# Patient Record
Sex: Male | Born: 1940
Health system: Southern US, Community
[De-identification: ages and names within clinical notes are randomized; demographics above are authoritative.]

## PROBLEM LIST (undated history)

## (undated) ENCOUNTER — Emergency Department (HOSPITAL_COMMUNITY): Disposition: A | Discharge status: Home / Self Care | Payer: Medicare HMO

## (undated) DIAGNOSIS — N323 Diverticulum of bladder: Secondary | ICD-10-CM

## (undated) DIAGNOSIS — R945 Abnormal results of liver function studies: Secondary | ICD-10-CM

## (undated) DIAGNOSIS — R7989 Other specified abnormal findings of blood chemistry: Secondary | ICD-10-CM

## (undated) DIAGNOSIS — N400 Enlarged prostate without lower urinary tract symptoms: Secondary | ICD-10-CM

## (undated) DIAGNOSIS — K760 Fatty (change of) liver, not elsewhere classified: Secondary | ICD-10-CM

## (undated) DIAGNOSIS — I10 Essential (primary) hypertension: Secondary | ICD-10-CM

## (undated) DIAGNOSIS — N529 Male erectile dysfunction, unspecified: Secondary | ICD-10-CM

## (undated) DIAGNOSIS — C439 Malignant melanoma of skin, unspecified: Secondary | ICD-10-CM

## (undated) DIAGNOSIS — R319 Hematuria, unspecified: Secondary | ICD-10-CM

## (undated) DIAGNOSIS — F1020 Alcohol dependence, uncomplicated: Secondary | ICD-10-CM

## (undated) DIAGNOSIS — L719 Rosacea, unspecified: Secondary | ICD-10-CM

## (undated) DIAGNOSIS — Z86010 Personal history of colonic polyps: Secondary | ICD-10-CM

## (undated) DIAGNOSIS — I4821 Permanent atrial fibrillation: Secondary | ICD-10-CM

## (undated) DIAGNOSIS — Z8719 Personal history of other diseases of the digestive system: Secondary | ICD-10-CM

## (undated) DIAGNOSIS — Z8673 Personal history of transient ischemic attack (TIA), and cerebral infarction without residual deficits: Secondary | ICD-10-CM

## (undated) DIAGNOSIS — I499 Cardiac arrhythmia, unspecified: Secondary | ICD-10-CM

## (undated) DIAGNOSIS — K7030 Alcoholic cirrhosis of liver without ascites: Secondary | ICD-10-CM

## (undated) DIAGNOSIS — K8020 Calculus of gallbladder without cholecystitis without obstruction: Secondary | ICD-10-CM

## (undated) DIAGNOSIS — E785 Hyperlipidemia, unspecified: Secondary | ICD-10-CM

## (undated) DIAGNOSIS — D649 Anemia, unspecified: Secondary | ICD-10-CM

## (undated) DIAGNOSIS — K769 Liver disease, unspecified: Secondary | ICD-10-CM

## (undated) DIAGNOSIS — Z7901 Long term (current) use of anticoagulants: Secondary | ICD-10-CM

## (undated) DIAGNOSIS — M1990 Unspecified osteoarthritis, unspecified site: Secondary | ICD-10-CM

## (undated) HISTORY — DX: Essential (primary) hypertension: I10

## (undated) HISTORY — DX: Calculus of gallbladder without cholecystitis without obstruction: K80.20

## (undated) HISTORY — DX: Liver disease, unspecified: K76.9

## (undated) HISTORY — DX: Alcoholic cirrhosis of liver without ascites: K70.30

## (undated) HISTORY — DX: Malignant melanoma of skin, unspecified: C43.9

## (undated) HISTORY — DX: Personal history of colonic polyps: Z86.010

## (undated) HISTORY — DX: Alcohol dependence, uncomplicated: F10.20

## (undated) HISTORY — DX: Hyperlipidemia, unspecified: E78.5

---

## 1998-09-19 ENCOUNTER — Ambulatory Visit (HOSPITAL_COMMUNITY): Admission: RE | Admit: 1998-09-19 | Discharge: 1998-09-19 | Payer: Self-pay | Admitting: Cardiology

## 1998-09-19 ENCOUNTER — Encounter: Payer: Self-pay | Admitting: Cardiology

## 1998-09-19 HISTORY — PX: CARDIAC CATHETERIZATION: SHX172

## 2001-01-09 ENCOUNTER — Encounter: Payer: Self-pay | Admitting: Gastroenterology

## 2001-01-13 ENCOUNTER — Encounter: Payer: Self-pay | Admitting: *Deleted

## 2001-01-13 ENCOUNTER — Encounter: Payer: Self-pay | Admitting: Gastroenterology

## 2001-01-13 ENCOUNTER — Encounter: Admission: RE | Admit: 2001-01-13 | Discharge: 2001-01-13 | Payer: Self-pay | Admitting: *Deleted

## 2001-01-14 ENCOUNTER — Ambulatory Visit (HOSPITAL_BASED_OUTPATIENT_CLINIC_OR_DEPARTMENT_OTHER): Admission: RE | Admit: 2001-01-14 | Discharge: 2001-01-14 | Payer: Self-pay | Admitting: *Deleted

## 2001-01-14 HISTORY — PX: INGUINAL HERNIA REPAIR: SHX194

## 2001-01-21 ENCOUNTER — Encounter: Payer: Self-pay | Admitting: Gastroenterology

## 2004-08-24 ENCOUNTER — Ambulatory Visit: Payer: Self-pay | Admitting: Internal Medicine

## 2004-10-02 ENCOUNTER — Ambulatory Visit: Payer: Self-pay | Admitting: Endocrinology

## 2004-11-01 ENCOUNTER — Ambulatory Visit: Payer: Self-pay | Admitting: Endocrinology

## 2004-11-23 ENCOUNTER — Ambulatory Visit: Payer: Self-pay | Admitting: Endocrinology

## 2005-01-22 ENCOUNTER — Ambulatory Visit: Payer: Self-pay | Admitting: Endocrinology

## 2006-02-21 ENCOUNTER — Ambulatory Visit: Payer: Self-pay | Admitting: Endocrinology

## 2006-02-27 ENCOUNTER — Ambulatory Visit: Payer: Self-pay | Admitting: Endocrinology

## 2006-03-21 ENCOUNTER — Ambulatory Visit: Payer: Self-pay | Admitting: Internal Medicine

## 2006-09-03 HISTORY — PX: CATARACT EXTRACTION W/ INTRAOCULAR LENS  IMPLANT, BILATERAL: SHX1307

## 2006-11-06 ENCOUNTER — Ambulatory Visit: Payer: Self-pay | Admitting: Gastroenterology

## 2006-11-06 LAB — CONVERTED CEMR LAB
AFP-Tumor Marker: 3.9 ng/mL (ref 0.0–8.0)
Anti Nuclear Antibody(ANA): NEGATIVE
Hepatitis B Surface Ag: NEGATIVE

## 2006-11-12 ENCOUNTER — Encounter (INDEPENDENT_AMBULATORY_CARE_PROVIDER_SITE_OTHER): Payer: Self-pay | Admitting: Gastroenterology

## 2006-11-12 ENCOUNTER — Ambulatory Visit (HOSPITAL_COMMUNITY): Admission: RE | Admit: 2006-11-12 | Discharge: 2006-11-12 | Payer: Self-pay | Admitting: Gastroenterology

## 2006-11-13 ENCOUNTER — Ambulatory Visit: Payer: Self-pay | Admitting: Gastroenterology

## 2006-11-13 LAB — CONVERTED CEMR LAB
AST: 132 units/L — ABNORMAL HIGH (ref 0–37)
Albumin: 4.1 g/dL (ref 3.5–5.2)
Prothrombin Time: 12.4 s (ref 10.0–14.0)

## 2006-11-20 ENCOUNTER — Encounter (INDEPENDENT_AMBULATORY_CARE_PROVIDER_SITE_OTHER): Payer: Self-pay | Admitting: Specialist

## 2006-11-20 ENCOUNTER — Ambulatory Visit (HOSPITAL_COMMUNITY): Admission: RE | Admit: 2006-11-20 | Discharge: 2006-11-20 | Payer: Self-pay | Admitting: Gastroenterology

## 2006-11-21 ENCOUNTER — Encounter (INDEPENDENT_AMBULATORY_CARE_PROVIDER_SITE_OTHER): Payer: Self-pay | Admitting: Gastroenterology

## 2006-12-06 ENCOUNTER — Ambulatory Visit: Payer: Self-pay | Admitting: Gastroenterology

## 2006-12-27 ENCOUNTER — Ambulatory Visit: Payer: Self-pay | Admitting: Gastroenterology

## 2007-05-14 ENCOUNTER — Encounter: Payer: Self-pay | Admitting: Endocrinology

## 2007-05-14 DIAGNOSIS — M109 Gout, unspecified: Secondary | ICD-10-CM | POA: Insufficient documentation

## 2007-05-14 DIAGNOSIS — I4891 Unspecified atrial fibrillation: Secondary | ICD-10-CM | POA: Insufficient documentation

## 2007-05-15 ENCOUNTER — Ambulatory Visit: Payer: Self-pay | Admitting: Endocrinology

## 2007-05-15 LAB — CONVERTED CEMR LAB
Albumin: 4.4 g/dL (ref 3.5–5.2)
Alkaline Phosphatase: 49 units/L (ref 39–117)
BUN: 11 mg/dL (ref 6–23)
Bacteria, UA: NEGATIVE
Basophils Relative: 0.9 % (ref 0.0–1.0)
Cholesterol: 226 mg/dL (ref 0–200)
Creatinine, Ser: 0.8 mg/dL (ref 0.4–1.5)
Crystals: NEGATIVE
Direct LDL: 166.4 mg/dL
GFR calc non Af Amer: 103 mL/min
HDL: 58 mg/dL (ref >39.0)
Monocytes Relative: 10.9 % (ref 3.0–11.0)
PSA: 1.35 ng/mL (ref 0.10–4.00)
Platelets: 213 10*3/uL (ref 150–400)
Potassium: 4.1 meq/L (ref 3.5–5.1)
RBC / HPF: NONE SEEN
RDW: 13.2 % (ref 11.5–14.6)
Specific Gravity, Urine: 1.015 (ref 1.000–1.03)
TSH: 1.78 microintl units/mL (ref 0.35–5.50)
Total Bilirubin: 1.3 mg/dL — ABNORMAL HIGH (ref 0.3–1.2)
Total CHOL/HDL Ratio: 3.9
Urine Glucose: NEGATIVE mg/dL
Urobilinogen, UA: 4 — AB (ref 0.0–1.0)
VLDL: 14 mg/dL (ref 0–40)
pH: 7.5 (ref 5.0–8.0)

## 2007-05-27 ENCOUNTER — Encounter: Payer: Self-pay | Admitting: Endocrinology

## 2007-05-27 LAB — CONVERTED CEMR LAB
Catecholamines Tot(E+NE) 24 Hr U: 0.043 mg/24hr
Epinephrine 24 Hr Urine: 3 mcg/24hr (ref <20)
Metanephrines, Ur: 87 (ref 26–230)

## 2007-07-10 ENCOUNTER — Ambulatory Visit: Payer: Self-pay | Admitting: Internal Medicine

## 2007-08-06 ENCOUNTER — Ambulatory Visit: Payer: Self-pay | Admitting: Internal Medicine

## 2007-08-06 DIAGNOSIS — F1020 Alcohol dependence, uncomplicated: Secondary | ICD-10-CM | POA: Insufficient documentation

## 2007-08-06 DIAGNOSIS — F1010 Alcohol abuse, uncomplicated: Secondary | ICD-10-CM

## 2007-08-06 DIAGNOSIS — E785 Hyperlipidemia, unspecified: Secondary | ICD-10-CM | POA: Insufficient documentation

## 2007-08-06 DIAGNOSIS — R1013 Epigastric pain: Secondary | ICD-10-CM | POA: Insufficient documentation

## 2007-08-06 DIAGNOSIS — I10 Essential (primary) hypertension: Secondary | ICD-10-CM | POA: Insufficient documentation

## 2007-08-08 ENCOUNTER — Ambulatory Visit: Payer: Self-pay | Admitting: Internal Medicine

## 2007-08-09 ENCOUNTER — Ambulatory Visit: Payer: Self-pay | Admitting: Family Medicine

## 2007-08-09 DIAGNOSIS — J189 Pneumonia, unspecified organism: Secondary | ICD-10-CM | POA: Insufficient documentation

## 2007-08-13 ENCOUNTER — Ambulatory Visit: Payer: Self-pay | Admitting: Internal Medicine

## 2008-01-05 ENCOUNTER — Encounter: Payer: Self-pay | Admitting: Endocrinology

## 2008-06-03 ENCOUNTER — Ambulatory Visit: Payer: Self-pay | Admitting: Endocrinology

## 2008-06-03 DIAGNOSIS — L259 Unspecified contact dermatitis, unspecified cause: Secondary | ICD-10-CM | POA: Insufficient documentation

## 2008-06-04 LAB — CONVERTED CEMR LAB
Alkaline Phosphatase: 53 units/L (ref 39–117)
Bilirubin, Direct: 0.3 mg/dL (ref 0.0–0.3)
Calcium: 9.6 mg/dL (ref 8.4–10.5)
Cholesterol: 260 mg/dL (ref 0–200)
Crystals: NEGATIVE
Eosinophils Relative: 2.1 % (ref 0.0–5.0)
GFR calc Af Amer: 109 mL/min
Glucose, Bld: 107 mg/dL — ABNORMAL HIGH (ref 70–99)
HDL: 59.7 mg/dL (ref >39.0)
Hemoglobin, Urine: NEGATIVE
Monocytes Relative: 8.9 % (ref 3.0–12.0)
Neutrophils Relative %: 69.2 % (ref 43.0–77.0)
Nitrite: NEGATIVE
PSA: 1.45 ng/mL (ref 0.10–4.00)
Platelets: 220 10*3/uL (ref 150–400)
Potassium: 4.2 meq/L (ref 3.5–5.1)
RBC / HPF: NONE SEEN
RDW: 13.1 % (ref 11.5–14.6)
Sodium: 141 meq/L (ref 135–145)
Specific Gravity, Urine: 1.015 (ref 1.000–1.03)
TSH: 1.7 microintl units/mL (ref 0.35–5.50)
Total Bilirubin: 1.7 mg/dL — ABNORMAL HIGH (ref 0.3–1.2)
Total CHOL/HDL Ratio: 4.4
Total Protein: 7.7 g/dL (ref 6.0–8.3)
Triglycerides: 112 mg/dL (ref 0–149)
Uric Acid, Serum: 3.7 mg/dL — ABNORMAL LOW (ref 4.0–7.8)
Urine Glucose: NEGATIVE mg/dL
Urobilinogen, UA: 1 (ref 0.0–1.0)
VLDL: 22 mg/dL (ref 0–40)
WBC, UA: NONE SEEN cells/hpf
WBC: 6.5 10*3/uL (ref 4.5–10.5)

## 2008-06-07 ENCOUNTER — Encounter: Payer: Self-pay | Admitting: Endocrinology

## 2008-06-08 ENCOUNTER — Encounter: Payer: Self-pay | Admitting: Endocrinology

## 2008-07-01 ENCOUNTER — Ambulatory Visit: Payer: Self-pay | Admitting: Gastroenterology

## 2008-07-12 ENCOUNTER — Encounter: Payer: Self-pay | Admitting: Gastroenterology

## 2008-07-12 ENCOUNTER — Ambulatory Visit: Payer: Self-pay | Admitting: Gastroenterology

## 2008-07-12 DIAGNOSIS — Z860100 Personal history of colon polyps, unspecified: Secondary | ICD-10-CM

## 2008-07-12 HISTORY — DX: Personal history of colonic polyps: Z86.010

## 2008-07-12 HISTORY — DX: Personal history of colon polyps, unspecified: Z86.0100

## 2008-07-13 ENCOUNTER — Encounter: Payer: Self-pay | Admitting: Gastroenterology

## 2008-08-23 ENCOUNTER — Encounter: Payer: Self-pay | Admitting: Gastroenterology

## 2008-09-13 ENCOUNTER — Telehealth: Payer: Self-pay | Admitting: Gastroenterology

## 2008-09-16 ENCOUNTER — Ambulatory Visit: Payer: Self-pay | Admitting: Internal Medicine

## 2008-09-21 ENCOUNTER — Ambulatory Visit (HOSPITAL_COMMUNITY): Admission: RE | Admit: 2008-09-21 | Discharge: 2008-09-21 | Payer: Self-pay | Admitting: Gastroenterology

## 2008-09-24 ENCOUNTER — Encounter: Payer: Self-pay | Admitting: Gastroenterology

## 2008-10-06 ENCOUNTER — Telehealth: Payer: Self-pay | Admitting: Gastroenterology

## 2008-10-06 ENCOUNTER — Telehealth (INDEPENDENT_AMBULATORY_CARE_PROVIDER_SITE_OTHER): Payer: Self-pay | Admitting: *Deleted

## 2008-10-06 DIAGNOSIS — Z860100 Personal history of colon polyps, unspecified: Secondary | ICD-10-CM | POA: Insufficient documentation

## 2008-10-06 DIAGNOSIS — K649 Unspecified hemorrhoids: Secondary | ICD-10-CM | POA: Insufficient documentation

## 2008-10-07 ENCOUNTER — Encounter: Payer: Self-pay | Admitting: Endocrinology

## 2008-10-07 ENCOUNTER — Ambulatory Visit: Payer: Self-pay | Admitting: Gastroenterology

## 2008-10-07 DIAGNOSIS — R1032 Left lower quadrant pain: Secondary | ICD-10-CM | POA: Insufficient documentation

## 2008-10-07 DIAGNOSIS — K4190 Unilateral femoral hernia, without obstruction or gangrene, not specified as recurrent: Secondary | ICD-10-CM | POA: Insufficient documentation

## 2008-10-07 LAB — CONVERTED CEMR LAB
ALT: 275 units/L — ABNORMAL HIGH (ref 0–53)
AST: 296 units/L — ABNORMAL HIGH (ref 0–37)
Albumin: 3.9 g/dL (ref 3.5–5.2)
Alkaline Phosphatase: 88 units/L (ref 39–117)
Ammonia: 46 umol/L — ABNORMAL HIGH (ref 11–35)
BUN: 17 mg/dL (ref 6–23)
Chloride: 93 meq/L — ABNORMAL LOW (ref 96–112)
Creatinine, Ser: 1.2 mg/dL (ref 0.4–1.5)
Eosinophils Relative: 0 % (ref 0.0–5.0)
Ferritin: >1500 ng/mL — ABNORMAL HIGH (ref 22.0–322.0)
Folate: 9.2 ng/mL
GFR calc non Af Amer: 64 mL/min
Glucose, Bld: 144 mg/dL — ABNORMAL HIGH (ref 70–99)
INR: 1.4 — ABNORMAL HIGH (ref 0.8–1.0)
Lymphocytes Relative: 2.4 % — ABNORMAL LOW (ref 12.0–46.0)
Monocytes Absolute: 0.4 10*3/uL (ref 0.1–1.0)
Monocytes Relative: 3.7 % (ref 3.0–12.0)
Neutrophils Relative %: 93.9 % — ABNORMAL HIGH (ref 43.0–77.0)
Platelets: 134 10*3/uL — ABNORMAL LOW (ref 150–400)
Potassium: 3.6 meq/L (ref 3.5–5.1)
Prothrombin Time: 14.6 s — ABNORMAL HIGH (ref 10.9–13.3)
RDW: 13.3 % (ref 11.5–14.6)
Sed Rate: 20 mm/hr — ABNORMAL HIGH (ref 0–16)
Sodium: 134 meq/L — ABNORMAL LOW (ref 135–145)
TSH: 1.13 microintl units/mL (ref 0.35–5.50)
WBC: 11.5 10*3/uL — ABNORMAL HIGH (ref 4.5–10.5)

## 2008-10-08 ENCOUNTER — Ambulatory Visit: Payer: Self-pay | Admitting: Gastroenterology

## 2008-10-08 ENCOUNTER — Inpatient Hospital Stay (HOSPITAL_COMMUNITY): Admission: AD | Admit: 2008-10-08 | Discharge: 2008-10-13 | Payer: Self-pay | Admitting: Gastroenterology

## 2008-10-12 ENCOUNTER — Encounter: Payer: Self-pay | Admitting: Gastroenterology

## 2008-10-14 ENCOUNTER — Encounter: Payer: Self-pay | Admitting: Endocrinology

## 2008-10-18 ENCOUNTER — Telehealth: Payer: Self-pay | Admitting: Gastroenterology

## 2008-10-20 ENCOUNTER — Ambulatory Visit: Payer: Self-pay | Admitting: Gastroenterology

## 2008-10-20 LAB — CONVERTED CEMR LAB
AST: 60 units/L — ABNORMAL HIGH (ref 0–37)
Alkaline Phosphatase: 73 units/L (ref 39–117)
Basophils Absolute: 0 10*3/uL (ref 0.0–0.1)
Lymphocytes Relative: 16.8 % (ref 12.0–46.0)
MCHC: 34.2 g/dL (ref 30.0–36.0)
Monocytes Relative: 8.4 % (ref 3.0–12.0)
Neutrophils Relative %: 73.4 % (ref 43.0–77.0)
RBC: 3.09 M/uL — ABNORMAL LOW (ref 4.22–5.81)
RDW: 12.9 % (ref 11.5–14.6)
Total Bilirubin: 1.6 mg/dL — ABNORMAL HIGH (ref 0.3–1.2)

## 2008-10-21 ENCOUNTER — Ambulatory Visit: Payer: Self-pay | Admitting: Gastroenterology

## 2008-10-21 ENCOUNTER — Encounter (INDEPENDENT_AMBULATORY_CARE_PROVIDER_SITE_OTHER): Payer: Self-pay | Admitting: Surgery

## 2008-10-21 ENCOUNTER — Ambulatory Visit (HOSPITAL_COMMUNITY): Admission: RE | Admit: 2008-10-21 | Discharge: 2008-10-22 | Payer: Self-pay | Admitting: Surgery

## 2008-10-21 DIAGNOSIS — K7030 Alcoholic cirrhosis of liver without ascites: Secondary | ICD-10-CM | POA: Insufficient documentation

## 2008-10-21 DIAGNOSIS — K8050 Calculus of bile duct without cholangitis or cholecystitis without obstruction: Secondary | ICD-10-CM | POA: Insufficient documentation

## 2008-10-21 HISTORY — PX: LAPAROSCOPIC CHOLECYSTECTOMY: SUR755

## 2008-11-02 LAB — CONVERTED CEMR LAB
BUN: 11 mg/dL (ref 6–23)
Creatinine, Ser: 0.8 mg/dL (ref 0.4–1.5)
Iron: 140 ug/dL (ref 42–165)

## 2008-11-03 ENCOUNTER — Ambulatory Visit: Payer: Self-pay | Admitting: Gastroenterology

## 2008-11-03 ENCOUNTER — Encounter: Payer: Self-pay | Admitting: Endocrinology

## 2008-11-03 LAB — CONVERTED CEMR LAB
ALT: 26 units/L (ref 0–53)
AST: 31 units/L (ref 0–37)
Alkaline Phosphatase: 80 units/L (ref 39–117)
Bilirubin, Direct: 0.4 mg/dL — ABNORMAL HIGH (ref 0.0–0.3)
Total Protein: 7.2 g/dL (ref 6.0–8.3)

## 2008-11-04 ENCOUNTER — Ambulatory Visit: Payer: Self-pay | Admitting: Gastroenterology

## 2008-11-04 DIAGNOSIS — Z87898 Personal history of other specified conditions: Secondary | ICD-10-CM | POA: Insufficient documentation

## 2008-11-04 DIAGNOSIS — N400 Enlarged prostate without lower urinary tract symptoms: Secondary | ICD-10-CM | POA: Insufficient documentation

## 2008-11-08 ENCOUNTER — Telehealth: Payer: Self-pay | Admitting: Gastroenterology

## 2008-11-18 ENCOUNTER — Encounter: Payer: Self-pay | Admitting: Endocrinology

## 2008-11-19 ENCOUNTER — Ambulatory Visit (HOSPITAL_COMMUNITY): Admission: RE | Admit: 2008-11-19 | Discharge: 2008-11-20 | Payer: Self-pay | Admitting: Surgery

## 2008-12-02 ENCOUNTER — Ambulatory Visit: Payer: Self-pay | Admitting: Gastroenterology

## 2008-12-02 ENCOUNTER — Telehealth: Payer: Self-pay | Admitting: Gastroenterology

## 2008-12-02 ENCOUNTER — Ambulatory Visit (HOSPITAL_COMMUNITY): Admission: RE | Admit: 2008-12-02 | Discharge: 2008-12-02 | Payer: Self-pay | Admitting: Gastroenterology

## 2008-12-02 LAB — CONVERTED CEMR LAB
Albumin: 4.2 g/dL (ref 3.5–5.2)
Alkaline Phosphatase: 60 units/L (ref 39–117)
Basophils Relative: 1.1 % (ref 0.0–3.0)
CO2: 29 meq/L (ref 19–32)
Chloride: 103 meq/L (ref 96–112)
Eosinophils Absolute: 0.2 10*3/uL (ref 0.0–0.7)
Ferritin: 114.6 ng/mL (ref 22.0–322.0)
MCHC: 33.2 g/dL (ref 30.0–36.0)
MCV: 102.6 fL — ABNORMAL HIGH (ref 78.0–100.0)
Monocytes Absolute: 0.6 10*3/uL (ref 0.1–1.0)
Neutrophils Relative %: 69.8 % (ref 43.0–77.0)
Platelets: 300 10*3/uL (ref 150.0–400.0)
RDW: 13.9 % (ref 11.5–14.6)
Saturation Ratios: 9.6 % — ABNORMAL LOW (ref 20.0–50.0)
Vitamin B-12: 344 pg/mL (ref 211–911)

## 2008-12-07 ENCOUNTER — Ambulatory Visit: Payer: Self-pay | Admitting: Gastroenterology

## 2008-12-07 DIAGNOSIS — K219 Gastro-esophageal reflux disease without esophagitis: Secondary | ICD-10-CM | POA: Insufficient documentation

## 2008-12-10 ENCOUNTER — Encounter: Payer: Self-pay | Admitting: Endocrinology

## 2008-12-30 ENCOUNTER — Ambulatory Visit: Payer: Self-pay | Admitting: Gastroenterology

## 2008-12-31 LAB — CONVERTED CEMR LAB
ALT: 15 units/L (ref 0–53)
AST: 25 units/L (ref 0–37)
Albumin: 4.1 g/dL (ref 3.5–5.2)
Alkaline Phosphatase: 52 units/L (ref 39–117)
Ferritin: 99.5 ng/mL (ref 22.0–322.0)
Total Protein: 7.3 g/dL (ref 6.0–8.3)
Transferrin: 240.1 mg/dL (ref 212.0–360.0)
Vitamin B-12: 433 pg/mL (ref 211–911)

## 2009-03-15 ENCOUNTER — Ambulatory Visit: Payer: Self-pay | Admitting: Gastroenterology

## 2009-04-15 ENCOUNTER — Ambulatory Visit: Payer: Self-pay | Admitting: Cardiovascular Disease

## 2009-04-15 ENCOUNTER — Encounter (INDEPENDENT_AMBULATORY_CARE_PROVIDER_SITE_OTHER): Payer: Self-pay | Admitting: Neurology

## 2009-04-15 ENCOUNTER — Emergency Department (HOSPITAL_COMMUNITY): Admission: EM | Admit: 2009-04-15 | Discharge: 2009-04-15 | Payer: Self-pay | Admitting: Emergency Medicine

## 2009-04-18 ENCOUNTER — Telehealth: Payer: Self-pay | Admitting: Cardiovascular Disease

## 2009-04-20 IMAGING — US US ABDOMEN LIMITED
1 series · 14 of 18 positions shown · non-contrast
Comparison: None

CLINICAL DATA: History given of ascites and right lower quadrant
fullness in the area of previous surgery.

LIMITED ABDOMINAL ULTRASOUND - RIGHT LOWER QUADRANT

[Series 1: us abdomen limited · 0.28mm/px · 14 of 18 slices shown]
[im 1/18]
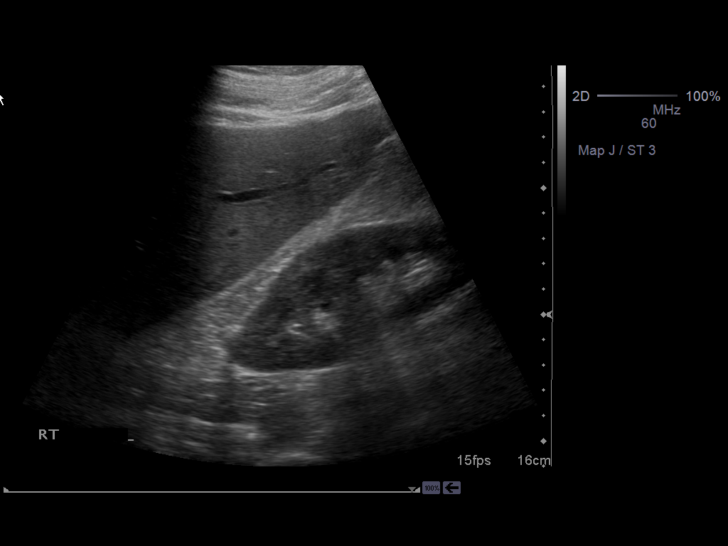
[im 2/18]
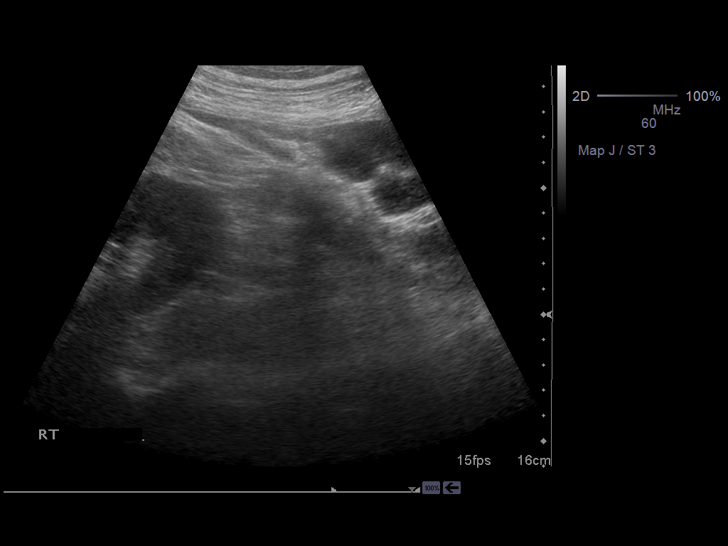
[im 4/18]
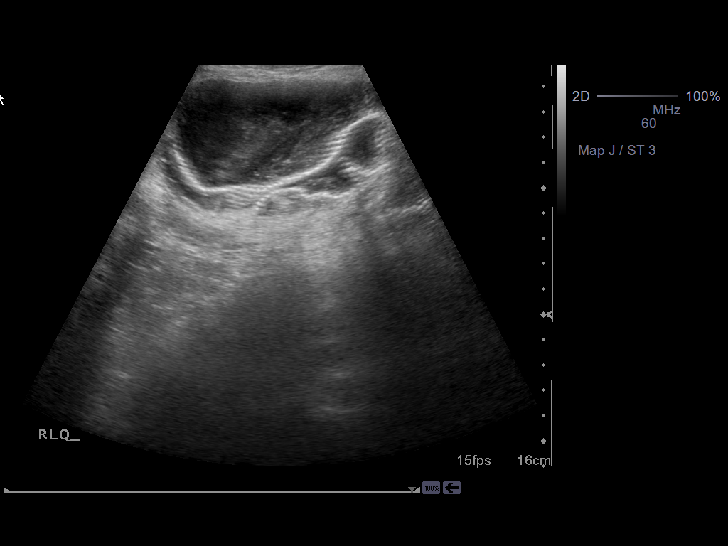
[im 5/18]
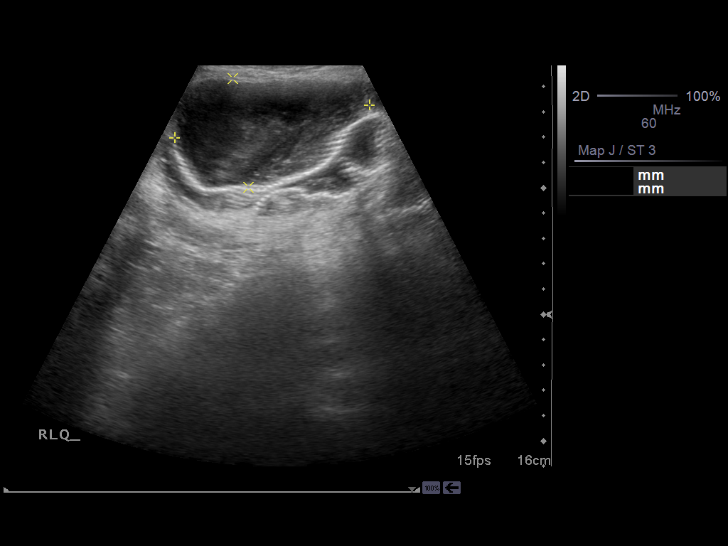
[im 6/18]
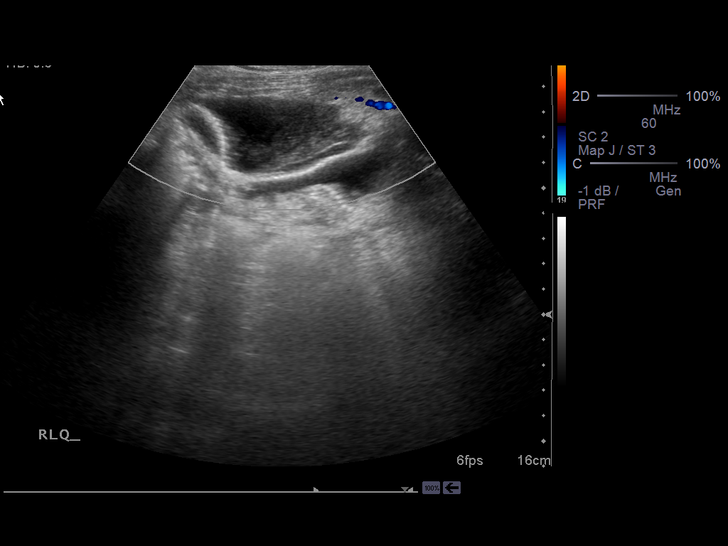
[im 8/18]
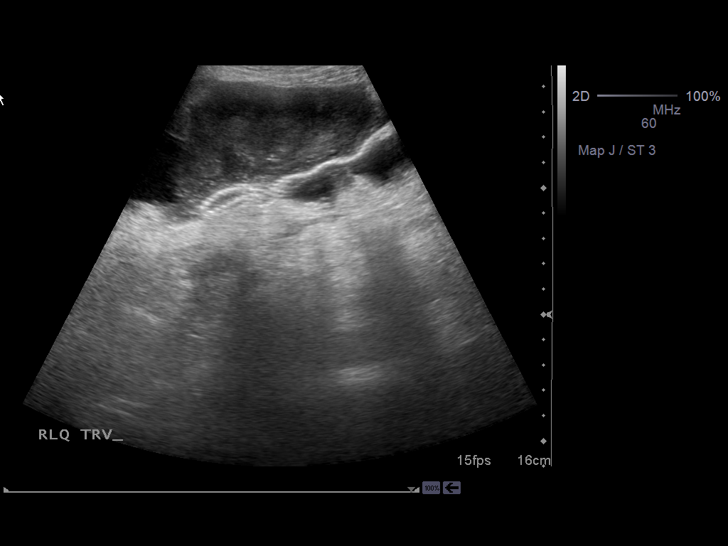
[im 9/18]
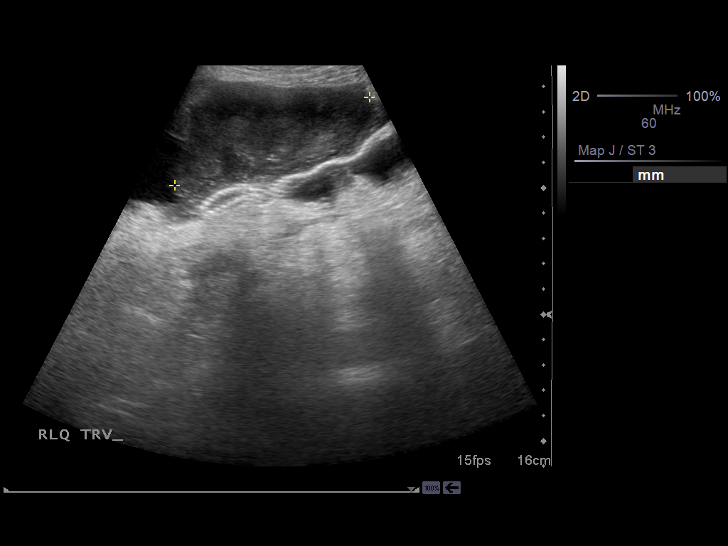
[im 10/18]
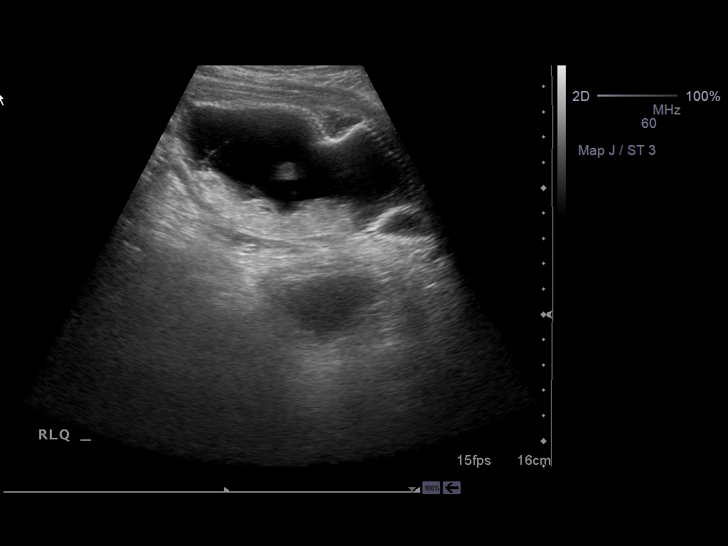
[im 11/18]
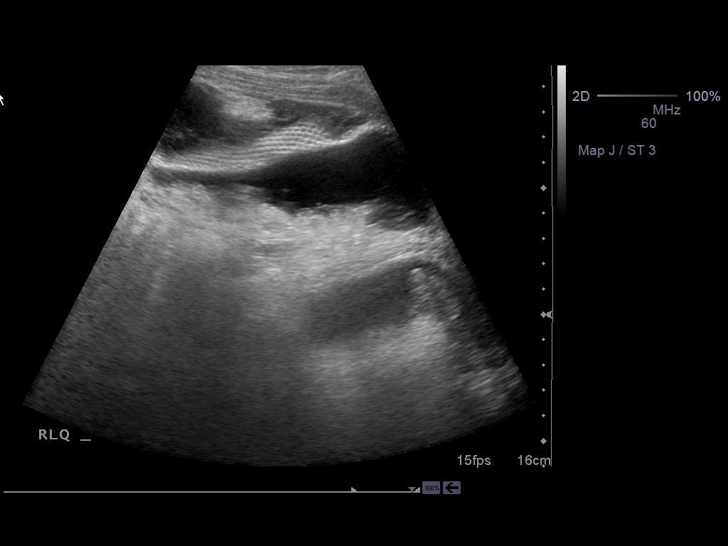
[im 13/18]
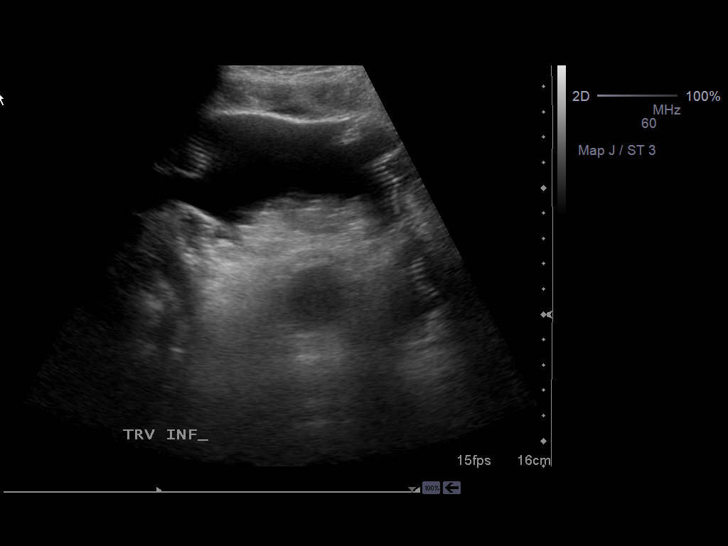
[im 14/18]
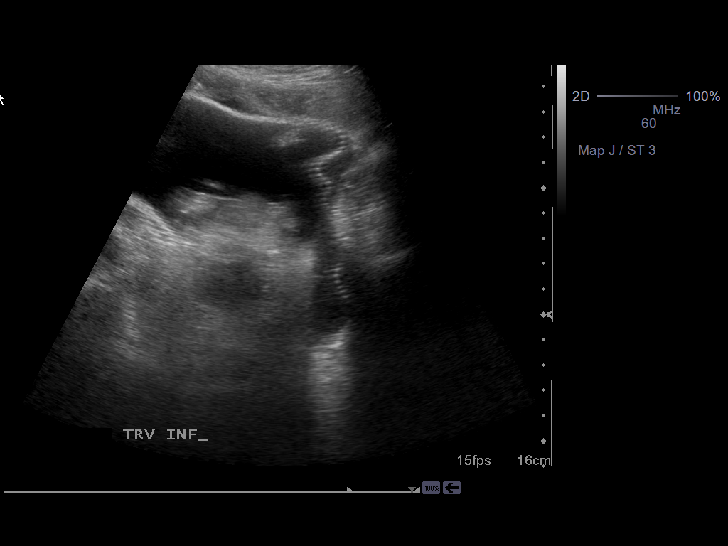
[im 15/18]
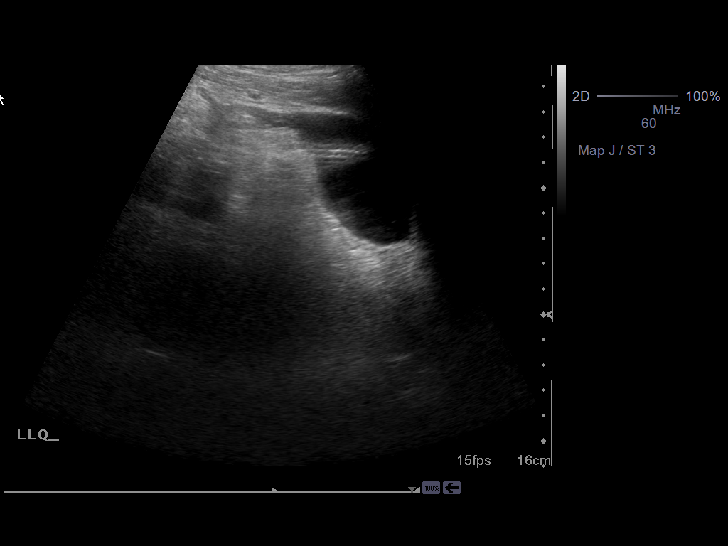
[im 17/18]
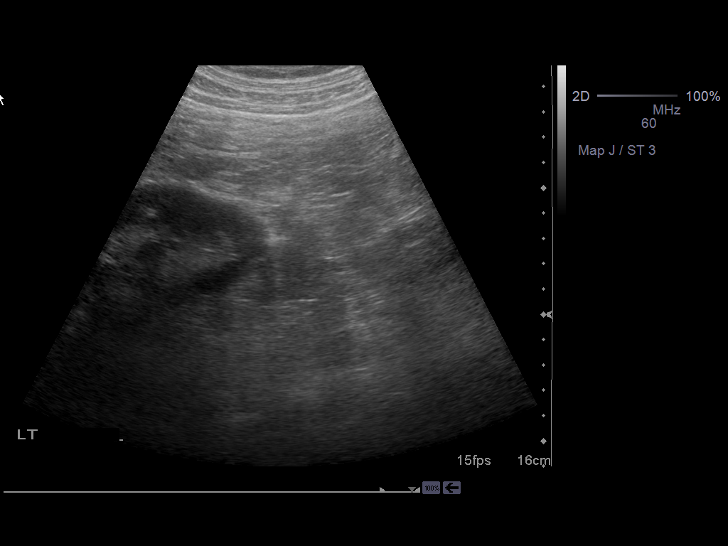
[im 18/18]
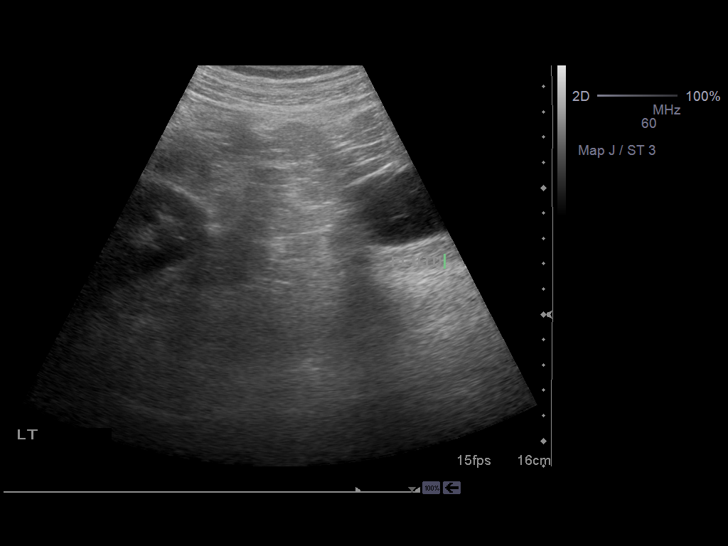

[14 of 18 positions shown; findings below may reference images not displayed]

FINDINGS: There is a small amount of ascites.  In the right lower
quadrant the mesh from the previous surgery can be identified.
Superficial to the mesh is an oval structure.  The structure
measures 8.4 by 7.8 x 4.3 cm.  It has some internal echoes but it
is hypoechoic.  With color Doppler examination no internal flow
could be identified.
IMPRESSION: There is a small amount of ascites.

In the right lower quadrant the mesh from the previous surgery can
be identified.  Superficial to this is an oval hypoechoic structure
with some internal echoes but no internal color Doppler flow.  Most
likely etiology of this structure with the postoperative history in
this area is hematoma or seroma.  No hyperemia was identified with
color Doppler examination which would argue against abscess or
inflammatory mass.

## 2009-04-21 ENCOUNTER — Ambulatory Visit: Payer: Self-pay | Admitting: Cardiology

## 2009-04-21 ENCOUNTER — Encounter (INDEPENDENT_AMBULATORY_CARE_PROVIDER_SITE_OTHER): Payer: Self-pay | Admitting: Cardiology

## 2009-04-21 LAB — CONVERTED CEMR LAB: POC INR: 1.1

## 2009-04-25 ENCOUNTER — Ambulatory Visit: Payer: Self-pay | Admitting: Cardiovascular Disease

## 2009-04-25 ENCOUNTER — Encounter (INDEPENDENT_AMBULATORY_CARE_PROVIDER_SITE_OTHER): Payer: Self-pay | Admitting: Cardiology

## 2009-04-28 ENCOUNTER — Ambulatory Visit: Payer: Self-pay | Admitting: Cardiovascular Disease

## 2009-05-04 ENCOUNTER — Ambulatory Visit: Payer: Self-pay | Admitting: Internal Medicine

## 2009-05-11 ENCOUNTER — Ambulatory Visit: Payer: Self-pay | Admitting: Cardiology

## 2009-05-17 ENCOUNTER — Ambulatory Visit: Payer: Self-pay | Admitting: Cardiology

## 2009-05-17 ENCOUNTER — Ambulatory Visit: Payer: Self-pay | Admitting: Internal Medicine

## 2009-05-17 LAB — CONVERTED CEMR LAB: POC INR: 2.5

## 2009-05-22 HISTORY — PX: LAPAROSCOPIC INGUINAL HERNIA REPAIR: SUR788

## 2009-05-31 ENCOUNTER — Ambulatory Visit: Payer: Self-pay | Admitting: Cardiology

## 2009-05-31 LAB — CONVERTED CEMR LAB: POC INR: 2.4

## 2009-06-16 ENCOUNTER — Ambulatory Visit: Payer: Self-pay | Admitting: Cardiology

## 2009-06-16 LAB — CONVERTED CEMR LAB: POC INR: 2.1

## 2009-07-15 ENCOUNTER — Ambulatory Visit: Payer: Self-pay | Admitting: Cardiology

## 2009-07-15 LAB — CONVERTED CEMR LAB: POC INR: 2

## 2009-07-18 ENCOUNTER — Telehealth: Payer: Self-pay | Admitting: Endocrinology

## 2009-07-29 ENCOUNTER — Telehealth (INDEPENDENT_AMBULATORY_CARE_PROVIDER_SITE_OTHER): Payer: Self-pay | Admitting: *Deleted

## 2009-08-08 ENCOUNTER — Ambulatory Visit: Payer: Self-pay | Admitting: Gastroenterology

## 2009-08-08 LAB — CONVERTED CEMR LAB
AST: 30 units/L (ref 0–37)
Albumin: 4.4 g/dL (ref 3.5–5.2)
Alkaline Phosphatase: 53 units/L (ref 39–117)
Basophils Absolute: 0 10*3/uL (ref 0.0–0.1)
Bilirubin, Direct: 0.3 mg/dL (ref 0.0–0.3)
CO2: 31 meq/L (ref 19–32)
Eosinophils Relative: 3.6 % (ref 0.0–5.0)
GFR calc non Af Amer: 89.25 mL/min (ref >60)
Glucose, Bld: 108 mg/dL — ABNORMAL HIGH (ref 70–99)
Monocytes Relative: 11.2 % (ref 3.0–12.0)
Neutrophils Relative %: 60.4 % (ref 43.0–77.0)
Platelets: 176 10*3/uL (ref 150.0–400.0)
Potassium: 4.4 meq/L (ref 3.5–5.1)
Sodium: 136 meq/L (ref 135–145)
Total Protein: 7 g/dL (ref 6.0–8.3)
WBC: 5.1 10*3/uL (ref 4.5–10.5)

## 2009-08-09 ENCOUNTER — Ambulatory Visit: Payer: Self-pay | Admitting: Endocrinology

## 2009-08-09 DIAGNOSIS — G459 Transient cerebral ischemic attack, unspecified: Secondary | ICD-10-CM | POA: Insufficient documentation

## 2009-08-12 ENCOUNTER — Ambulatory Visit: Payer: Self-pay | Admitting: Cardiology

## 2009-08-15 ENCOUNTER — Telehealth: Payer: Self-pay | Admitting: Endocrinology

## 2009-08-16 ENCOUNTER — Ambulatory Visit: Payer: Self-pay | Admitting: Gastroenterology

## 2009-09-06 ENCOUNTER — Ambulatory Visit: Payer: Self-pay | Admitting: Cardiology

## 2009-10-05 ENCOUNTER — Ambulatory Visit: Payer: Self-pay | Admitting: Internal Medicine

## 2009-10-05 LAB — CONVERTED CEMR LAB: POC INR: 2.3

## 2009-11-02 ENCOUNTER — Ambulatory Visit: Payer: Self-pay | Admitting: Cardiology

## 2009-11-25 ENCOUNTER — Encounter: Payer: Self-pay | Admitting: Internal Medicine

## 2009-11-25 ENCOUNTER — Ambulatory Visit: Payer: Self-pay | Admitting: Internal Medicine

## 2009-12-02 ENCOUNTER — Telehealth (INDEPENDENT_AMBULATORY_CARE_PROVIDER_SITE_OTHER): Payer: Self-pay | Admitting: *Deleted

## 2009-12-02 ENCOUNTER — Encounter (INDEPENDENT_AMBULATORY_CARE_PROVIDER_SITE_OTHER): Payer: Self-pay | Admitting: *Deleted

## 2009-12-02 ENCOUNTER — Ambulatory Visit: Payer: Self-pay | Admitting: Gastroenterology

## 2009-12-12 ENCOUNTER — Ambulatory Visit: Payer: Self-pay | Admitting: Gastroenterology

## 2010-08-16 ENCOUNTER — Telehealth: Payer: Self-pay | Admitting: Internal Medicine

## 2010-09-07 ENCOUNTER — Encounter: Payer: Self-pay | Admitting: Internal Medicine

## 2010-09-07 ENCOUNTER — Ambulatory Visit
Admission: RE | Admit: 2010-09-07 | Discharge: 2010-09-07 | Payer: Self-pay | Source: Home / Self Care | Attending: Internal Medicine | Admitting: Internal Medicine

## 2010-09-07 DIAGNOSIS — R1084 Generalized abdominal pain: Secondary | ICD-10-CM | POA: Insufficient documentation

## 2010-09-18 ENCOUNTER — Telehealth: Payer: Self-pay | Admitting: Internal Medicine

## 2010-09-24 ENCOUNTER — Encounter: Payer: Self-pay | Admitting: Gastroenterology

## 2010-09-26 ENCOUNTER — Telehealth (INDEPENDENT_AMBULATORY_CARE_PROVIDER_SITE_OTHER): Payer: Self-pay | Admitting: *Deleted

## 2010-09-27 ENCOUNTER — Ambulatory Visit
Admission: RE | Admit: 2010-09-27 | Discharge: 2010-09-27 | Payer: Self-pay | Source: Home / Self Care | Attending: Endocrinology | Admitting: Endocrinology

## 2010-09-27 ENCOUNTER — Other Ambulatory Visit: Payer: Self-pay | Admitting: Endocrinology

## 2010-09-27 LAB — HEPATIC FUNCTION PANEL
ALT: 56 U/L — ABNORMAL HIGH (ref 0–53)
AST: 51 U/L — ABNORMAL HIGH (ref 0–37)
Total Bilirubin: 1.4 mg/dL — ABNORMAL HIGH (ref 0.3–1.2)
Total Protein: 7.3 g/dL (ref 6.0–8.3)

## 2010-09-27 LAB — CBC WITH DIFFERENTIAL/PLATELET
Basophils Relative: 0.2 % (ref 0.0–3.0)
Eosinophils Relative: 2.7 % (ref 0.0–5.0)
HCT: 43.2 % (ref 39.0–52.0)
Hemoglobin: 14.8 g/dL (ref 13.0–17.0)
Lymphs Abs: 1.3 10*3/uL (ref 0.7–4.0)
Monocytes Relative: 10.9 % (ref 3.0–12.0)
Neutro Abs: 3.7 10*3/uL (ref 1.4–7.7)
Platelets: 200 10*3/uL (ref 150.0–400.0)
RBC: 4 Mil/uL — ABNORMAL LOW (ref 4.22–5.81)
WBC: 5.8 10*3/uL (ref 4.5–10.5)

## 2010-09-27 LAB — TSH: TSH: 1.08 u[IU]/mL (ref 0.35–5.50)

## 2010-09-27 LAB — LIPID PANEL
HDL: 51.8 mg/dL (ref >39.00)
Total CHOL/HDL Ratio: 4
VLDL: 21 mg/dL (ref 0.0–40.0)

## 2010-09-27 LAB — BASIC METABOLIC PANEL
GFR: 92.49 mL/min (ref >60.00)
Potassium: 4.6 mEq/L (ref 3.5–5.1)
Sodium: 140 mEq/L (ref 135–145)

## 2010-09-28 ENCOUNTER — Telehealth: Payer: Self-pay | Admitting: Internal Medicine

## 2010-10-03 NOTE — Medication Information (Signed)
Summary: rov/tm  Anticoagulant Therapy  Managed by: Bethena Midget, RN, BSN Referring MD: Calton Dach, MD PCP: Romero Belling, MD Supervising MD: Gala Romney MD, Reuel Boom Indication 1: Atrial Fibrillation Indication 2: Transcient Ischemic Attack Lab Used: [facility] Site: [address] INR POC 2.3 INR RANGE 2.0-3.0  Dietary changes: no    Health status changes: no    Bleeding/hemorrhagic complications: no    Recent/future hospitalizations: no    Any changes in medication regimen? no    Recent/future dental: no  Any missed doses?: no       Is patient compliant with meds? yes       Allergies: No Known Drug Allergies  Anticoagulation Management History:      The patient is taking warfarin and comes in today for a routine follow up visit.  Positive risk factors for bleeding include an age of 69 years or older and history of CVA/TIA.  The bleeding index is 'intermediate risk'.  Positive CHADS2 values include History of HTN and Prior Stroke/CVA/TIA.  Negative CHADS2 values include Age > 54 years old.  His last INR was 1.1 ratio.  Anticoagulation responsible provider: Milt Coye MD, Reuel Boom.  INR POC: 2.3.  Cuvette Lot#: [id number].  Exp: 12/2010.    Anticoagulation Management Assessment/Plan:      The patient's current anticoagulation dose is Coumadin 5 mg tabs: Take as directed by coumadin clinic..  The target INR is 2.0-3.0.  The next INR is due 11/02/2009.  Anticoagulation instructions were given to patient.  Results were reviewed/authorized by Bethena Midget, RN, BSN.  He was notified by Bethena Midget, RN, BSN.         Prior Anticoagulation Instructions: INR 2.2 Continue 1 tablet daily except 1.5 tablets on Tuesdays and Fridays. Recheck in 4 weeks.   Current Anticoagulation Instructions: INR 2.3 Continue 5mg s daily except 7.5mg s on Tuesdays and Fridays. Recheck in 4 weeks.

## 2010-10-03 NOTE — Progress Notes (Signed)
Summary: pt doesnt have any pills left after today/samples if not called    Phone Note Call from Patient Call back at Home Phone [phone]   Caller: pt Reason for Call: Refill Medication Summary of Call: [facility] [address] says they have been trying to reach [place] to get pradaxa filled. Pt calling to see if we can call them and take care of it. Initial call taken by: Faythe Ghee,  December 02, 2009 11:31 AM  Follow-up for Phone Call        Samples have been sent to front desk.  Pt notified.  Will work on  Authorization and get back to pt when available.  AT, CMA   Follow-up by: Judithe Modest CMA,  December 02, 2009 2:27 PM    Prescriptions: PRADAXA 150 MG CAPS (DABIGATRAN ETEXILATE MESYLATE) Take 1 capsule by mouth two times a day  #60 x 3   Entered by:   Judithe Modest CMA   Authorized by:   Nathen May, MD, [facility]   Signed by:   Judithe Modest CMA on 12/02/2009   Method used:   Electronically to        [facility]. [phone]* (retail)       [address]       [place], [place]  [address]       Ph: [phone]       Fax: [phone]   RxID:   [id number]   Appended Document: pt doesnt have any pills left after today/samples if not called  Called for prior authorization form , should receive it today by fax (pradaxa) .Marland Kitchen... Burnett Kanaris   Clinical Lists Changes

## 2010-10-03 NOTE — Medication Information (Signed)
Summary: Coumadin Clinic  Anticoagulant Therapy  Managed by: Inactive Referring MD: Calton Dach, MD PCP: Romero Belling, MD Supervising MD: Tenny Craw MD, Gunnar Fusi Indication 1: Atrial Fibrillation Indication 2: Transcient Ischemic Attack Lab Used: [facility] [place] Site: [address] INR RANGE 2.0-3.0          Comments: Per Dr.Klein's office from 11/25/09 pt to stop coumadin for 2 days then start Pradaxa.  Allergies: No Known Drug Allergies  Anticoagulation Management History:      Positive risk factors for bleeding include an age of 37 years or older and history of CVA/TIA.  The bleeding index is 'intermediate risk'.  Positive CHADS2 values include History of HTN and Prior Stroke/CVA/TIA.  Negative CHADS2 values include Age > 22 years old.  His last INR was 1.1 ratio.  Anticoagulation responsible provider: Tenny Craw MD, Gunnar Fusi.  Exp: 01/2011.    Anticoagulation Management Assessment/Plan:      The target INR is 2.0-3.0.  The next INR is due 11/30/2009.  Anticoagulation instructions were given to patient.  Results were reviewed/authorized by Inactive.         Prior Anticoagulation Instructions: INR 2.4  Continue on same dosage 1 tablet daily except 1.5 tablets on Tuesdays and Fridays.  Recheck in 4 weeks.

## 2010-10-03 NOTE — Letter (Signed)
Summary: [facility]: Immunoassay Fecal Occult Blood (iFOB) Order [facility]  [address] [place], [place] [address]   Phone: [phone]  Fax: [phone]      [facility]: Immunoassay Fecal Occult Blood (iFOB) Order Form   December 02, 2009 MRN: [id number]   Nicholas Bird 1941/05/06   Physicican Name: Jarold Motto   Diagnosis Code: V12.72      Ashok Cordia RN

## 2010-10-03 NOTE — Medication Information (Signed)
Summary: rov/tm  Anticoagulant Therapy  Managed by: Cloyde Reams, RN, BSN Referring MD: Calton Dach, MD PCP: Romero Belling, MD Supervising MD: Riley Kill MD, Maisie Fus Indication 1: Atrial Fibrillation Indication 2: Transcient Ischemic Attack Lab Used: [facility] Site: [address] INR POC 2.4 INR RANGE 2.0-3.0  Dietary changes: no    Health status changes: no    Bleeding/hemorrhagic complications: no    Recent/future hospitalizations: no    Any changes in medication regimen? no    Recent/future dental: no  Any missed doses?: no       Is patient compliant with meds? yes       Allergies (verified): No Known Drug Allergies  Anticoagulation Management History:      The patient is taking warfarin and comes in today for a routine follow up visit.  Positive risk factors for bleeding include an age of 70 years or older and history of CVA/TIA.  The bleeding index is 'intermediate risk'.  Positive CHADS2 values include History of HTN and Prior Stroke/CVA/TIA.  Negative CHADS2 values include Age > 8 years old.  His last INR was 1.1 ratio.  Anticoagulation responsible provider: Riley Kill MD, Maisie Fus.  INR POC: 2.4.  Cuvette Lot#: [id number].  Exp: 01/2011.    Anticoagulation Management Assessment/Plan:      The patient's current anticoagulation dose is Coumadin 5 mg tabs: Take as directed by coumadin clinic..  The target INR is 2.0-3.0.  The next INR is due 11/30/2009.  Anticoagulation instructions were given to patient.  Results were reviewed/authorized by Cloyde Reams, RN, BSN.  He was notified by Cloyde Reams RN.         Prior Anticoagulation Instructions: INR 2.3 Continue 5mg s daily except 7.5mg s on Tuesdays and Fridays. Recheck in 4 weeks.   Current Anticoagulation Instructions: INR 2.4  Continue on same dosage 1 tablet daily except 1.5 tablets on Tuesdays and Fridays.  Recheck in 4 weeks.

## 2010-10-03 NOTE — Assessment & Plan Note (Signed)
Summary: Follow up/dfs    History of Present Illness Visit Type: Follow-up Visit Primary GI MD: Sheryn Bison MD FACP FAGA Primary Provider: Romero Belling, MD Requesting Provider: n/a Chief Complaint: Intermittant dull pain under left rib cage.  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 He denies concurrent hepatobiliary complaints but has occasional left upper quadrant discomfort more of a musculoskeletal pain without any real precipitating or alleviating elements. He has regular daily bowel movements and denies melena or hematochezia. He denies anorexia, weight loss, nausea vomiting, fever, chills, reflux symptoms, or dysphasia. He is status post cholecystectomy and endoscopic sphincterotomy for retained common bile duct stones. He also has had repair of a femoral hernia and has a history of previous partial colonic obstruction related to this hernia with surgery in March of 2010. Before his surgery colonoscopy could not be completed because of the anatomical variations but he did have a colon polyp removed which was an adenoma. Is not at completed colonoscopy since that time. Attempts at barium enema also were unsuccessful. He is followed closely by Dr. Graciela Husbands in cardiology and has recently been switched from Coumadin to Pradaxa 150 mg a day. He has coronary artery disease and recurrent atrial fibrillation, hyperlipidemia, hypertension, and recently had a transient ischemic episode.   GI Review of Systems    Reports abdominal pain.     Location of  Abdominal pain: upper abdomen.    Denies acid reflux, belching, bloating, chest pain, dysphagia with liquids, dysphagia with solids, heartburn, loss of appetite, nausea, vomiting, vomiting blood, weight loss, and  weight gain.        Denies anal fissure, black tarry stools, change in bowel habit, constipation, diarrhea,  diverticulosis, fecal incontinence, heme positive stool, hemorrhoids, irritable bowel syndrome, jaundice, light color stool, liver problems, rectal bleeding, and  rectal pain.    Current Medications (verified): 1)  Metoprolol Succinate 50 Mg  Tb24 (Metoprolol Succinate) .... Take 1 By Mouth Two Times A Day Qd 2)  Allopurinol 300 Mg  Tabs (Allopurinol) .... Take 1 By Mouth Qd 3)  Zetia 10 Mg  Tabs (Ezetimibe) .... Take 1 By Mouth Qd 4)  Align   Caps (Misc Intestinal Flora Regulat) .... Take One Capsule By Mouth Daily 5)  Multivitamins  Tabs (Multiple Vitamin) .... Take 1 Tablet By Mouth Once A Day 6)  Calcium 500 Mg Tabs (Calcium Carbonate) .... Take 1 Tablet By Mouth Once A Day 7)  Pradaxa 150 Mg Caps (Dabigatran Etexilate Mesylate) .... Take 1 Capsule By Mouth Two Times A Day  Allergies (verified): No Known Drug Allergies  Past History:  Past medical, surgical, family and social histories (including risk factors) reviewed for relevance to current acute and chronic problems.  Past Medical History: Reviewed history from 08/16/2009 and no changes required. Coronary artery disease Gout Atrial fibrillation - permanent LE Varicosities Hyperlipidemia Myocardial infarction, hx of Hypertension alcohol dependence chronic elev LFT's TIA  Past Surgical History: Reviewed history from 12/07/2008 and no changes required. Inguinal herniorrhaphy with Dr. Ronnette Juniper in May of 2002. Cardiac Catherization (09/19/1998) Echocardiogram (11/13/1996) Benign tumor removal wrist over 20 years ago Cataract removed 09-19-2006 Cholecystectomy ERCP and common duct stone removal January 2010 Hernia Surgery 11/19/08  Family History: Reviewed history from 10/07/2008 and no changes required. mother with lung cancer No FH of Colon Cancer:  Social History: Reviewed history from 11/04/2008 and no changes required.  Never Smoked Alcohol use-yes occ..heavy daily use and alcoholism retired married Illicit  Drug Use - no Daily Caffeine Use 1 cup coffee in the AM  Review of Systems  The patient denies allergy/sinus, anemia, anxiety-new, arthritis/joint pain, back pain, blood in urine, breast changes/lumps, change in vision, confusion, cough, coughing up blood, depression-new, fainting, fatigue, fever, headaches-new, hearing problems, heart murmur, heart rhythm changes, itching, menstrual pain, muscle pains/cramps, night sweats, nosebleeds, pregnancy symptoms, shortness of breath, skin rash, sleeping problems, sore throat, swelling of feet/legs, swollen lymph glands, thirst - excessive , urination - excessive , urination changes/pain, urine leakage, vision changes, and voice change.    Vital Signs:  Patient profile:   70 year old male Height:      71 inches Weight:      199.38 pounds BMI:     27.91 Pulse rate:   60 / minute Pulse rhythm:   regular BP sitting:   148 / 78  (left arm) Cuff size:   regular  Vitals Entered By: Christie Nottingham CMA Duncan Dull) (December 02, 2009 10:24 AM)  Physical Exam  General:  Well developed, well nourished, no acute distress.healthy appearing.   Head:  Normocephalic and atraumatic. Eyes:  PERRLA, no icterus.exam deferred to patient's ophthalmologist.   Lungs:  Clear throughout to auscultation. Heart:  Regular rate and rhythm; no murmurs, rubs,  or bruits.irregular rhythm:.   Abdomen:  Mild hepatomegaly in the right upper quadrant with some nodularity but no tenderness. I cannot appreciate splenomegaly, other abdominal masses, tenderness, or ascites. Bowel sounds are normal. Rectal exam is deferred. Msk:  Symmetrical with no gross deformities. Normal posture. Extremities:  No clubbing, cyanosis, edema or deformities noted. Neurologic:  Alert and  oriented x4;  grossly normal neurologically. Inguinal Nodes:  No significant inguinal adenopathy. Psych:  Alert and cooperative. Normal mood and affect.   Impression & Recommendations:  Problem # 1:  ABDOMINAL PAIN,  LEFT LOWER QUADRANT (ICD-789.04) Assessment Unchanged He now relates intermittent left upper quadrant pain without other GI symptomatology. There is no reason to suspect chronic pancreatitis, recurrent biliary problems, splenic infarction, ischemic bowel, et Karie Soda. He does need to complete his colonoscopy per his previous abnormal colonoscopy with colonic polyposis. He would like to postpone this to the fall of this year. Therefore, I will have him do immunologic stool cards for occult blood. He does take daily probiotics which we will continue. She also uses PPI therapy on a p.r.n. basis for mild postprandial GERD.The patient is to call if he has worsening abdominal pain, melena, hematochezia, or other symptomatology. He does keep a close contact with his physicians and Dr. Everardo All is his  primary care physician.  Problem # 2:  TIA (ICD-435.9) Assessment: Improved Continued anticoagulation prevention therapy as per Dr. Graciela Husbands.  Problem # 3:  ALCOHOLIC CIRRHOSIS OF LIVER (ICD-571.2) Assessment: Improved Again, abstinence from alcohol has been addressed and advised. Review of recent lab shows normal liver function test and elevated serum albumin of 4.4 g percent which is a very positive indicator. Probably at the time of his colonoscopy I will repeat his endoscopy to screen for esophageal varices. I see no need for repeat blood work at this time.  Problem # 4:  BENIGN PROSTATIC HYPERTROPHY, HX OF (ICD-V13.8) Assessment: Comment Only  Patient Instructions: 1)  Please go to the basement to get instructions re stool collection. 2)  The medication list was reviewed and reconciled.  All changed / newly prescribed medications were explained.  A complete medication list  was provided to the patient / caregiver. 3)  We will plan to schedule him for colonoscopy and endoscopy in the fall of 2011. We will last Dr. Graciela Husbands to advise as per holding his anticoagulation medications before this procedure. 4)  Copy  sent to Dr.Shaun Everardo All and Dr. Hurman Horn:  5)  Please continue current medications.

## 2010-10-03 NOTE — Medication Information (Signed)
Summary: rov/ewj  Anticoagulant Therapy  Managed by: Bethena Midget, RN, BSN Referring MD: Calton Dach, MD PCP: Romero Belling, MD Supervising MD: Juanda Chance MD, Ivorie Uplinger Indication 1: Atrial Fibrillation Indication 2: Transcient Ischemic Attack Lab Used: [facility] Site: [address] INR POC 2.2 INR RANGE 2.0-3.0  Dietary changes: no    Health status changes: no    Bleeding/hemorrhagic complications: yes       Details: Subconjuntival hemmorrage into Rt. eye, denies visual changes.   Recent/future hospitalizations: no    Any changes in medication regimen? no    Recent/future dental: no  Any missed doses?: no       Is patient compliant with meds? yes       Allergies: No Known Drug Allergies  Anticoagulation Management History:      The patient is taking warfarin and comes in today for a routine follow up visit.  Positive risk factors for bleeding include an age of 69 years or older and history of CVA/TIA.  The bleeding index is 'intermediate risk'.  Positive CHADS2 values include History of HTN and Prior Stroke/CVA/TIA.  Negative CHADS2 values include Age > 56 years old.  His last INR was 1.1 ratio.  Anticoagulation responsible provider: Juanda Chance MD, Smitty Cords.  INR POC: 2.2.  Cuvette Lot#: [id number].  Exp: 10/2010.    Anticoagulation Management Assessment/Plan:      The patient's current anticoagulation dose is Coumadin 5 mg tabs: Take as directed by coumadin clinic..  The target INR is 2.0-3.0.  The next INR is due 10/05/2009.  Anticoagulation instructions were given to patient.  Results were reviewed/authorized by Bethena Midget, RN, BSN.  He was notified by Bethena Midget, RN, BSN.         Prior Anticoagulation Instructions: INR 1.7  Start taking 1 tablet daily except 1.5 tablets on Tuesdays and Fridays.   Recheck in 3 weeks.    Current Anticoagulation Instructions: INR 2.2 Continue 1 tablet daily except 1.5 tablets on Tuesdays and Fridays. Recheck in 4 weeks.

## 2010-10-03 NOTE — Assessment & Plan Note (Signed)
Summary: APPT TIME @ 9:30/PER COUMADIN/SAF  Medications Added PRADAXA 150 MG CAPS (DABIGATRAN ETEXILATE MESYLATE) Take 1 capsule by mouth two times a day      Allergies Added: NKDA  Primary Provider:  Romero Belling, MD   History of Present Illness: Nicholas Bird is seen in followup for atrial fibrillation which is permanent. He had been doing very well until August 13 at which time he developed transient ectasia. It was felt that this was a TIA. There is apparently some lack of consensus as to whether this was a thromboembolic episode or not.  We ultimately decided to put him on oral anticoagulation therapy. The Coumadin is quite a nuisance. It is associated with a fair amount of superficial bleeding. there have been no recurrent events.  He also describes a left subcostal discomfort which has been about 3 weeks.It has been at least temporally assoicated with unusual bowel movement  Current Medications (verified): 1)  Metoprolol Succinate 50 Mg  Tb24 (Metoprolol Succinate) .... Take 1 By Mouth Two Times A Day Qd 2)  Allopurinol 300 Mg  Tabs (Allopurinol) .... Take 1 By Mouth Qd 3)  Zetia 10 Mg  Tabs (Ezetimibe) .... Take 1 By Mouth Qd 4)  Align   Caps (Misc Intestinal Flora Regulat) .... Take One Capsule By Mouth Daily 5)  Multivitamins  Tabs (Multiple Vitamin) .... Take 1 Tablet By Mouth Once A Day 6)  Calcium 500 Mg Tabs (Calcium Carbonate) .... Take 1 Tablet By Mouth Once A Day 7)  Coumadin 5 Mg Tabs (Warfarin Sodium) .... Take As Directed By Coumadin Clinic.  Allergies (verified): No Known Drug Allergies  Vital Signs:  Patient profile:   70 year old male Height:      71 inches Weight:      199 pounds BMI:     27.86 Pulse rate:   62 / minute BP sitting:   133 / 80  (left arm) Cuff size:   large  Vitals Entered By: Oswald Hillock (November 25, 2009 9:37 AM)  Physical Exam  General:  The patient was alert and oriented in no acute distress.Neck veins were flat, carotids were  brisk. Lungs were clear. Heart sounds were irregular without murmurs or gallops. Abdomen was soft with active bowel sounds.no tenderness is noted along the left costal margin or in the left upper quadrant There is no clubbing cyanosis or edema.    Impression & Recommendations:  Problem # 1:  TIA (ICD-435.9) Wwill plan to change his Coumadin to Pradaxa.  We had a lengthy discussion regarding the relative merits of Coumadin versus Pradaxa. These included a relative benefits as well as risks. The patient would like to begin on Pradaxa. This discussion took greater than 10 minutes  Problem # 2:  ATRIAL FIBRILLATION (ICD-427.31) He remained well rate controlled. As above we will change his oral anticoagulation The following medications were removed from the medication list:    Coumadin 5 Mg Tabs (Warfarin sodium) .Marland Kitchen... Take as directed by coumadin clinic. His updated medication list for this problem includes:    Metoprolol Succinate 50 Mg Tb24 (Metoprolol succinate) .Marland Kitchen... Take 1 by mouth two times a day qd  Problem # 3:  ABDOMINAL PAIN, LEFT LOWER QUADRANT (ICD-789.04) The patient had episode of left upper quadrant discomfort. Associated with unusual BMs. Will have him followup with Dr. Jarold Motto  Patient Instructions: 1)  Your physician has recommended you make the following change in your medication: Stop Coumadin, after 2 days, start Pradaxa 150mg  1 capsule  twice daily.  2)  Your physician wants you to follow-up in: 6 months with Dr Graciela Husbands.   You will receive a reminder letter in the mail two months in advance. If you don't receive a letter, please call our office to schedule the follow-up appointment. Prescriptions: PRADAXA 150 MG CAPS (DABIGATRAN ETEXILATE MESYLATE) Take 1 capsule by mouth two times a day  #60 x 11   Entered by:   Optometrist BSN   Authorized by:   Nathen May, MD, [facility]   Signed by:   Gypsy Balsam RN BSN on 11/25/2009   Method used:   Tax adviser to         [facility]. [phone]* (retail)       [address]       [place], [place]  [address]       Ph: [phone]       Fax: [phone]   RxID:   [id number]   Appended Document: APPT TIME @ 9:30/PER COUMADIN/SAF pt called.Marland Kitchenasked to call if problems.[name]

## 2010-10-05 NOTE — Progress Notes (Signed)
Summary: pt calling re pre auth   Phone Note Call from Patient   Caller: Patient [phone] Reason for Call: Talk to Nurse Summary of Call: pt calling  re xarelto needs pre auth [phone] Initial call taken by: Glynda Jaeger,  September 26, 2010 4:34 PM  Follow-up for Phone Call        tried calling 800 number provided and is a non-working [employer] number per recording. Claris Gladden, RN, BSN 09/27/10 [phone] pt said that the 800 number was provided by [facility] on [address] and he is down to 2 pills. adv will call drug store and get resolved. Claris Gladden, RN, BSN 09/27/10 0845  Pt calling back with the right number Judie Grieve  September 27, 2010 9:03 AM     Appended Document: pt calling re pre Berkley Harvey [facility] and he adv try [phone] for pre auth. Claris Gladden, RN, BSN   1/25/2 1233  Appended Document: pt calling re pre auth spoke w/[employer] at [phone] and they will determine if pre auth approved with in next 72 hours. adv Mrs. Chamberlin of info and that 5 days of samples are at front desk. Claris Gladden, RN, BSN

## 2010-10-05 NOTE — Progress Notes (Addendum)
Summary: question about medication  Medications Added XARELTO 10 MG TABS (RIVAROXABAN) once daily       Phone Note Call from Patient Call back at Home Phone [phone]   Caller: Patient Summary of Call: Pt have question about medication Xarelto 10mg  Initial call taken by: Judie Grieve,  September 28, 2010 10:07 AM  Follow-up for Phone Call        adv Nicholas Bird that medication is approved. approval number is [id number] and is good 09/28/10 to 09/03/11.  Follow-up by: Claris Gladden RN,  September 28, 2010 3:13 PM    New/Updated Medications: XARELTO 10 MG TABS (RIVAROXABAN) once daily Prescriptions: XARELTO 10 MG TABS (RIVAROXABAN) once daily  #30 x 11   Entered by:   Claris Gladden RN   Authorized by:   Nathen May, MD, [facility]   Signed by:   Claris Gladden RN on 09/28/2010   Method used:   Electronically to        [facility]. [phone]* (retail)       [address]       [place], [place]  [address]       Ph: [phone]       Fax: [phone]   RxID:   [id number]   Appended Document: question about medication Nicholas Bird he needs to be on Xeralto 20 not 10 mg thnaks Nicholas Bird  Appended Document: question about medication pt aware will take 2 tabs until runs out will call new script in  for 20 mg of xarelto./cy

## 2010-10-05 NOTE — Progress Notes (Addendum)
Summary: calling about B/P and pt needs blood thinner  Medications Added XARELTO 10 MG TABS (RIVAROXABAN) take 2 tablets daily       Phone Note Call from Patient Call back at Home Phone [phone]   Caller: Patient Summary of Call: Pt B/P running 150/85 and pt needs blood thinner Initial call taken by: Judie Grieve,  September 18, 2010 9:09 AM  Follow-up for Phone Call        pt states that bp runs mostly 150's, some 160's/85-90. Since stopping Pradaxa the gas and bloating has reduced but stomach still tender. Will call in Xarelto and place trial card at front desk for pt. Pt will discuss BP with his MD but wanted Dr. Graciela Husbands aware in the event he wanted to recommend a BP med.  Follow-up by: Claris Gladden RN,  September 18, 2010 10:21 AM    New/Updated Medications: XARELTO 10 MG TABS (RIVAROXABAN) take 2 tablets daily Prescriptions: XARELTO 10 MG TABS (RIVAROXABAN) take 2 tablets daily  #60 x 11   Entered by:   Claris Gladden RN   Authorized by:   Nathen May, MD, [facility]   Signed by:   Claris Gladden RN on 09/18/2010   Method used:   Electronically to        [facility]. [phone]* (retail)       [address]       [place], [place]  [address]       Ph: [phone]       Fax: [phone]   RxID:   [id number]   Appended Document: calling about B/P and pt needs blood thinner begin him on lisinoprtil 10 mg daily and bmet in two weeks  remind of risks of renal issuew, cough and angiodedema  Appended Document: calling about B/P and pt needs blood thinner adv pt of new med to take. pt expressed understanding.  He will see Dr. Everardo All next week and will request a BMET. Claris Gladden, RN, BSN    Clinical Lists Changes  Medications: Added new medication of LISINOPRIL 10 MG TABS (LISINOPRIL) once daily - Signed Rx of LISINOPRIL 10 MG TABS (LISINOPRIL) once daily;  #30 x 11;  Signed;  Entered by: Claris Gladden RN;   Authorized by: Nathen May, MD, [facility];  Method used: Electronically to [facility]. #[id number]*, [address] [place], [place], [place]  [address], Ph: [phone], Fax: [phone]    Prescriptions: LISINOPRIL 10 MG TABS (LISINOPRIL) once daily  #30 x 11   Entered by:   Claris Gladden RN   Authorized by:   Nathen May, MD, [facility]   Signed by:   Claris Gladden RN on 09/19/2010   Method used:   Electronically to        [facility]. [phone]* (retail)       [address]       [place], [place]  [address]       Ph: [phone]       Fax: [phone]   RxID:   [id number]

## 2010-10-05 NOTE — Progress Notes (Signed)
Summary: question on meds  Medications Added METOPROLOL TARTRATE 50 MG TABS (METOPROLOL TARTRATE) two times a day       Phone Note Call from Patient Call back at Home Phone [phone]   Caller: Patient Reason for Call: Talk to Nurse Summary of Call: pt has question on meds. re generic meds Initial call taken by: Roe Coombs,  August 16, 2010 10:45 AM  Follow-up for Phone Call        lmfcb. Claris Gladden, RN, BSN 11/14 1054 spoke w/pt and adv we had his written letter and will send generic med order today. pt expressed understanding.  Follow-up by: Claris Gladden RN,  August 16, 2010 2:27 PM    New/Updated Medications: METOPROLOL TARTRATE 50 MG TABS (METOPROLOL TARTRATE) two times a day Prescriptions: METOPROLOL TARTRATE 50 MG TABS (METOPROLOL TARTRATE) two times a day  #60 x 11   Entered by:   Claris Gladden RN   Authorized by:   Nathen May, MD, [facility]   Signed by:   Claris Gladden RN on 08/16/2010   Method used:   Electronically to        [facility]. [phone]* (retail)       [address]       [place], [place]  [address]       Ph: [phone]       Fax: [phone]   RxID:   [id number]

## 2010-10-05 NOTE — Assessment & Plan Note (Signed)
Summary: rov      Allergies Added: NKDA  Visit Type:  Follow-up Primary Provider:  Romero Belling, MD  CC:  bloated in stomach area.  History of Present Illness: Nicholas Bird is seen in followup for atrial fibrillation which is permanent. He had been doing very well until April 15 2009 at which time he developed transient ectasia. It was felt that this was a TIA. There is apparently some lack of consensus as to whether this was a thromboembolic episode or not.  We ultimately decided to put him on oral anticoagulation therapy. The Coumadin is quite a nuisance.He thus ended up on Pradaxa.  Over the last month he has had symptoms of abdominal discomfort and bloating. He stopped his intestinal flora repletion program with some improvement but not resliution   Current Medications (verified): 1)  Metoprolol Tartrate 50 Mg Tabs (Metoprolol Tartrate) .... Two Times A Day 2)  Allopurinol 300 Mg  Tabs (Allopurinol) .... Take 1 By Mouth Qd 3)  Zetia 10 Mg  Tabs (Ezetimibe) .... Take 1 By Mouth Qd 4)  Align   Caps (Misc Intestinal Flora Regulat) .... Take One Capsule By Mouth Daily 5)  Multivitamins  Tabs (Multiple Vitamin) .... Take 1 Tablet By Mouth Once A Day 6)  Calcium 500 Mg Tabs (Calcium Carbonate) .... Take 1 Tablet By Mouth Once A Day 7)  Pradaxa 150 Mg Caps (Dabigatran Etexilate Mesylate) .... Take 1 Capsule By Mouth Two Times A Day  Allergies (verified): No Known Drug Allergies  Past History:  Past Medical History: Last updated: 08/16/2009 Coronary artery disease Gout Atrial fibrillation - permanent LE Varicosities Hyperlipidemia Myocardial infarction, hx of Hypertension alcohol dependence chronic elev LFT's TIA  Vital Signs:  Patient profile:   70 year old male Height:      71 inches Weight:      199 pounds BMI:     27.86 Pulse rate:   75 / minute BP sitting:   157 / 93  (left arm) Cuff size:   regular  Vitals Entered By: Burnett Kanaris, CNA (September 07, 2010 12:44  PM)  Physical Exam  General:  The patient was alert and oriented in no acute distress.Neck veins were flat, carotids were brisk. Lungs were clear. Heart sounds were irregular without murmurs or gallops. Abdomen was soft with active bowel sounds. There is no clubbing cyanosis or edema.    EKG  Procedure date:  09/07/2010  Findings:      atrial fibrillation at 66 Intervals-/0.09/0.43 Otherwise normal  Impression & Recommendations:  Problem # 1:  ABDOMINAL PAIN, GENERALIZED (ICD-789.07) The patient has had abdominal discomfort in the setting of Pradaxa therapy. He is seen Dr. Jarold Motto for similar symptoms. These notes were reviewed. We will plan to undertake exclusion trial for his Pradaxa to see if he is implicated as the cause. We'll stop it for 2 weeks.  Problem # 2:  ATRIAL FIBRILLATION (ICD-427.31) The patient has permanent atrial fibrillation. He has suffered an intercurrent TIA and hence is on oral anticoagulation as noted above. Because of the exclusion trial, we will plan to replace his Pradaxa with xiralto 2 we will hold the Pradaxa for 4 doses and begin the [place] thereafter. We will do this for 2 weeks. His updated medication list for this problem includes:    Metoprolol Tartrate 50 Mg Tabs (Metoprolol tartrate) .Marland Kitchen..Marland Kitchen Two times a day  Orders: EKG w/ Interpretation (93000)  Problem # 3:  HYPERTENSION (ICD-401.9) the patient has significant hypertension today was  repeated at 160 on a couple of occasions by me. This is quite anomalous for the patient. Hence we will have him get an outpatient measurements over the next week or so and when he calls [place] with his Pradaxa exclusion car result will report his blood pressure. He is to see Dr. Everardo All later this month and we would plan to a initiate antihypertensive therapy effect which could then be reviewed by Dr. Everardo All His updated medication list for this problem includes:    Metoprolol Tartrate 50 Mg Tabs (Metoprolol tartrate)  .Marland Kitchen..Marland Kitchen Two times a day  Patient Instructions: 1)  Your physician has recommended you make the following change in your medication: start Xarelto. Call in 2 weeks to let us know how your blood pressure is and how your stomach is feeling now that you are off Pradaxa.  2)  Your physician wants you to follow-up in:6 months   You will receive a reminder letter in the mail two months in advance. If you don't receive a letter, please call our office to schedule the follow-up appointment.

## 2010-10-11 NOTE — Assessment & Plan Note (Signed)
Summary: yearly medicare physical-lb   Vital Signs:  Patient profile:   70 year old male Height:      71 inches (180.34 cm) Weight:      200.13 pounds (90.97 kg) BMI:     28.01 O2 Sat:      97 % on Room air Temp:     98.9 degrees F (37.17 degrees C) oral Pulse rate:   67 / minute BP sitting:   122 / 72  (left arm) Cuff size:   regular  Vitals Entered By: Brenton Grills CMA Duncan Dull) (September 27, 2010 9:52 AM)  O2 Flow:  Room air CC: Yearly Medicare Wellness/aj Is Patient Diabetic? No Comments pt declined flu shot   Primary Provider:  Romero Belling, MD  CC:  Yearly Medicare Wellness/aj.  History of Present Illness: here for regular wellness examination.  He's feeling pretty well in general, and does not drink or smoke.   Current Medications (verified): 1)  Metoprolol Tartrate 50 Mg Tabs (Metoprolol Tartrate) .... Two Times A Day 2)  Allopurinol 300 Mg  Tabs (Allopurinol) .... Take 1 By Mouth Qd 3)  Zetia 10 Mg  Tabs (Ezetimibe) .... Take 1 By Mouth Qd 4)  Align   Caps (Misc Intestinal Flora Regulat) .... Take One Capsule By Mouth Daily 5)  Multivitamins  Tabs (Multiple Vitamin) .... Take 1 Tablet By Mouth Once A Day 6)  Calcium 500 Mg Tabs (Calcium Carbonate) .... Take 1 Tablet By Mouth Once A Day 7)  Xarelto 10 Mg Tabs (Rivaroxaban) .... Take 2 Tablets Daily 8)  Lisinopril 10 Mg Tabs (Lisinopril) .... Once Daily  Allergies (verified): No Known Drug Allergies  Past History:  Past Medical History: Coronary artery disease Gout Atrial fibrillation - permanent LE Varicosities Hyperlipidemia Myocardial infarction, hx of Hypertension alcohol dependence chronic elev LFT's TIA  cardiol: [facility] gi: [place]  Family History: Reviewed history from 10/07/2008 and no changes required. mother with lung cancer No FH of Colon Cancer:  Social History: Reviewed history from 11/04/2008 and no changes required. Never Smoked Alcohol use-yes occ..heavy daily use and  alcoholism retired married Illicit Drug Use - no Daily Caffeine Use 1 cup coffee in the AM  Review of Systems       The patient complains of weight loss and weight gain.  The patient denies fever, vision loss, decreased hearing, chest pain, syncope, dyspnea on exertion, prolonged cough, headaches, abdominal pain, melena, hematochezia, severe indigestion/heartburn, hematuria, suspicious skin lesions, and depression.    Physical Exam  General:  normal appearance.   Head:  head: no deformity eyes: no periorbital swelling, no proptosis external nose and ears are normal mouth: no lesion seen Neck:  Supple without thyroid enlargement or tenderness.  Lungs:  Clear to auscultation bilaterally. Normal respiratory effort.  Heart:  Regular rate and rhythm without murmurs or gallops noted. Normal S1,S2.   Abdomen:  abdomen is soft, nontender.  there is a ? of liver edge palpable. not distended.  no hernia  Rectal:  normal external and internal exam.  heme neg  Prostate:  Normal size prostate without masses or tenderness.  Msk:  muscle bulk and strength are grossly normal.  no obvious joint swelling.  gait is normal and steady  Pulses:  dorsalis pedis intact bilat.  no carotid bruit  Extremities:  no deformity.  no ulcer on the feet.  feet are of normal color.  there are bilateral varicosities. feet are slightly cool to the touch trace right pedal edema and trace left  pedal edema.   Neurologic:  cn 2-12 grossly intact.   readily moves all 4's.   sensation is intact to touch on the feet  Skin:  normal texture and temp.  no rash.  not diaphoretic  Cervical Nodes:  No significant adenopathy.  Psych:  Alert and cooperative; normal mood and affect; normal attention span and concentration.     Impression & Recommendations:  Problem # 1:  ROUTINE GENERAL MEDICAL EXAM@HEALTH  CARE FACL (ICD-V70.0)  Other Orders: TLB-Lipid Panel (80061-LIPID) TLB-BMP (Basic Metabolic Panel-BMET)  (80048-METABOL) TLB-CBC Platelet - w/Differential (85025-CBCD) TLB-Hepatic/Liver Function Pnl (80076-HEPATIC) TLB-TSH (Thyroid Stimulating Hormone) (84443-TSH) TLB-Uric Acid, Blood (84550-URIC) TLB-PSA (Prostate Specific Antigen) (84153-PSA) Est. Patient 65& > (16109)  Patient Instructions: 1)  please consider these measures for your health:  minimize alcohol (this is especially important in those with atrial fibrillation).  do not use tobacco products.  have a colonoscopy at least every 10 years from age 85.  keep firearms safely stored.  always use seat belts.  have working smoke alarms in your home.  see an eye doctor and dentist regularly.  never drive under the influence of alcohol or drugs (including prescription drugs).  those with fair skin should take precautions against the sun. 2)  please let me know what your wishes would be, if artificial life support measures should become necessary.  it is critically important to prevent falling down (keep floor areas well-lit, dry, and free of loose objects). 3)  Please schedule a follow-up appointment in 1 year. 4)  blood tests are being ordered for you today.  please call [phone] to hear your test results. 5)  pending the test results, please continue the same medications for now. 6)  (update: we discussed code status.  pt requests full code, but would not want to be started or maintained on artificial life-support measures if there was not a reasonable chance of recovery).   Orders Added: 1)  TLB-Lipid Panel [80061-LIPID] 2)  TLB-BMP (Basic Metabolic Panel-BMET) [80048-METABOL] 3)  TLB-CBC Platelet - w/Differential [85025-CBCD] 4)  TLB-Hepatic/Liver Function Pnl [80076-HEPATIC] 5)  TLB-TSH (Thyroid Stimulating Hormone) [84443-TSH] 6)  TLB-Uric Acid, Blood [84550-URIC] 7)  TLB-PSA (Prostate Specific Antigen) [84153-PSA] 8)  Est. Patient 65& > [81191]   Immunization History:  Tetanus/Td Immunization History:    Tetanus/Td:  historical  (09/03/2004)   Immunization History:  Tetanus/Td Immunization History:    Tetanus/Td:  Historical (09/03/2004)

## 2010-10-23 ENCOUNTER — Telehealth: Payer: Self-pay | Admitting: Internal Medicine

## 2010-10-31 NOTE — Progress Notes (Signed)
Summary: pt calling re change in dosage due to nose bleeds   Phone Note Call from Patient   Caller: Patient [phone] Reason for Call: Talk to Nurse Summary of Call: pt on blood thinner, xralto two times a day, had nose bleeds, looked online it stated to take it one a day, wants to confirm this with [place] before changing the dosage Initial call taken by: Glynda Jaeger,  October 23, 2010 10:05 AM  Follow-up for Phone Call        Phone Call Completed PT AWARE SHOULD BE ON XRALTO 20 MG 1 once daily  MAY HAVE TAKEN 2 20 MG FOR A COUPLE OF DAYS HAS SINCE CORRECTED  DOSAGE. Follow-up by: Scherrie Bateman, LPN,  October 23, 2010 1:14 PM

## 2010-11-07 ENCOUNTER — Telehealth: Payer: Self-pay | Admitting: Gastroenterology

## 2010-11-09 ENCOUNTER — Encounter: Payer: Self-pay | Admitting: Gastroenterology

## 2010-11-09 ENCOUNTER — Other Ambulatory Visit: Payer: Medicare Other

## 2010-11-09 ENCOUNTER — Ambulatory Visit (INDEPENDENT_AMBULATORY_CARE_PROVIDER_SITE_OTHER): Payer: Medicare Other | Admitting: Gastroenterology

## 2010-11-09 ENCOUNTER — Other Ambulatory Visit: Payer: Self-pay | Admitting: Gastroenterology

## 2010-11-09 DIAGNOSIS — R7989 Other specified abnormal findings of blood chemistry: Secondary | ICD-10-CM

## 2010-11-09 DIAGNOSIS — R1084 Generalized abdominal pain: Secondary | ICD-10-CM

## 2010-11-09 DIAGNOSIS — K7010 Alcoholic hepatitis without ascites: Secondary | ICD-10-CM

## 2010-11-09 LAB — HEPATIC FUNCTION PANEL
ALT: 40 U/L (ref 0–53)
AST: 39 U/L — ABNORMAL HIGH (ref 0–37)
Alkaline Phosphatase: 53 U/L (ref 39–117)
Bilirubin, Direct: 0.3 mg/dL (ref 0.0–0.3)
Total Bilirubin: 1.4 mg/dL — ABNORMAL HIGH (ref 0.3–1.2)
Total Protein: 7.7 g/dL (ref 6.0–8.3)

## 2010-11-13 ENCOUNTER — Encounter: Payer: Self-pay | Admitting: Gastroenterology

## 2010-11-14 ENCOUNTER — Encounter: Payer: Self-pay | Admitting: Gastroenterology

## 2010-11-14 NOTE — Assessment & Plan Note (Addendum)
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 Apparently had severe gastrointestinal reaction to Pradaxa, and this was discontinued by Dr. Graciela Husbands. However, he continued to gas, bloating, explosive diarrhea, abdominal swelling and discomfort, but no specific hepatobiliary symptomatology, indigestion, heartburn, melena or hematochezia. He is a heavy user of alcohol and has biopsy-proven early cirrhosis. He also has a long history of recurrent colon polyps and is due for followup colonoscopy. He denies any specific hepatobiliary complaints, nausea vomiting, anorexia, weight loss, with frequent antibiotic use. He was on probiotic therapy which he discontinued.  He continues use ethanol daily despite medical advice. Over the last several years he had repair of a rather prominent left anal hernia and laparoscopic cholecystectomy and ERCP with common bile duct stone extraction in March of 20 TM. He is followed closely by cardiology. He also is followed by Dr. Jill Side in primary care. Review of labs from this year showed normal CBC and metabolic profile except for mildly elevated liver function test consistent with alcoholic hepatitis. The patient denies any mental status changes, swelling of his legs or abdomen.   GI Review of Systems    Reports abdominal pain and  bloating.     Location of  Abdominal pain: epigastric area.    Denies acid reflux, belching, chest pain, dysphagia with liquids, dysphagia with solids, heartburn, loss of appetite, nausea, vomiting, vomiting blood, weight loss, and  weight gain.       Reports diarrhea.     Denies anal fissure, black tarry stools, change in bowel habit, constipation, diverticulosis, fecal incontinence, heme positive stool, hemorrhoids, irritable bowel syndrome, jaundice, light color stool, liver problems, rectal bleeding, and  rectal pain.    Current Medications (verified): 1)  Metoprolol Tartrate 50 Mg Tabs (Metoprolol Tartrate) .... Two Times A Day 2)  Allopurinol 300 Mg  Tabs (Allopurinol) .... Take 1 By Mouth Qd 3)  Zetia 10 Mg  Tabs (Ezetimibe) .... Take 1 By Mouth Qd 4)  Multivitamins  Tabs (Multiple Vitamin) .... Take 1 Tablet By Mouth Once A Day 5)  Calcium 500 Mg Tabs (Calcium Carbonate) .... Take 1 Tablet By Mouth Once A Day 6)  Xarelto 20 Mg Tabs (Rivaroxaban) .Marland Kitchen.. 1 Once Daily 7)  Lisinopril 10 Mg Tabs (Lisinopril) .... Once Daily  Allergies (verified): No Known Drug Allergies  Past History:  Past medical, surgical, family and social histories (including risk factors) reviewed for relevance to current acute and chronic problems.  Past Medical History: Reviewed history from 09/27/2010 and no changes required. Coronary artery disease Gout Atrial fibrillation - permanent LE Varicosities Hyperlipidemia Myocardial infarction, hx of Hypertension alcohol dependence chronic elev LFT's TIA  cardiol: [facility] gi: [facility]  Past Surgical History: Reviewed history from 12/07/2008 and no changes required. Inguinal herniorrhaphy with Dr. Ronnette Juniper in May of 2002. Cardiac Catherization (09/19/1998) Echocardiogram (11/13/1996) Benign tumor removal wrist over 20 years ago Cataract removed 09-19-2006 Cholecystectomy ERCP and common duct stone removal January 2010 Hernia Surgery 11/19/08  Family History: Reviewed history from 10/07/2008 and no changes required. mother with lung cancer No FH of Colon Cancer:  Social History: Reviewed history from 11/04/2008 and no changes required. Never Smoked  Alcohol use-yes occ..heavy daily use and  alcoholism retired married Illicit Drug Use - no Daily Caffeine Use 1 cup coffee in the AM  Review of Systems  The patient denies allergy/sinus, anemia, anxiety-new, arthritis/joint pain, back pain, blood in urine, breast changes/lumps, change in vision, confusion, cough, coughing up blood, depression-new, fainting, fatigue, fever, headaches-new, hearing problems, heart murmur, heart rhythm changes, itching, menstrual pain, muscle pains/cramps, night sweats, nosebleeds, pregnancy symptoms, shortness of breath, skin rash, sleeping problems, sore throat, swelling of feet/legs, swollen lymph glands, thirst - excessive , urination - excessive , urination changes/pain, urine leakage, vision changes, and voice change.         complaints of marked decrease in his sex drive over the last 6 months.  Vital Signs:  Patient profile:   71 year old male Height:      71 inches Weight:      197.13 pounds BMI:     27.59 Pulse rate:   60 / minute Pulse rhythm:   regular BP sitting:   110 / 80  (left arm) Cuff size:   regular  Vitals Entered By: June McMurray CMA Duncan Dull) (November 09, 2010 12:03 PM)  Physical Exam  General:  Well developed, well nourished, no acute distress.Fascial spider telangiectasias noted but no evidence of icterus. Head:  Normocephalic and atraumatic. Eyes:  PERRLA, no icterus.exam deferred to patient's ophthalmologist.   Lungs:  Clear throughout to auscultation. Heart:  Regular rate and rhythm; no murmurs, rubs,  or bruits. Abdomen:  Soft, nontender and nondistended. No masses, hepatosplenomegaly or hernias noted. Normal bowel sounds.Enlarged Liver with firm somewhat nodular  edge in  the right upper quadrant noted. I cannot appreciate splenomegaly, other abdominal masses, tenderness, or ascites. Msk:  Symmetrical with no gross deformities. Normal posture. Extremities:  No clubbing, cyanosis, edema or deformities noted. Neurologic:  Alert and  oriented x4;  grossly normal  neurologically. Psych:  Alert and cooperative. Normal mood and affect.no That asterixis or mental status changes noted.   Impression & Recommendations:  Problem # 1:  ABDOMINAL PAIN, GENERALIZED (ICD-789.07) Assessment Unchanged Rule Out worsening liver disease with ascites, retained common bile duct stone, C. difficile infection, chronic malabsorption from occult chronic pancreatitis, versus alcohol effects on his GI motility. He is status post cholecystectomy and may benefit from Colestid administration. Actually, I decided to start him on Colestid 1 g in mid a.m. He is due for colonoscopy exam and we will check with Dr. Graciela Husbands in cardiology as per status of his anticoagulants before this procedure. He also will need followup endoscopy to screen for esophageal varices. Before these have been scheduled I will see him back in 2 weeks' time. Stool for C. difficile and routine culture and sensitivity ordered as well as upper abdominal ultrasound. We will start Colestid and also daily Florstar. Basic screening hepatic function test also reordered. This will include CDT level to assess his current alcohol intake which I suspect is rather high. There is no evidence on exam of hepatic decompensation. Also there is nothing in his exam or history to suggest spontaneous bacterial peritonitis. Orders: T-C diff by PCR (16109) T-Culture, Stool ([id number]) TLB-Ammonia (82140-AMON) TLB-PT (Protime) (85610-PTP) TLB-Hepatic/Liver Function Pnl (80076-HEPATIC) T-CDT (carbohydrate deficient transferrin) (60454-09811) Ultrasound Abdomen (UAS)  Problem # 2:  PERSONAL HX COLONIC POLYPS (ICD-V12.72) Assessment: Unchanged followup colonoscopy will be needed.  Problem # 3:  GERD (ICD-530.81) Assessment: Improved none problem at this time and he is not on PPI therapy.  Problem # 4:  ALCOHOLIC  CIRRHOSIS OF LIVER (ICD-571.2) Assessment: Unchanged per above  Patient Instructions: 1)  Copy sent to : Romero Belling, MD 2)  Please go to the basement today for your labs.  3)  Your scheduled for an abdominal ultrasound on 11/15/2010, please follow the seperate instruction.  4)  Your prescription(s) have been sent to you pharmacy.  5)  Please schedule a follow-up appointment in 2 weeks.  6)  The medication list was reviewed and reconciled.  All changed / newly prescribed medications were explained.  A complete medication list was provided to the patient / caregiver. Prescriptions: FLORASTOR 250 MG CAPS (SACCHAROMYCES BOULARDII) take one by mouth once daily  #30 x 3   Entered by:   Harlow Mares CMA (AAMA)   Authorized by:   Mardella Layman MD [facility]   Signed by:   Harlow Mares CMA (AAMA) on 11/09/2010   Method used:   Electronically to        [facility]. [phone]* (retail)       [address]       [place], [place]  [address]       Ph: [phone]       Fax: [phone]   RxID:   [id number]   Appended Document: Abdominal pain please call him and start Colestid 1 g q.a.m. 2 hours apart from other meds.  Appended Document: Abdominal pain pt aware   Clinical Lists Changes  Medications: Added new medication of COLESTID 1 GM TABS (COLESTIPOL HCL) take one by mouth every mornign 2 hours away from all other medications - Signed Rx of COLESTID 1 GM TABS (COLESTIPOL HCL) take one by mouth every mornign 2 hours away from all other medications;  #30 x 6;  Signed;  Entered by: Harlow Mares CMA (AAMA);  Authorized by: Mardella Layman MD [facility];  Method used: Electronically to [facility]. #[id number]*, [address] [place], [place], [place]  [address], Ph: [phone], Fax: [phone]    Prescriptions: COLESTID 1 GM TABS (COLESTIPOL HCL) take one by mouth every mornign 2 hours away from all other medications  #30 x 6   Entered by:   Harlow Mares CMA (AAMA)   Authorized by:   Mardella Layman MD [facility]   Signed by:   Harlow Mares  CMA (AAMA) on 11/09/2010   Method used:   Electronically to        [facility]. [phone]* (retail)       [address]       [place], [place]  [address]       Ph: [phone]       Fax: [phone]   RxID:   [id number]

## 2010-11-14 NOTE — Progress Notes (Signed)
Summary: Triage   Phone Note Call from Patient   Caller: Patient Call For: Dr. Jarold Motto Reason for Call: Talk to Nurse Summary of Call: Having abd pain and requesting sooner appt. than next avail. He is on a BT and concerned he may have abd bleeding Initial call taken by: Karna Christmas,  November 07, 2010 1:44 PM  Follow-up for Phone Call        Patient reports increasing abdominal pain since he changed blood thinners in January 2012. He reports a TIA in August 2010 and was on Coumadin until Sept. 2011, and then  Pradaxa. The Pradaxa caused abdominal pain so in January, 2012 he was placed on Xarel. Patient stated Dr Graciela Husbands suggested Dr Jarold Motto see him about the abdominal pain. Patient given an appointment on 11/09/10.  Follow-up by: Graciella Freer RN,  November 07, 2010 3:23 PM

## 2010-11-15 ENCOUNTER — Ambulatory Visit (HOSPITAL_COMMUNITY)
Admission: RE | Admit: 2010-11-15 | Discharge: 2010-11-15 | Disposition: A | Discharge status: Home / Self Care | Payer: Medicare Other | Source: Ambulatory Visit | Attending: Gastroenterology | Admitting: Gastroenterology

## 2010-11-15 DIAGNOSIS — Z9089 Acquired absence of other organs: Secondary | ICD-10-CM | POA: Insufficient documentation

## 2010-11-15 DIAGNOSIS — R1084 Generalized abdominal pain: Secondary | ICD-10-CM | POA: Insufficient documentation

## 2010-11-15 DIAGNOSIS — R7402 Elevation of levels of lactic acid dehydrogenase (LDH): Secondary | ICD-10-CM | POA: Insufficient documentation

## 2010-11-15 DIAGNOSIS — R7989 Other specified abnormal findings of blood chemistry: Secondary | ICD-10-CM

## 2010-11-15 DIAGNOSIS — R7401 Elevation of levels of liver transaminase levels: Secondary | ICD-10-CM | POA: Insufficient documentation

## 2010-11-23 LAB — CARBOHYDRATE DEFICIENT TRANSFERRIN: CDT: 2.8 % — ABNORMAL HIGH (ref <=1.6)

## 2010-12-09 LAB — DIFFERENTIAL
Basophils Absolute: 0 10*3/uL (ref 0.0–0.1)
Lymphocytes Relative: 27 % (ref 12–46)
Monocytes Absolute: 1 10*3/uL (ref 0.1–1.0)
Neutro Abs: 4.5 10*3/uL (ref 1.7–7.7)
Neutrophils Relative %: 58 % (ref 43–77)

## 2010-12-09 LAB — COMPREHENSIVE METABOLIC PANEL
Albumin: 4.2 g/dL (ref 3.5–5.2)
BUN: 13 mg/dL (ref 6–23)
Chloride: 101 mEq/L (ref 96–112)
Creatinine, Ser: 0.84 mg/dL (ref 0.4–1.5)
Total Bilirubin: 1.8 mg/dL — ABNORMAL HIGH (ref 0.3–1.2)
Total Protein: 7.2 g/dL (ref 6.0–8.3)

## 2010-12-09 LAB — CBC
MCHC: 32.8 g/dL (ref 30.0–36.0)
MCV: 101 fL — ABNORMAL HIGH (ref 78.0–100.0)
RDW: 17.9 % — ABNORMAL HIGH (ref 11.5–15.5)

## 2010-12-09 LAB — CK TOTAL AND CKMB (NOT AT ARMC)
CK, MB: 3.6 ng/mL (ref 0.3–4.0)
Relative Index: 3.3 — ABNORMAL HIGH (ref 0.0–2.5)

## 2010-12-09 LAB — LIPID PANEL
LDL Cholesterol: 101 mg/dL — ABNORMAL HIGH (ref 0–99)
VLDL: 24 mg/dL (ref 0–40)

## 2010-12-09 LAB — PROTIME-INR: INR: 1 (ref 0.00–1.49)

## 2010-12-09 LAB — TROPONIN I: Troponin I: 0.01 ng/mL (ref 0.00–0.06)

## 2010-12-09 LAB — APTT: aPTT: 27 seconds (ref 24–37)

## 2010-12-14 LAB — COMPREHENSIVE METABOLIC PANEL
ALT: 22 U/L (ref 0–53)
Alkaline Phosphatase: 52 U/L (ref 39–117)
BUN: 10 mg/dL (ref 6–23)
CO2: 28 mEq/L (ref 19–32)
Chloride: 102 mEq/L (ref 96–112)
Glucose, Bld: 110 mg/dL — ABNORMAL HIGH (ref 70–99)
Potassium: 4.5 mEq/L (ref 3.5–5.1)
Sodium: 136 mEq/L (ref 135–145)
Total Bilirubin: 1.4 mg/dL — ABNORMAL HIGH (ref 0.3–1.2)

## 2010-12-14 LAB — CBC
HCT: 37.2 % — ABNORMAL LOW (ref 39.0–52.0)
Hemoglobin: 12.2 g/dL — ABNORMAL LOW (ref 13.0–17.0)
RBC: 3.59 MIL/uL — ABNORMAL LOW (ref 4.22–5.81)
RDW: 15.6 % — ABNORMAL HIGH (ref 11.5–15.5)
WBC: 6.1 10*3/uL (ref 4.0–10.5)

## 2010-12-19 ENCOUNTER — Ambulatory Visit (INDEPENDENT_AMBULATORY_CARE_PROVIDER_SITE_OTHER): Payer: Medicare Other | Admitting: Gastroenterology

## 2010-12-19 ENCOUNTER — Encounter: Payer: Self-pay | Admitting: Gastroenterology

## 2010-12-19 VITALS — BP 114/60 | HR 72 | Ht 72.0 in | Wt 198.8 lb

## 2010-12-19 DIAGNOSIS — R197 Diarrhea, unspecified: Secondary | ICD-10-CM

## 2010-12-19 DIAGNOSIS — K709 Alcoholic liver disease, unspecified: Secondary | ICD-10-CM

## 2010-12-19 DIAGNOSIS — Z9049 Acquired absence of other specified parts of digestive tract: Secondary | ICD-10-CM

## 2010-12-19 DIAGNOSIS — T8189XA Other complications of procedures, not elsewhere classified, initial encounter: Secondary | ICD-10-CM

## 2010-12-19 DIAGNOSIS — Z860100 Personal history of colon polyps, unspecified: Secondary | ICD-10-CM

## 2010-12-19 LAB — DIFFERENTIAL
Basophils Relative: 1 % (ref 0–1)
Lymphs Abs: 0.9 10*3/uL (ref 0.7–4.0)
Monocytes Relative: 14 % — ABNORMAL HIGH (ref 3–12)
Neutro Abs: 6.8 10*3/uL (ref 1.7–7.7)
Neutrophils Relative %: 74 % (ref 43–77)

## 2010-12-19 LAB — OVA AND PARASITE EXAMINATION

## 2010-12-19 LAB — HEPATIC FUNCTION PANEL
ALT: 75 U/L — ABNORMAL HIGH (ref 0–53)
ALT: 58 U/L — ABNORMAL HIGH (ref 0–53)
AST: 52 U/L — ABNORMAL HIGH (ref 0–37)
Albumin: 3.2 g/dL — ABNORMAL LOW (ref 3.5–5.2)
Albumin: 3.7 g/dL (ref 3.5–5.2)
Alkaline Phosphatase: 116 U/L (ref 39–117)
Alkaline Phosphatase: 75 U/L (ref 39–117)
Alkaline Phosphatase: 89 U/L (ref 39–117)
Alkaline Phosphatase: 89 U/L (ref 39–117)
Alkaline Phosphatase: 98 U/L (ref 39–117)
Bilirubin, Direct: 1.3 mg/dL — ABNORMAL HIGH (ref 0.0–0.3)
Bilirubin, Direct: 1.6 mg/dL — ABNORMAL HIGH (ref 0.0–0.3)
Bilirubin, Direct: 1.9 mg/dL — ABNORMAL HIGH (ref 0.0–0.3)
Bilirubin, Direct: 0.6 mg/dL — ABNORMAL HIGH (ref 0.0–0.3)
Indirect Bilirubin: 1.5 mg/dL — ABNORMAL HIGH (ref 0.3–0.9)
Indirect Bilirubin: 1.8 mg/dL — ABNORMAL HIGH (ref 0.3–0.9)
Indirect Bilirubin: 1.8 mg/dL — ABNORMAL HIGH (ref 0.3–0.9)
Indirect Bilirubin: 1.2 mg/dL — ABNORMAL HIGH (ref 0.3–0.9)
Total Bilirubin: 3.1 mg/dL — ABNORMAL HIGH (ref 0.3–1.2)
Total Bilirubin: 3.3 mg/dL — ABNORMAL HIGH (ref 0.3–1.2)
Total Bilirubin: 3.4 mg/dL — ABNORMAL HIGH (ref 0.3–1.2)
Total Bilirubin: 3.4 mg/dL — ABNORMAL HIGH (ref 0.3–1.2)
Total Bilirubin: 3.9 mg/dL — ABNORMAL HIGH (ref 0.3–1.2)
Total Protein: 6.3 g/dL (ref 6.0–8.3)
Total Protein: 6.6 g/dL (ref 6.0–8.3)
Total Protein: 6.8 g/dL (ref 6.0–8.3)
Total Protein: 6 g/dL (ref 6.0–8.3)

## 2010-12-19 LAB — CLOSTRIDIUM DIFFICILE EIA

## 2010-12-19 LAB — BASIC METABOLIC PANEL
BUN: 11 mg/dL (ref 6–23)
CO2: 27 mEq/L (ref 19–32)
Chloride: 98 mEq/L (ref 96–112)
Creatinine, Ser: 0.83 mg/dL (ref 0.4–1.5)
Glucose, Bld: 133 mg/dL — ABNORMAL HIGH (ref 70–99)

## 2010-12-19 LAB — URINE CULTURE: Special Requests: NEGATIVE

## 2010-12-19 LAB — STOOL CULTURE

## 2010-12-19 LAB — CULTURE, BLOOD (ROUTINE X 2)

## 2010-12-19 LAB — URINALYSIS, ROUTINE W REFLEX MICROSCOPIC
Glucose, UA: NEGATIVE mg/dL
Hgb urine dipstick: NEGATIVE
Nitrite: POSITIVE — AB

## 2010-12-19 LAB — FECAL LACTOFERRIN, QUANT

## 2010-12-19 LAB — PROTIME-INR
Prothrombin Time: 13.7 seconds (ref 11.6–15.2)
Prothrombin Time: 13.8 seconds (ref 11.6–15.2)
Prothrombin Time: 14.1 seconds (ref 11.6–15.2)

## 2010-12-19 LAB — CBC
HCT: 38.3 % — ABNORMAL LOW (ref 39.0–52.0)
MCHC: 34.2 g/dL (ref 30.0–36.0)
MCHC: 34.6 g/dL (ref 30.0–36.0)
MCV: 106.7 fL — ABNORMAL HIGH (ref 78.0–100.0)
Platelets: 233 10*3/uL (ref 150–400)
RBC: 3.57 MIL/uL — ABNORMAL LOW (ref 4.22–5.81)
RBC: 3.59 MIL/uL — ABNORMAL LOW (ref 4.22–5.81)
WBC: 8.8 10*3/uL (ref 4.0–10.5)
WBC: 9.2 10*3/uL (ref 4.0–10.5)

## 2010-12-19 LAB — ETHANOL: Alcohol, Ethyl (B): <5 mg/dL (ref 0–10)

## 2010-12-19 LAB — LIPASE, BLOOD: Lipase: 28 U/L (ref 11–59)

## 2010-12-19 LAB — URINE MICROSCOPIC-ADD ON

## 2010-12-19 NOTE — Patient Instructions (Signed)
Call back in one month to set up your endo/colon with [facility].

## 2010-12-19 NOTE — Progress Notes (Signed)
History of Present Illness: This is a 70 year old Caucasian male with chronic alcoholic liver disease who is stable in terms of deterioration in his liver function test were slightly increased transaminases slightly elevated prothrombin time. Does have post cholecystectomy diarrhea managed on daily Colestid 1 g and daily probiotics. He was on Pradaxa is possibly caused him GI distress and this has been discontinued and he currently is on Rivaroxaban were stable atrial fibrillation. He did use alcohol daily but denies problems with memory recall, nausea vomiting, or systemic complaints. He is due for endoscopy and colonoscopy but his travel are broad and would like to do these labs in June. He does have a history of colon polyps with previous incomplete colonoscopy because of a large ventral hernia. He will need propofol sedation.   Current Medications, Allergies, Past Medical History, Past Surgical History, Family History and Social History were reviewed in [employer] record.   Assessment and plan: Continue all current medications as listed and reviewed. Moderation of alcohol intake again advise with discontinuation if possible. He is to call for scheduling of his endoscopy and colon endoscopy with propofol sedation. No diagnosis found.  Please copy her primary care physician, referring physician, and pertinent subspecialists.

## 2011-01-16 NOTE — Consult Note (Signed)
NAMEDOUGLASS, DUNSHEE NO.:  [id number]   MEDICAL RECORD NO.:  [id number]          PATIENT TYPE:  EMS   LOCATION:  ED                           FACILITY:  [facility]   PHYSICIAN:  Veverly Fells. Excell Seltzer, MD  DATE OF BIRTH:  12/16/1940   DATE OF CONSULTATION:  DATE OF DISCHARGE:  04/15/2009                                 CONSULTATION   REASON FOR CONSULTATION:  Transient ischemic attack in the setting of  permanent atrial fibrillation.   HISTORY OF PRESENT ILLNESS:  Mr. Chui is a 70 year old gentleman with an  approximate 20-year history of atrial fibrillation.  He presented today  [place]  His symptoms  had an abrupt onset.  They were not associated with any focal weakness.  A code stroke was called and the patient was evaluated by Dr. Vickey Huger  with Neurology.  At that point in time, his symptoms were improving.  He  currently denies any symptoms.  His speech and word-finding have  completely normalized.  He denies any focal weakness, numbness, or  tingling.  He has no visual changes.   The patient has been on long-term anticoagulation with Coumadin over a 3-  year period in the past.  However, he has been off Coumadin now for  several years.  His CHADS score has been 0 prior to today.  He has  undergone cardiac catheterization in the 1990s and tells me that he had  normal coronary arteries and normal heart function at that time.  I  cannot find records of an echocardiogram in the past.  The patient also  has not been taking aspirin regularly, but it is part of his medication  list.  He denies any recent chest pain, shortness of breath,  palpitations, or other complaints.   The patient has a history of heavy alcohol use.  He has had alcoholic  liver disease.  He currently has cut back on alcohol consumption and is  drinking 2-3 glasses of wine daily.   CURRENT MEDICATIONS:  1. Allopurinol 300 mg daily.  2. Darvocet-N  100 as needed.  3. Metoprolol 50 mg daily.  4. Zetia 10 mg daily.  5. Aspirin, taken sporadically.   ALLERGIES:  The patient is STATIN intolerant secondary to elevated LFTs.   PAST MEDICAL HISTORY:  1. Permanent atrial fibrillation.  2. Hyperlipidemia.  3. Alcohol abuse.  4. Cholecystectomy this spring   SOCIAL HISTORY:  The patient has worked in the [employer].  He is  retired.  He coughs regularly.  He is an active gentleman.  He drinks 2-  3 glasses of wine per day and has a history of heavy alcohol use.  He  smokes cigars on occasion.   REVIEW OF SYSTEMS:  Complete review of systems was performed and is  negative except as per HPI.   FAMILY HISTORY:  Negative for stroke.   PHYSICAL EXAMINATION:  GENERAL:  The patient is alert and oriented.  He  is pleasant gentleman in no acute distress.  VITAL SIGNS:  Heart rate 67, respiratory rate  16, blood pressure 146/79.  He is afebrile with temperature 97.9.  HEENT:  Normal.  NECK:  Normal carotid upstrokes.  No bruits.  JVP normal.  No  thyromegaly or thyroid nodules.  LUNGS:  Clear to auscultation bilaterally.  HEART:  The apex is discreet and nondisplaced.  The heart is irregularly  irregular without murmurs or gallops.  There is no right ventricular  heave or lift.  ABDOMEN:  Soft, nontender, no organomegaly, no abdominal bruits.  EXTREMITIES:  No clubbing, cyanosis or edema.  Peripheral pulses are  intact and equal.  SKIN:  Warm and dry without rash.  LYMPHATICS:  No adenopathy.  NEUROLOGIC:  Cranial nerves II-XII are intact.  Strength intact and  equal bilaterally.   EKG shows atrial fibrillation and is otherwise within normal limits.   Echocardiogram results were called to me by Dr. Shirlee Latch.  The patient has  normal left ventricular systolic function with severe biatrial  enlargement and no significant valvular disease.  He has aortic  sclerosis.   LABORATORY FINDINGS:  Essentially unremarkable with negative  cardiac  biomarkers, creatinine 0.8, potassium 4.1, hemoglobin 15.1, platelet  count 207,000.   ASSESSMENT:  This is a 70 year old gentleman with permanent atrial  fibrillation, who presented today with a TIA.  His 2-D echo showed  normal LV function, but very large atria.  Clearly, the patient should  be on aspirin 325 mg daily at a minimum.  I would favor Coumadin in this  gentleman with a TIA and permanent atrial fib.  Regardless of his CHADS  score, which is now 2, I think his long-term stroke risk is high with  this recent event.  The benefit of long-term Coumadin has to be weighed  in the setting of his alcohol use.  I will recommend that he decrease or  abstain from alcohol altogether.  I discussed his case with Dr. Vickey Huger  in detail.  Of note, his neurologic imaging, which included a CT of the  brain and MRI of the brain, demonstrated no evidence of acute  infarction.  He did have incidentally noted vertebrobasilar stenosis.   The patient will follow up with Dr. Graciela Husbands as an outpatient.      Veverly Fells. Excell Seltzer, MD  Electronically Signed     MDC/MEDQ  D:  04/15/2009  T:  04/16/2009  Job:  [id number]   cc:   Duke Salvia, MD, [facility]  Sean A. Everardo All, MD  Vania Rea. Jarold Motto, MD, [facility], FAGA

## 2011-01-16 NOTE — Discharge Summary (Signed)
Nicholas Bird, Bird NO.:  [id number]   MEDICAL RECORD NO.:  [id number]          PATIENT TYPE:  INP   LOCATION:  1423                         FACILITY:  [facility]   PHYSICIAN:  Nicholas T. Russella Dar, MD, FACGDATE OF BIRTH:  [birth year]   DATE OF ADMISSION:  10/08/2008  DATE OF DISCHARGE:  10/13/2008                               DISCHARGE SUMMARY   ADMITTING DIAGNOSES:  35. A 70 year old male with chronic ETOH use now with 3-day history of      fever, diarrhea, nausea, and abnormal liver function studies with      jaundice, rule out gastroenteritis, rule out possible C. diff      colitis, rule out alcohol-induced hepatitis, rule out gallbladder      disease.  2. Alcoholic liver disease but no previously documented cirrhosis.  3. Left femoral hernia, recurrent, symptomatic but not incarcerated.  4. Chronic atrial fibrillation.  5. Hypertension  6. Hyperlipidemia.  7. History of gout.   DISCHARGE DIAGNOSES:  69. A 70 year old white male presenting with fever, gastrointestinal      upset, diarrhea, and nausea with elevated LFTs and jaundice, found      to have choledocholithiasis, now status post endoscopic retrograde      cholangiopancreatography and stone extraction, October 12, 2008,      x4.  2. Fever, resolved, rule out secondary to gastroenteritis versus      component of cholangitis, cultures negative.  3. 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  4. ETOH withdrawal with no evidence for delirium tremens.  5. Chronic atrial fibrillation.  6. Hypertension  7. Hyperlipidemia.  8. History of gout.   CONSULTATIONS:  None.   PROCEDURES:  1. CT scan of the abdomen and pelvis.  2. ERCP with stone extraction.   BRIEF HISTORY:  Nicholas Bird is a 70 year old white male known to Dr.  Jarold Bird, also Nicholas Bird with history as described above.  He had  undergone a liver biopsy in 2008 which showed evidence of steatosis but  no significant inflammation or  evidence of cirrhosis.  He continues at  this time with daily alcohol use though had been advised against this.  He is currently drinking 5 to 6 alcoholic beverages per day.  He was  seen in the office on October 07, 2008, by Nicholas Bird, had had onset  of general malaise, etc., October 03, 2008, and then on October 06, 2008, developed a fever to 103 at home associated with chills, abdominal  grumbling, cramping, and diarrhea.  He has had nausea but had not been  having any vomiting until the evening prior to admission.  He denies any  abdominal pain per se, has had more in the way of cramping and  grumbling.  He does have a known left femoral hernia which is  symptomatic, had been seen at the surgery office on October 07, 2008.  Hernia was not felt to be incarcerated and he is waiting for an  appointment with Nicholas Bird to discuss laparoscopic hernia repair.  Labs  done in our office on October 07, 2008,  showed a bilirubin of 6.5,  direct bili 4.2, alk phos 88, SGOT of 296, SGPT of 275, INR was 1.4,  venous ammonia slightly elevated at 46.  Today, he was stating that he  feels a little bit better.  His fever had been up to 101.5 at home and  he continues to have diarrhea which he says is liquid, small volume with  12 to 15 bowel movements per day.  His appetite has been decreased and  he had 1 episode of vomiting the night prior to admission.  His last  drink was October 05, 2008.  He has not had any recent antibiotic use.  No new medications are known.  No infectious exposures.  He was admitted  at this time for supportive medical management and further diagnostic  evaluation.   LABORATORY STUDIES:  On October 08, 2008, sed rate of 22, total  bilirubin 4.1, direct bili 2.5, indirect bili 1.6, alk phos is 89, SGOT  149, SGPT of 196.  Amylase and lipase were normal.  ETOH level less than  5.  UA, 3 to 6 WBCs, 0 to 2 RBCs.  Blood cultures were negative.  Urine  culture negative.  Stool  for C. diff negative.  Stool culture negative.  Stool for lactoferrin positive.  Stool for O and P negative.  Serial  labs were done on October 10, 2008.  Pro time was 14.1, INR of 1.1.  On  October 12, 2008, WBC 9.2, hemoglobin 13.1, hematocrit of 37.9, MCV of  106, platelet count 233, total bilirubin 3.4, direct bilirubin 1.6,  indirect 1.8, alk phos 105, SGOT 52, SGPT 77, albumin of 3.2.  Followup  on October 13, 2008, total bilirubin of 3.1, direct bili 1.3, alk phos  116, SGOT 64, SGPT 75, albumin 3.1.   HOSPITAL COURSE:  Patient was admitted to the hospital on October 08, 2008.  Started on IV fluids, had baseline labs obtained, was started on  IV Cipro 400 q.12.  Stool cultures were obtained and he was scheduled  for a CT scan of the abdomen and pelvis.  CT scan showed some  atherosclerotic calcification of the abdominal aorta and common iliac  arteries.  There were numerous tiny calcified gallstones in the  gallbladder.  There was some high attenuation material noted in the  distal common bile duct within the pancreatic head.  Tiny calcification  also noted in the head of the pancreas.  Numerous areas of low  attenuation in the liver consistent with fatty infiltration.  Liver size  upper limits of normal and irregular.  Portal vein widely patent.  No  evidence of varices.  Spleen size normal.  Pancreas unremarkable.  Scattered mildly enlarged lymph nodes in the hepatoduodenal ligament and  porta hepatis.  No ascites.  CT findings suggestive of early hepatic  cirrhosis, marked prostate gland enlargement, left inguinal hernia  containing a portion of the proximal sigmoid colon.  No bowel wall  thickening or evidence of obstruction.  Patient had been placed on an  Ativan withdrawal protocol.  He tolerated this well and did not have any  evidence of overt withdrawal symptoms while he was hospitalized.  He  completed the taper.  His fever resolved with IV antibiotics.  Cultures  were  Bird negative including stool cultures.  LFTs remained elevated and  with the finding of probable common bile duct stones on CT scan plans  were made for ERCP.  This was performed per Dr. Russella Bird on October 12, 2008.  He was found to have 4 small CBD stones in the 3- to 5-mm size  range.  These were removed after sphincterotomy.  The patient tolerated  the procedure well, had no discomfort postprocedure.  Diet has been  advanced and he has now been afebrile over the past 72 hours or so.  On  October 13, 2008, he is felt stable for discharge to home.  He seems to  be committed to stopping his alcohol use and at this point is giving  consideration to an inpatient rehab center in [place].  For the time  being, he will be discharged to home.  He has an appointment to see Dr.  Michaell Bird in his office on October 13, 2008.  We have recommended a  laparoscopic cholecystectomy. Timing of hernia repair and  cholecystectomy to be determined per Nicholas Bird. He will be discharged on  a low-fat diet.  He has followup with Nicholas Bird on October 21, 2008, at 10 a.m.   NEW MEDICATION:  Trazodone 50 mg q.h.s.   He will continue:  1. Align 1 p.o. daily.  2. Allopurinol 300 daily.  3. Metoprolol 50 b.i.d.  4. Zetia 10 mg daily.  5. Darvocet-N 100 as needed.  6. Aspirin 81 mg daily.      Amy Esterwood, PA-C      Nicholas T. Russella Dar, MD, [facility]  Electronically Signed    AE/MEDQ  D:  10/13/2008  T:  10/13/2008  Job:  [id number]   cc:   Ardeth Sportsman, MD  [address] [place] [address]  [place]

## 2011-01-16 NOTE — Consult Note (Signed)
NAME:  Nicholas Bird, Nicholas Bird NO.:  [id number]   MEDICAL RECORD NO.:  [id number]          PATIENT TYPE:  EMS   LOCATION:  ED                           FACILITY:  [facility]   PHYSICIAN:  Melvyn Novas, M.D.  DATE OF BIRTH:  1941-08-13   DATE OF CONSULTATION:  04/15/2009  DATE OF DISCHARGE:                                 CONSULTATION   CODE STROKE:  This gentleman a 70 year old Caucasian, right-handed male  who at about a quarter of one today, had lunch with friends that had  previously been playing golf with him.  He is a healthy-appearing,  relatively slender male, well-groomed, who presents after a sudden spell  of aphasia.  Apparently he could not complete his sentences, finally  became almost mute.  The patient denies having had trouble understanding  or comprehending, but he could not get the words out, he states.  This  recovered within 30-40 minutes after onset and he has now been able to  speak much more fluent, although he is still looking for certain words  and names, but he follows commands and can speak in full sentences and  not just answer closed questions.  The patient states that he has no  significant past medical history but it was clear from the EKG rhythm  strips that he was in atrial fibrillation.  When asked about this, he  states that he had atrial fibrillation for 10 or 12 years now, is  followed by a [place] cardiologist, whom he cannot name.  He never had a  TIA or stroke-like symptoms before, he states.  He is on __________  Toprol, supposedly on aspirin, but he confesses that he does not take it  regularly.  Allopurinol for gout.  Align to reduce his hyperlipidemia  and Darvocet p.r.n.   PAST MEDICAL HISTORY:  Positive for cholecystectomy after a gallbladder  or gallstone infection, gout, hyperlipidemia, atrial fibrillation and he  has, according to his medical record, panic attacks, but denies having  any claustrophobia or anxiety now.  His  blood pressure is 146/79,  temperature is 97.9 degrees, Fahrenheit; heart rate irregular, between  81 and 69 beats per minute.  The patient's deficits by now have resolved  to a level where he reaches two points on the NIH stroke scale, one for  level of consciousness because he is somewhat drowsy, and one for the  aphasia symptoms.  He has no focal sensory or motor deficits.   SOCIAL HISTORY:  The patient denies being a smoker, but has been  drinking alcohol fairly regularly.  As to his family situation, he has  not given any details to his marital status and I was not able to obtain  a family history before he had to go for the MRI scan.   PHYSICAL EXAM:  Shows a gentleman with clear lungs, no cardiac murmur,  no carotid bruits.  Normal temporal arteries, not indurated.  Peripheral  pulses are palpable but irregular.  The patient has no edema, clubbing  or cyanosis.  No bruising.  No rash.  No nonhealing wounds.  ABDOMEN:  Soft, nontender.  He has recovered from his gallbladder surgery.  The  patient to denies having had any pacemaker or metal parts planted.  He  also is not a stent patient, he states.  As to his mental status, he is  able to state that he is in the hospital, but he named [place]  instead of [employer].  He is also able to state his birthday and he  knows his address.  The patient is oriented to the date and time and  person.   Still nonfluent, his aphasia appears to be much less than when Dr.  Earlyne Iba evaluated him first, since the patient could not answer  his questions at all at the time.  The recovery or resolution of his  symptoms seemed to have been going on over the last 30 minutes.  The  patient has normal motor function, extends all extremities against  gravity.  No drift.  No pronator drift.  Finger-nose-finger test is  clear.  Full extraocular movements.  Normal papular __________ .  Tongue  and uvula midline.  Facial symmetry is preserved.   Facial sensory is  preserved.  Peripheral sensory is preserved to  primary and secondary  stimuli.  The patient also has no problem sitting up straight in bed.  He denies any pain, discomfort, fever, cramping, chills, urinary  problems, productive cough or any discharges.  He confessed that he was  not taking his aspirin regularly.  He has been on Coumadin once, he  states, and after two years had not wanted to continue with that  medication, but does not state a specific reason.  His last  hospitalization was the cholecystectomy named above.   The patient is not a TPA candidate because of his fairly rapid response  and his residual NIH stroke scale is only 2 points.  I think he will  probably clear completely today.  Because of a possible low density area  in the central pons, seen on CT scan, an MRI/MRA has been added.  I have  asked the [facility]iology colleagues to see the patient for a  possible 2-D echo.  The atrial fibrillation is the most likely cause for  this patient's TIA and it should be a left cortical branch that was  affected, something that has not been visible on the CAT scan.   LABS:  He had an INR of 1.0 and MCV is elevated at 101, which can be  related to excessive alcohol intake or vitamin B and iron deficiency.  He has normal hemoglobin/hematocrit values.  His chem7 has not returned  yet.  An ammonia was within normal limits.   PLAN:  To admit the patient for a TIA workup, possible heparin until  Coumadin reaches a therapeutic INR should be considered but will be  discussed with the cardiologist on call.  I would like to have this  patient seen at the [facility] since the stroke service is  available there.      Melvyn Novas, M.D.  Electronically Signed     CD/MEDQ  D:  04/15/2009  T:  04/15/2009  Job:  [id number]

## 2011-01-16 NOTE — H&P (Signed)
NAMEGRAYLING, SCHRANZ NO.:  [id number]   MEDICAL RECORD NO.:  [id number]          PATIENT TYPE:  INP   LOCATION:  1423                         FACILITY:  [facility]   PHYSICIAN:  Rachael Fee, MD   DATE OF BIRTH:  1940-11-12   DATE OF ADMISSION:  10/08/2008  DATE OF DISCHARGE:                              HISTORY & PHYSICAL   REASON FOR ADMISSION:  Fevers to 103, abnormal LFTs, diarrhea and  abdominal pain.   HISTORY:  Nicholas Bird is a 70 year old white male known to Dr.  Jarold Motto, also to Dr. Everardo All, who has a history of EtOH-induced  chronic liver disease.  He did undergo a liver biopsy in 2008 which  showed steatosis but no significant inflammation or evidence of  cirrhosis.  The patient does continue to use alcohol daily, though he  has been advised against this.  He is currently consuming 5-6 alcoholic  beverages per day.  He was seen by Dr. Jarold Motto in the office on  October 07, 2008, with onset of generally feeling poorly on January  31st, and then on February 3rd he developed fever to 103 at home  associated with chills, abdominal grumbling, cramping and diarrhea.  He  has had vague associated nausea, had not vomited until last evening.  He  at this point says he is not really having abdominal pain, per se, but  more abdominal cramping.  He also has a known large left femoral hernia  which is symptomatic.  He was seen by surgery yesterday as well, not  felt to have an incarcerated hernia, and is hoping for a laparoscopic  hernia repair in the near future.  Labs were done yesterday which show a  bilirubin of 6.5, direct bilirubin 4.2, alkaline phosphatase 88, SGOT of  296 and an SGPT of 275.  White count 11.5, hemoglobin 14.8, platelets  low at 134, INR is 1.4, albumin 3.9, sedimentation rate 20, his MCV is  106, venous ammonia is elevated at 46.  He was placed empirically on  initially Cipro and Flagyl but did not take the Flagyl more than 1 or 2  dosages and has been taking the Cipro.  Today, he says he feels a little  bit better than yesterday, his fever has been up as high as 101.5 in the  past 24 hours.  He continues to have diarrhea, which he says is small  volume, 12-15 times yesterday, nonbloody liquid stool.  His appetite has  been decreased, he vomited once last night.  He denies any recent  antibiotic use, he has not been on any new medications, has not had any  infectious exposures.  His last drink was October 05, 2008.  At this  time he is admitted for further diagnostic evaluation and medical  management.   PAST HISTORY:  1. Pertinent for a left inguinal hernia repair in 2002 which has      recurred.  2. Chronic atrial fibrillation.  3. History of coronary artery disease.  4. Hyperlipidemia.  5. Hypertension.  6. Cataracts.  7. Gout.   CURRENT MEDICATIONS:  1. Metoprolol 50 b.i.d.  2. Allopurinol 300 daily.  3. Zetia 10 daily.  4. Welchol p.r.n., says he is not using currently.  5. Ecotrin 81 mg daily.  6. Cipro 500 b.i.d. which was started on February 3rd I believe.  7. Darvocet N-100 as needed.   ALLERGIES:  No known drug allergies.   FAMILY HISTORY:  Mother with lung cancer.  No GI disease in the family  that he is aware of.   SOCIAL HISTORY:  The patient is married, he is retired, he has a  nonsmoker, does drink alcohol daily 5 per day generally.   REVIEW OF SYSTEMS:  GENERAL:  He says that he has been weak over the  past few days.  He has been noted to be jaundiced.  His urine has been a  bit dark.  GI: As outlined above.  RESPIRATORY:  Denies any cough,  shortness of breath or sputum production.  CARDIOVASCULAR:  History of  chronic atrial fibrillation, no chest pain or anginal symptoms.  MUSCULOSKELETAL:  Denies.  NEURO-PSYCH:  Negative.  He does admit to  some increased anxiety over the past 2 days off EtOH.  All other review  of systems negative.   Labs done February 4th as outlined above.   TSH was 1.13, B12 598, folate  9.2, iron 23, TIBC of 181, creatinine is 1.2, potassium 3.6, sodium 134.   X-RAY STUDIES:  Are pending.   PHYSICAL EXAM:  A well-developed white male in no acute distress.  He is  alert and oriented x3.  Temperature is 98.1, pulse in the 70s, blood  pressure 145/91, O2 sat is 100 on room air.  HEENT:  Nontraumatic, normocephalic.  EOMI, PERRLA.  Sclerae are  icteric.  NECK:  Supple and without nodes.  There is no JVD.  CARDIOVASCULAR:  Irregular rate and rhythm with S1-S2.  No murmur, rub  or gallop.  LUNGS:  Clear to A and P.  ABDOMEN:  Soft.  There is no focal tenderness.  Bowel sounds are active.  Liver is enlarged with right lobe palpable 3-4 fingerbreadths below the  right costal margin.  There is no fluid wave.  RECTAL:  Not done today.  He was Hemoccult negative in the office  yesterday.  NEURO:  Alert and oriented x3, grossly nonfocal.  There is no asterixis  and no tremor.   IMPRESSION:  73. A 70 year old white male with chronic alcohol use now with 3-day      history of fevers, GI upset diarrhea, nausea and abnormal liver      function studies with jaundice; rule out gastroenteritis, rule out      Clostridium difficile, rule out alcoholic hepatitis, rule out      gallbladder disease.  2. Alcoholic liver disease but no previously documented cirrhosis.  3. Left femoral hernia, recurrent, symptomatic but not incarcerated.  4. Chronic atrial fibrillation.  5. Hypertension.  6. Hyperlipidemia.  7. History of gout.   PLAN:  The patient is admitted to the hospital for IV fluid hydration,  baseline labs.  We will check serial LFTs and INR, schedule for a CT  scan of the abdomen and pelvis today.  He will also obtain stool  cultures and blood cultures.  Continue him on IV Cipro in the short-  term.  He will be placed on an Ativan withdrawal protocol and for  further details please see the orders.      Amy Esterwood, PA-C      Daniel P.  Christella Hartigan, MD  Electronically Signed    AE/MEDQ  D:  10/08/2008  T:  10/08/2008  Job:  [id number]   cc:   Rachael Fee, MD  [address]  [place], [place] [address]

## 2011-01-16 NOTE — Assessment & Plan Note (Signed)
[facility]                         ELECTROPHYSIOLOGY OFFICE NOTE   NAME:[name], [name]                        MRN:          [id number]  DATE:07/10/2007                            DOB:          [birth year]    Nicholas Bird comes in for his atrial fibrillation.  He is doing well, he has  had no intercurrent symptoms.  He has had no problem with chest pain or  shortness of breath.   He remains quite dizzy.   MEDICATIONS:  Metoprolol 50 b.i.d., Allopurinol and Welchol.   PHYSICAL EXAMINATION:  His blood pressure is 129/82, his pulse is 87.  His lungs are clear.  Heart sounds are irregular.  The extremities are  without edema.   IMPRESSION:  Atrial fibrillation.   Nicholas Bird is stable. We will plan to see him again in 1 year's time.   I should note that we discussed catheter ablation and I think in the  absence of mortality benefit and the absence of symptoms that there is  no indication for doing anything differently.     Duke Salvia, MD, [facility]  Electronically Signed    SCK/MedQ  DD: 07/10/2007  DT: 07/10/2007  Job #: [id number]

## 2011-01-16 NOTE — Assessment & Plan Note (Signed)
[facility]                         ELECTROPHYSIOLOGY OFFICE NOTE   NAME:[name], Nicholas Bird                        MRN:          [id number]  DATE:09/16/2008                            DOB:          [birth year]    Nicholas Bird is seen in followup for atrial fibrillation.  He remains  asymptomatic.  I am surprised to find not on aspirin.  This may not be  unreasonable in the setting of an age of 10 and the CHADS score of zero.  His CHADS best score would be only 1, which would be associated with his  risk of 0.6%.   He also has a dyslipidemia with an LDL of 165.  He has been on Zetia  without getting significant benefit.   LFT abnormalities precluding the use of statin.   On examination, his blood pressure 126/78, pulse of 74 and irregular.  Lungs were clear.  Heart sounds were irregular without murmurs or  gallops.  Extremities were without edema.   Electrocardiogram dated today demonstrated sinus rhythm at 74 with  intervals - 0.09/0.42.  The axis was 75 degrees.   IMPRESSION:  1. Atrial fibrillation - permanent.  2. CHADS best score of 1 with a thromboembolic risk estimated to be      less than 1%, not on anticoagulation.  3. Dyslipidemia followed by Dr. Everardo All.   [name] is stable.  I will see him again in a year's time.     Duke Salvia, MD, [facility]  Electronically Signed    SCK/MedQ  DD: 09/16/2008  DT: 09/17/2008  Job #: [id number]

## 2011-01-16 NOTE — Op Note (Signed)
NAME:  Nicholas Bird, Nicholas Bird NO.:  [id number]   MEDICAL RECORD NO.:  [id number]          PATIENT TYPE:  AMB   LOCATION:  DAY                          FACILITY:  [facility]   PHYSICIAN:  Ardeth Sportsman, MD     DATE OF BIRTH:  10-20-1940   DATE OF PROCEDURE:  11/19/2008  DATE OF DISCHARGE:                               OPERATIVE REPORT   PRIMARY CARE PHYSICIAN:  Sean A. Everardo All, MD   GASTROENTEROLOGIST:  Dr. Sheryn Bison at [facility].   SURGEON:  Ardeth Sportsman, MD.   ASSISTANT:  None.   PREOPERATIVE DIAGNOSIS:  Left groin hernia femoral versus inguinal  recurrent, incarcerated with sigmoid colon.   OTHER DIAGNOSIS:  Possible right inguinal hernia.   POSTOPERATIVE DIAGNOSIS:  1. Left indirect inguinal incarcerated recurrent hernia (contained      sigmoid colon).  2. Right indirect inguinal hernia.   PROCEDURE PERFORMED:  Laparoscopic bilateral inguinal hernia repair with  mesh.   ANESTHESIA:  1. General anesthesia.  2. Local anesthetic with field block around port sites.   SPECIMENS:  None.   DRAINS:  None.   ESTIMATED BLOOD LOSS:  50 mL.   COMPLICATIONS:  None.   INDICATIONS FOR PROCEDURE:  Mr. Pardee is a 70 year old male with health  issues including hemosiderosis with some cirrhosis who was found to have  a recurrent left groin hernia based on a barium enema.  Request was made  for evaluation and we saw him that day in urgent clinic.  He was not  strangulated at the time.  Recommendation was made for laparoscopic  hernia repair.   Prior to that repair being done the patient had choledocholithiasis  requiring an ERCP and a cholecystectomy.  He had liver biopsies that  showed evidence of some cirrhosis and hemosiderosis followed by Dr.  Jarold Motto.  He recovered from that.   With him recovering from his biliary issues and hepatic issues stable  recommendation was made for reconsideration of laparoscopic inguinal  hernia repair.   Because of it was a recurrent hernia on the left side  and also there was probability of right inguinal hernia on examination  and a bilateral hernia recommendation was made for a laparoscopic  approach.  Risks, benefits, alternatives were discussed.  Questions  answered and he and his wife agreed to the procedure.   OPERATIVE FINDINGS:  He had recurrent left indirect inguinal hernia.  It  had sigmoid colon incarcerated within the sac itself.  There was no  evidence of any strangulation.  There was no evidence of any femoral or  direct or obturator defects.  On the right he had an indirect inguinal  hernia defect as well but no evidence of any direct femoral or obturator  defects.   DESCRIPTION OF PROCEDURE:  Informed consent was affirmed.  The patient  received IV cefazolin just prior to surgery.  He had sequential  compression devices active during the entire case.  He underwent general  anesthesia without any difficulty.  He had bilateral  ilioinguinal/genitofemoral/spermatic cord nerve blocks placed.   Entry was gained into  the abdominal wall through an infraumbilical  curvilinear incision.  A 12 mL Hasson port was placed in the retrorectal  plane without any difficulty.  Camera dissection was used to help free  the bilateral lower quadrants off the peritoneum off bilateral lower  quadrants.  Capnopreperitoneum 15 mmHg provided good abdominal  insufflation with enough working space such that 5 mm ports could be  placed in the right and left side.   Attention was turned towards the left side.  The peritoneum was freed  off the left flank.  The inferior epigastric vessels were actually  adherent to the peritoneum and some of it fell down but with sharp  dissection was able to get it re-elevated back up.  Hemostasis was good.  A Foley catheter had been sterilely placed preoperatively and his  bladder was nicely decompressed.  The anterolateral wall of the bladder  was freed off the  anteromedial pelvic wall.  A large swath of peritoneum  could be seen crawling up to the left inguinal region.  Careful blunt  and sharp dissection was used to help sweep the hernia sac off its  attachments to the anterior wall and the spermatic structures.  In doing  this the planes were difficult to see so I ended up opening the sac  anteriorly to get a better view.  Sigmoid colon was obviously up into  the inguinal region.  With some careful sharp dissection and freeing I  opened up the anterior wall of the sac and came around and carefully  bluntly freed off the hernia sac off the spermatic cord structures.  With some gentle pressure over time I was able to reduce the sigmoid  colon out of the inguinal canal and reduce it which made dissection much  easier.  Careful sharp and focused blunt dissection was used to free the  rest of the hernia sac off the cord structures.  The hernia sac and this  peritoneal hernia sac was peeled off and the cord structures more  proximally off the retroperitoneum.  The resulting defect was closed  using a 3-0 Vicryl intracorporeal suturing to good result.  Sigmoid  colon was viable and no evidence of any strangulation.   Attention was turned toward the right side.  Dissection was carried out  in a mirror image fashion.  There was a much smaller hernia sac involved  and maybe a small cord lipoma on the right side as well that was able to  be peeled back.  There was a perforator branch off the right inferior  epigastric vessels that had some oozing that was controlled with focused  cautery.  This was the source of most of the oozing and bleeding.  Copious irrigation was done over a liter and hemostasis was excellent.  A small tear had been made on the hernia sac at the base on the right  and that was closed using laparoscopic suture laparoscopic 3-0 Vicryl  stitch using intracorporeal suture as well to good result.   A 15 x 15 cm ultra lightweight  polypropylene (UltraPro) mesh was used  for each side.  Each piece of mesh was cut to a half skull shape such  that it was a 3 x 3 cm flap of mesh that rested down on the true pelvis  between the anteromedial lateral wall and the anterolateral bladder.  Mesh laid well posteriorly laterally superiorly medially such that there  was 3 inch of circumferential coverage around both internal rings.  Any  potential direct femoral obturator defects were covered up as well.  The  hernia sacs were grasped and cephalad as capnopreperitoneum was  evacuated.  Ports were removed.  The infraumbilical fascial defect was  closed using zero Vicryl stitch.  Skin was closed with 4-0  Monocryl stitch.  Sterile dressings applied.  The patient was extubated  and sent to the recovery room in stable condition.  I discussed postop  care with the patient in detail just prior to surgery as I had done  before twice in clinic.  I discussed followup with his wife as well.  Instruction sheets are written.      Ardeth Sportsman, MD  Electronically Signed     SCG/MEDQ  D:  11/19/2008  T:  11/19/2008  Job:  [id number]   cc:   Gregary Signs A. Everardo All, MD  [address]  [place]  [place] [address]   Vania Rea. Jarold Motto, MD, FACG, FACP, FAGA  [address]  [place]  [place] [address]   Gabrielle Dare. Janee Morn, M.D.  [facility]  [address] [place], [place] [address]

## 2011-01-16 NOTE — Op Note (Signed)
NAME:  Nicholas Bird, Nicholas Bird NO.:  [id number]   MEDICAL RECORD NO.:  [id number]          PATIENT TYPE:  OIB   LOCATION:  1531                         FACILITY:  [facility]   PHYSICIAN:  Ardeth Sportsman, MD     DATE OF BIRTH:  1941-08-16   DATE OF PROCEDURE:  10/21/2008  DATE OF DISCHARGE:                               OPERATIVE REPORT   PRIMARY CARE PHYSICIAN:  Dr. Romero Belling.   GASTROENTEROLOGIST:  Vania Rea. Jarold Motto, MD, Clementeen Graham, FACP, FAGA.   SURGEON:  Ardeth Sportsman, MD   ASSISTANT:  Ovidio Kin, M.D.   PREOPERATIVE DIAGNOSES:  1. Choledocholithiasis with jaundice status post endoscopic retrograde      cholangiopancreatography on October 12, 2008.  2. Chronic liver disease with prior biopsy of fatty steatohepatosis.  3. Recurrent left inguinal hernia, reducible.   POSTOPERATIVE DIAGNOSES:  1. Choledocholithiasis with jaundice status post endoscopic retrograde      cholangiopancreatography on October 12, 2008.  2. Chronic liver disease with prior biopsy of fatty steatohepatosis,      probable left greater than right hepatic cirrhosis.  3. Recurrent left inguinal hernia, reducible.   PROCEDURE PERFORMED:  1. Laparoscopic cholecystectomy with intraoperative cholangiogram.  2. Core liver biopsies x4 (left core x3, right x1).   ANESTHESIA:  1. General anesthesia.  2. Local anesthetic in a field block around port sites.   SPECIMENS:  1. Gallbladder.  2. Left liver lobe core biopsies x3.  3. Right liver lobe core biopsy x1.   DRAINS:  None.   ESTIMATED BLOOD LOSS:  Less than 30 mL.   COMPLICATIONS:  None apparent.   INDICATIONS:  Mr. Moffatt is a 70 year old gentleman with some known  chronic liver disease and moderate alcohol use with biopsy last year  consistent with fatty steatohepatosis.  He was seen by me initially for  a recurrent left inguinal hernia and I was setting him up for repair  when he developed jaundice.  He was admitted earlier this month  by  [facility] and underwent an ERCP after CT scans were  concerning for stones.  They evacuated some stones out of his common  bile duct.  Cholangiogram at that time looked underwhelming for any  intrahepatic or other issues.  He was discharged postprocedure day 1 and  followed up with me on postprocedure day 2.   The anatomy and physiology of hepatobiliary and pancreatic function was  discussed.  Pathophysiology of choledocholithiasis, status post ERCP  with chronic liver disease and other risks were discussed in detail.  Technique of laparoscopic cholecystectomy with intraoperative  cholangiogram was discussed.  Risks, benefits, alternatives discussed.  Questions answered and he agreed to proceed.   OPERATIVE FINDINGS:  His gallbladder had had some mild gallbladder wall  thickening but no major adhesions.  He did have stones in the neck of  his gallbladder.  His cholangiogram was mostly normal although I could  not get good pressure to fill up the left side of his hepatic system  just on his recent ERCP.  Prior ERCP cholangiogram had been normal.   His  liver had steatohepatosis and concerning for macronodular cirrhosis.  It seemed to be much more marked on the left side of the liver than the  right hepatic lobe; hence, the reason for core biopsies on both sides.   DESCRIPTION OF PROCEDURE:  Informed consent was confirmed.  The patient  voided just prior to going to the operating room.  He had sequential  compression devices active during the entire case.  He underwent general  anesthesia without any difficulty.  He was positioned supine with both  arms tucked.  His abdomen clipped, prepped and draped in sterile  fashion.  Surgical time out confirmed our plan.   A 5.0 port was placed the right upper quadrant with the patient in steep  reverse Trendelenburg and right-side up.  Camera inspection revealed no  intra-abdominal injury.  Under direct visualization, 5.0 ports  were  placed in right flank and through the umbilicus.  A 10 mm port was  tunneled through the falciform ligament of subxiphoid region.   Camera inspection revealed the findings noted above.  The gallbladder  fundus was grasped and elevated cephalad.  The peritoneal coverings  between the gallbladder and the liver were freed off on the anteromedial  and posterolateral walls of the gallbladder.  During that, there was a  small breach in the gallbladder with spill of clear bile.  This was  aspirated and controlled.  Dissection was done around the gallbladder to  free the proximal 90% of the gallbladder off the liver bed to get an  excellent critical view.  Dissecting there I did see a small branch  going to the posterior and then the mid part of posterior wall of the  gallbladder consistent with a posterior branch of the cystic artery.  One clip on gallbladder side, two slightly proximal were placed and this  was transected.  Another branch going onto the more proximal  anteromedial wall of the gallbladder was noted consistent with the  anterior of branch cystic artery.  This was isolated, skeletonized and  one clip on gallbladder side, two clips slightly proximal were made.  The lymphatics were seen, isolated and controlled as well and  skeletonizing the remaining structure going from the infundibulum down  to the porta hepatis to the cystic duct was able to clear about 3 cm  very easily.   Two clips were placed in the gallbladder infundibulum and a partial  cystic ductotomy was performed.  A 5-French cholangiocatheter was placed  through a right subcostal stab incision and into the cystic duct.   A cholangiogram was run using diluted radiopaque contrast and continuous  fluoroscopy.  Contrast rapidly filled the cystic duct into a mildly  dilated common hepatic and down the common bile duct of the duodenum  very rapidly consistent with a prior sphincterotomy.  There was no  filling  defects or other abnormalities.  It was hard to get enough  pressure to get contrast to reflux well up into the liver but I felt  pretty confident that I had the right major intrahepatic radicals filled  up well.  The left did not fill as well.  I did a second run and got a  little bit of better filling but it was hard to get a good pressure.  He  had had a normal intrahepatic cholangiogram on his ERCP, so I did not be  more aggressive.   Cholangiocatheter was removed.  Four clips were placed on the cystic  duct just proximal to the  cystic ductotomy and cystic transection was  completed.  The gallbladder was freed from the attachments on liver bed  and placed in an EndoCatch bag and removed out subxiphoid port without  any difficulty and no major dilation.   Copious irrigation was done and hemostasis was ensured.  I used over 4  liters irrigation.  I used a 14 gauge Tru-Cut core liver biopsy and did  four passes on the left hepatic lobe in segment four.  One of them was  poor but I got three decent cores on the left.  I went ahead and did a  core on the right, since there was some obvious differences between both  lobes and the right side looked much more normal.  I got a good core on  the right side, did not do any more cores.  Hemostasis was excellent on  those spots, controlled cautery.  Near the dome of the right liver.  He  did have some prior scarring consistent with his prior liver biopsy.  There was no major abnormality there.   Capnoperitoneum was evacuated.  Ports removed.  The subxiphoid fascial  defects closed using 0 Vicryl stitch.  Skin was closed 4-0 Monocryl  stitch.  Sterile dressings applied.  The patient was extubated and sent  to recovery room in stable condition.   I am about to explain the postoperative findings to the patient's wife  and care.  We will probably observe him at least overnight thank you.      Ardeth Sportsman, MD  Electronically Signed      SCG/MEDQ  D:  10/21/2008  T:  10/22/2008  Job:  [id number]   cc:   Vania Rea. Jarold Motto, MD, FACG, FACP, FAGA  [address]  [place]  [place] [address]   Gregary Signs A. Everardo All, MD  [address]  [place]  [place] [address]

## 2011-01-19 NOTE — Assessment & Plan Note (Signed)
[facility]                         GASTROENTEROLOGY OFFICE NOTE   NAME:Nicholas Bird                    MRN:          [id number]  DATE:11/06/2006                            DOB:          11/22/1940    Nicholas Bird comes in feeling fairly well to discuss his increased LFTs and  a  colonoscopy examination.  Patient has been doing fine.  He has been  under the care of Dr. Everardo All and Dr. Berton Mount for atrial  fibrillation.   MEDICATIONS:  Include metoprolol, allopurinol, baby aspirin, Zetia, and  WelChol.   His last visit with Dr. Graciela Husbands was July 2007.  At that time he had atrial  fibrillation with dyslipidemia that has been under the care of Dr.  Everardo All.  He switched to Lopressor as opposed to Toprol because there  were some issues of fatigue.  I last saw him in January 2005.  I last  saw him in January 2005, and we discussed his abnormal liver function  studies indicating that he did have a heterozygote for hemochromatosis,  which would only minimal implicate hemachromatosis.  His iron studies  have basically been within relatively reason of normal levels, although  maybe at the upper limits of normal.  He has continued to drink rum on a  daily basis over the years, that he admits to.  We talked about cutting  back on this for a time as well.  I talked to him about his having LFTs  continuing 2 to 3 times the normal level, which was worsening over this  length of time, and I that I thought that we should probably consider  doing more definitive studies to try to define the exact etiology of  these elevated transaminases.  I suggested an MR, repeat multiple liver  function studies again, and possibly a liver biopsy.  He was in  agreement with this if necessary.   PHYSICAL EXAMINATION:  He did have somewhat of a ruddy complexion,  possibly from alcohol.  His oropharynx was negative.  NECK:  Was negative.  He weighed 195.  Blood pressure 130/72.   Pulse 80 and regular.  CHEST:  Clear.  ABDOMEN:  Soft.  No masses or organomegaly.  EXTREMITIES:  Unremarkable.  RECTAL:  Deferred.   IMPRESSION:  1. Increased liver enzymes of questionable etiology.  Rule out      steatohepatitis or nonalcoholic steatohepatitis syndrome.      Hemochromatosis, although doubtful versus alcohol hepatitis.  2. History of atrial fibrillation.  3. Dyslipidemia.   RECOMMENDATIONS:  Get multiple liver function studies to see if there is  any esoteric etiology for these elevated enzymes, an MR in the near  future, and probably a liver biopsy.  I  have asked to cut back or cut out alcoholic beverages for the next 3  weeks, and repeat these liver function studies as well in approximately  3 weeks.     Ulyess Mort, MD  Electronically Signed    SML/MedQ  DD: 11/07/2006  DT: 11/07/2006  Job #: [id number]

## 2011-01-19 NOTE — Op Note (Signed)
[place]  Patient:    Nicholas Bird, Nicholas Bird                        MRN: [id number] Proc. Date: 01/14/01 Adm. Date:  [id number] Attending:  Kandis Mannan                           Operative Report  CCS NUMBER: [id number]  PREOPERATIVE DIAGNOSIS:  Left inguinal hernia.  POSTOPERATIVE DIAGNOSIS:  Left inguinal hernia.  OPERATION:  Left inguinal herniorrhaphy with mesh.  SURGEON:  Maisie Fus B. Samuella Cota, M.D.  ANESTHESIA:  Local (1% Xylocaine without epinephrine, 0.25% without epinephrine, and sodium bicarbonate) with anesthesia monitoring.  ANESTHESIOLOGIST:  NCRNA.  DESCRIPTION OF PROCEDURE:  The patient was taken to the operating room and placed on the table in supine position.  The left lower quadrant of the abdomen was prepped and draped in sterile field.  An oblique incision was outlined with the skin marker.  The local anesthetic was then used to block the ilioinguinal nerve above the incision and underlying tissue.  Local was then used throughout as necessary.  The oblique incision was made skin and subcutaneous tissue with subcutaneous bleeders being clamped and ligated with 3-0 Vicryl or cauterized with Bovie.  The external oblique aponeurosis was divided in line with its fibers to and through the external ring.  The ilioinguinal nerve was coming through the external oblique aponeurosis, and it was protected laterally.  Cord structure were isolated with Penrose drain. Dissection was revealed the recurrent epigastric vessels, and there was no evidence of a direct hernia.  The patient had a large amount of fatty tissue coming through the internal ring.  With further dissection, this was a lipoma. The peritoneum was identified at the internal ring and axially opened.  There was no indirect hernia sac.  This peritoneal defect was closed with a running suture of 3-0 Vicryl.  The fatty mass was dissected back to the internal ring, and the mass was  amputated with the base being ligated with suture ligatures of 3-0 Vicryl.  A 3 x 6 inch piece of a trimmed mesh was fashioned to cover the entire inguinal floor and extend around the cord structures superiorly and laterally.  The mesh was anchored inferiorly with a 2-0 Novofil with the first suture being placed in the pubic tubercle, the other sutures in the shelving edge of Pouparts ligament.  The mesh was anchored superiorly with interrupted sutures of 0 Novofil.  A slit was made into the mesh to accommodate the cord structure and internal ring.  The mesh was then sutured to itself with a 3-0 Vicryl suture.  There was sufficient room for the cord structure at the internal ring.  The external oblique aponeurosis was then reapproximated using interrupted sutures of 3-0 Vicryl with the ilioinguinal nerve coming through the external oblique aponeurosis.  This was then injected with local anesthetic.  Subcutaneous tissue irrigated.  Scarpas fascia closed with 3-0 Vicryl, and the skin was closed with a running subcuticular suture of 4-0 Vicryl.  Benzoin and 1/2 inch Steri-Strips were used to reinforce the skin closure.  Dry sterile dressings were applied.  The patient seemed to tolerate the procedure well and was taken to the PACU in satisfactory condition. DD:  01/14/01 TD:  01/14/01 Job: [id number]

## 2011-01-19 NOTE — Assessment & Plan Note (Signed)
[facility]                         GASTROENTEROLOGY OFFICE NOTE   NAME:Nicholas Bird, Nicholas Bird                        MRN:          [id number]  DATE:12/06/2006                            DOB:          [birth year]    Mr. Coller comes today for evaluation of 2 problems.   Problem number one:  He has had 1-1/2 weeks of a dull ache and  discomfort in his right upper quadrant area after undergoing  percutaneous liver biopsy on November 20, 2006.  He describes a dull pain  which is intermittent, is not associated with meals or bowel movements.  He has had no nausea, vomiting, fever, or chills.  He relates at the  time of his liver biopsy he did have some bleeding which required  Gelfoam administration which is confirmed by looking at his liver biopsy  report.  There was no postoperative imaging, evidence of bleeding, or  hematoma however.  The patient is scheduled for a colonoscopy with Dr.  Corinda Gubler the next few weeks.   He weighs 193 pounds and blood pressure is 134/80, pulse was 76 and  regular.  I cannot appreciate stigmata of chronic liver disease.  His chest is entirely clear to percussion and auscultation.  He is in a  normal rhythm without murmurs, gallops, or rubs.  I can not appreciate hepatosplenomegaly, abdominal masses, or  significant tenderness.  Bowel sounds were normal.  Peripheral  extremities were unremarkable.   ASSESSMENT:  I think Mr. Rockett most likely has a small capsulary hematoma  related to his lever biopsy.  He relates that he is starting to get  better at this time and I doubt we need further imaging or therapy at  this point.   RECOMMENDATIONS:  I have asked him to continue to avoid aspirin and  salicylates with p.r.n. Tylenol for analgesic.  If his problems worsen  we will proceed with imaging studies of the liver.   His second problem concerns his chronic liver function test  abnormalities with recent liver biopsy confirming moderate  steatosis but  no real fibrosis.  The patient readily admits that he is a heavy user of  alcohol and his liver function tests are mildly elevated with a total  bilirubin of 1.5 with an SGOT of 132, an SGPT of 123.  He has had an  otherwise negative hepatic workup by Dr. Corinda Gubler including hepatitis  serologies and usual metabolic causes of liver disease.  There has been  no evidence of hepatoma on CT scans of his liver.  Review of his liver  biopsy does show moderate increase in stainable iron.  Serum ferritin  level and iron studies however were not in the hemachromatosis range.   MEDICATIONS:  His only medications at this time are:  1. Metoprolol 50 mg twice a day.  2. Allopurinol 300 mg daily.  3. Zetia 10 mg daily.  4. He was on Welchol, which has been discontinued.   Watts and I had a long talk today about alcohol drinking problem and I  think he definitely has multiple evidence points confirming alcoholism  and  alcohol dependency.  He drinks excessively, has had early morning  drinking, and has had guilt about drinking but has had no real social  consequences or rock bottom experiences.  However, since his  retirement, he has had increase in alcohol dependency and abuse and  would like to really stop drinking at this time, especially in view of  his hepatic problems.  He says approximately 10 years ago he did quit  drinking for about 6 months, but this was difficult.  He had never gone  through DTs or had any mental status changes and he denies recent  blackouts.  His appetite is good and his weight is stable.  He denies  use of other nonprescription or prescription drugs.   FAMILY HISTORY:  Noncontributory.   He does have atrial fibrillation, is under the care of Dr. Graciela Husbands.  He  sees Dr. Everardo All for primary care.   ASSESSMENT:  Mr. Michael has alcohol dependency and probably associated  alcoholic liver disease and I have strongly advised him to undergo a 30-  day inpatient  alcohol rehab program at [facility] in [place], [place].  I  really do not think he would do well in an outpatient clinic, nor do I  think he would do well with a partial abstinence program.   RECOMMENDATIONS:  1. Mr. Hickle is to research [facility] further to let me know next      week and I will arrange for his admission.  2. Check CDT level to give [place] some concrete idea of the extent of his      alcohol use.  3. Continue monitorization of his liver function test and liver      imaging studies.  4. Dr. Corinda Gubler will need to make a decision about his coming up      colonoscopy before his retirement or to have this done with me at      some future time.     Vania Rea. Jarold Motto, MD, Caleen Essex, FAGA  Electronically Signed    DRP/MedQ  DD: 12/06/2006  DT: 12/06/2006  Job #: [id number]   cc:   Ulyess Mort, MD

## 2011-01-19 NOTE — Assessment & Plan Note (Signed)
[facility]                         GASTROENTEROLOGY OFFICE NOTE   NAME:Sylvan, DUSTYN DANSEREAU                        MRN:          [id number]  DATE:12/27/2006                            DOB:          [birth year]    Grace Isaac returns today for followup of his office visit when he saw me on  December 06, 2006.  His abdominal pain per his liver biopsy has pretty much  abated.  He continues to use alcohol but has been able to decrease his  dose considerably.  His CDT level was 43.2 which is markedly elevated  over normal at 2.6.  This is the highest level I have ever actually seen  with this exam which is indicative of chronic alcohol consumption.   Mr. Sanderson has contacted [facility] and [facility] and is really  not interested at this time in undergoing a 30 day program of  psychological evaluation.   ASSESSMENT:  Mr. Rothermel has chronic alcoholic liver disease and is a heavy  abuser of alcohol and has alcohol dependence.  He readily admits this  problem but does not feel that he is ready for inpatient therapy.   RECOMMENDATIONS:  1. Tapering doses of Valium in a program of 5 mg x5 daily, x4 daily,      x3 daily, etc., to try to get him off of alcohol so that he does      not have withdrawal difficulties.  2. Advise megavitamins with thiamine replacement.  3. After, hopefully, he can decrease his alcohol intake, we will be      able to place him on Campral 330 mg 2 tabs t.i.d.  I have given him      patient information concerning Campral and its anti-craving      properties and hopefully success in decreasing his alcohol intake.  4. We will give him counseling as to AA attendance when he returns to      clinic in 1 month's time for followup.  5. Continue other medications as per Dr. Everardo All.     Vania Rea. Jarold Motto, MD, Caleen Essex, FAGA  Electronically Signed    DRP/MedQ  DD: 12/27/2006  DT: 12/27/2006  Job #: [id number]   cc:   Gregary Signs A. Everardo All, MD  Ulyess Mort, MD

## 2011-01-19 NOTE — Assessment & Plan Note (Signed)
[facility]                           ELECTROPHYSIOLOGY OFFICE NOTE   NAME:Bird, Nicholas ROSENBERRY                        MRN:          [id number]  DATE:03/21/2006                            DOB:          [birth year]    [name] is seen.  He has permanent atrial fibrillation that is alone.  He  continues to do well.  He has no complaints of chest pain or shortness of  breath or any other exertional symptoms.   MEDICATIONS:  His medications include:  1.  Metoprolol 50 mg b.i.d.  2.  Allopurinol.  3.  Welchol added to Zetia for his hypercholesterolemia, which is primarily      a LDL defect recorded most recently at 161.   PHYSICAL EXAMINATION:  VITAL SIGNS:  On examination today, his blood  pressure was well-controlled at 124/80, pulse of 75 and irregular.  LUNGS:  Clear.  HEART:  Heart sounds were regular.  EXTREMITIES:  Without edema.   ACCESSORY CLINICAL DATA:  Electrocardiogram demonstrated atrial  fibrillation.   IMPRESSION:  1.  Atrial fibrillation.  2.  Dyslipidemia, under the care of Dr. Everardo All.   PLAN:  [name] is doing quite well and we will plan to see him again in 12  months' time and we will continue him on his current medications.   I have advised him to stay on his Lopressor as opposed to Toprol, as I think  it is much less likely to be associated with fatigue.                                   Duke Salvia, MD, [facility]   SCK/MedQ  DD:  03/21/2006  DT:  03/22/2006  Job #:  [id number]

## 2011-02-25 ENCOUNTER — Other Ambulatory Visit: Payer: Self-pay | Admitting: Endocrinology

## 2011-03-26 ENCOUNTER — Other Ambulatory Visit: Payer: Self-pay | Admitting: Dermatology

## 2011-05-04 ENCOUNTER — Telehealth: Payer: Self-pay | Admitting: *Deleted

## 2011-05-04 NOTE — Telephone Encounter (Signed)
Message copied by Leonette Monarch on Fri May 04, 2011 10:13 AM ------      Message from: Jarold Motto, DAVID R      Created: Fri May 04, 2011  9:17 AM       Please contact this patient. He needs an endoscopy and colonoscopy with propofol sedation. This was to be done in June of this year.

## 2011-05-04 NOTE — Telephone Encounter (Signed)
Pt states that he just had come cancers cut out of his face and needs sometime to recover and asked if I could call back in 2 weeks, I advised him i would.

## 2011-05-18 ENCOUNTER — Telehealth: Payer: Self-pay | Admitting: *Deleted

## 2011-05-18 NOTE — Telephone Encounter (Signed)
Spoke with wife she states that pt knows he needs procedure but it has taken longer than they thought to recover from his last surgery and he will call back to schedule.

## 2011-05-18 NOTE — Telephone Encounter (Signed)
Message copied by Leonette Monarch on Fri May 18, 2011  2:20 PM ------      Message from: Harlow Mares D      Created: Fri May 04, 2011 10:22 AM       Call pt about scheduling his ECL with [facility]

## 2011-07-29 ENCOUNTER — Other Ambulatory Visit: Payer: Self-pay | Admitting: Endocrinology

## 2011-08-17 ENCOUNTER — Other Ambulatory Visit: Payer: Self-pay | Admitting: Internal Medicine

## 2011-08-25 ENCOUNTER — Other Ambulatory Visit: Payer: Self-pay | Admitting: Endocrinology

## 2011-09-14 ENCOUNTER — Other Ambulatory Visit: Payer: Self-pay | Admitting: Internal Medicine

## 2011-09-20 ENCOUNTER — Telehealth: Payer: Self-pay | Admitting: *Deleted

## 2011-09-20 NOTE — Telephone Encounter (Signed)
I left a message with the patient's wife that I contacted his insurance company [facility] prior authorization - [phone]) and spoke with them about his Xarelto. Per Daisy with [facility], it is too soon to refill his medication now, but he does have PA through the end of the year.

## 2011-10-07 ENCOUNTER — Other Ambulatory Visit: Payer: Self-pay | Admitting: Internal Medicine

## 2011-10-10 ENCOUNTER — Other Ambulatory Visit: Payer: Self-pay | Admitting: *Deleted

## 2011-10-12 ENCOUNTER — Other Ambulatory Visit: Payer: Self-pay | Admitting: *Deleted

## 2011-10-12 MED ORDER — RIVAROXABAN 20 MG PO TABS: 20.0000 mg | ORAL_TABLET | Freq: Every day | ORAL | Status: DC

## 2011-10-12 NOTE — Telephone Encounter (Signed)
Patient requested again refill on Xarelto, done

## 2011-10-19 ENCOUNTER — Encounter: Payer: Self-pay | Admitting: Internal Medicine

## 2011-11-08 ENCOUNTER — Other Ambulatory Visit: Payer: Self-pay | Admitting: Internal Medicine

## 2011-12-11 ENCOUNTER — Other Ambulatory Visit: Payer: Self-pay | Admitting: Dermatology

## 2011-12-14 ENCOUNTER — Encounter: Payer: Self-pay | Admitting: Internal Medicine

## 2011-12-14 ENCOUNTER — Ambulatory Visit (INDEPENDENT_AMBULATORY_CARE_PROVIDER_SITE_OTHER): Payer: Medicare Other | Admitting: Internal Medicine

## 2011-12-14 VITALS — BP 135/78 | HR 63 | Ht 71.5 in | Wt 201.1 lb

## 2011-12-14 DIAGNOSIS — I4891 Unspecified atrial fibrillation: Secondary | ICD-10-CM

## 2011-12-14 DIAGNOSIS — I10 Essential (primary) hypertension: Secondary | ICD-10-CM

## 2011-12-14 NOTE — Patient Instructions (Signed)
Your physician wants you to follow-up in: 1 year with Dr. Klein. You will receive a reminder letter in the mail two months in advance. If you don't receive a letter, please call our office to schedule the follow-up appointment.  Your physician recommends that you continue on your current medications as directed. Please refer to the Current Medication list given to you today.  

## 2011-12-14 NOTE — Assessment & Plan Note (Signed)
stable °

## 2011-12-14 NOTE — Progress Notes (Signed)
  HPI  Nicholas Bird is a 71 y.o. male seen in followup for atrial fibrillation which is permanent. He had been doing very well until April 15 2009 at which time he developed transient aphasia . It was felt that this was a TIA. There is apparently some lack of consensus as to whether this was a thromboembolic episode or not.  We ultimately decided to put him on oral anticoagulation therapy. The Coumadin is quite a nuisance.He thus ended up on Pradaxa. GI symptoms however intervened and he was changed to  Rivaroxaban  He has had hematuria which turned out to be nothing     Past Medical History  Diagnosis Date  . Alcoholic cirrhosis of liver   . Personal history of colonic polyps 07/12/2008    TUBULAR ADENOMA  . CAD (coronary artery disease)   . Gout   . Atrial fibrillation   . Hyperlipemia   . Myocardial infarction   . Hypertension   . Alcohol dependence   . Nonspecific elevation of levels of transaminase or lactic acid dehydrogenase (LDH)   . TIA (transient ischemic attack)     Past Surgical History  Procedure Date  . Inguinal hernia repair 2002  . Cardiac catheterization 09/1998  . Tumor removal   . Cataract extraction 2008  . Cholecystectomy   . Hernia repair 2010    Current Outpatient Prescriptions  Medication Sig Dispense Refill  . allopurinol (ZYLOPRIM) 300 MG tablet take 1 tablet by mouth once daily  30 tablet  5  . calcium gluconate 500 MG tablet Take 500 mg by mouth daily.        . colestipol (COLESTID) 1 G tablet Take 1 g by mouth daily after breakfast. Take 2 hours away from all other medications.       Marland Kitchen lisinopril (PRINIVIL,ZESTRIL) 10 MG tablet take 1 tablet by mouth once daily  30 tablet  11  . metoprolol (LOPRESSOR) 50 MG tablet take 1 tablet by mouth twice a day  60 tablet  2  . Multiple Vitamin (MULTIVITAMIN) capsule Take 1 capsule by mouth daily.        . Rivaroxaban (XARELTO) 20 MG TABS Take 20 mg by mouth daily.  30 tablet  11  . saccharomyces boulardii  (FLORASTOR) 250 MG capsule Take 250 mg by mouth daily.        Marland Kitchen ZETIA 10 MG tablet take 1 tablet by mouth once daily - NO ADDITIONAL REFILLS UNTIL SEEN BY DR  30 tablet  0    No Known Allergies  Review of Systems negative except from HPI and PMH  Physical Exam BP 135/78  Pulse 63  Ht 5' 11.5" (1.816 m)  Wt 201 lb 1.9 oz (91.227 kg)  BMI 27.66 kg/m2 Well developed and well nourished in no acute distress HENT normal E scleral and icterus clear Neck Supple JVP flat; carotids brisk and full Clear to ausculation irregularly irregular   no murmurs gallops or rub Soft with active bowel sounds No clubbing cyanosis none Edema Alert and oriented, grossly normal motor and sensory function Skin Warm and Dry   [id number] -/09/[birth year]  Assessment and  Plan

## 2011-12-14 NOTE — Assessment & Plan Note (Signed)
Stable without sx  On  Rivaroxaban

## 2012-02-01 ENCOUNTER — Other Ambulatory Visit: Payer: Self-pay | Admitting: Internal Medicine

## 2012-02-28 ENCOUNTER — Other Ambulatory Visit: Payer: Self-pay | Admitting: Endocrinology

## 2012-03-17 ENCOUNTER — Other Ambulatory Visit: Payer: Self-pay | Admitting: Dermatology

## 2012-04-23 ENCOUNTER — Other Ambulatory Visit: Payer: Self-pay | Admitting: Internal Medicine

## 2012-06-07 ENCOUNTER — Other Ambulatory Visit: Payer: Self-pay | Admitting: Endocrinology

## 2012-06-09 NOTE — Telephone Encounter (Signed)
Pt needs CPE before refill. Called and LMOVM.

## 2012-07-09 ENCOUNTER — Encounter: Payer: Self-pay | Admitting: Endocrinology

## 2012-07-09 ENCOUNTER — Ambulatory Visit (INDEPENDENT_AMBULATORY_CARE_PROVIDER_SITE_OTHER): Payer: Medicare Other | Admitting: Endocrinology

## 2012-07-09 VITALS — BP 136/80 | HR 67 | Temp 97.8°F | Wt 202.0 lb

## 2012-07-09 DIAGNOSIS — K7030 Alcoholic cirrhosis of liver without ascites: Secondary | ICD-10-CM

## 2012-07-09 DIAGNOSIS — Z79899 Other long term (current) drug therapy: Secondary | ICD-10-CM

## 2012-07-09 DIAGNOSIS — E785 Hyperlipidemia, unspecified: Secondary | ICD-10-CM

## 2012-07-09 DIAGNOSIS — M109 Gout, unspecified: Secondary | ICD-10-CM

## 2012-07-09 DIAGNOSIS — I10 Essential (primary) hypertension: Secondary | ICD-10-CM

## 2012-07-09 DIAGNOSIS — I4891 Unspecified atrial fibrillation: Secondary | ICD-10-CM

## 2012-07-09 DIAGNOSIS — Z87898 Personal history of other specified conditions: Secondary | ICD-10-CM

## 2012-07-09 DIAGNOSIS — Z23 Encounter for immunization: Secondary | ICD-10-CM

## 2012-07-09 MED ORDER — PNEUMOCOCCAL VAC POLYVALENT 25 MCG/0.5ML IJ INJ: 0.5000 mL | INJECTION | INTRAMUSCULAR | Status: AC

## 2012-07-09 NOTE — Patient Instructions (Addendum)
please consider these measures for your health:  minimize alcohol.  do not use tobacco products.  have a colonoscopy at least every 10 years from age 71.  keep firearms safely stored.  always use seat belts.  have working smoke alarms in your home.  see an eye doctor and dentist regularly.  never drive under the influence of alcohol or drugs (including prescription drugs).  those with fair skin should take precautions against the sun.   please let me know what your wishes would be, if artificial life support measures should become necessary.  it is critically important to prevent falling down (keep floor areas well-lit, dry, and free of loose objects.  If you have a cane, walker, or wheelchair, you should use it, even for short trips around the house.  Also, try not to rush).   Please return in 1 year.   blood tests are being requested for you today.  We'll contact you with results.   (update: we discussed code status.  pt requests full code, but would not want to be started or maintained on artificial life-support measures if there was not a reasonable chance of recovery)

## 2012-07-09 NOTE — Progress Notes (Signed)
Subjective:    Patient ID: Nicholas Bird, male    DOB: 1941/08/31, 71 y.o.   MRN: [id number]  HPI here for regular wellness examination.  He's feeling pretty well in general, and says chronic med probs are stable, except as noted below.   Past Medical History  Diagnosis Date  . Alcoholic cirrhosis of liver   . Personal history of colonic polyps 07/12/2008    TUBULAR ADENOMA  . Gout   . Atrial fibrillation   . Hyperlipemia   . Hypertension   . Alcohol dependence   . Nonspecific elevation of levels of transaminase or lactic acid dehydrogenase (LDH)   . TIA (transient ischemic attack)     Past Surgical History  Procedure Date  . Inguinal hernia repair 2002  . Cardiac catheterization 09/1998  . Tumor removal   . Cataract extraction 2008  . Cholecystectomy   . Hernia repair 2010    History   Social History  . Marital Status: Married    Spouse Name: N/A    Number of Children: 2  . Years of Education: N/A   Occupational History  . retired   .     Social History Main Topics  . Smoking status: Never Smoker   . Smokeless tobacco: Never Used  . Alcohol Use: Yes     Comment: heavy daily use and alcoholism  . Drug Use: No  . Sexually Active: Not on file   Other Topics Concern  . Not on file   Social History Narrative  . No narrative on file    Current Outpatient Prescriptions on File Prior to Visit  Medication Sig Dispense Refill  . allopurinol (ZYLOPRIM) 300 MG tablet take 1 tablet by mouth once daily  30 tablet  0  . lisinopril (PRINIVIL,ZESTRIL) 10 MG tablet take 1 tablet by mouth once daily  30 tablet  11  . metoprolol (LOPRESSOR) 50 MG tablet take 1 tablet by mouth twice a day  60 tablet  9  . MINOCYCLINE HCL PO Take by mouth 2 (two) times daily.      . Multiple Vitamin (MULTIVITAMIN) capsule Take 1 capsule by mouth daily.        . Rivaroxaban (XARELTO) 20 MG TABS Take 20 mg by mouth daily.  30 tablet  11    No Known Allergies  Family History  Problem  Relation Age of Onset  . Lung cancer Mother   . Skin cancer Sister     BP 136/80  Pulse 67  Temp 97.8 F (36.6 C) (Oral)  Wt 202 lb (91.627 kg)  SpO2 97%   Review of Systems  Constitutional: Negative for fever and unexpected weight change.  HENT: Negative for ear discharge.   Eyes: Negative for visual disturbance.  Respiratory: Negative for shortness of breath.   Cardiovascular: Negative for chest pain.  Gastrointestinal: Negative for anal bleeding.  Genitourinary: Negative for hematuria.  Musculoskeletal: Negative for back pain.  Skin: Negative for rash.  Neurological: Negative for syncope, numbness and headaches.  Hematological: Bruises/bleeds easily.  Psychiatric/Behavioral: Negative for dysphoric mood.      Objective:   Physical Exam VS: see vs page GEN: no distress HEAD: head: no deformity eyes: no periorbital swelling, no proptosis external nose and ears are normal mouth: no lesion seen NECK: supple, thyroid is not enlarged CHEST WALL: no deformity LUNGS: clear to auscultation BREASTS:  No gynecomastia CV: reg rate and rhythm, no murmur ABD: abdomen is soft, nontender.  no hepatosplenomegaly.  not distended.  no hernia GENITALIA:  Normal male.   RECTAL/PROSTATE:  sees urology MUSCULOSKELETAL: muscle bulk and strength are grossly normal.  no obvious joint swelling.  gait is normal and steady EXTEMITIES: no deformity.  no ulcer on the feet.  feet are of normal color and temp.  no edema.  bilat varicosities.  There is bilateral onychomycosis. PULSES: dorsalis pedis intact bilat.  no carotid bruit NEURO:  cn 2-12 grossly intact.   readily moves all 4's.  sensation is intact to touch on the feet SKIN:  Normal texture and temperature.  No rash or suspicious lesion is visible.  Ecchymoses on forearms NODES:  None palpable at the neck PSYCH: alert, oriented x3.  Does not appear anxious nor depressed.    Assessment & Plan:  Wellness visit today, with problems stable,  except as noted.

## 2012-07-14 MED ORDER — PNEUMOCOCCAL VAC POLYVALENT 25 MCG/0.5ML IJ INJ: 0.5000 mL | INJECTION | INTRAMUSCULAR | Status: AC

## 2012-07-15 ENCOUNTER — Other Ambulatory Visit: Payer: Self-pay | Admitting: Dermatology

## 2012-07-28 ENCOUNTER — Other Ambulatory Visit: Payer: Self-pay | Admitting: *Deleted

## 2012-07-28 MED ORDER — ALLOPURINOL 300 MG PO TABS: 300.0000 mg | ORAL_TABLET | Freq: Every day | ORAL | Status: DC

## 2012-07-28 NOTE — Telephone Encounter (Signed)
Refill on allopurinol sent to [facility]

## 2012-09-08 ENCOUNTER — Other Ambulatory Visit: Payer: Self-pay | Admitting: Emergency Medicine

## 2012-09-08 MED ORDER — LISINOPRIL 10 MG PO TABS: 10.0000 mg | ORAL_TABLET | Freq: Every day | ORAL | Status: DC

## 2012-10-03 ENCOUNTER — Other Ambulatory Visit: Payer: Self-pay | Admitting: *Deleted

## 2012-10-03 MED ORDER — RIVAROXABAN 20 MG PO TABS: 20.0000 mg | ORAL_TABLET | Freq: Every day | ORAL | Status: DC

## 2012-10-19 ENCOUNTER — Telehealth: Payer: Self-pay | Admitting: Gastroenterology

## 2012-10-19 NOTE — Telephone Encounter (Signed)
I spoke with this patient.  He was recently admitted to [facility] with acute pancreatitis.  He apparently was hospitalized for 5 days and was discharged home.  He has had constipation and some shortness of breath since his discharge.  He denies nausea vomiting, fever, chills, or other systemic complaints.  Review of his record shows a long history of chronic ethanol abuse.  Previous CT scans of shown pancreatic atrophy.  He is however status post cholecystectomy and also endoscopic sphincterotomy several years ago for retained common bile duct stones.  The patient apparently has quit drinking since his hospitalization.  I've advised MiraLax 8 ounces every 4 hours as tolerated with office visit early next week, and of course complete absence from alcohol.  He does have some narcotics for pain  Use for his to avoid if at all possible because of his associated constipation.

## 2012-10-20 ENCOUNTER — Other Ambulatory Visit (INDEPENDENT_AMBULATORY_CARE_PROVIDER_SITE_OTHER): Payer: Medicare Other

## 2012-10-20 ENCOUNTER — Telehealth: Payer: Self-pay | Admitting: *Deleted

## 2012-10-20 ENCOUNTER — Telehealth: Payer: Self-pay | Admitting: Nurse Practitioner

## 2012-10-20 DIAGNOSIS — K7030 Alcoholic cirrhosis of liver without ascites: Secondary | ICD-10-CM

## 2012-10-20 LAB — CBC WITH DIFFERENTIAL/PLATELET
Basophils Relative: 0.4 % (ref 0.0–3.0)
Eosinophils Absolute: 0 10*3/uL (ref 0.0–0.7)
Eosinophils Relative: 0.1 % (ref 0.0–5.0)
Lymphocytes Relative: 7.4 % — ABNORMAL LOW (ref 12.0–46.0)
Neutrophils Relative %: 80.9 % — ABNORMAL HIGH (ref 43.0–77.0)
RBC: 3.56 Mil/uL — ABNORMAL LOW (ref 4.22–5.81)
WBC: 10 10*3/uL (ref 4.5–10.5)

## 2012-10-20 LAB — COMPREHENSIVE METABOLIC PANEL
Albumin: 3.9 g/dL (ref 3.5–5.2)
BUN: 15 mg/dL (ref 6–23)
Calcium: 9.2 mg/dL (ref 8.4–10.5)
Chloride: 97 mEq/L (ref 96–112)
Glucose, Bld: 144 mg/dL — ABNORMAL HIGH (ref 70–99)
Potassium: 3.8 mEq/L (ref 3.5–5.1)

## 2012-10-20 LAB — AMYLASE: Amylase: 84 U/L (ref 27–131)

## 2012-10-20 NOTE — Telephone Encounter (Signed)
Patient will be here tomorrow at 315 and here today for labs

## 2012-10-20 NOTE — Telephone Encounter (Signed)
Patient's wife Nicholas Bird called answering service over the weekend. She wanted to inform Dr. Jarold Motto that husband had been admitted to hospital in [facility] for pancreatitis. She wanted Dr. Jarold Motto to be aware of husband's hospitalization. Advised wife to make appointment with Dr. Jarold Motto after patient released from hospital. Recommended she obtain hospital records and bring with them to appointment.   Addendum:  I see that patient has already spoken to Dr. Jarold Motto this am.   Nicholas Cluster, NP-C

## 2012-10-21 ENCOUNTER — Ambulatory Visit (INDEPENDENT_AMBULATORY_CARE_PROVIDER_SITE_OTHER): Payer: Medicare Other | Admitting: Gastroenterology

## 2012-10-21 ENCOUNTER — Other Ambulatory Visit: Payer: Self-pay | Admitting: Gastroenterology

## 2012-10-21 ENCOUNTER — Encounter: Payer: Self-pay | Admitting: Gastroenterology

## 2012-10-21 VITALS — BP 134/76 | HR 64 | Ht 71.5 in | Wt 209.2 lb

## 2012-10-21 DIAGNOSIS — R17 Unspecified jaundice: Secondary | ICD-10-CM

## 2012-10-21 DIAGNOSIS — F1021 Alcohol dependence, in remission: Secondary | ICD-10-CM

## 2012-10-21 DIAGNOSIS — Z860100 Personal history of colon polyps, unspecified: Secondary | ICD-10-CM

## 2012-10-21 DIAGNOSIS — R141 Gas pain: Secondary | ICD-10-CM

## 2012-10-21 DIAGNOSIS — K9186 Retained cholelithiasis following cholecystectomy: Secondary | ICD-10-CM

## 2012-10-21 DIAGNOSIS — R140 Abdominal distension (gaseous): Secondary | ICD-10-CM

## 2012-10-21 DIAGNOSIS — K859 Acute pancreatitis without necrosis or infection, unspecified: Secondary | ICD-10-CM

## 2012-10-21 DIAGNOSIS — K7030 Alcoholic cirrhosis of liver without ascites: Secondary | ICD-10-CM

## 2012-10-21 MED ORDER — ZOLPIDEM TARTRATE 10 MG PO TABS: 10.0000 mg | ORAL_TABLET | Freq: Every evening | ORAL | Status: DC | PRN

## 2012-10-21 NOTE — Progress Notes (Signed)
This is a 72 year old Caucasian male with a long history of alcoholic liver disease which sbeen stable for some time.  He recently was on vacation and developed acute pancreatitis with abdominal pain and distention which required admission in [place].  He has associated nausea but has had no emesis, hematemesis, and is tolerating liquids and small amount of foods without difficulty. He was resuscitated with intravenous fluids, but did not have any evidence of hemorrhagic pancreatitis or multisystem failure..  He been off of alcohol now for one week's time, and his is only complaint is abdominal distention and constipation.  He's had no fever, chills, nausea vomiting, icterus, back pain, or cardiovascular issues except for some shortness of breath  at nighttime.  Treatment with MiraLax has allowed a small amount of bowel movements.  His had no melena, hematochezia, does have a history of recurrent colon polyps,is due for colonoscopy followup, and had a common bile duct stone in February of 2010 which required endoscopic sphincterotomy by Dr. Russella Dar.. we are trying to retrieve his most recent CT scan from [facility] emergency room last week.  CT scan at [facility] one year ago showed atrophy of the left lobe of the liver, pneumobilia, and diffuse pancreatic atrophy.  No evidence of a pancreatic pseudocyst.  Review of his labs shows mildly abnormal liver function test with a bilirubin of 2.6 mg percent and a mild chronic macrocytic anemia with a hemoglobin of 12.  Lipase had decreased from over 3099.  Has also had surgical repair of rather large left inguinal hernia several years ago.  Patient has a history of hypertension, and calcified coronary arteries, and continues to use ethanol heavily despite previous medical advice  He also has refused alcohol long-term rehabilitation services.  Current Medications, Allergies, Past Medical History, Past Surgical History, Family History and Social History were  reviewed in [employer] record.  ROS: All systems were reviewed and are negative unless otherwise stated in the HPI.... no chest pain, urinary, or systemic symptomatology.  He also denies abuse of NSAIDs or cigarettes.        No Known Allergies Outpatient Prescriptions Prior to Visit  Medication Sig Dispense Refill  . allopurinol (ZYLOPRIM) 300 MG tablet Take 1 tablet (300 mg total) by mouth daily.  30 tablet  6  . lisinopril (PRINIVIL,ZESTRIL) 10 MG tablet Take 1 tablet (10 mg total) by mouth daily.  30 tablet  5  . metoprolol (LOPRESSOR) 50 MG tablet take 1 tablet by mouth twice a day  60 tablet  9  . Multiple Vitamin (MULTIVITAMIN) capsule Take 1 capsule by mouth daily.        . Rivaroxaban (XARELTO) 20 MG TABS Take 1 tablet (20 mg total) by mouth daily.  30 tablet  6  . MINOCYCLINE HCL PO Take by mouth 2 (two) times daily.       No facility-administered medications prior to visit.   Past Medical History  Diagnosis Date  . Alcoholic cirrhosis of liver   . Personal history of colonic polyps 07/12/2008    TUBULAR ADENOMA  . Gout   . Atrial fibrillation   . Hyperlipemia   . Hypertension   . Alcohol dependence   . Nonspecific elevation of levels of transaminase or lactic acid dehydrogenase (LDH)   . TIA (transient ischemic attack)   . Pancreatitis    Past Surgical History  Procedure Laterality Date  . Inguinal hernia repair  2002  . Cardiac catheterization  09/1998  .  Tumor removal    . Cataract extraction  2008  . Cholecystectomy    . Hernia repair  2010   History   Social History  . Marital Status: Married    Spouse Name: N/A    Number of Children: 2  . Years of Education: N/A   Occupational History  . retired   .     Social History Main Topics  . Smoking status: Never Smoker   . Smokeless tobacco: Never Used  . Alcohol Use: Yes     Comment: heavy daily use and alcoholism  . Drug Use: No  . Sexually Active: None   Other Topics Concern   . None   Social History Narrative  . None   Family History  Problem Relation Age of Onset  . Lung cancer Mother   . Skin cancer Sister         Physical Exam: This patient has facial telangiectasias and mild icterus.  He is in no acute distress and is breathing normally.  Blood pressure 134/76, pulse 64 and regular, and weight 209 pounds with a BMI of 28.77.  Oxygen saturation 97%.  His chest is clear to percussion and auscultation.  Appears to be in a regular rhythm without murmurs gallops or rubs.  His abdomen is distended with an enlarged right hepatic lobe which is somewhat nodular but is nontender.  I cannot appreciate splenomegaly, definite ascites, and bowel sounds are normal.  Rectal exam is deferred.  Peripheral extremities showed no edema, phlebitis, or swollen joints.  Pulses are intact.  Mental status is normal.    Assessment and Plan: Probably resolving acute pancreatitis from ethanol abuse, rule out retained common bile duct stone status post cholecystectomy per his previous ERCP and ERS in February of 2010.  Other considerations would be development of a pancreatic pseudocyst.  His abdominal distention is probably from his ileus related to his recent pancreatitis, and I would advise continued MiraLax use with a small dose of milk of magnesia as needed.  I've scheduled MRCP hopefully as soon as possible.  I have given him Ambien 10 mg use at bedtime for sleep, and advise continued ethanol, and possible narcotic dependence.  He obviously has chronic alcoholic liver disease with Child's A cirrhosis.  He does not appear to have hemorrhagic pancreatitis with multisystem involvement, and I am surprised he is not having any current abdominal or back pain.  I've urged him to continue with the by mouth fluid resuscitation as tolerated.  As mentioned above, recent CT scan from his emergency room visit requested.  His continue his other medications with hypertensive cardiovascular disease as  listed and reviewed.  Please copy this note to his primary care physician. Encounter Diagnoses  Name Primary?  . Alcoholic cirrhosis of liver Yes  . Acute pancreatitis

## 2012-10-21 NOTE — Patient Instructions (Addendum)
You were scheduled for a MRCP at [facility] first floor radiology on 10-22-2012 at 8 am. Arrive at 7:45 am for registration. Nothing to eat or drink after midnight.  We have given you a printed prescription for : Ambien 10 mg, please take as needed at bedtime.  We will obtain medical records from [facility].

## 2012-10-22 ENCOUNTER — Other Ambulatory Visit: Payer: Self-pay | Admitting: Gastroenterology

## 2012-10-22 ENCOUNTER — Ambulatory Visit (HOSPITAL_COMMUNITY)
Admission: RE | Admit: 2012-10-22 | Discharge: 2012-10-22 | Disposition: A | Discharge status: Home / Self Care | Payer: Medicare Other | Source: Ambulatory Visit | Attending: Gastroenterology | Admitting: Gastroenterology

## 2012-10-22 ENCOUNTER — Telehealth: Payer: Self-pay | Admitting: *Deleted

## 2012-10-22 DIAGNOSIS — K7030 Alcoholic cirrhosis of liver without ascites: Secondary | ICD-10-CM

## 2012-10-22 DIAGNOSIS — K859 Acute pancreatitis without necrosis or infection, unspecified: Secondary | ICD-10-CM | POA: Insufficient documentation

## 2012-10-22 DIAGNOSIS — K7689 Other specified diseases of liver: Secondary | ICD-10-CM | POA: Insufficient documentation

## 2012-10-22 LAB — CARBOHYDRATE DEFICIENT TRANSFERRIN
%CDT: 2.4 % (ref <2.5)
CDT: 44.2 mg/L (ref 28.1–76.0)

## 2012-10-22 MED ORDER — TRAMADOL HCL 50 MG PO TABS: ORAL_TABLET | ORAL | Status: DC

## 2012-10-22 MED ORDER — CLONAZEPAM 1 MG PO TABS: 1.0000 mg | ORAL_TABLET | Freq: Two times a day (BID) | ORAL | Status: DC | PRN

## 2012-10-22 MED ORDER — GADOBENATE DIMEGLUMINE 529 MG/ML IV SOLN
19.0000 mL | Freq: Once | INTRAVENOUS | Status: AC | PRN
  Administered 2012-10-22: 19 mL via INTRAVENOUS

## 2012-10-22 NOTE — Telephone Encounter (Signed)
Informed pt of meds ordered by Dr Jarold Motto. In addition, he is to to take a mulltivite, continue with Miralax and when needed, MILK OF MAG. Pt stated understanding. He states he will forego the Urology referral for now.

## 2012-10-22 NOTE — Telephone Encounter (Signed)
Klonopin 1 mg twice a day , daily multivitamins, when necessary tramadol 50 mg every 6-8 hours as needed for pain.  Continue MiraLax and when necessary milk of magnesia.

## 2012-10-22 NOTE — Telephone Encounter (Signed)
Result Notes    Notes Recorded by Linna Hoff, RN on 10/22/2012 at 11:40 AM Informed pt of his results and he needs to avoid alcohol completely and he needs to f/u in 2 weeks and have labs before his visit. Reminder in to call him for labs; he will f/u on 11/10/12. Results sent to Dr Romero Belling. Dr Jarold Motto, pt reports he's had clear urine since 6 this am and he has seen Dr Retta Diones in the past. Do you still want a referral to Urology, pt would rather wait? Thanks. ------     Dr Jarold Motto, pt states you mentioned giving him something for anxiety and he refused. Today, he would like something. Please advise and also OK NOT to do the urology consult since his urine has been clear all day? Thanks.

## 2012-11-06 ENCOUNTER — Telehealth: Payer: Self-pay | Admitting: Gastroenterology

## 2012-11-06 ENCOUNTER — Other Ambulatory Visit (INDEPENDENT_AMBULATORY_CARE_PROVIDER_SITE_OTHER): Payer: Medicare Other

## 2012-11-06 DIAGNOSIS — Z8719 Personal history of other diseases of the digestive system: Secondary | ICD-10-CM

## 2012-11-06 LAB — CBC WITH DIFFERENTIAL/PLATELET
Basophils Absolute: 0 10*3/uL (ref 0.0–0.1)
HCT: 42.2 % (ref 39.0–52.0)
Lymphs Abs: 1.7 10*3/uL (ref 0.7–4.0)
MCV: 104.4 fl — ABNORMAL HIGH (ref 78.0–100.0)
Monocytes Absolute: 0.6 10*3/uL (ref 0.1–1.0)
Monocytes Relative: 9.8 % (ref 3.0–12.0)
Neutrophils Relative %: 61.7 % (ref 43.0–77.0)
Platelets: 374 10*3/uL (ref 150.0–400.0)
RDW: 14.9 % — ABNORMAL HIGH (ref 11.5–14.6)

## 2012-11-06 LAB — HEPATIC FUNCTION PANEL
Albumin: 4 g/dL (ref 3.5–5.2)
Total Protein: 7.3 g/dL (ref 6.0–8.3)

## 2012-11-06 LAB — AMYLASE: Amylase: 103 U/L (ref 27–131)

## 2012-11-06 NOTE — Telephone Encounter (Signed)
Pt called to ask when he could have his labs done; f/u is 11/10/12. Labs entered and pt will come today or tomorrow.

## 2012-11-10 ENCOUNTER — Encounter: Payer: Self-pay | Admitting: Gastroenterology

## 2012-11-10 ENCOUNTER — Ambulatory Visit (INDEPENDENT_AMBULATORY_CARE_PROVIDER_SITE_OTHER): Payer: Medicare Other | Admitting: Gastroenterology

## 2012-11-10 VITALS — BP 98/60 | HR 63 | Ht 70.47 in | Wt 190.0 lb

## 2012-11-10 DIAGNOSIS — Z9089 Acquired absence of other organs: Secondary | ICD-10-CM

## 2012-11-10 DIAGNOSIS — K7460 Unspecified cirrhosis of liver: Secondary | ICD-10-CM

## 2012-11-10 DIAGNOSIS — F1010 Alcohol abuse, uncomplicated: Secondary | ICD-10-CM

## 2012-11-10 DIAGNOSIS — Z86010 Personal history of colonic polyps: Secondary | ICD-10-CM

## 2012-11-10 DIAGNOSIS — K859 Acute pancreatitis without necrosis or infection, unspecified: Secondary | ICD-10-CM

## 2012-11-10 DIAGNOSIS — Z9049 Acquired absence of other specified parts of digestive tract: Secondary | ICD-10-CM

## 2012-11-10 DIAGNOSIS — K5900 Constipation, unspecified: Secondary | ICD-10-CM

## 2012-11-10 NOTE — Patient Instructions (Signed)
Please make a follow up visit with Dr. Jarold Motto in two months.

## 2012-11-10 NOTE — Progress Notes (Signed)
This is a 72 year old Caucasian male chronic alcoholic who recently was admitted to a hospital in [place] for acute pancreatitis.  Subsequent CT scan locally showed evidence of resolving acute pancreatitis without any evidence of hemorrhagic pancreatitis, significant pseudocyst several small 4 mm inflammatory cyst in the tail the pancreas of little significance, but there were changes consistent with chronic hepatic cirrhosis.  No biliary ductal dilatation or evidence of ascites was noted. The patient been off alcohol now for 3 weeks, and denies any general medical or gastrointestinal symptoms.  He has used  when necessary Klonopin and Ambien for sleep.  Review of his CT scan shows no evidence of biliary ductal dilatation, and he has had previous ERCP and endoscopic sphincterotomy in February of 2010 per Dr. Russella Dar.  He is status post cholecystectomy for cholelithiasis, also has had repair of a ventral wall hernia..  Since he was last seen the patient lost 20 pounds in weight, and his constipation has been relieved by daily MiraLax.  He denies a current neuropsychiatric difficulties.  His wife is present throughout the interview and exam.  Current Medications, Allergies, Past Medical History, Past Surgical History, Family History and Social History were reviewed in [employer] record.  ROS: All systems were reviewed and are negative unless otherwise stated in the HPI.          Physical Exam: Blood pressure 90/60, pulse 63 and regular, weight 190 with a BMI of 26.9.  98% oxygen saturation.  His chest is clear he is in a regular rhythm but no significant murmurs gallops or rubs.  His abdomen shows a markedly diminished liver size but he still has mild hepatomegaly, but no masses or tenderness.  I cannot appreciate splenomegaly, ascites, abdominal distention, other masses or tenderness.  Bowel sounds are normal.  There is no peripheral edema, phlebitis, or swollen joints.  Mental  status is normal and there is no asterixis.    Assessment and Plan: Resolving acute pancreatitis related to relapsing alcohol abuse.  Patient has known chronic underlying alcoholic liver disease with Child's A cirrhosis.  He is much improved in terms of his hepatomegaly and GI symptomatology off of alcohol.  His previous abnormal serum transaminase levels have normalized.  I have asked him to contact [facility] for outpatient counseling and alcohol rehabilitation support.  He apparently is familiar this institution per his sister who had alcoholism,and was treated at this institution..  I have reviewed in detail the risk of continued alcohol use with this patient and with his wife.  I am dubious that he will follow my advice concerning alcohol use.  Again, I strongly suggested alcohol counseling and support as an outpatient.  He is to continue his other medications as listed and reviewed his record, he has regular primary care visit with Dr. Barnetta Chapel .  I see no need for repeat CT scan at this time.  He continues to do well he'll see me again in 2-3 months or when necessary as needed.  I've advised when necessary MiraLax use for constipation and high fiber foods by mouth as tolerated.  Lab review shows a normal CDT level at the time of his last office visit.  Amylase and lipase have returned to normal levels.  I spent a  large amount of time reviewing his clinical course, labs, radiographs, and prognosis with  the patient and his wife.

## 2012-11-20 ENCOUNTER — Encounter: Payer: Self-pay | Admitting: Internal Medicine

## 2012-11-20 ENCOUNTER — Ambulatory Visit (INDEPENDENT_AMBULATORY_CARE_PROVIDER_SITE_OTHER): Payer: Medicare Other | Admitting: Internal Medicine

## 2012-11-20 VITALS — BP 131/84 | HR 67 | Ht 71.0 in | Wt 192.2 lb

## 2012-11-20 DIAGNOSIS — N529 Male erectile dysfunction, unspecified: Secondary | ICD-10-CM

## 2012-11-20 DIAGNOSIS — R42 Dizziness and giddiness: Secondary | ICD-10-CM | POA: Insufficient documentation

## 2012-11-20 DIAGNOSIS — R040 Epistaxis: Secondary | ICD-10-CM

## 2012-11-20 NOTE — Assessment & Plan Note (Signed)
He is modest bradycardia with his beta blockers. He's having some lightheadedness. We will make once which at time but we anticipate decreasing his metoprolol from 50-25 twice a day

## 2012-11-20 NOTE — Assessment & Plan Note (Signed)
Well controlled on lisinopril. However, he is noted erectile dysfunction since its initiation. We withdrawal lisinopril and see how he does. Let us know about 10 days.

## 2012-11-20 NOTE — Patient Instructions (Signed)
Your physician wants you to follow-up in: 1 year, You will receive a reminder letter in the mail two months in advance. If you don't receive a letter, please call our office to schedule the follow-up appointment.  Your physician recommends that you continue on your current medications as directed. Please refer to the Current Medication list given to you today.  You have been referred to [facility], Dr. Pollyann Kennedy or Dr. Lazarus Salines

## 2012-11-20 NOTE — Assessment & Plan Note (Signed)
As above.

## 2012-11-20 NOTE — Progress Notes (Signed)
  HPI  Nicholas Bird is a 72 y.o. male seen in followup for atrial fibrillation which is permanent. He had been doing very well until April 15 2009 at which time he developed transient aphasia . It was felt that this was a TIA. There is apparently some lack of consensus as to whether this was a thromboembolic episode or not.  We ultimately decided to put him on oral anticoagulation therapy. The Coumadin is quite a nuisance.He thus ended up on Pradaxa. GI symptoms however intervened and he was changed to  Rivaroxaban   He is in no significant functional complaints. \he was recently hospitalized for pancreatitis. He is refraining from alcohol since then  He is also noted epistaxis from his right nares. This happens a couple of times a week.  He has also noted Ed sincxe the introduction of lisinopril  He has some lightheadedness     Past Medical History  Diagnosis Date  . Alcoholic cirrhosis of liver   . Personal history of colonic polyps 07/12/2008    TUBULAR ADENOMA  . Gout   . Atrial fibrillation   . Hyperlipemia   . Hypertension   . Alcohol dependence   . Nonspecific elevation of levels of transaminase or lactic acid dehydrogenase (LDH)   . TIA (transient ischemic attack)   . Pancreatitis     Past Surgical History  Procedure Laterality Date  . Inguinal hernia repair  2002  . Cardiac catheterization  09/1998  . Tumor removal    . Cataract extraction  2008  . Cholecystectomy    . Hernia repair  2010    Current Outpatient Prescriptions  Medication Sig Dispense Refill  . allopurinol (ZYLOPRIM) 300 MG tablet Take 1 tablet (300 mg total) by mouth daily.  30 tablet  6  . clonazePAM (KLONOPIN) 1 MG tablet Take 0.5 mg by mouth at bedtime.      Marland Kitchen lisinopril (PRINIVIL,ZESTRIL) 10 MG tablet Take 1 tablet (10 mg total) by mouth daily.  30 tablet  5  . metoprolol (LOPRESSOR) 50 MG tablet take 1 tablet by mouth twice a day  60 tablet  9  . minocycline (MINOCIN,DYNACIN) 50 MG capsule  Take 50 mg by mouth 2 (two) times daily.      . Multiple Vitamin (MULTIVITAMIN) capsule Take 1 capsule by mouth daily.        . Rivaroxaban (XARELTO) 20 MG TABS Take 1 tablet (20 mg total) by mouth daily.  30 tablet  6  . traMADol (ULTRAM) 50 MG tablet Take one tablet by mouth every 6-8 hours as needed for pain  30 tablet  1  . zolpidem (AMBIEN) 10 MG tablet Take 1 tablet (10 mg total) by mouth at bedtime as needed for sleep.  30 tablet  0   No current facility-administered medications for this visit.    No Known Allergies  Review of Systems negative except from HPI and PMH  Physical Exam BP 131/84  Pulse 67  Ht 5\' 11"  (1.803 m)  Wt 192 lb 3.2 oz (87.181 kg)  BMI 26.82 kg/m2 Well developed and well nourished in no acute distress HENT normal E scleral and icterus clear Neck Supple JVP flat; carotids brisk and full Clear to ausculation irregularly irregular   no murmurs gallops or rub Soft with active bowel sounds No clubbing cyanosis none Edema Alert and oriented, grossly normal motor and sensory function Skin Warm and Dry   afib 56 -/09/43  Assessment and  Plan

## 2012-11-20 NOTE — Assessment & Plan Note (Signed)
Permanent with a slow ventricular response. Prior TIA and is on a NOAC. He is having some bleeding from his right nose. We'll have him see ENT to make sure there is nothing specific that's not treatable. We have discussed trying to use apixaban as an alternative if there is nothing found

## 2013-01-13 ENCOUNTER — Ambulatory Visit (INDEPENDENT_AMBULATORY_CARE_PROVIDER_SITE_OTHER): Payer: Medicare Other | Admitting: Gastroenterology

## 2013-01-13 ENCOUNTER — Encounter: Payer: Self-pay | Admitting: Gastroenterology

## 2013-01-13 VITALS — BP 110/60 | HR 59 | Ht 71.0 in | Wt 193.4 lb

## 2013-01-13 DIAGNOSIS — K709 Alcoholic liver disease, unspecified: Secondary | ICD-10-CM

## 2013-01-13 DIAGNOSIS — R350 Frequency of micturition: Secondary | ICD-10-CM

## 2013-01-13 DIAGNOSIS — L719 Rosacea, unspecified: Secondary | ICD-10-CM

## 2013-01-13 DIAGNOSIS — Z86010 Personal history of colonic polyps: Secondary | ICD-10-CM

## 2013-01-13 DIAGNOSIS — Z9049 Acquired absence of other specified parts of digestive tract: Secondary | ICD-10-CM

## 2013-01-13 DIAGNOSIS — Z9889 Other specified postprocedural states: Secondary | ICD-10-CM

## 2013-01-13 DIAGNOSIS — K859 Acute pancreatitis without necrosis or infection, unspecified: Secondary | ICD-10-CM

## 2013-01-13 NOTE — Patient Instructions (Signed)
Please follow up in three months with Dr. Jarold Motto

## 2013-01-13 NOTE — Progress Notes (Signed)
This is a very pleasant 72 year old Caucasian male with chronic alcohol-induced liver disease, currently recovering from acute pancreatitis several months ago which resolved with medical therapy.  He has successfully discontinued alcohol use of the last 3 months, and denies a current GI or general medical problems except for occasional subxiphoid discomfort without true reflux symptoms, and no history of dysphagia.  Main complaint is nocturnal urination, and apparently he has been followed by Dr. Mena Goes and urology.  He has atrial fibrillation is followed by Dr. Sherryl Manges in cardiology, currently on Lopressor 50 mg twice a day Xarelto 20 mg daily.  There no current cardiovascular pulmonary complaints.  His bowel movements are regular without melena, hematochezia, or oily stools.Nicholas Bird  He is not on sedatives or sleep aids at this time.  Currently the patient is on Minocin 100 mg twice a day for rosacea of his face.  Current Medications, Allergies, Past Medical History, Past Surgical History, Family History and Social History were reviewed in [employer] record.  ROS: All systems were reviewed and are negative unless otherwise stated in the HPI.          Physical Exam: Obvious rosacea of the face.  Otherwise I cannot appreciate stigmata of chronic liver disease.  Blood pressure 110/60 pulse 59 and irregular and weight 193 with a BMI of 26.98.  His chest is clear his cardiac rhythm is well controlled he does appear to have an irregular irregular rate.  His liver size has decreased but I can still feel a somewhat lobular edge in the right upper quadrant with deep inspiration.  There is no splenomegaly, ascites, other abdominal masses or tenderness.  Bowel sounds are normal.  There is no peripheral edema or phlebitis.  Mental status is normal and there is no asterixis.    Assessment and Plan: Prior evidence of Child's A alcoholic liver disease without evidence of portal  hypertension.  His acute ethanol-induced pancreatitis seems to have resolved without sequelae.  I expressed my approval and platitudes per his alcohol cessation, and I have urged him to continue whatever means possible to avoid alcohol in the future because of his associated pancreatic and liver problems.  He is to continue to see me every 3 months or when necessary as needed.  Will forward copy of my note to his urologist and cardiologist for review.  I did not order followup labs today followup CT scan.  In the future he will need followup colonoscopy per his previous colon polyps, but not at this time.  He relates he urinates every 2 hours of bedtime, and perhaps Flomax would be in order.  And Dr. Sherryl Manges in cardiology and Dr. Mena Goes in urology No diagnosis found.

## 2013-01-14 ENCOUNTER — Telehealth: Payer: Self-pay | Admitting: *Deleted

## 2013-01-14 MED ORDER — METOPROLOL TARTRATE 25 MG PO TABS: 25.0000 mg | ORAL_TABLET | Freq: Two times a day (BID) | ORAL | Status: DC

## 2013-01-14 NOTE — Telephone Encounter (Signed)
Spoke with patient and let him know Dr Graciela Husbands reviewed his note.  He can decrease his Metoprolol to 25mg  bid  Pt agrees

## 2013-02-06 ENCOUNTER — Other Ambulatory Visit: Payer: Self-pay

## 2013-02-06 MED ORDER — ALLOPURINOL 300 MG PO TABS: 300.0000 mg | ORAL_TABLET | Freq: Every day | ORAL | Status: DC

## 2013-02-09 ENCOUNTER — Other Ambulatory Visit: Payer: Self-pay | Admitting: *Deleted

## 2013-02-09 MED ORDER — ALLOPURINOL 300 MG PO TABS: 300.0000 mg | ORAL_TABLET | Freq: Every day | ORAL | Status: DC

## 2013-04-24 ENCOUNTER — Encounter: Payer: Self-pay | Admitting: Gastroenterology

## 2013-04-28 ENCOUNTER — Other Ambulatory Visit: Payer: Self-pay | Admitting: *Deleted

## 2013-04-28 MED ORDER — RIVAROXABAN 20 MG PO TABS: 20.0000 mg | ORAL_TABLET | Freq: Every day | ORAL | Status: DC

## 2013-06-01 ENCOUNTER — Other Ambulatory Visit: Payer: Self-pay | Admitting: Dermatology

## 2013-06-02 ENCOUNTER — Other Ambulatory Visit: Payer: Self-pay

## 2013-06-02 MED ORDER — METOPROLOL TARTRATE 25 MG PO TABS: 25.0000 mg | ORAL_TABLET | Freq: Two times a day (BID) | ORAL | Status: DC

## 2013-06-10 ENCOUNTER — Other Ambulatory Visit: Payer: Self-pay

## 2013-06-10 MED ORDER — METOPROLOL TARTRATE 25 MG PO TABS: 25.0000 mg | ORAL_TABLET | Freq: Two times a day (BID) | ORAL | Status: DC

## 2013-07-22 ENCOUNTER — Ambulatory Visit (INDEPENDENT_AMBULATORY_CARE_PROVIDER_SITE_OTHER): Payer: Medicare Other | Admitting: Endocrinology

## 2013-07-22 ENCOUNTER — Encounter: Payer: Self-pay | Admitting: Endocrinology

## 2013-07-22 VITALS — BP 136/78 | HR 56 | Temp 97.6°F | Resp 16 | Ht 71.0 in | Wt 192.8 lb

## 2013-07-22 DIAGNOSIS — R319 Hematuria, unspecified: Secondary | ICD-10-CM

## 2013-07-22 DIAGNOSIS — E785 Hyperlipidemia, unspecified: Secondary | ICD-10-CM

## 2013-07-22 DIAGNOSIS — R209 Unspecified disturbances of skin sensation: Secondary | ICD-10-CM

## 2013-07-22 DIAGNOSIS — Z79899 Other long term (current) drug therapy: Secondary | ICD-10-CM

## 2013-07-22 DIAGNOSIS — G609 Hereditary and idiopathic neuropathy, unspecified: Secondary | ICD-10-CM

## 2013-07-22 DIAGNOSIS — M109 Gout, unspecified: Secondary | ICD-10-CM

## 2013-07-22 DIAGNOSIS — I4891 Unspecified atrial fibrillation: Secondary | ICD-10-CM

## 2013-07-22 DIAGNOSIS — Z125 Encounter for screening for malignant neoplasm of prostate: Secondary | ICD-10-CM | POA: Insufficient documentation

## 2013-07-22 DIAGNOSIS — R200 Anesthesia of skin: Secondary | ICD-10-CM | POA: Insufficient documentation

## 2013-07-22 DIAGNOSIS — I10 Essential (primary) hypertension: Secondary | ICD-10-CM

## 2013-07-22 LAB — LDL CHOLESTEROL, DIRECT: Direct LDL: 145.2 mg/dL

## 2013-07-22 LAB — HEPATIC FUNCTION PANEL
ALT: 19 U/L (ref 0–53)
AST: 21 U/L (ref 0–37)
Albumin: 4.5 g/dL (ref 3.5–5.2)
Total Bilirubin: 1.3 mg/dL — ABNORMAL HIGH (ref 0.3–1.2)

## 2013-07-22 LAB — TSH: TSH: 1.63 u[IU]/mL (ref 0.35–5.50)

## 2013-07-22 LAB — BASIC METABOLIC PANEL WITH GFR
BUN: 16 mg/dL (ref 6–23)
CO2: 28 meq/L (ref 19–32)
Calcium: 10 mg/dL (ref 8.4–10.5)
Chloride: 99 meq/L (ref 96–112)
Creatinine, Ser: 0.9 mg/dL (ref 0.4–1.5)
GFR: 92.98 mL/min (ref >60.00)
Glucose, Bld: 83 mg/dL (ref 70–99)
Potassium: 4.4 meq/L (ref 3.5–5.1)
Sodium: 134 meq/L — ABNORMAL LOW (ref 135–145)

## 2013-07-22 LAB — CBC WITH DIFFERENTIAL/PLATELET
Basophils Absolute: 0 10*3/uL (ref 0.0–0.1)
Eosinophils Relative: 1.8 % (ref 0.0–5.0)
HCT: 42 % (ref 39.0–52.0)
Hemoglobin: 14.1 g/dL (ref 13.0–17.0)
Lymphs Abs: 1.3 10*3/uL (ref 0.7–4.0)
MCHC: 33.5 g/dL (ref 30.0–36.0)
MCV: 100.6 fl — ABNORMAL HIGH (ref 78.0–100.0)
Monocytes Absolute: 0.7 10*3/uL (ref 0.1–1.0)
Neutro Abs: 4.1 10*3/uL (ref 1.4–7.7)
Neutrophils Relative %: 66.3 % (ref 43.0–77.0)
Platelets: 194 10*3/uL (ref 150.0–400.0)
WBC: 6.2 10*3/uL (ref 4.5–10.5)

## 2013-07-22 LAB — URINALYSIS, ROUTINE W REFLEX MICROSCOPIC
Leukocytes, UA: NEGATIVE
Nitrite: NEGATIVE
Urobilinogen, UA: 0.2 (ref 0.0–1.0)

## 2013-07-22 LAB — URIC ACID: Uric Acid, Serum: 4.6 mg/dL (ref 4.0–7.8)

## 2013-07-22 LAB — PSA: PSA: 1.31 ng/mL (ref 0.10–4.00)

## 2013-07-22 LAB — LIPID PANEL
Cholesterol: 211 mg/dL — ABNORMAL HIGH (ref 0–200)
HDL: 54.6 mg/dL (ref >39.00)
Total CHOL/HDL Ratio: 4
Triglycerides: 84 mg/dL (ref 0.0–149.0)
VLDL: 16.8 mg/dL (ref 0.0–40.0)

## 2013-07-22 LAB — EKG 12-LEAD

## 2013-07-22 NOTE — Patient Instructions (Signed)
Please call and schedule your colonoscopy. please consider these measures for your health:  Avoid alcohol altigether.  do not use tobacco products.  have a colonoscopy at least every 10 years from age 72. keep firearms safely stored.  always use seat belts.  have working smoke alarms in your home.  see an eye doctor and dentist regularly.  never drive under the influence of alcohol or drugs (including prescription drugs).  those with fair skin should take precautions against the sun. please let me know what your wishes would be, if artificial life support measures should become necessary.  it is critically important to prevent falling down (keep floor areas well-lit, dry, and free of loose objects.  If you have a cane, walker, or wheelchair, you should use it, even for short trips around the house.  Also, try not to rush). blood tests are being requested for you today.  We'll contact you with results. You should have a vaccine against shingles (a painful rash which results from the  chickenpox infection which most people had many years ago).  This vaccine reduces, but does not totally eliminate the risk of shingles.  Because this is a medicare part d benefit, you should get it at a pharmacy.    Please return in 1 year.

## 2013-07-22 NOTE — Progress Notes (Signed)
Subjective:    Patient ID: Nicholas Bird, male    DOB: 26-Oct-1940, 72 y.o.   MRN: [id number]  HPI Pt is here for regular wellness examination, and is feeling pretty well in general, and says chronic med probs are stable, except as noted below.  He has reduce etoh intake, but has not stopped.   Past Medical History  Diagnosis Date  . Alcoholic cirrhosis of liver   . Personal history of colonic polyps 07/12/2008    TUBULAR ADENOMA  . Gout   . Atrial fibrillation   . Hyperlipemia   . Hypertension   . Alcohol dependence   . Nonspecific elevation of levels of transaminase or lactic acid dehydrogenase (LDH)   . TIA (transient ischemic attack)   . Pancreatitis     Past Surgical History  Procedure Laterality Date  . Inguinal hernia repair  2002  . Cardiac catheterization  09/1998  . Tumor removal    . Cataract extraction  2008  . Cholecystectomy    . Hernia repair  2010    History   Social History  . Marital Status: Married    Spouse Name: N/A    Number of Children: 2  . Years of Education: N/A   Occupational History  . retired   .     Social History Main Topics  . Smoking status: Never Smoker   . Smokeless tobacco: Never Used  . Alcohol Use: Yes     Comment: heavy daily use and alcoholism. hadn't drank since Oct 15 2012  . Drug Use: Yes    Special: Other-see comments  . Sexual Activity: Not on file   Other Topics Concern  . Not on file   Social History Narrative  . No narrative on file    Current Outpatient Prescriptions on File Prior to Visit  Medication Sig Dispense Refill  . allopurinol (ZYLOPRIM) 300 MG tablet Take 1 tablet (300 mg total) by mouth daily.  30 tablet  2  . metoprolol tartrate (LOPRESSOR) 25 MG tablet Take 1 tablet (25 mg total) by mouth 2 (two) times daily.  60 tablet  9  . minocycline (MINOCIN,DYNACIN) 50 MG capsule Take 100 mg by mouth 2 (two) times daily.       . Multiple Vitamin (MULTIVITAMIN) capsule Take 1 capsule by mouth daily.         . Rivaroxaban (XARELTO) 20 MG TABS tablet Take 1 tablet (20 mg total) by mouth daily.  30 tablet  6   No current facility-administered medications on file prior to visit.   No Known Allergies  Family History  Problem Relation Age of Onset  . Lung cancer Mother   . Skin cancer Sister    BP 136/78  Pulse 56  Temp(Src) 97.6 F (36.4 C) (Oral)  Resp 16  Ht 5\' 11"  (1.803 m)  Wt 192 lb 12.8 oz (87.454 kg)  BMI 26.90 kg/m2  Review of Systems  Constitutional: Negative for fever.       He has lost weight, due to his efforts  HENT: Negative for hearing loss.   Eyes: Negative for visual disturbance.  Respiratory: Negative for shortness of breath.   Cardiovascular: Negative for chest pain.  Gastrointestinal: Negative for anal bleeding.  Endocrine: Positive for cold intolerance.  Genitourinary: Negative for hematuria.  Musculoskeletal: Negative for back pain.  Skin: Negative for rash.  Allergic/Immunologic: Negative for environmental allergies.  Neurological: Negative for syncope and light-headedness.  Psychiatric/Behavioral: Negative for dysphoric mood.  Objective:   Physical Exam VS: see vs page GEN: no distress HEAD: head: no deformity eyes: no periorbital swelling, no proptosis external nose and ears are normal mouth: no lesion seen NECK: supple, thyroid is not enlarged CHEST WALL: no deformity LUNGS: clear to auscultation BREASTS:  No gynecomastia CV: reg rate and rhythm, no murmur ABD: abdomen is soft, nontender.  no hepatosplenomegaly.  not distended.  no hernia.   RECTAL: normal external and internal exam.  heme neg. PROSTATE:  Normal size.  No nodule MUSCULOSKELETAL: muscle bulk and strength are grossly normal.  no obvious joint swelling.  gait is normal and steady EXTEMITIES: no deformity.  no ulcer on the feet.  feet are of normal color and temp.  no edema.  There is bilateral onychomycosis and varicosities.  There is a bandaid over a recent skin bx site on  the left anterior tibial area.  PULSES: dorsalis pedis intact bilat.  no carotid bruit NEURO:  cn 2-12 grossly intact.   readily moves all 4's.   SKIN:  Normal texture and temperature.  No rash or suspicious lesion is visible.   NODES:  None palpable at the neck PSYCH: alert, oriented x3.  Does not appear anxious nor depressed.    i reviewed electrocardiogram      Assessment & Plan:  Wellness visit today, with problems stable, except as noted. we discussed code status.  pt requests full code, but would not want to be started or maintained on artificial life-support measures if there was not a reasonable chance of recovery.      SEPARATE EVALUATION FOLLOWS--EACH PROBLEM HERE IS NEW, NOT RESPONDING TO TREATMENT, OR POSES SIGNIFICANT RISK TO THE PATIENT'S HEALTH: HISTORY OF THE PRESENT ILLNESS: Pt states few mos of slight numbness of the feet, but no assoc pain PAST MEDICAL HISTORY reviewed and up to date today REVIEW OF SYSTEMS: Denies weight change PHYSICAL EXAMINATION: VITAL SIGNS:  See vs page GENERAL: no distress sensation is intact to touch on the feet, but decreased from normal.  LAB/XRAY RESULTS: b-12=normal Lab Results  Component Value Date   CHOL 211* 07/22/2013   HDL 54.60 07/22/2013   LDLCALC  Value: 101        Total Cholesterol/HDL:CHD Risk Coronary Heart Disease Risk Table                     Men   Women  1/2 Average Risk   3.4   3.3  Average Risk       5.0   4.4  2 X Average Risk   9.6   7.1  3 X Average Risk  23.4   11.0        Use the calculated Patient Ratio above and the CHD Risk Table to determine the patient's CHD Risk.        ATP III CLASSIFICATION (LDL):  <100     mg/dL   Optimal  161-096  mg/dL   Near or Above                    Optimal  130-159  mg/dL   Borderline  045-409  mg/dL   High  >811     mg/dL   Very High* 05/17/7828   LDLDIRECT 145.2 07/22/2013   TRIG 84.0 07/22/2013   CHOLHDL 4 07/22/2013  IMPRESSION: Numbness, prob due to  alcoholism Dyslipidemia: he needs rx PLAN: See instruction page

## 2013-07-23 DIAGNOSIS — G629 Polyneuropathy, unspecified: Secondary | ICD-10-CM | POA: Insufficient documentation

## 2013-07-23 DIAGNOSIS — R319 Hematuria, unspecified: Secondary | ICD-10-CM | POA: Insufficient documentation

## 2013-07-24 ENCOUNTER — Encounter: Payer: Self-pay | Admitting: *Deleted

## 2013-07-25 ENCOUNTER — Other Ambulatory Visit: Payer: Self-pay | Admitting: Endocrinology

## 2013-07-25 MED ORDER — ATORVASTATIN CALCIUM 10 MG PO TABS: 10.0000 mg | ORAL_TABLET | Freq: Every day | ORAL | Status: DC

## 2013-08-20 ENCOUNTER — Ambulatory Visit (INDEPENDENT_AMBULATORY_CARE_PROVIDER_SITE_OTHER): Payer: Medicare Other | Admitting: Gastroenterology

## 2013-08-20 ENCOUNTER — Encounter: Payer: Self-pay | Admitting: Gastroenterology

## 2013-08-20 ENCOUNTER — Other Ambulatory Visit (INDEPENDENT_AMBULATORY_CARE_PROVIDER_SITE_OTHER): Payer: Medicare Other

## 2013-08-20 VITALS — BP 118/80 | HR 60 | Ht 71.0 in | Wt 193.6 lb

## 2013-08-20 DIAGNOSIS — Z7901 Long term (current) use of anticoagulants: Secondary | ICD-10-CM

## 2013-08-20 DIAGNOSIS — I4891 Unspecified atrial fibrillation: Secondary | ICD-10-CM

## 2013-08-20 DIAGNOSIS — Z86010 Personal history of colonic polyps: Secondary | ICD-10-CM

## 2013-08-20 DIAGNOSIS — K7460 Unspecified cirrhosis of liver: Secondary | ICD-10-CM

## 2013-08-20 DIAGNOSIS — F1020 Alcohol dependence, uncomplicated: Secondary | ICD-10-CM

## 2013-08-20 LAB — AFP TUMOR MARKER: AFP-Tumor Marker: 2.5 ng/mL (ref 0.0–8.0)

## 2013-08-20 LAB — PROTIME-INR
INR: 1.6 ratio — ABNORMAL HIGH (ref 0.8–1.0)
Prothrombin Time: 16.7 s — ABNORMAL HIGH (ref 10.2–12.4)

## 2013-08-20 NOTE — Progress Notes (Signed)
This is a somewhat complicated 72 year old Caucasian male with chronic atrial fibrillation on Xeralto 20 mg a day per cardiology.  He has appointment to see Dr. Graciela Husbands in March to decide what direction to take in terms of his atrial fibrillation.  He did have an associated TIA.  The patient had a polyp removed had colonoscopy in 2009, we will unable to complete colonoscopy because of a large ventral wall hernia which was subsequently fixed surgically.  So, in essence he has not had his right colon examined, and needs followup exam per his previous adenoma that was at the splenic flexure area.  I've scheduled him to have this done with Dr. Yancey Flemings in the spring after Dr. Graciela Husbands has given [place] a decision about his anticoagulation status and directions about holding this medication for colonoscopy and possible polypectomy.  The patient denies any lower GI or any hepatobiliary complaints.  He is an alcoholic and has begun drinking some again, but has not had recurrence of his alcoholic liver disease or alcoholic pancreatitis.  Recent labs reviewed his liver function test are normal.  Previous liver biopsy showed evidence of alcoholic liver disease but no definite cirrhosis.  He has no symptoms of cirrhosis such as mental status changes, edema, fluid retention, recurrent GI bleeding.  He would probably not be a bad idea the time of his colonoscopy and also perform endoscopy as a screen for possible varices.  He had ERCP by Dr. Russella Dar with removal of common duct stones in 2009.  His episode of pancreatitis was approximately one year ago and resolved with conservative management.  Currently his appetite is good and his weight is stable he denies any cardiac or pulmonary or gastrointestinal symptoms.  I am somewhat discouraged to hear these begun drinking wine again  Current Medications, Allergies, Past Medical History, Past Surgical History, Family History and Social History were reviewed in [employer] record.  ROS: All systems were reviewed and are negative unless otherwise stated in the HPI.   No Known Allergies Outpatient Prescriptions Prior to Visit  Medication Sig Dispense Refill  . allopurinol (ZYLOPRIM) 300 MG tablet Take 1 tablet (300 mg total) by mouth daily.  30 tablet  2  . metoprolol tartrate (LOPRESSOR) 25 MG tablet Take 1 tablet (25 mg total) by mouth 2 (two) times daily.  60 tablet  9  . minocycline (MINOCIN,DYNACIN) 50 MG capsule Take 100 mg by mouth 2 (two) times daily.       . Multiple Vitamin (MULTIVITAMIN) capsule Take 1 capsule by mouth daily.        . Rivaroxaban (XARELTO) 20 MG TABS tablet Take 1 tablet (20 mg total) by mouth daily.  30 tablet  6  . atorvastatin (LIPITOR) 10 MG tablet Take 1 tablet (10 mg total) by mouth daily.  90 tablet  3   No facility-administered medications prior to visit.   Past Medical History  Diagnosis Date  . Alcoholic cirrhosis of liver   . Personal history of colonic polyps 07/12/2008    TUBULAR ADENOMA  . Gout   . Atrial fibrillation   . Hyperlipemia   . Hypertension   . Alcohol dependence   . Nonspecific elevation of levels of transaminase or lactic acid dehydrogenase (LDH)   . TIA (transient ischemic attack)   . Pancreatitis    Past Surgical History  Procedure Laterality Date  . Inguinal hernia repair  2002  . Cardiac catheterization  09/1998  . Tumor removal    .  Cataract extraction  2008  . Cholecystectomy    . Hernia repair  2010   History   Social History  . Marital Status: Married    Spouse Name: N/A    Number of Children: 2  . Years of Education: N/A   Occupational History  . retired   .     Social History Main Topics  . Smoking status: Never Smoker   . Smokeless tobacco: Never Used  . Alcohol Use: Yes     Comment: heavy daily use and alcoholism. hadn't drank since Oct 15 2012  . Drug Use: Yes    Special: Other-see comments  . Sexual Activity: None   Other Topics Concern  . None    Social History Narrative  . None   Family History  Problem Relation Age of Onset  . Lung cancer Mother   . Skin cancer Sister             Physical Exam: Blood pressure 118/80, pulse 60 and irregular and weight 193 the BMI of 27.01.  He has bruising on his forearms which is rather severe.  Chest is clear and he appears to be in control but irregular rhythm.  His abdomen shows a palpable liver in the right upper quadrant with some slight lobularity but no tenderness or definite enlargement.  There is no splenomegaly, abdominal ascites or peripheral edema.  Mental status is normal.  There also were no areas of tenderness in the abdomen or any mass lesions.    Assessment and Plan: Complex patient with multiple GI issues.  Current problems revolve around followup of his previous colon polyp removed in 2009 with incomplete exam because of a ventral wall hernia that has subsequently been repaired.  Before his followup colonoscopy was to be completed, he has had problems with retained common bile duct stones, alcoholic liver disease, and alcoholic pancreatitis.  He appears to be in as good health now is I've seen him in several years.  Hopefully he will not have a relapse of his problems with use of alcohol.  In any case, we will wait his visit with Dr. Berton Mount in cardiology for guidance as to his anticoagulation at the time of future colonoscopy.  He is to call Dr. Marina Goodell esophagus after he sees Dr. Graciela Husbands to schedule this exam since I will be retired at that point.  Also is followed medically by Dr. Everardo All.  I did order today prothrombin time and alpha-fetoprotein level.  MRCP in February of this year showed no evidence of retained common duct stones, there is a patent portal vein, and a lobular cirrhotic appearing liver without any evidence of pancreatic ductal enlargement or mass lesions.  I've encouraged patient to avoid alcohol completely if at all possible, have spoken extensively with him  and his wife concerning alcohol outpatient therapy, and hopefully again he will do well and not relapse severely.  CC: Berton Mount in cardiology and Dr. Romero Belling in primary care and Dr. Yancey Flemings

## 2013-08-20 NOTE — Patient Instructions (Signed)
Please go to the basement for lab work before leaving today   Please call in March and ask to have colonoscopy scheduled with Dr. Marina Goodell

## 2013-11-02 ENCOUNTER — Other Ambulatory Visit: Payer: Self-pay

## 2013-11-02 MED ORDER — ALLOPURINOL 300 MG PO TABS: 300.0000 mg | ORAL_TABLET | Freq: Every day | ORAL | Status: DC

## 2013-11-10 ENCOUNTER — Telehealth: Payer: Self-pay | Admitting: Gastroenterology

## 2013-11-10 NOTE — Telephone Encounter (Signed)
Spoke with patient and gave him lab results from December. Patient states he is going to see Dr. Caryl Comes later this month and will ask him about having a colonoscopy and if he is able to stop Xarelto for procedure.

## 2013-11-23 ENCOUNTER — Ambulatory Visit (INDEPENDENT_AMBULATORY_CARE_PROVIDER_SITE_OTHER): Payer: Medicare HMO | Admitting: Internal Medicine

## 2013-11-23 ENCOUNTER — Encounter: Payer: Self-pay | Admitting: Internal Medicine

## 2013-11-23 VITALS — BP 128/75 | HR 46 | Ht 71.0 in | Wt 189.0 lb

## 2013-11-23 DIAGNOSIS — I4891 Unspecified atrial fibrillation: Secondary | ICD-10-CM

## 2013-11-23 NOTE — Patient Instructions (Signed)
Your physician recommends that you continue on your current medications as directed. Please refer to the Current Medication list given to you today.  Your physician wants you to follow-up in: 1 year with Dr. Klein.  You will receive a reminder letter in the mail two months in advance. If you don't receive a letter, please call our office to schedule the follow-up appointment.  

## 2013-11-23 NOTE — Progress Notes (Signed)
  HPI  Nicholas Bird is a 73 y.o. male seen in followup for atrial fibrillation which is permanent. He had been doing very well until April 15 2009 at which time he developed transient aphasia . It was felt that this was a TIA. There is apparently some lack of consensus as to whether this was a thromboembolic episode or not.  We ultimately decided to put him on oral anticoagulation therapy. The Coumadin is quite a nuisance.He thus ended up on Pradaxa. GI symptoms however intervened and he was changed to  Rivaroxaban   He is in no significant functional complaints. \he was recently hospitalized for pancreatitis. He is refraining from alcohol since then  He has been doing relatively well.   He is to have colonoscopy  Hx of polyps    Past Medical History  Diagnosis Date  . Alcoholic cirrhosis of liver   . Personal history of colonic polyps 07/12/2008    TUBULAR ADENOMA  . Gout   . Atrial fibrillation   . Hyperlipemia   . Hypertension   . Alcohol dependence   . Nonspecific elevation of levels of transaminase or lactic acid dehydrogenase (LDH)   . TIA (transient ischemic attack)   . Pancreatitis     Past Surgical History  Procedure Laterality Date  . Inguinal hernia repair  2002  . Cardiac catheterization  09/1998  . Tumor removal    . Cataract extraction  2008  . Cholecystectomy    . Hernia repair  2010    Current Outpatient Prescriptions  Medication Sig Dispense Refill  . allopurinol (ZYLOPRIM) 300 MG tablet Take 1 tablet (300 mg total) by mouth daily.  30 tablet  2  . metoprolol tartrate (LOPRESSOR) 25 MG tablet Take 1 tablet (25 mg total) by mouth 2 (two) times daily.  60 tablet  9  . minocycline (MINOCIN,DYNACIN) 50 MG capsule Take 100 mg by mouth 2 (two) times daily.       . Multiple Vitamin (MULTIVITAMIN) capsule Take 1 capsule by mouth daily.        . Rivaroxaban (XARELTO) 20 MG TABS tablet Take 1 tablet (20 mg total) by mouth daily.  30 tablet  6   No current  facility-administered medications for this visit.    No Known Allergies  Review of Systems negative except from HPI and PMH  Physical Exam BP 128/75  Pulse 46  Ht 5\' 11"  (1.803 m)  Wt 189 lb (85.73 kg)  BMI 26.37 kg/m2 Well developed and well nourished in no acute distress HENT normal E scleral and icterus clear Neck Supple JVP flat; carotids brisk and full Clear to ausculation irregularly irregular no murmurs gallops or rub Soft with active bowel sounds No clubbing cyanosis none Edema Alert and oriented, grossly normal motor and sensory function Skin Warm and Dry  discoloraation  Of skin   afib 56 -/09/43  Assessment and  Plan  Atrial fibrillation  Prior TIA  Doing well  Will continue Rivaroxaban and this can be stopped 36hrs prior to colonoscopy

## 2013-11-30 ENCOUNTER — Other Ambulatory Visit: Payer: Self-pay | Admitting: Dermatology

## 2013-11-30 ENCOUNTER — Other Ambulatory Visit: Payer: Self-pay | Admitting: Urology

## 2013-12-01 ENCOUNTER — Telehealth: Payer: Self-pay | Admitting: Internal Medicine

## 2013-12-01 ENCOUNTER — Other Ambulatory Visit: Payer: Self-pay

## 2013-12-01 MED ORDER — RIVAROXABAN 20 MG PO TABS: 20.0000 mg | ORAL_TABLET | Freq: Every day | ORAL | Status: DC

## 2013-12-01 NOTE — Telephone Encounter (Signed)
Walk In pt Form " Sealed Envelope" gave to [facility]

## 2013-12-07 ENCOUNTER — Encounter (HOSPITAL_BASED_OUTPATIENT_CLINIC_OR_DEPARTMENT_OTHER): Payer: Self-pay | Admitting: *Deleted

## 2013-12-09 ENCOUNTER — Encounter (HOSPITAL_BASED_OUTPATIENT_CLINIC_OR_DEPARTMENT_OTHER): Payer: Self-pay | Admitting: *Deleted

## 2013-12-09 NOTE — Progress Notes (Signed)
NPO AFTER MN. ARRIVE AT 0615. NEEDS ISTAT. CURRENT EKG IN CHART AND EPIC. WILL TAKE LOPRESSOR AND ALLOPURINOL AM DOS W/ SIPS OF WATER. LAST DAY TO TAKE XARELTO IS 12-11-2013, PT VERBALIZED UNDERSTANDING.

## 2013-12-09 NOTE — Telephone Encounter (Signed)
Formed dropped off was explaining that he is going to have procedure 4/13 (looking at bladder/possible polyp) under general anesthesia, and Dr. Alan Ripper office requesting pt stop Xarelto 5 days prior to procedure.  Dr. Caryl Comes sent message to Dr. Diona Fanti explaining no reason to stop for 5 days - 48 hours is plenty of time. Left detailed message for patient explaining this and to contact Dr. Keturah Barre office for further instructions.

## 2013-12-13 NOTE — Anesthesia Preprocedure Evaluation (Signed)
Anesthesia Evaluation  Patient identified by MRN, date of birth, ID band Patient awake    Reviewed: Allergy & Precautions, H&P , NPO status , Patient's Chart, lab work & pertinent test results, reviewed documented beta blocker date and time   Airway Mallampati: II TM Distance: >3 FB Neck ROM: Full    Dental  (+) Dental Advisory Given   Pulmonary pneumonia -, resolved,  breath sounds clear to auscultation        Cardiovascular hypertension, Pt. on medications and Pt. on home beta blockers + dysrhythmias Atrial Fibrillation Rhythm:Regular Rate:Normal     Neuro/Psych PSYCHIATRIC DISORDERS Peripheral neuropathy negative neurological ROS     GI/Hepatic GERD-  ,(+) Cirrhosis -    substance abuse  alcohol use,   Endo/Other  negative endocrine ROS  Renal/GU negative Renal ROS     Musculoskeletal negative musculoskeletal ROS (+)   Abdominal   Peds  Hematology negative hematology ROS (+)   Anesthesia Other Findings   Reproductive/Obstetrics negative OB ROS                           Anesthesia Physical Anesthesia Plan  ASA: III  Anesthesia Plan: General   Post-op Pain Management:    Induction: Intravenous  Airway Management Planned: LMA  Additional Equipment:   Intra-op Plan:   Post-operative Plan: Extubation in OR  Informed Consent: I have reviewed the patients History and Physical, chart, labs and discussed the procedure including the risks, benefits and alternatives for the proposed anesthesia with the patient or authorized representative who has indicated his/her understanding and acceptance.   Dental advisory given  Plan Discussed with: CRNA  Anesthesia Plan Comments:         Anesthesia Quick Evaluation

## 2013-12-14 ENCOUNTER — Ambulatory Visit (HOSPITAL_BASED_OUTPATIENT_CLINIC_OR_DEPARTMENT_OTHER): Payer: Medicare HMO | Admitting: Anesthesiology

## 2013-12-14 ENCOUNTER — Ambulatory Visit (HOSPITAL_BASED_OUTPATIENT_CLINIC_OR_DEPARTMENT_OTHER)
Admission: RE | Admit: 2013-12-14 | Discharge: 2013-12-14 | Disposition: A | Discharge status: Home / Self Care | Payer: Medicare HMO | Source: Ambulatory Visit | Attending: Urology | Admitting: Urology

## 2013-12-14 ENCOUNTER — Encounter (HOSPITAL_BASED_OUTPATIENT_CLINIC_OR_DEPARTMENT_OTHER): Payer: Self-pay | Admitting: Anesthesiology

## 2013-12-14 ENCOUNTER — Other Ambulatory Visit: Payer: Self-pay

## 2013-12-14 ENCOUNTER — Encounter (HOSPITAL_BASED_OUTPATIENT_CLINIC_OR_DEPARTMENT_OTHER): Payer: Medicare HMO | Admitting: Anesthesiology

## 2013-12-14 ENCOUNTER — Encounter (HOSPITAL_BASED_OUTPATIENT_CLINIC_OR_DEPARTMENT_OTHER)
Admission: RE | Disposition: A | Discharge status: Home / Self Care | Payer: Self-pay | Source: Ambulatory Visit | Attending: Urology

## 2013-12-14 DIAGNOSIS — K7030 Alcoholic cirrhosis of liver without ascites: Secondary | ICD-10-CM | POA: Insufficient documentation

## 2013-12-14 DIAGNOSIS — E785 Hyperlipidemia, unspecified: Secondary | ICD-10-CM | POA: Insufficient documentation

## 2013-12-14 DIAGNOSIS — N400 Enlarged prostate without lower urinary tract symptoms: Secondary | ICD-10-CM | POA: Insufficient documentation

## 2013-12-14 DIAGNOSIS — Z7901 Long term (current) use of anticoagulants: Secondary | ICD-10-CM | POA: Insufficient documentation

## 2013-12-14 DIAGNOSIS — F1020 Alcohol dependence, uncomplicated: Secondary | ICD-10-CM | POA: Insufficient documentation

## 2013-12-14 DIAGNOSIS — N529 Male erectile dysfunction, unspecified: Secondary | ICD-10-CM | POA: Insufficient documentation

## 2013-12-14 DIAGNOSIS — M109 Gout, unspecified: Secondary | ICD-10-CM | POA: Insufficient documentation

## 2013-12-14 DIAGNOSIS — I4891 Unspecified atrial fibrillation: Secondary | ICD-10-CM | POA: Insufficient documentation

## 2013-12-14 DIAGNOSIS — N323 Diverticulum of bladder: Secondary | ICD-10-CM | POA: Insufficient documentation

## 2013-12-14 DIAGNOSIS — G609 Hereditary and idiopathic neuropathy, unspecified: Secondary | ICD-10-CM | POA: Insufficient documentation

## 2013-12-14 DIAGNOSIS — L719 Rosacea, unspecified: Secondary | ICD-10-CM | POA: Insufficient documentation

## 2013-12-14 DIAGNOSIS — R319 Hematuria, unspecified: Secondary | ICD-10-CM

## 2013-12-14 DIAGNOSIS — I10 Essential (primary) hypertension: Secondary | ICD-10-CM | POA: Insufficient documentation

## 2013-12-14 HISTORY — DX: Male erectile dysfunction, unspecified: N52.9

## 2013-12-14 HISTORY — DX: Other specified abnormal findings of blood chemistry: R79.89

## 2013-12-14 HISTORY — DX: Diverticulum of bladder: N32.3

## 2013-12-14 HISTORY — DX: Personal history of other diseases of the digestive system: Z87.19

## 2013-12-14 HISTORY — DX: Long term (current) use of anticoagulants: Z79.01

## 2013-12-14 HISTORY — DX: Hematuria, unspecified: R31.9

## 2013-12-14 HISTORY — DX: Permanent atrial fibrillation: I48.21

## 2013-12-14 HISTORY — DX: Personal history of transient ischemic attack (TIA), and cerebral infarction without residual deficits: Z86.73

## 2013-12-14 HISTORY — DX: Benign prostatic hyperplasia without lower urinary tract symptoms: N40.0

## 2013-12-14 HISTORY — PX: CYSTOSCOPY WITH BIOPSY: SHX5122

## 2013-12-14 HISTORY — DX: Rosacea, unspecified: L71.9

## 2013-12-14 HISTORY — DX: Abnormal results of liver function studies: R94.5

## 2013-12-14 LAB — POCT I-STAT, CHEM 8
BUN: 17 mg/dL (ref 6–23)
CALCIUM ION: 1.25 mmol/L (ref 1.13–1.30)
Chloride: 100 mEq/L (ref 96–112)
Creatinine, Ser: 0.9 mg/dL (ref 0.50–1.35)
GLUCOSE: 98 mg/dL (ref 70–99)
HCT: 42 % (ref 39.0–52.0)
Hemoglobin: 14.3 g/dL (ref 13.0–17.0)
Potassium: 4 mEq/L (ref 3.7–5.3)
Sodium: 139 mEq/L (ref 137–147)
TCO2: 25 mmol/L (ref 0–100)

## 2013-12-14 SURGERY — CYSTOSCOPY, WITH BIOPSY
Anesthesia: General | Site: Bladder

## 2013-12-14 MED ORDER — BELLADONNA ALKALOIDS-OPIUM 16.2-60 MG RE SUPP
RECTAL | Status: AC
  Filled 2013-12-14: qty 1

## 2013-12-14 MED ORDER — LACTATED RINGERS IV SOLN
INTRAVENOUS | Status: DC
  Administered 2013-12-14: 07:00:00 via INTRAVENOUS
  Filled 2013-12-14: qty 1000

## 2013-12-14 MED ORDER — MIDAZOLAM HCL 5 MG/5ML IJ SOLN
INTRAMUSCULAR | Status: DC | PRN
  Administered 2013-12-14 (×2): 1 mg via INTRAVENOUS

## 2013-12-14 MED ORDER — DEXAMETHASONE SODIUM PHOSPHATE 4 MG/ML IJ SOLN
INTRAMUSCULAR | Status: DC | PRN
  Administered 2013-12-14: 4 mg via INTRAVENOUS

## 2013-12-14 MED ORDER — MIDAZOLAM HCL 2 MG/2ML IJ SOLN
INTRAMUSCULAR | Status: AC
Start: 2013-12-14 — End: 2013-12-14
  Filled 2013-12-14: qty 2

## 2013-12-14 MED ORDER — STERILE WATER FOR IRRIGATION IR SOLN
Status: DC | PRN
  Administered 2013-12-14: 3000 mL

## 2013-12-14 MED ORDER — CEFAZOLIN SODIUM 1-5 GM-% IV SOLN
1.0000 g | INTRAVENOUS | Status: DC
  Filled 2013-12-14: qty 50

## 2013-12-14 MED ORDER — URIBEL 118 MG PO CAPS: 1.0000 | ORAL_CAPSULE | Freq: Three times a day (TID) | ORAL | Status: DC | PRN

## 2013-12-14 MED ORDER — GLYCOPYRROLATE 0.2 MG/ML IJ SOLN
INTRAMUSCULAR | Status: DC | PRN
Start: 2013-12-14 — End: 2013-12-14
  Administered 2013-12-14: 0.2 mg via INTRAVENOUS

## 2013-12-14 MED ORDER — OXYCODONE HCL 5 MG/5ML PO SOLN
5.0000 mg | Freq: Once | ORAL | Status: DC | PRN
  Filled 2013-12-14: qty 5

## 2013-12-14 MED ORDER — PROMETHAZINE HCL 25 MG/ML IJ SOLN
6.2500 mg | INTRAMUSCULAR | Status: DC | PRN
  Filled 2013-12-14: qty 1

## 2013-12-14 MED ORDER — FENTANYL CITRATE 0.05 MG/ML IJ SOLN
INTRAMUSCULAR | Status: DC | PRN
  Administered 2013-12-14: 12.5 ug via INTRAVENOUS
  Administered 2013-12-14: 25 ug via INTRAVENOUS
  Administered 2013-12-14: 50 ug via INTRAVENOUS
  Administered 2013-12-14: 12.5 ug via INTRAVENOUS

## 2013-12-14 MED ORDER — FENTANYL CITRATE 0.05 MG/ML IJ SOLN
INTRAMUSCULAR | Status: AC
  Filled 2013-12-14: qty 4

## 2013-12-14 MED ORDER — ONDANSETRON HCL 4 MG/2ML IJ SOLN
INTRAMUSCULAR | Status: DC | PRN
  Administered 2013-12-14: 4 mg via INTRAVENOUS

## 2013-12-14 MED ORDER — HYDROMORPHONE HCL PF 1 MG/ML IJ SOLN
0.2500 mg | INTRAMUSCULAR | Status: DC | PRN
  Filled 2013-12-14: qty 1

## 2013-12-14 MED ORDER — LIDOCAINE HCL (CARDIAC) 20 MG/ML IV SOLN
INTRAVENOUS | Status: DC | PRN
  Administered 2013-12-14: 60 mg via INTRAVENOUS

## 2013-12-14 MED ORDER — MEPERIDINE HCL 25 MG/ML IJ SOLN
6.2500 mg | INTRAMUSCULAR | Status: DC | PRN
  Filled 2013-12-14: qty 1

## 2013-12-14 MED ORDER — OXYCODONE HCL 5 MG PO TABS
5.0000 mg | ORAL_TABLET | Freq: Once | ORAL | Status: DC | PRN
  Filled 2013-12-14: qty 1

## 2013-12-14 MED ORDER — PROPOFOL 10 MG/ML IV BOLUS
INTRAVENOUS | Status: DC | PRN
  Administered 2013-12-14: 180 mg via INTRAVENOUS

## 2013-12-14 MED ORDER — LACTATED RINGERS IV SOLN
INTRAVENOUS | Status: DC | PRN
  Administered 2013-12-14: 07:00:00 via INTRAVENOUS

## 2013-12-14 MED ORDER — CEFAZOLIN SODIUM-DEXTROSE 2-3 GM-% IV SOLR
2.0000 g | INTRAVENOUS | Status: AC
  Administered 2013-12-14: 2 g via INTRAVENOUS
  Filled 2013-12-14: qty 50

## 2013-12-14 MED ORDER — SULFAMETHOXAZOLE-TMP DS 800-160 MG PO TABS: 1.0000 | ORAL_TABLET | Freq: Two times a day (BID) | ORAL | Status: DC

## 2013-12-14 MED ORDER — KETOROLAC TROMETHAMINE 30 MG/ML IJ SOLN
INTRAMUSCULAR | Status: DC | PRN
  Administered 2013-12-14: 30 mg via INTRAVENOUS

## 2013-12-14 SURGICAL SUPPLY — 20 items
BAG DRAIN URO-CYSTO SKYTR STRL (DRAIN) ×2 IMPLANT
BAG DRN UROCATH (DRAIN) ×1
CANISTER SUCT LVC 12 LTR MEDI- (MISCELLANEOUS) ×2 IMPLANT
CLOTH BEACON ORANGE TIMEOUT ST (SAFETY) ×2 IMPLANT
DRAPE CAMERA CLOSED 9X96 (DRAPES) ×2 IMPLANT
ELECT REM PT RETURN 9FT ADLT (ELECTROSURGICAL) ×2
ELECTRODE REM PT RTRN 9FT ADLT (ELECTROSURGICAL) ×1 IMPLANT
GLOVE BIO SURGEON STRL SZ8 (GLOVE) ×2 IMPLANT
GLOVE SURG SS PI 7.5 STRL IVOR (GLOVE) ×2 IMPLANT
GOWN PREVENTION PLUS LG XLONG (DISPOSABLE) IMPLANT
GOWN STRL REIN XL XLG (GOWN DISPOSABLE) IMPLANT
GOWN STRL REUS W/ TWL XL LVL3 (GOWN DISPOSABLE) ×1 IMPLANT
GOWN STRL REUS W/TWL XL LVL3 (GOWN DISPOSABLE) ×4 IMPLANT
NDL SAFETY ECLIPSE 18X1.5 (NEEDLE) IMPLANT
NEEDLE HYPO 18GX1.5 SHARP (NEEDLE)
NEEDLE HYPO 22GX1.5 SAFETY (NEEDLE) IMPLANT
NS IRRIG 500ML POUR BTL (IV SOLUTION) IMPLANT
PACK CYSTOSCOPY (CUSTOM PROCEDURE TRAY) ×2 IMPLANT
SYR 20CC LL (SYRINGE) IMPLANT
WATER STERILE IRR 3000ML UROMA (IV SOLUTION) ×2 IMPLANT

## 2013-12-14 NOTE — Anesthesia Postprocedure Evaluation (Signed)
Anesthesia Post Note  Patient: Nicholas Bird  Procedure(s) Performed: Procedure(s) (LRB): CYSTOSCOPY WITH BIOPSY (N/A)  Anesthesia type: General  Patient location: PACU  Post pain: Pain level controlled  Post assessment: Post-op Vital signs reviewed  Last Vitals: BP 117/72  Pulse 50  Temp(Src) 37.2 C (Oral)  Resp 15  Ht 5\' 11"  (1.803 m)  Wt 198 lb (89.812 kg)  BMI 27.63 kg/m2  SpO2 98%  Post vital signs: Reviewed  Level of consciousness: sedated  Complications: No apparent anesthesia complications

## 2013-12-14 NOTE — Discharge Instructions (Signed)
°  Post Anesthesia Home Care Instructions  Activity: Get plenty of rest for the remainder of the day. A responsible adult should stay with you for 24 hours following the procedure.  For the next 24 hours, DO NOT: -Drive a car -Paediatric nurse -Drink alcoholic beverages -Take any medication unless instructed by your physician -Make any legal decisions or sign important papers.  Meals: Start with liquid foods such as gelatin or soup. Progress to regular foods as tolerated. Avoid greasy, spicy, heavy foods. If nausea and/or vomiting occur, drink only clear liquids until the nausea and/or vomiting subsides. Call your physician if vomiting continues.  Special Instructions/Symptoms: 1. Your throat may feel dry or sore from the anesthesia or the breathing tube placed in your throat during surgery. If this causes discomfort, gargle with warm salt water. The discomfort should disappear wYou may see some blood in the urine and may have some burning with urination for 48-72 hours. You also may notice that you have to urinate more frequently or urgently after your procedure which is normal.  2. You should call should you develop an inability urinate, fever > 101, persistent nausea and vomiting that prevents you from eating or drinking to stay hydrated.   You may also see some blood in the urine.  A very small amount of blood can make the urine look quite red.    It is okay to start the blood thinner back on Tuesday if the urine is clear  Take the sulfa tablet twice daily for 3 days, take the uribel for 2-3 days if needed for burning when you urinate  Dr. Diona Fanti will call you with biopsy results and followup

## 2013-12-14 NOTE — Anesthesia Procedure Notes (Signed)
Procedure Name: LMA Insertion Date/Time: 12/14/2013 7:55 AM Performed by: Justice Rocher Pre-anesthesia Checklist: Patient identified, Emergency Drugs available, Suction available and Patient being monitored Patient Re-evaluated:Patient Re-evaluated prior to inductionOxygen Delivery Method: Circle System Utilized Preoxygenation: Pre-oxygenation with 100% oxygen Intubation Type: IV induction Ventilation: Mask ventilation without difficulty LMA: LMA inserted LMA Size: 5.0 Number of attempts: 1 Airway Equipment and Method: bite block Placement Confirmation: positive ETCO2 Tube secured with: Tape Dental Injury: Teeth and Oropharynx as per pre-operative assessment

## 2013-12-14 NOTE — Op Note (Signed)
PATIENT:  Nicholas Bird  PRE-OPERATIVE DIAGNOSIS: Gross hematuria with bladder diverticulum  POST-OPERATIVE DIAGNOSIS: Same, no evidence of bladder tumor  PROCEDURE: Cystoscopy, biopsy of bladder diverticulum  SURGEON:  Lillette Boxer. Levante Simones, M.D.  ANESTHESIA:  General  EBL:  Minimal  DRAINS: None  LOCAL MEDICATIONS USED:  None  SPECIMEN:  Bladder biopsies  INDICATION: Nicholas Bird is a   Description of procedure: The patient was properly identified and marked (if applicable) in the holding area. They were then  taken to the operating room and placed on the table in a supine position. General anesthesia was then administered. Once fully anesthetized the patient was moved to the dorsolithotomy position and the genitalia and perineum were sterilely prepped and draped in standard fashion. An official timeout was then performed.  A 22 French panendoscope was advanced under direct vision through his urethra. There were no urethral lesions. Prostate displayed obstruction with trilobar hypertrophy with the median lobe. I saw no evident lesions on the prostatic urethra. His bladder was entered and inspected circumferentially with both the 70 and 12 lenses. There were 1+ trabeculations. A couple of small cellules were noted posteriorly. No significant bladder lesions were seen. There were no foreign bodies. Ureteral orifices were normal in configuration and location. There was a narrow amount bladder diverticulum superior to the trigone on the left. This was entered with both the 12 and 70 lens. There were multiple plantar ectatic vessel seen, but no raised lesions. Most of the urothelium in the bladder diverticulum was normal. Using the cold cup biopsy forceps and the 12 lens, it took 3 pinch biopsies of the bladder diverticular urothelium. These were sent as bladder diverticulum biopsies. I then used the Bugbee electrode to cauterize the biopsy sites as well as a, and ectatic lesions. At this  point there was adequate hemostasis. There being no other bladder pathology, the bladder was drained and the scope removed. The patient tolerated the procedure well.    PLAN OF CARE: Discharge to home after PACU  PATIENT DISPOSITION:  PACU - hemodynamically stable.

## 2013-12-14 NOTE — Transfer of Care (Signed)
Immediate Anesthesia Transfer of Care Note  Patient: Nicholas Bird  Procedure(s) Performed: Procedure(s) (LRB): CYSTOSCOPY WITH BIOPSY (N/A)  Patient Location: PACU  Anesthesia Type: General  Level of Consciousness: awake, sedated, patient cooperative and responds to stimulation  Airway & Oxygen Therapy: Patient Spontanous Breathing and Patient connected to face mask oxygen  Post-op Assessment: Report given to PACU RN, Post -op Vital signs reviewed and stable and Patient moving all extremities  Post vital signs: Reviewed and stable  Complications: No apparent anesthesia complications

## 2013-12-14 NOTE — H&P (Signed)
Urology History and Physical Exam  CC: Blood in urinbe  HPI: 73 year old male presents for cystoscopy and possible bladder biopsy. He was found to have a bladder diverticulum on CT/cystoscopy following episodes of gross hematuria. There was evidence of a filling defect in the diverticulum on CT. Access into the diverticulum with a flexible scope in the office was impossible. He presents now for anesthetic cystoscopy and possible bladder biopsy.  PMH: Past Medical History  Diagnosis Date  . Alcoholic cirrhosis of liver   . Personal history of colonic polyps 07/12/2008    TUBULAR ADENOMA  . Gout     per pt 12-09-2013  gout is stable  . Hyperlipemia   . Hypertension   . Alcohol dependence   . Atrial fibrillation, permanent     CARIOLOGIST-  DR Caryl Comes  . History of TIA (transient ischemic attack)     04/2009--  no residual  . History of pancreatitis     10/2012--  resolved   . Anticoagulant long-term use   . Elevated LFTs     CHRONIC  . Bladder diverticulum   . Hematuria   . BPH (benign prostatic hypertrophy)   . ED (erectile dysfunction) of organic origin   . Rosacea     PSH: Past Surgical History  Procedure Laterality Date  . Inguinal hernia repair Left 01-14-2001  . Laparoscopic cholecystectomy  10-21-2008    AND LIVER BX'S  . Laparoscopic inguinal hernia repair Bilateral 05-22-2009  . Cataract extraction w/ intraocular lens  implant, bilateral Bilateral 2008  . Cardiac catheterization  09-19-1998    false positive stress test---  normal cath and preserved lvf    Allergies: No Known Allergies  Medications: No prescriptions prior to admission     Social History: History   Social History  . Marital Status: Married    Spouse Name: N/A    Number of Children: 2  . Years of Education: N/A   Occupational History  . retired   .     Social History Main Topics  . Smoking status: Never Smoker   . Smokeless tobacco: Never Used  . Alcohol Use: 8.4 oz/week    14  Shots of liquor per week     Comment: 3 oz. per day  . Drug Use: No  . Sexual Activity: Not on file   Other Topics Concern  . Not on file   Social History Narrative  . No narrative on file    Family History: Family History  Problem Relation Age of Onset  . Lung cancer Mother   . Skin cancer Sister     Review of Systems: Positive: Gross hematuria Negative:   A further 10 point review of systems was negative except what is listed in                 the HPI.  Physical Exam: @VITALS2 @ General: No acute distress.  Awake. Head:  Normocephalic.  Atraumatic. ENT:  EOMI.  Mucous membranes moist Neck:  Supple.  No lymphadenopathy. CV:  S1 present. S2 present. Regular rate. Pulmonary: Equal effort bilaterally.  Clear to auscultation bilaterally. Abdomen: Soft.  Nontender to palpation. Skin:  Normal turgor.  No visible rash. Extremity: No gross deformity of bilateral upper extremities.  No gross deformity of                             lower extremities. Neurologic: Alert. Appropriate mood.    Studies:  No results found for this basename: HGB, WBC, PLT,  in the last 72 hours  No results found for this basename: NA, K, CL, CO2, BUN, CREATININE, CALCIUM, MAGNESIUM, GFRNONAA, GFRAA,  in the last 72 hours   No results found for this basename: PT, INR, APTT,  in the last 72 hours   No components found with this basename: ABG,     Assessment:  Gross hematuria, bladder diverticulum with filling defect  Plan: Anesthetic cystoscopy, possible bladder biopsy

## 2013-12-15 ENCOUNTER — Encounter (HOSPITAL_BASED_OUTPATIENT_CLINIC_OR_DEPARTMENT_OTHER): Payer: Self-pay | Admitting: Urology

## 2013-12-28 ENCOUNTER — Telehealth: Payer: Self-pay | Admitting: Internal Medicine

## 2013-12-28 NOTE — Telephone Encounter (Signed)
Walk in pt Form " Sealed Envelope" Will Give to Sherri on Tuesday

## 2013-12-30 ENCOUNTER — Telehealth: Payer: Self-pay | Admitting: *Deleted

## 2013-12-30 NOTE — Telephone Encounter (Addendum)
Pt dropped off letter explaining hematuria when resuming Xarelto after bladder scoping on 4/13. He explains that he was taking medication every other day, after resuming, and on the days he took med he would see blood in urine.  Dr. Caryl Comes called and spoke with patient.  They discussed pt f/u with urologist in couple weeks and considering/questioning cauterization. They discussed left atrial appendage occlusion. We are going to leave samples of Eliquis at front desk and have patient try to see if this resolves issue. Dr. Caryl Comes also left message for Dr. Diona Fanti (urologist) to call back to discuss plan of care.

## 2013-12-31 ENCOUNTER — Telehealth: Payer: Self-pay | Admitting: *Deleted

## 2013-12-31 NOTE — Telephone Encounter (Signed)
Spoke with pt's wife (he was sitting next to her, talking in background) and explained I was leaving a weeks worth of Eliquis samples at the front desk for them to pick up. I informed them that we are waiting for more samples to possibly arrive in office today. Pt will wait till late afternoon to stop by, and if need be will come by office another day to pick up samples when we obtain more.

## 2014-01-14 ENCOUNTER — Telehealth: Payer: Self-pay | Admitting: *Deleted

## 2014-01-14 ENCOUNTER — Other Ambulatory Visit: Payer: Self-pay | Admitting: *Deleted

## 2014-01-14 MED ORDER — APIXABAN 5 MG PO TABS: 5.0000 mg | ORAL_TABLET | Freq: Two times a day (BID) | ORAL | Status: DC

## 2014-01-14 NOTE — Telephone Encounter (Signed)
PA to [place] for [facility], had hematuria on xarelto according to patient.

## 2014-01-14 NOTE — Telephone Encounter (Signed)
Patient called for eliquis samples, we are currently out so he asked that I send him in an rx.

## 2014-01-15 ENCOUNTER — Other Ambulatory Visit: Payer: Self-pay | Admitting: *Deleted

## 2014-02-02 NOTE — Telephone Encounter (Signed)
[employer] approved eliquis dates 09/03/2013-09/02/2014

## 2014-02-05 ENCOUNTER — Other Ambulatory Visit: Payer: Self-pay

## 2014-02-05 MED ORDER — ALLOPURINOL 300 MG PO TABS: 300.0000 mg | ORAL_TABLET | Freq: Every day | ORAL | Status: DC

## 2014-02-18 ENCOUNTER — Other Ambulatory Visit: Payer: Self-pay | Admitting: *Deleted

## 2014-02-18 MED ORDER — APIXABAN 5 MG PO TABS: 5.0000 mg | ORAL_TABLET | Freq: Two times a day (BID) | ORAL | Status: DC

## 2014-02-22 ENCOUNTER — Telehealth: Payer: Self-pay | Admitting: *Deleted

## 2014-02-22 NOTE — Telephone Encounter (Signed)
Patient request tier exception for his Eliquis, his monthly co-pay is $161.00 month

## 2014-03-02 NOTE — Telephone Encounter (Signed)
Per [employer] an exception has already been granted for the eliquis 60 tablets for 30 days

## 2014-04-16 ENCOUNTER — Other Ambulatory Visit: Payer: Self-pay

## 2014-04-16 MED ORDER — METOPROLOL TARTRATE 25 MG PO TABS: 25.0000 mg | ORAL_TABLET | Freq: Two times a day (BID) | ORAL | Status: DC

## 2014-05-12 ENCOUNTER — Other Ambulatory Visit: Payer: Self-pay

## 2014-05-12 MED ORDER — ALLOPURINOL 300 MG PO TABS: 300.0000 mg | ORAL_TABLET | Freq: Every day | ORAL | Status: DC

## 2014-06-30 ENCOUNTER — Encounter: Payer: Self-pay | Admitting: Internal Medicine

## 2014-06-30 ENCOUNTER — Ambulatory Visit (INDEPENDENT_AMBULATORY_CARE_PROVIDER_SITE_OTHER): Payer: Medicare HMO | Admitting: Internal Medicine

## 2014-06-30 VITALS — BP 118/60 | HR 57 | Ht 71.0 in | Wt 193.6 lb

## 2014-06-30 DIAGNOSIS — Z86010 Personal history of colonic polyps: Secondary | ICD-10-CM

## 2014-06-30 DIAGNOSIS — K7030 Alcoholic cirrhosis of liver without ascites: Secondary | ICD-10-CM

## 2014-06-30 DIAGNOSIS — F1020 Alcohol dependence, uncomplicated: Secondary | ICD-10-CM

## 2014-06-30 DIAGNOSIS — K852 Alcohol induced acute pancreatitis without necrosis or infection: Secondary | ICD-10-CM

## 2014-06-30 DIAGNOSIS — Z7901 Long term (current) use of anticoagulants: Secondary | ICD-10-CM

## 2014-06-30 NOTE — Patient Instructions (Signed)
Please call the office after the first of the year to schedule your colonoscopy.

## 2014-06-30 NOTE — Progress Notes (Signed)
HISTORY OF PRESENT ILLNESS:  Nicholas Bird is a 73 y.o. male with multiple significant medical problems including hypertension, hyperlipidemia, atrial fibrillation on chronic anticoagulation, history of TIA, symptomatic cholelithiasis and choledocholithiasis status post cholecystectomy and ERCP with common duct stone extraction, chronic alcoholism with a history of alcohol-related pancreatitis February 2014, and compensated alcoholic cirrhosis. He also has a history of adenomatous colon polyp found on incomplete colonoscopy November 2009. He has been a long term GI patient of Dr. Verl Blalock until his retirement earlier this year. Patient is set up today regarding management of his chronic GI problems. As well, need for complete surveillance colonoscopy. His last examination was incomplete due to the presence of abdominal hernia. He is now status post repair. He was seen by his cardiologist earlier this year. At that time on xeralto but subsequently switched to Eliquis. The patient did undergo MR CP and MRI of the abdomen in February 2014 after his bout of pancreatitis. Pancreatitis without complication. Diminutive tail cyst noted. Evidence for cirrhosis. Prior cholecystectomy without choledocholithiasis. No recurrent bouts of pancreatitis. Patient denies any active GI symptoms at this time. His primary care physician is Dr. Loanne Drilling. His cardiologist Dr. Caryl Comes  REVIEW OF SYSTEMS:  All non-GI ROS negative except for hematuria  Past Medical History  Diagnosis Date  . Alcoholic cirrhosis of liver   . Personal history of colonic polyps 07/12/2008    TUBULAR ADENOMA  . Gout     per pt 12-09-2013  gout is stable  . Hyperlipemia   . Hypertension   . Alcohol dependence   . Atrial fibrillation, permanent     CARIOLOGIST-  DR Caryl Comes  . History of TIA (transient ischemic attack)     04/2009--  no residual  . History of pancreatitis     10/2012--  resolved   . Anticoagulant long-term use   . Elevated  LFTs     CHRONIC  . Bladder diverticulum   . Hematuria   . BPH (benign prostatic hypertrophy)   . ED (erectile dysfunction) of organic origin   . Rosacea   . Gall stones     Past Surgical History  Procedure Laterality Date  . Inguinal hernia repair Left 01-14-2001  . Laparoscopic cholecystectomy  10-21-2008    AND LIVER BX'S  . Laparoscopic inguinal hernia repair Bilateral 05-22-2009  . Cataract extraction w/ intraocular lens  implant, bilateral Bilateral 2008  . Cardiac catheterization  09-19-1998    false positive stress test---  normal cath and preserved lvf  . Cystoscopy with biopsy N/A 12/14/2013    Procedure: CYSTOSCOPY WITH BIOPSY;  Surgeon: Franchot Gallo, MD;  [facility];  Service: Urology;  Laterality: N/A;    Social History CEFERINO LANG  reports that he has never smoked. He has never used smokeless tobacco. He reports that he drinks about 8.4 ounces of alcohol per week. He reports that he does not use illicit drugs.  family history includes Lung cancer in his mother; Skin cancer in his sister.  No Known Allergies     PHYSICAL EXAMINATION: Vital signs: BP 118/60  Pulse 57  Ht 5\' 11"  (1.803 m)  Wt 193 lb 9.6 oz (87.816 kg)  BMI 27.01 kg/m2  SpO2 99%  Constitutional: generally well-appearing, no acute distress Psychiatric: alert and oriented x3, cooperative Eyes: extraocular movements intact, anicteric, conjunctiva pink Mouth: oral pharynx moist, no lesions Neck: supple no lymphadenopathy Cardiovascular: Irregularly irregular, no murmur Lungs: clear to auscultation bilaterally Abdomen: soft, nontender, nondistended, no  obvious ascites, no peritoneal signs, normal bowel sounds, no organomegaly. Surgical incisions well-healed. Rectal: Deferred until colonoscopy Extremities: no lower extremity edema bilaterally Skin: Hypervascularity of the Maller regions of the face; no lesions on visible extremities Neuro: No focal deficits. No  asterixis.    ASSESSMENT:  #1. History of colonic adenoma. Incomplete colonoscopy 2009. Due for surveillance. #2. History of abdominal hernia status post repair #3. Alcoholic cirrhosis of the liver. Compensated #4. History of alcoholic pancreatitis February 2014. Resolved #5. Alcoholism. Ongoing #6. Multiple significant medical problems including chronic atrial fibrillation for which he is on anticoagulation therapy   PLAN:  #1. We discussed surveillance colonoscopy. The patient is high risk due to the need to manipulate his anticoagulation peri-procedurally.The nature of the procedure, as well as the risks, benefits, and alternatives were carefully and thoroughly reviewed with the patient. Ample time for discussion and questions allowed. The patient understood, was satisfied, and agreed to proceed. The patient like to wait until April to schedule his exam. He can be seen by the previsit nurse at that time. Management of his current anticoagulation at that time will be directed by his cardiologist. #2. Discontinue all alcohol strictly advised #3. Resume general medical care with Dr. Loanne Drilling

## 2014-07-12 ENCOUNTER — Other Ambulatory Visit: Payer: Self-pay

## 2014-07-12 MED ORDER — ALLOPURINOL 300 MG PO TABS: 300.0000 mg | ORAL_TABLET | Freq: Every day | ORAL | Status: DC

## 2014-08-09 ENCOUNTER — Other Ambulatory Visit: Payer: Self-pay

## 2014-08-09 MED ORDER — ALLOPURINOL 300 MG PO TABS: 300.0000 mg | ORAL_TABLET | Freq: Every day | ORAL | Status: DC

## 2014-08-20 ENCOUNTER — Telehealth: Payer: Self-pay

## 2014-08-20 NOTE — Telephone Encounter (Signed)
Requested call back from pt to discuss Flu shot.

## 2014-09-09 ENCOUNTER — Telehealth: Payer: Self-pay | Admitting: Endocrinology

## 2014-09-09 NOTE — Telephone Encounter (Signed)
Patient states he was advised by his pharmacy to schedule an appointment to receive a refill    Please advise   Thank you

## 2014-09-09 NOTE — Telephone Encounter (Signed)
Pt needs a refill of his Allopurinol. Pt was last seen 07/2013.  Please advise if ok to refill.  Thanks!

## 2014-09-09 NOTE — Telephone Encounter (Signed)
Please refill x 1 cpx appt is due

## 2014-09-10 ENCOUNTER — Other Ambulatory Visit: Payer: Self-pay | Admitting: *Deleted

## 2014-09-10 MED ORDER — ALLOPURINOL 300 MG PO TABS: 300.0000 mg | ORAL_TABLET | Freq: Every day | ORAL | Status: DC

## 2014-09-10 MED ORDER — APIXABAN 5 MG PO TABS: 5.0000 mg | ORAL_TABLET | Freq: Two times a day (BID) | ORAL | Status: DC

## 2014-09-10 NOTE — Telephone Encounter (Signed)
Rx sent 

## 2014-09-14 ENCOUNTER — Ambulatory Visit (INDEPENDENT_AMBULATORY_CARE_PROVIDER_SITE_OTHER): Payer: Medicare HMO | Admitting: Endocrinology

## 2014-09-14 ENCOUNTER — Encounter: Payer: Self-pay | Admitting: Endocrinology

## 2014-09-14 VITALS — BP 132/76 | HR 46 | Temp 98.6°F | Ht 71.0 in | Wt 193.0 lb

## 2014-09-14 DIAGNOSIS — E785 Hyperlipidemia, unspecified: Secondary | ICD-10-CM

## 2014-09-14 DIAGNOSIS — Z0000 Encounter for general adult medical examination without abnormal findings: Secondary | ICD-10-CM

## 2014-09-14 DIAGNOSIS — K7030 Alcoholic cirrhosis of liver without ascites: Secondary | ICD-10-CM

## 2014-09-14 DIAGNOSIS — M1A9XX0 Chronic gout, unspecified, without tophus (tophi): Secondary | ICD-10-CM

## 2014-09-14 DIAGNOSIS — Z23 Encounter for immunization: Secondary | ICD-10-CM

## 2014-09-14 DIAGNOSIS — I10 Essential (primary) hypertension: Secondary | ICD-10-CM

## 2014-09-14 DIAGNOSIS — Z125 Encounter for screening for malignant neoplasm of prostate: Secondary | ICD-10-CM

## 2014-09-14 LAB — CBC WITH DIFFERENTIAL/PLATELET
BASOS PCT: 0.4 % (ref 0.0–3.0)
Basophils Absolute: 0 10*3/uL (ref 0.0–0.1)
Eosinophils Absolute: 0.2 10*3/uL (ref 0.0–0.7)
Eosinophils Relative: 2.6 % (ref 0.0–5.0)
HEMATOCRIT: 45.2 % (ref 39.0–52.0)
Hemoglobin: 14.8 g/dL (ref 13.0–17.0)
LYMPHS ABS: 1.3 10*3/uL (ref 0.7–4.0)
Lymphocytes Relative: 19.6 % (ref 12.0–46.0)
MCHC: 32.7 g/dL (ref 30.0–36.0)
MCV: 102 fl — ABNORMAL HIGH (ref 78.0–100.0)
MONO ABS: 0.9 10*3/uL (ref 0.1–1.0)
MONOS PCT: 13.1 % — AB (ref 3.0–12.0)
NEUTROS PCT: 64.3 % (ref 43.0–77.0)
Neutro Abs: 4.3 10*3/uL (ref 1.4–7.7)
PLATELETS: 212 10*3/uL (ref 150.0–400.0)
RBC: 4.43 Mil/uL (ref 4.22–5.81)
RDW: 15.9 % — ABNORMAL HIGH (ref 11.5–15.5)
WBC: 6.7 10*3/uL (ref 4.0–10.5)

## 2014-09-14 LAB — BASIC METABOLIC PANEL
BUN: 14 mg/dL (ref 6–23)
CHLORIDE: 99 meq/L (ref 96–112)
CO2: 33 meq/L — AB (ref 19–32)
Calcium: 9.9 mg/dL (ref 8.4–10.5)
Creatinine, Ser: 0.9 mg/dL (ref 0.4–1.5)
GFR: 92.68 mL/min (ref >60.00)
GLUCOSE: 99 mg/dL (ref 70–99)
POTASSIUM: 4.3 meq/L (ref 3.5–5.1)
Sodium: 134 mEq/L — ABNORMAL LOW (ref 135–145)

## 2014-09-14 LAB — LIPID PANEL
CHOL/HDL RATIO: 4
Cholesterol: 220 mg/dL — ABNORMAL HIGH (ref 0–200)
HDL: 51.1 mg/dL (ref >39.00)
LDL CALC: 139 mg/dL — AB (ref 0–99)
NonHDL: 168.9
TRIGLYCERIDES: 149 mg/dL (ref 0.0–149.0)
VLDL: 29.8 mg/dL (ref 0.0–40.0)

## 2014-09-14 LAB — HEPATIC FUNCTION PANEL
ALK PHOS: 53 U/L (ref 39–117)
ALT: 22 U/L (ref 0–53)
AST: 26 U/L (ref 0–37)
Albumin: 4.7 g/dL (ref 3.5–5.2)
BILIRUBIN DIRECT: 0.3 mg/dL (ref 0.0–0.3)
TOTAL PROTEIN: 7.5 g/dL (ref 6.0–8.3)
Total Bilirubin: 1.6 mg/dL — ABNORMAL HIGH (ref 0.2–1.2)

## 2014-09-14 LAB — URINALYSIS, ROUTINE W REFLEX MICROSCOPIC
Bilirubin Urine: NEGATIVE
KETONES UR: NEGATIVE
Leukocytes, UA: NEGATIVE
Nitrite: NEGATIVE
Specific Gravity, Urine: 1.01 (ref 1.000–1.030)
TOTAL PROTEIN, URINE-UPE24: NEGATIVE
URINE GLUCOSE: NEGATIVE
UROBILINOGEN UA: 0.2 (ref 0.0–1.0)
pH: 7 (ref 5.0–8.0)

## 2014-09-14 LAB — TSH: TSH: 1.37 u[IU]/mL (ref 0.35–4.50)

## 2014-09-14 LAB — PSA: PSA: 1.58 ng/mL (ref 0.10–4.00)

## 2014-09-14 LAB — URIC ACID: Uric Acid, Serum: 3.8 mg/dL — ABNORMAL LOW (ref 4.0–7.8)

## 2014-09-14 NOTE — Patient Instructions (Addendum)
please consider these measures for your health:  minimize alcohol.  do not use tobacco products.  have a colonoscopy at least every 10 years from age 74.  keep firearms safely stored.  always use seat belts.  have working smoke alarms in your home.  see an eye doctor and dentist regularly.  never drive under the influence of alcohol or drugs (including prescription drugs).  those with fair skin should take precautions against the sun. please let me know what your wishes would be, if artificial life support measures should become necessary.  it is critically important to prevent falling down (keep floor areas well-lit, dry, and free of loose objects.  If you have a cane, walker, or wheelchair, you should use it, even for short trips around the house.  Also, try not to rush).   good diet and exercise habits significanly improve your health.  please let me know if you wish to be referred to a dietician.  you should see an eye doctor and dentist every year.  It is very important to get all recommended vaccinations.   blood tests are being requested for you today.  We'll let you know about the results.   Please return in 1 year.

## 2014-09-14 NOTE — Progress Notes (Signed)
Subjective:    Patient ID: Nicholas Bird, male    DOB: August 19, 1941, 74 y.o.   MRN: [id number]  HPI Pt is here for regular wellness examination, and is feeling pretty well in general, and says chronic med probs are stable, except as noted below Bradycardia was also noted at cardiol last year.   Past Medical History  Diagnosis Date  . Alcoholic cirrhosis of liver   . Personal history of colonic polyps 07/12/2008    TUBULAR ADENOMA  . Gout     per pt 12-09-2013  gout is stable  . Hyperlipemia   . Hypertension   . Alcohol dependence   . Atrial fibrillation, permanent     CARIOLOGIST-  DR Caryl Comes  . History of TIA (transient ischemic attack)     04/2009--  no residual  . History of pancreatitis     10/2012--  resolved   . Anticoagulant long-term use   . Elevated LFTs     CHRONIC  . Bladder diverticulum   . Hematuria   . BPH (benign prostatic hypertrophy)   . ED (erectile dysfunction) of organic origin   . Rosacea   . Gall stones     Past Surgical History  Procedure Laterality Date  . Inguinal hernia repair Left 01-14-2001  . Laparoscopic cholecystectomy  10-21-2008    AND LIVER BX'S  . Laparoscopic inguinal hernia repair Bilateral 05-22-2009  . Cataract extraction w/ intraocular lens  implant, bilateral Bilateral 2008  . Cardiac catheterization  09-19-1998    false positive stress test---  normal cath and preserved lvf  . Cystoscopy with biopsy N/A 12/14/2013    Procedure: CYSTOSCOPY WITH BIOPSY;  Surgeon: Franchot Gallo, MD;  Location: [facility];  Service: Urology;  Laterality: N/A;    History   Social History  . Marital Status: Married    Spouse Name: N/A    Number of Children: 2  . Years of Education: N/A   Occupational History  . retired   .     Social History Main Topics  . Smoking status: Never Smoker   . Smokeless tobacco: Never Used  . Alcohol Use: 8.4 oz/week    14 Shots of liquor per week     Comment: 3 oz. per day  . Drug Use: No    . Sexual Activity: Not on file   Other Topics Concern  . Not on file   Social History Narrative    Current Outpatient Prescriptions on File Prior to Visit  Medication Sig Dispense Refill  . allopurinol (ZYLOPRIM) 300 MG tablet Take 1 tablet (300 mg total) by mouth daily. APPOINTMENT NEEDED FOR FURTHER REFILLS. 30 tablet 0  . apixaban (ELIQUIS) 5 MG TABS tablet Take 1 tablet (5 mg total) by mouth 2 (two) times daily. 60 tablet 5  . FINASTERIDE PO Take 5 mg/day by mouth.    . metoprolol tartrate (LOPRESSOR) 25 MG tablet Take 1 tablet (25 mg total) by mouth 2 (two) times daily. 60 tablet 10  . Multiple Vitamin (MULTIVITAMIN) capsule Take 1 capsule by mouth daily.       No current facility-administered medications on file prior to visit.    No Known Allergies  Family History  Problem Relation Age of Onset  . Lung cancer Mother   . Skin cancer Sister     BP 132/76 mmHg  Pulse 46  Temp(Src) 98.6 F (37 C) (Oral)  Ht 5\' 11"  (1.803 m)  Wt 193 lb (87.544 kg)  BMI 26.93  kg/m2  SpO2 98%  Review of Systems  Constitutional: Negative for fever and unexpected weight change.  HENT: Negative for dental problem and hearing loss.   Eyes: Negative for photophobia and visual disturbance.  Respiratory: Negative for shortness of breath.   Cardiovascular: Negative for chest pain.  Gastrointestinal: Negative for anal bleeding.  Endocrine: Positive for cold intolerance.  Genitourinary: Negative for difficulty urinating.  Musculoskeletal: Negative for back pain.  Skin: Negative for rash.  Allergic/Immunologic: Negative for environmental allergies.  Neurological: Negative for syncope and numbness.  Hematological: Bruises/bleeds easily.  Psychiatric/Behavioral: Negative for dysphoric mood.       Objective:   Physical Exam VS: see vs page GEN: no distress HEAD: head: no deformity eyes: no periorbital swelling, no proptosis external nose and ears are normal mouth: no lesion  seen. NECK: supple, thyroid is not enlarged CHEST WALL: no deformity. LUNGS: clear to auscultation. BREASTS:  No gynecomastia. CV: reg rate and rhythm, no murmur.   ABD: abdomen is soft, nontender.  no hepatosplenomegaly.  not distended.  no hernia GENITALIA/RECTAL/PROSTATE: sees urology.   MUSCULOSKELETAL: muscle bulk and strength are grossly normal.  no obvious joint swelling.  gait is normal and steady EXTEMITIES: no deformity.  no ulcer on the feet.  feet are of normal color and temp.  no edema.  There is bilateral onychomycosis and varicosities.   PULSES: dorsalis pedis intact bilat.  no carotid bruit NEURO:  cn 2-12 grossly intact.   readily moves all 4's.  sensation is intact to touch on the feet SKIN:  Normal texture and temperature.  No rash or suspicious lesion is visible.  Severely dry skin, especially on the feet.  NODES:  None palpable at the neck PSYCH: alert, well-oriented.  Does not appear anxious nor depressed.       Assessment & Plan:  Wellness visit today, with problems stable, except as noted.    Subjective:   Patient here for Medicare annual wellness visit and management of other chronic and acute problems.     Risk factors: advanced age    13 of Physicians Providing Medical Care to Patient:  See "snapshot"   Activities of Daily Living: In your present state of health, do you have any difficulty performing the following activities?:  Preparing food and eating?: No  Bathing yourself: No  Getting dressed: No  Using the toilet:No  Moving around from place to place: No  In the past year have you fallen or had a near fall?: No    Home Safety: Has smoke detector and wears seat belts. Firearms are safely stored. No excess sun exposure.  Diet and Exercise  Current exercise habits: pt says good Dietary issues discussed: pt reports a healthy diet   Depression Screen  Q1: Over the past two weeks, have you felt down, depressed or hopeless? no  Q2: Over the  past two weeks, have you felt little interest or pleasure in doing things? no   The following portions of the patient's history were reviewed and updated as appropriate: allergies, current medications, past family history, past medical history, past social history, past surgical history and problem list.   Review of Systems  Denies hearing loss, and visual loss Objective:   Vision:  Sees opthalmologist Hearing: grossly normal Body mass index:  See vs page Msk: pt easily and quickly performs "get-up-and-go" from a sitting position Cognitive Impairment Assessment: cognition, memory and judgment appear normal.  remembers 3/3 at 5 minutes.  excellent recall.  can easily read and  write a sentence.  alert and oriented x 3.    Assessment:   Medicare wellness utd on preventive parameters    Plan:   During the course of the visit the patient was educated and counseled about appropriate screening and preventive services including:        Fall prevention Diabetes screening  Nutrition counseling   Vaccines / LABS Zostavax today  PSA  Patient Instructions (the written plan) was given to the patient.

## 2014-09-28 ENCOUNTER — Encounter: Payer: Self-pay | Admitting: Physician Assistant

## 2014-10-12 ENCOUNTER — Other Ambulatory Visit: Payer: Self-pay

## 2014-10-12 MED ORDER — ALLOPURINOL 300 MG PO TABS: 300.0000 mg | ORAL_TABLET | Freq: Every day | ORAL | Status: DC

## 2014-10-19 ENCOUNTER — Ambulatory Visit (INDEPENDENT_AMBULATORY_CARE_PROVIDER_SITE_OTHER): Payer: Medicare HMO | Admitting: Physician Assistant

## 2014-10-19 ENCOUNTER — Encounter: Payer: Self-pay | Admitting: Physician Assistant

## 2014-10-19 ENCOUNTER — Other Ambulatory Visit (INDEPENDENT_AMBULATORY_CARE_PROVIDER_SITE_OTHER): Payer: Medicare HMO

## 2014-10-19 ENCOUNTER — Telehealth: Payer: Self-pay | Admitting: *Deleted

## 2014-10-19 VITALS — BP 124/80 | HR 50 | Ht 71.5 in | Wt 196.8 lb

## 2014-10-19 DIAGNOSIS — Z5181 Encounter for therapeutic drug level monitoring: Secondary | ICD-10-CM

## 2014-10-19 DIAGNOSIS — Z7901 Long term (current) use of anticoagulants: Secondary | ICD-10-CM

## 2014-10-19 DIAGNOSIS — Z86010 Personal history of colonic polyps: Secondary | ICD-10-CM

## 2014-10-19 DIAGNOSIS — K7030 Alcoholic cirrhosis of liver without ascites: Secondary | ICD-10-CM

## 2014-10-19 LAB — PROTIME-INR
INR: 1.3 ratio — AB (ref 0.8–1.0)
PROTHROMBIN TIME: 14.3 s — AB (ref 9.6–13.1)

## 2014-10-19 MED ORDER — MOVIPREP 100 G PO SOLR: 1.0000 | ORAL | Status: DC

## 2014-10-19 NOTE — Progress Notes (Signed)
Patient ID: Nicholas Bird, male   DOB: 03/28/1941, 74 y.o.   MRN: [id number]   Subjective:    Patient ID: Nicholas Bird, male    DOB: 20-May-1941, 74 y.o.   MRN: [id number]  HPI Nicholas Bird is a pleasant 74 year old white male known to Dr. Henrene Pastor. He had been seen in the office in October 2015 and at that time established with Dr. Henrene Pastor after Dr. Buel Ream retirement. He has multiple medical issues though stable. These include hypertension, and hyperlipidemia ,chronic atrial fibrillation for which he is on Eliquis, prior history of a TIA. He status post cholecystectomy and common bile duct stone extraction 2010. He also has a history of chronic alcoholism and a prior episode of alcohol-related pancreatitis in February 2014. He has been felt to have compensated cirrhosis. He also has history of adenomatous colon polyps with incomplete colonoscopy in 2009. He is due for follow-up colonoscopy and comes in today to discuss scheduling colonoscopy. He has some up calming trips and would like to schedule this in April. He has no GI complaints today, specifically no problems with abdominal pain and changes in bowel habits melena or hematochezia. He says he has been having some intermittent hematuria and is seeing his urologist tomorrow. Patient continues to drink on a daily basis but says he's been drinking two  mini  bottles each evening which is much less than he used to drink in the past. He had stopped drinking after the episode of pancreatitis completely but has gone back to daily use, though less.  Review of Systems Pertinent positive and negative review of systems were noted in the above HPI section.  All other review of systems was otherwise negative.  Outpatient Encounter Prescriptions as of 10/19/2014  Medication Sig  . allopurinol (ZYLOPRIM) 300 MG tablet Take 1 tablet (300 mg total) by mouth daily.  Marland Kitchen apixaban (ELIQUIS) 5 MG TABS tablet Take 1 tablet (5 mg total) by mouth 2 (two) times daily.  Marland Kitchen  FINASTERIDE PO Take 5 mg/day by mouth.  . metoprolol tartrate (LOPRESSOR) 25 MG tablet Take 1 tablet (25 mg total) by mouth 2 (two) times daily.  . Multiple Vitamin (MULTIVITAMIN) capsule Take 1 capsule by mouth daily.    Marland Kitchen MOVIPREP 100 G SOLR Take 1 kit (200 g total) by mouth as directed.   No Known Allergies Patient Active Problem List   Diagnosis Date Noted  . Wellness examination 09/14/2014  . Hematuria 07/23/2013  . Unspecified hereditary and idiopathic peripheral neuropathy 07/23/2013  . Screening for prostate cancer 07/22/2013  . Numbness 07/22/2013  . Epistaxis, recurrent 11/20/2012  . Erectile dysfunction 11/20/2012  . Lightheaded 11/20/2012  . Encounter for long-term (current) use of other medications 07/09/2012  . TIA 08/09/2009  . GERD 12/07/2008  . BENIGN PROSTATIC HYPERTROPHY, HX OF 11/04/2008  . ALCOHOLIC CIRRHOSIS OF LIVER 10/21/2008  . CHOLEDOCHOLITHIASIS 10/21/2008  . FEMORAL HERNIA 10/07/2008  . HEMORRHOIDS 10/06/2008  . Personal history of colonic polyps 10/06/2008  . ECZEMA 06/03/2008  . PNEUMONIA 08/09/2007  . Dyslipidemia 08/06/2007  . ALCOHOL ABUSE 08/06/2007  . Essential hypertension 08/06/2007  . Gout 05/14/2007  . ATRIAL FIBRILLATION 05/14/2007   History   Social History  . Marital Status: Married    Spouse Name: N/A  . Number of Children: 2  . Years of Education: N/A   Occupational History  . retired   .     Social History Main Topics  . Smoking status: Never Smoker   . Smokeless tobacco: Never  Used  . Alcohol Use: 8.4 oz/week    14 Shots of liquor per week     Comment: 3 oz. per day  . Drug Use: No  . Sexual Activity: Not on file   Other Topics Concern  . Not on file   Social History Narrative    Mr. Schwager family history includes Lung cancer in his mother; Skin cancer in his sister.      Objective:    Filed Vitals:   10/19/14 0832  BP: 124/80  Pulse: 50    Physical Exam   Well-developed elderly white male in no  acute distress, pleasant- blood pressure 124/80 pulse 50 height 5 foot 11 weight 196. HEENT; nontraumatic normocephalic EOMI PERRLA sclera anicteric, Supple; no JVD, Cardiovascular irregular rate and rhythm with S1-S2 no murmur rub or gallop, Pulmonary: clear bilaterally, Abdomen :soft ,nontender, nondistended bowel sounds are present there is no mass or palpable hepatosplenomegaly ,no fluid wave , Rectal; not done, Extremities; no clubbing cyanosis or edema skin warm and dry, Psych ;mood and affect appropriate       Assessment & Plan:   #1 74  yo male with hx of adenomatous colon polyps - overdue for follow up colonoscopy -last colon 2009-incomplete(has since had hernia repair) #2 Chronic anticoagulation- on Eliquis #3 chronic anticoagulation #4 chronic ETOH use  #5 Compensated cirrhosis #6  hx of ETOH induced pancreatitis #7 s/p cholecystectomy and CBD stone extraction #8 HTN #9 s/p femoral hernia repair  Plan; Will schedule for colonoscopy with Dr. Scarlette Shorts. Procedure discussed in detail with the patient and he is agreeable to proceed. Prior note per Dr. Caryl Comes his cardiologist indicates patient is fine to be off of Eliquis for 36 hours prior to his procedure. We will communicate again with Dr. Caryl Comes Will check pro-time and alpha-fetoprotein levels as part of cirrhosis management Discussed EtOH abstinence again with the patient Will also schedule for EGD to screen for varices especially in a patient who is being chronically anticoagulated Patient will have follow-up visit with Dr. Henrene Pastor in about 8 weeks regarding cirrhosis.   Amy S Esterwood PA-C 10/19/2014

## 2014-10-19 NOTE — Patient Instructions (Signed)
Patient aware of lab results will follow the suggestions outlined in the previous office note. You have been scheduled for an endoscopy and colonoscopy. Please follow the written instructions given to you at your visit today. Please pick up your prep at the pharmacy within the next 1-3 days. If you use inhalers (even only as needed), please bring them with you on the day of your procedure.  Make an appointment to see Dr. Henrene Pastor in 6-8 weeks.

## 2014-10-19 NOTE — Progress Notes (Signed)
History and physical reviewed. Agree with assessment and plans as outlined

## 2014-10-19 NOTE — Telephone Encounter (Signed)
  10/19/2014   RE: Nicholas Bird DOB: 1941-03-11 MRN: [id number]   Dear Dr. Virl Axe,    We have scheduled the above patient for an Endoscopy and Colonoscopy. Our records show that he is on anticoagulation therapy.   Please advise as to how long the patient may come off his therapy of Eliquis prior to the procedure, which is scheduled for 12-15-2014.  Please fax back/ or route the completed form to  [place] at [phone].   Sincerely,    Amy Esterwood PA-C

## 2014-10-20 LAB — AFP TUMOR MARKER: AFP-Tumor Marker: 2.3 ng/mL (ref <6.1)

## 2014-10-20 NOTE — Telephone Encounter (Signed)
He has had a prior stroke  I would have him take his PM dose and do procedure the following afternoon ( ie 24 hrs off drug)  If the colonscopy is NOT necessary, I would not proceed with it

## 2014-10-21 NOTE — Telephone Encounter (Signed)
Per Nicoletta Ba PA-C  I spoke to  The patient and advised him to not take the Eliquiis on 12-14-2014 .  Then he can take it after the procedure on 12-15-2014, the evening dose.  Pt verbalized understanding the instructions.

## 2014-10-28 ENCOUNTER — Telehealth: Payer: Self-pay | Admitting: Internal Medicine

## 2014-10-28 NOTE — Telephone Encounter (Signed)
New Msg       Pt c/o medication issue: 1. Name of Medication: Eliquis  2. How are you currently taking this medication (dosage and times per day)? 5 mg 2 times a day 3. Are you having a reaction (difficulty breathing--STAT)?  no 4. What is your medication issue? Co-payment is very high for prescription.    Belinda Pharmacist from [employer], requesting for pt to have a new prescription.  States Eliquis cost is too high and other options should be discussed. Please contact pt at home # [phone] to discuss this per [facility].

## 2014-11-01 NOTE — Telephone Encounter (Signed)
Informed patient Eliquis moved from tier 4 to tier 3.   Approval #  [id number] Good through 09/03/2015 Patient made aware.

## 2014-11-26 ENCOUNTER — Ambulatory Visit: Payer: Medicare HMO | Admitting: Internal Medicine

## 2014-12-14 ENCOUNTER — Other Ambulatory Visit: Payer: Self-pay | Admitting: Dermatology

## 2014-12-15 ENCOUNTER — Encounter: Payer: Self-pay | Admitting: Internal Medicine

## 2014-12-15 ENCOUNTER — Encounter: Payer: Medicare HMO | Admitting: Internal Medicine

## 2014-12-15 ENCOUNTER — Ambulatory Visit (AMBULATORY_SURGERY_CENTER): Payer: Medicare HMO | Admitting: Internal Medicine

## 2014-12-15 VITALS — BP 135/79 | HR 67 | Temp 97.2°F | Resp 93 | Ht 71.0 in | Wt 196.0 lb

## 2014-12-15 DIAGNOSIS — D122 Benign neoplasm of ascending colon: Secondary | ICD-10-CM | POA: Diagnosis not present

## 2014-12-15 DIAGNOSIS — D120 Benign neoplasm of cecum: Secondary | ICD-10-CM

## 2014-12-15 DIAGNOSIS — Z86010 Personal history of colonic polyps: Secondary | ICD-10-CM | POA: Diagnosis not present

## 2014-12-15 DIAGNOSIS — D125 Benign neoplasm of sigmoid colon: Secondary | ICD-10-CM

## 2014-12-15 DIAGNOSIS — K709 Alcoholic liver disease, unspecified: Secondary | ICD-10-CM

## 2014-12-15 DIAGNOSIS — K7030 Alcoholic cirrhosis of liver without ascites: Secondary | ICD-10-CM | POA: Diagnosis not present

## 2014-12-15 DIAGNOSIS — K7469 Other cirrhosis of liver: Secondary | ICD-10-CM

## 2014-12-15 DIAGNOSIS — Z7901 Long term (current) use of anticoagulants: Secondary | ICD-10-CM

## 2014-12-15 NOTE — Progress Notes (Signed)
Called to room to assist during endoscopic procedure.  Patient ID and intended procedure confirmed with present staff. Received instructions for my participation in the procedure from the performing physician.  

## 2014-12-15 NOTE — Patient Instructions (Signed)

## 2014-12-15 NOTE — Progress Notes (Signed)
Patient awakening,vss,report to rn 

## 2014-12-15 NOTE — Op Note (Signed)
[place]  [address]. [place], [id number]   ENDOSCOPY PROCEDURE REPORT  PATIENT: Nicholas Bird, Nicholas Bird  MR#: [id number] BIRTHDATE: [birth year] , 74  yrs. old GENDER: male ENDOSCOPIST: Eustace Quail, MD REFERRED BY:  .  Self / Office PROCEDURE DATE:  12/15/2014 PROCEDURE:  EGD, screening ASA CLASS:     Class III INDICATIONS:  screening for varices. MEDICATIONS: Monitored anesthesia care and Propofol 100 mg IV TOPICAL ANESTHETIC: none  DESCRIPTION OF PROCEDURE: After the risks benefits and alternatives of the procedure were thoroughly explained, informed consent was obtained.  The LB [id number] endoscope was introduced through the mouth and advanced to the second portion of the duodenum , Without limitations.  The instrument was slowly withdrawn as the mucosa was fully examined.      EXAM: The esophagus and gastroesophageal junction were completely normal in appearance.  The stomach was entered and closely examined.The antrum, angularis, and lesser curvature were well visualized, including a retroflexed view of the cardia and fundus. The stomach wall was normally distensable.  The scope passed easily through the pylorus into the duodenum.  Retroflexed views revealed no abnormalities.     The scope was then withdrawn from the patient and the procedure completed.  COMPLICATIONS: There were no immediate complications.  ENDOSCOPIC IMPRESSION: 1. Normal EGD. No varices  RECOMMENDATIONS: 1. Repeat upper endoscopy in 3 years  REPEAT EXAM:  eSigned:  Eustace Quail, MD 12/15/2014 12:24 PM    IE:PPIR [name], MD and The Patient

## 2014-12-15 NOTE — Op Note (Signed)
[place]  [address]. [place], [id number]   COLONOSCOPY PROCEDURE REPORT  PATIENT: Nicholas, Bird  MR#: [id number] BIRTHDATE: 1941/03/14 , 74  yrs. old GENDER: male ENDOSCOPIST: Eustace Quail, MD REFERRED KC:[id number] Program Recall PROCEDURE DATE:  12/15/2014 PROCEDURE:   Colonoscopy, surveillance and Colonoscopy with snare polypectomy x 3 First Screening Colonoscopy - Avg.  risk and is 50 yrs.  old or older - No.  Prior Negative Screening - Now for repeat screening. N/A  History of Adenoma - Now for follow-up colonoscopy & has been > or = to 3 yrs.  Yes hx of adenoma.  Has been 3 or more years since last colonoscopy. ASA CLASS:   Class III INDICATIONS:Surveillance due to prior colonic neoplasia and PH Colon Adenoma.  . Prior exam 2009 incomplete followed by barium enema. Small adenoma removed MEDICATIONS: Monitored anesthesia care and Propofol 200 mg IV  DESCRIPTION OF PROCEDURE:   After the risks benefits and alternatives of the procedure were thoroughly explained, informed consent was obtained.  The digital rectal exam revealed no abnormalities of the rectum.   The LB [id number]  endoscope was introduced through the anus and advanced to the cecum, which was identified by both the appendix and ileocecal valve. No adverse events experienced.   The quality of the prep was excellent. (MoviPrep was used)  The instrument was then slowly withdrawn as the colon was fully examined.  COLON FINDINGS: Three polyps, 3-21mm in size, were found in the sigmoid, ascending colon, and at the cecum.  A polypectomy was performed with a cold snare.  The resection was complete, the polyp tissue was completely retrieved and sent to histology.   There was moderate diverticulosis in the sigmoid colon.   The examination was otherwise normal.  Retroflexed views revealed internal hemorrhoids. The time to cecum = 2.8 Withdrawal time = 10.5   The scope was withdrawn and  the procedure completed. COMPLICATIONS: There were no immediate complications.  ENDOSCOPIC IMPRESSION: 1.   Three polyps were found in the colon, and cecum; polypectomy was performed with a cold snare 2.   Moderate diverticulosis was noted in the sigmoid colon 3.   The examination was otherwise normal  RECOMMENDATIONS: 1.  Follow up colonoscopy in 5 years 2.  Upper endoscopy today (please see report) 3.  Resume Eliquis today  eSigned:  Eustace Quail, MD 12/15/2014 12:12 PM   cc: Donavan Foil, MD and The Patient

## 2014-12-16 ENCOUNTER — Telehealth: Payer: Self-pay | Admitting: *Deleted

## 2014-12-16 NOTE — Telephone Encounter (Signed)
  Follow up Call-  Call back number 12/15/2014  Post procedure Call Back phone  # [phone]  Permission to leave phone message Yes     Patient questions:  Do you have a fever, pain , or abdominal swelling? No. Pain Score  0 *  Have you tolerated food without any problems? Yes.    Have you been able to return to your normal activities? Yes.    Do you have any questions about your discharge instructions: Diet   No. Medications  No. Follow up visit  No.  Do you have questions or concerns about your Care? No.  Actions: * If pain score is 4 or above: No action needed, pain <4.

## 2014-12-23 ENCOUNTER — Ambulatory Visit: Payer: Medicare HMO | Admitting: Internal Medicine

## 2014-12-23 ENCOUNTER — Encounter: Payer: Self-pay | Admitting: Internal Medicine

## 2015-01-01 ENCOUNTER — Ambulatory Visit (INDEPENDENT_AMBULATORY_CARE_PROVIDER_SITE_OTHER): Payer: Medicare HMO | Admitting: Emergency Medicine

## 2015-01-01 VITALS — BP 124/78 | HR 73 | Temp 98.3°F | Resp 18 | Ht 71.5 in | Wt 199.0 lb

## 2015-01-01 DIAGNOSIS — C439 Malignant melanoma of skin, unspecified: Secondary | ICD-10-CM | POA: Diagnosis not present

## 2015-01-01 DIAGNOSIS — L7621 Postprocedural hemorrhage and hematoma of skin and subcutaneous tissue following a dermatologic procedure: Secondary | ICD-10-CM | POA: Diagnosis not present

## 2015-01-01 DIAGNOSIS — C434 Malignant melanoma of scalp and neck: Secondary | ICD-10-CM | POA: Insufficient documentation

## 2015-01-01 NOTE — Progress Notes (Signed)
[facility] [address], [place] [address] [phone]  Date:  01/01/2015   Name:  Nicholas Bird   DOB:  [birth year]   MRN:  [id number]  PCP:  Renato Shin, MD    Chief Complaint: surgical bleeding issues   History of Present Illness:  Nicholas Bird is a 74 y.o. very pleasant male patient who presents with the following:  Underwent an excision of a malignant melanoma yesterday. The wound was not closed. He began to bleed in the middle of the night and soaked his dressings and pillow. Now has his dressing failure. No clotting abnormality by history but is on eliquis. Denies other complaint or health concern today.   Patient Active Problem List   Diagnosis Date Noted  . Malignant melanoma 01/01/2015  . Wellness examination 09/14/2014  . Hematuria 07/23/2013  . Unspecified hereditary and idiopathic peripheral neuropathy 07/23/2013  . Screening for prostate cancer 07/22/2013  . Numbness 07/22/2013  . Epistaxis, recurrent 11/20/2012  . Erectile dysfunction 11/20/2012  . Lightheaded 11/20/2012  . Encounter for long-term (current) use of other medications 07/09/2012  . TIA 08/09/2009  . GERD 12/07/2008  . BENIGN PROSTATIC HYPERTROPHY, HX OF 11/04/2008  . ALCOHOLIC CIRRHOSIS OF LIVER 10/21/2008  . CHOLEDOCHOLITHIASIS 10/21/2008  . FEMORAL HERNIA 10/07/2008  . HEMORRHOIDS 10/06/2008  . Personal history of colonic polyps 10/06/2008  . ECZEMA 06/03/2008  . PNEUMONIA 08/09/2007  . Dyslipidemia 08/06/2007  . ALCOHOL ABUSE 08/06/2007  . Essential hypertension 08/06/2007  . Gout 05/14/2007  . ATRIAL FIBRILLATION 05/14/2007    Past Medical History  Diagnosis Date  . Alcoholic cirrhosis of liver   . Personal history of colonic polyps 07/12/2008    TUBULAR ADENOMA  . Gout     per pt 12-09-2013  gout is stable  . Hyperlipemia   . Hypertension   . Alcohol dependence   . Atrial fibrillation, permanent     CARIOLOGIST-  DR Caryl Comes  . History of TIA  (transient ischemic attack)     04/2009--  no residual  . History of pancreatitis     10/2012--  resolved   . Anticoagulant long-term use   . Elevated LFTs     CHRONIC  . Bladder diverticulum   . Hematuria   . BPH (benign prostatic hypertrophy)   . ED (erectile dysfunction) of organic origin   . Rosacea   . Gall stones     Past Surgical History  Procedure Laterality Date  . Inguinal hernia repair Left 01-14-2001  . Laparoscopic cholecystectomy  10-21-2008    AND LIVER BX'S  . Laparoscopic inguinal hernia repair Bilateral 05-22-2009  . Cataract extraction w/ intraocular lens  implant, bilateral Bilateral 2008  . Cardiac catheterization  09-19-1998    false positive stress test---  normal cath and preserved lvf  . Cystoscopy with biopsy N/A 12/14/2013    Procedure: CYSTOSCOPY WITH BIOPSY;  Surgeon: Franchot Gallo, MD;  Location: [facility];  Service: Urology;  Laterality: N/A;    History  Substance Use Topics  . Smoking status: Never Smoker   . Smokeless tobacco: Never Used  . Alcohol Use: 8.4 oz/week    14 Shots of liquor per week     Comment: 3 oz. per day    Family History  Problem Relation Age of Onset  . Lung cancer Mother   . Skin cancer Sister   . Colon cancer Neg Hx     No Known Allergies  Medication list has  been reviewed and updated.  Current Outpatient Prescriptions on File Prior to Visit  Medication Sig Dispense Refill  . allopurinol (ZYLOPRIM) 300 MG tablet Take 1 tablet (300 mg total) by mouth daily. 30 tablet 5  . apixaban (ELIQUIS) 5 MG TABS tablet Take 1 tablet (5 mg total) by mouth 2 (two) times daily. 60 tablet 5  . FINASTERIDE PO Take 5 mg/day by mouth.    . metoprolol tartrate (LOPRESSOR) 25 MG tablet Take 1 tablet (25 mg total) by mouth 2 (two) times daily. 60 tablet 10  . Multiple Vitamin (MULTIVITAMIN) capsule Take 1 capsule by mouth daily.       No current facility-administered medications on file prior to visit.     Review of Systems:  Review of Systems  Constitutional: Negative for fever, chills and fatigue.  HENT: Negative for congestion, ear pain, hearing loss, postnasal drip, rhinorrhea and sinus pressure.   Eyes: Negative for discharge and redness.  Respiratory: Negative for cough, shortness of breath and wheezing.   Cardiovascular: Negative for chest pain and leg swelling.  Gastrointestinal: Negative for nausea, vomiting, abdominal pain, constipation and blood in stool.  Genitourinary: Negative for dysuria, urgency and frequency.  Musculoskeletal: Negative for neck stiffness.  Skin: Negative for rash.  Neurological: Negative for seizures, weakness and headaches.     Physical Examination: Filed Vitals:   01/01/15 0856  BP: 124/78  Pulse: 73  Temp: 98.3 F (36.8 C)  Resp: 18   Filed Vitals:   01/01/15 0856  Height: 5' 11.5" (1.816 m)  Weight: 199 lb (90.266 kg)   Body mass index is 27.37 kg/(m^2). Ideal Body Weight: Weight in (lb) to have BMI = 25: 181.4   GEN: WDWN, NAD, Non-toxic, Alert & Oriented x 3 HEENT: Atraumatic, Normocephalic.  Ears and Nose: No external deformity. EXTR: No clubbing/cyanosis/edema NEURO: Normal gait.  PSYCH: Normally interactive. Conversant. Not depressed or anxious appearing.  Calm demeanor.  Wound on scalp has clot.  No active bleeding.  Has 2.5 x 2.5 cm cutaneous defect to scalp  Assessment and Plan: Post operative bleeding. Dressing replaced Follow up with surgeon Monday and do not disturb dressing until seen   Signed Ellison Carwin, MD

## 2015-01-01 NOTE — Patient Instructions (Signed)

## 2015-01-21 ENCOUNTER — Ambulatory Visit: Payer: Medicare HMO | Admitting: Internal Medicine

## 2015-03-04 ENCOUNTER — Other Ambulatory Visit: Payer: Self-pay

## 2015-03-04 MED ORDER — APIXABAN 5 MG PO TABS: 5.0000 mg | ORAL_TABLET | Freq: Two times a day (BID) | ORAL | Status: DC

## 2015-03-04 NOTE — Telephone Encounter (Signed)
Per note 3.25.16 

## 2015-03-09 ENCOUNTER — Other Ambulatory Visit: Payer: Self-pay

## 2015-03-11 ENCOUNTER — Ambulatory Visit (INDEPENDENT_AMBULATORY_CARE_PROVIDER_SITE_OTHER): Payer: Medicare HMO | Admitting: Internal Medicine

## 2015-03-11 ENCOUNTER — Encounter: Payer: Self-pay | Admitting: Internal Medicine

## 2015-03-11 VITALS — BP 126/72 | HR 43 | Ht 71.0 in | Wt 198.4 lb

## 2015-03-11 DIAGNOSIS — I482 Chronic atrial fibrillation: Secondary | ICD-10-CM

## 2015-03-11 DIAGNOSIS — I4821 Permanent atrial fibrillation: Secondary | ICD-10-CM

## 2015-03-11 NOTE — Patient Instructions (Addendum)
Medication Instructions:  Your physician has recommended you make the following change in your medication: 1) STOP Metoprolol   Labwork: None ordered  Testing/Procedures: None ordered  Follow-Up: Your physician wants you to follow-up in: 9 months with Dr. Caryl Comes.  You will receive a reminder letter in the mail two months in advance. If you don't receive a letter, please call our office to schedule the follow-up appointment.   Any Other Special Instructions Will Be Listed Below (If Applicable). Thank you for choosing [facility]!!

## 2015-03-11 NOTE — Progress Notes (Signed)
HPI  Nicholas Bird is a 74 y.o. male seen in followup for atrial fibrillation which is permanent. He had been doing very well until April 15 2009 at which time he developed transient aphasia . It was felt that this was a TIA. There is apparently some lack of consensus as to whether this was a thromboembolic episode or not.   We ultimately decided to put him on oral anticoagulation therapy. The Coumadin is quite a nuisance.He thus ended up on Pradaxa. GI symptoms however intervened and he was changed to  Rivaroxaban  He has intercurrently had a melanoma resected. Thankfully lymph nodes were negative.    Past Medical History  Diagnosis Date  . Alcoholic cirrhosis of liver   . Personal history of colonic polyps 07/12/2008    TUBULAR ADENOMA  . Gout     per pt 12-09-2013  gout is stable  . Hyperlipemia   . Hypertension   . Alcohol dependence   . Atrial fibrillation, permanent     CARIOLOGIST-  DR Caryl Comes  . History of TIA (transient ischemic attack)     04/2009--  no residual  . History of pancreatitis     10/2012--  resolved   . Anticoagulant long-term use   . Elevated LFTs     CHRONIC  . Bladder diverticulum   . Hematuria   . BPH (benign prostatic hypertrophy)   . ED (erectile dysfunction) of organic origin   . Rosacea   . Gall stones   . Liver disease     Past Surgical History  Procedure Laterality Date  . Inguinal hernia repair Left 01-14-2001  . Laparoscopic cholecystectomy  10-21-2008    AND LIVER BX'S  . Laparoscopic inguinal hernia repair Bilateral 05-22-2009  . Cataract extraction w/ intraocular lens  implant, bilateral Bilateral 2008  . Cardiac catheterization  09-19-1998    false positive stress test---  normal cath and preserved lvf  . Cystoscopy with biopsy N/A 12/14/2013    Procedure: CYSTOSCOPY WITH BIOPSY;  Surgeon: Franchot Gallo, MD;  Location: [facility];  Service: Urology;  Laterality: N/A;    Current Outpatient Prescriptions   Medication Sig Dispense Refill  . allopurinol (ZYLOPRIM) 300 MG tablet Take 1 tablet (300 mg total) by mouth daily. 30 tablet 5  . apixaban (ELIQUIS) 5 MG TABS tablet Take 1 tablet (5 mg total) by mouth 2 (two) times daily. 60 tablet 1  . finasteride (PROSCAR) 5 MG tablet Take 5 mg by mouth.    . metoprolol tartrate (LOPRESSOR) 25 MG tablet Take 25 mg by mouth 2 (two) times daily.     . Multiple Vitamin (MULTI-VITAMINS) TABS Take by mouth.     No current facility-administered medications for this visit.    No Known Allergies  Review of Systems negative except from HPI and PMH  Physical Exam BP 126/72 mmHg  Pulse 43  Ht 5\' 11"  (1.803 m)  Wt 198 lb 6.4 oz (89.994 kg)  BMI 27.68 kg/m2 Well developed and well nourished in no acute distress HENT normal E scleral and icterus clear Neck Supple JVP flat; carotids brisk and full Clear to ausculation irregularly irregular no murmurs gallops or rub Soft with active bowel sounds No clubbing cyanosis none Edema Alert and oriented, grossly normal motor and sensory function Skin Warm and Dry  discoloraation  Of skin   afib 56 [birth year]  Assessment and  Plan  Atrial fibrillation  Prior TIA  Bradycardia  Doing well   Given his bradycardia, we  will stop his metoprolol. He will keep an eye on his blood pressure. In the event that it creeps into the 130 range, based on the [employer] data, would reinitiate therapy of some kind.

## 2015-03-18 ENCOUNTER — Other Ambulatory Visit: Payer: Self-pay | Admitting: *Deleted

## 2015-03-25 ENCOUNTER — Other Ambulatory Visit: Payer: Self-pay

## 2015-03-25 NOTE — Telephone Encounter (Signed)
03-11-15 office visit, MD discontinued Metoprolol tart, 25 mg. Pharm sent in refill request. We did not authorize.

## 2015-03-28 ENCOUNTER — Other Ambulatory Visit: Payer: Self-pay

## 2015-03-28 MED ORDER — ALLOPURINOL 300 MG PO TABS: 300.0000 mg | ORAL_TABLET | Freq: Every day | ORAL | Status: DC

## 2015-05-02 ENCOUNTER — Other Ambulatory Visit: Payer: Self-pay | Admitting: *Deleted

## 2015-05-02 MED ORDER — APIXABAN 5 MG PO TABS: 5.0000 mg | ORAL_TABLET | Freq: Two times a day (BID) | ORAL | Status: DC

## 2015-06-20 DIAGNOSIS — Z23 Encounter for immunization: Secondary | ICD-10-CM | POA: Diagnosis not present

## 2015-07-19 DIAGNOSIS — Z85828 Personal history of other malignant neoplasm of skin: Secondary | ICD-10-CM | POA: Diagnosis not present

## 2015-07-19 DIAGNOSIS — L723 Sebaceous cyst: Secondary | ICD-10-CM | POA: Diagnosis not present

## 2015-07-19 DIAGNOSIS — L738 Other specified follicular disorders: Secondary | ICD-10-CM | POA: Diagnosis not present

## 2015-07-19 DIAGNOSIS — D485 Neoplasm of uncertain behavior of skin: Secondary | ICD-10-CM | POA: Diagnosis not present

## 2015-07-19 DIAGNOSIS — D225 Melanocytic nevi of trunk: Secondary | ICD-10-CM | POA: Diagnosis not present

## 2015-07-19 DIAGNOSIS — L853 Xerosis cutis: Secondary | ICD-10-CM | POA: Diagnosis not present

## 2015-07-19 DIAGNOSIS — Z85820 Personal history of malignant melanoma of skin: Secondary | ICD-10-CM | POA: Diagnosis not present

## 2015-07-19 DIAGNOSIS — L820 Inflamed seborrheic keratosis: Secondary | ICD-10-CM | POA: Diagnosis not present

## 2015-07-19 DIAGNOSIS — L821 Other seborrheic keratosis: Secondary | ICD-10-CM | POA: Diagnosis not present

## 2015-07-26 ENCOUNTER — Telehealth: Payer: Self-pay | Admitting: Internal Medicine

## 2015-07-26 DIAGNOSIS — C434 Malignant melanoma of scalp and neck: Secondary | ICD-10-CM | POA: Diagnosis not present

## 2015-07-26 NOTE — Telephone Encounter (Signed)
Walk in pt form-letter left for Doc-gave to [facility]

## 2015-09-16 ENCOUNTER — Telehealth: Payer: Self-pay | Admitting: Internal Medicine

## 2015-09-16 NOTE — Telephone Encounter (Signed)
Walk in pt form-[employer] paper dropped off-Heather/Klein back Monday 09/19/15

## 2015-10-15 ENCOUNTER — Other Ambulatory Visit: Payer: Self-pay | Admitting: Endocrinology

## 2015-10-17 ENCOUNTER — Telehealth: Payer: Self-pay | Admitting: Endocrinology

## 2015-10-17 NOTE — Telephone Encounter (Signed)
Please refill x 1 cpx is due 

## 2015-10-17 NOTE — Telephone Encounter (Signed)
Rx submitted

## 2015-10-17 NOTE — Telephone Encounter (Signed)
Rx refilled and appointment letter mailed the pt.

## 2015-10-17 NOTE — Telephone Encounter (Signed)
Pt needs refill of Allopurinol sent to [facility].  He scheduled his Physical Exam already for 11/09/15

## 2015-10-17 NOTE — Telephone Encounter (Signed)
Please advise if ok to refill. Last office visit was 09/14/2014. f

## 2015-10-25 ENCOUNTER — Telehealth: Payer: Self-pay

## 2015-10-25 NOTE — Telephone Encounter (Signed)
Tier exception for Eliquis approved. Referral no [id number].

## 2015-10-25 NOTE — Telephone Encounter (Signed)
Tier exception for Eliquis 5mg  sent to [facility].

## 2015-11-06 ENCOUNTER — Other Ambulatory Visit: Payer: Self-pay | Admitting: Endocrinology

## 2015-11-09 ENCOUNTER — Encounter: Payer: Medicare HMO | Admitting: Endocrinology

## 2015-11-09 DIAGNOSIS — Z0000 Encounter for general adult medical examination without abnormal findings: Secondary | ICD-10-CM | POA: Diagnosis not present

## 2015-11-09 DIAGNOSIS — N323 Diverticulum of bladder: Secondary | ICD-10-CM | POA: Diagnosis not present

## 2015-11-09 DIAGNOSIS — R310 Gross hematuria: Secondary | ICD-10-CM | POA: Diagnosis not present

## 2015-11-11 ENCOUNTER — Encounter: Payer: Self-pay | Admitting: Endocrinology

## 2015-11-11 ENCOUNTER — Ambulatory Visit (INDEPENDENT_AMBULATORY_CARE_PROVIDER_SITE_OTHER): Payer: Medicare HMO | Admitting: Endocrinology

## 2015-11-11 VITALS — BP 126/86 | HR 63 | Temp 97.9°F | Ht 71.0 in | Wt 195.0 lb

## 2015-11-11 DIAGNOSIS — K219 Gastro-esophageal reflux disease without esophagitis: Secondary | ICD-10-CM | POA: Diagnosis not present

## 2015-11-11 DIAGNOSIS — E785 Hyperlipidemia, unspecified: Secondary | ICD-10-CM | POA: Diagnosis not present

## 2015-11-11 DIAGNOSIS — Z125 Encounter for screening for malignant neoplasm of prostate: Secondary | ICD-10-CM

## 2015-11-11 DIAGNOSIS — M1A9XX0 Chronic gout, unspecified, without tophus (tophi): Secondary | ICD-10-CM

## 2015-11-11 DIAGNOSIS — Z0000 Encounter for general adult medical examination without abnormal findings: Secondary | ICD-10-CM | POA: Diagnosis not present

## 2015-11-11 DIAGNOSIS — I10 Essential (primary) hypertension: Secondary | ICD-10-CM

## 2015-11-11 LAB — LIPID PANEL
CHOL/HDL RATIO: 4
Cholesterol: 208 mg/dL — ABNORMAL HIGH (ref 0–200)
HDL: 56 mg/dL (ref >39.00)
LDL Cholesterol: 136 mg/dL — ABNORMAL HIGH (ref 0–99)
NONHDL: 152.27
Triglycerides: 81 mg/dL (ref 0.0–149.0)
VLDL: 16.2 mg/dL (ref 0.0–40.0)

## 2015-11-11 LAB — CBC WITH DIFFERENTIAL/PLATELET
BASOS ABS: 0 10*3/uL (ref 0.0–0.1)
Basophils Relative: 0.4 % (ref 0.0–3.0)
EOS PCT: 2 % (ref 0.0–5.0)
Eosinophils Absolute: 0.1 10*3/uL (ref 0.0–0.7)
HCT: 42.9 % (ref 39.0–52.0)
HEMOGLOBIN: 14.4 g/dL (ref 13.0–17.0)
LYMPHS ABS: 1.3 10*3/uL (ref 0.7–4.0)
Lymphocytes Relative: 24.5 % (ref 12.0–46.0)
MCHC: 33.5 g/dL (ref 30.0–36.0)
MCV: 102.1 fl — AB (ref 78.0–100.0)
MONOS PCT: 12.5 % — AB (ref 3.0–12.0)
Monocytes Absolute: 0.6 10*3/uL (ref 0.1–1.0)
NEUTROS PCT: 60.6 % (ref 43.0–77.0)
Neutro Abs: 3.1 10*3/uL (ref 1.4–7.7)
Platelets: 207 10*3/uL (ref 150.0–400.0)
RBC: 4.2 Mil/uL — AB (ref 4.22–5.81)
RDW: 15 % (ref 11.5–15.5)
WBC: 5.1 10*3/uL (ref 4.0–10.5)

## 2015-11-11 LAB — TSH: TSH: 1.55 u[IU]/mL (ref 0.35–4.50)

## 2015-11-11 LAB — URINALYSIS, ROUTINE W REFLEX MICROSCOPIC
Bilirubin Urine: NEGATIVE
Ketones, ur: NEGATIVE
Leukocytes, UA: NEGATIVE
NITRITE: NEGATIVE
PH: 6.5 (ref 5.0–8.0)
SPECIFIC GRAVITY, URINE: 1.01 (ref 1.000–1.030)
URINE GLUCOSE: NEGATIVE
Urobilinogen, UA: 0.2 (ref 0.0–1.0)

## 2015-11-11 LAB — HEPATIC FUNCTION PANEL
ALT: 21 U/L (ref 0–53)
AST: 27 U/L (ref 0–37)
Albumin: 4.8 g/dL (ref 3.5–5.2)
Alkaline Phosphatase: 46 U/L (ref 39–117)
BILIRUBIN TOTAL: 1.3 mg/dL — AB (ref 0.2–1.2)
Bilirubin, Direct: 0.2 mg/dL (ref 0.0–0.3)
Total Protein: 7.1 g/dL (ref 6.0–8.3)

## 2015-11-11 LAB — BASIC METABOLIC PANEL
BUN: 17 mg/dL (ref 6–23)
CALCIUM: 9.7 mg/dL (ref 8.4–10.5)
CHLORIDE: 99 meq/L (ref 96–112)
CO2: 31 meq/L (ref 19–32)
CREATININE: 0.95 mg/dL (ref 0.40–1.50)
GFR: 82.36 mL/min (ref >60.00)
GLUCOSE: 96 mg/dL (ref 70–99)
Potassium: 4.7 mEq/L (ref 3.5–5.1)
Sodium: 137 mEq/L (ref 135–145)

## 2015-11-11 LAB — PSA: PSA: 1.77 ng/mL (ref 0.10–4.00)

## 2015-11-11 LAB — URIC ACID: Uric Acid, Serum: 4.4 mg/dL (ref 4.0–7.8)

## 2015-11-11 NOTE — Progress Notes (Signed)
Subjective:    Patient ID: Nicholas Bird, male    DOB: [birth year], 75 y.o.   MRN: [id number]  HPI Pt is here for regular wellness examination, and is feeling pretty well in general, and says chronic med probs are stable, except as noted below Past Medical History  Diagnosis Date  . Alcoholic cirrhosis of liver ([place])   . Personal history of colonic polyps 07/12/2008    TUBULAR ADENOMA  . Gout     per pt 12-09-2013  gout is stable  . Hyperlipemia   . Hypertension   . Alcohol dependence ([place])   . Atrial fibrillation, permanent ([place])     [place]  . History of TIA (transient ischemic attack)     04/2009--  no residual  . History of pancreatitis     10/2012--  resolved   . Anticoagulant long-term use   . Elevated LFTs     CHRONIC  . Bladder diverticulum   . Hematuria   . BPH (benign prostatic hypertrophy)   . ED (erectile dysfunction) of organic origin   . Rosacea   . Gall stones   . Liver disease     Past Surgical History  Procedure Laterality Date  . Inguinal hernia repair Left 01-14-2001  . Laparoscopic cholecystectomy  10-21-2008    AND LIVER BX'S  . Laparoscopic inguinal hernia repair Bilateral 05-22-2009  . Cataract extraction w/ intraocular lens  implant, bilateral Bilateral 2008  . Cardiac catheterization  09-19-1998    false positive stress test---  normal cath and preserved lvf  . Cystoscopy with biopsy N/A 12/14/2013    Procedure: CYSTOSCOPY WITH BIOPSY;  Surgeon: Franchot Gallo, MD;  Location: [facility];  Service: Urology;  Laterality: N/A;    Social History   Social History  . Marital Status: Married    Spouse Name: N/A  . Number of Children: 2  . Years of Education: N/A   Occupational History  . retired   .     Social History Main Topics  . Smoking status: Never Smoker   . Smokeless tobacco: Never Used  . Alcohol Use: 8.4 oz/week    14 Shots of liquor per week     Comment: 3 oz. per day  . Drug Use: No  .  Sexual Activity: Not on file   Other Topics Concern  . Not on file   Social History Narrative    Current Outpatient Prescriptions on File Prior to Visit  Medication Sig Dispense Refill  . allopurinol (ZYLOPRIM) 300 MG tablet take 1 tablet by mouth once daily 30 tablet 0  . apixaban (ELIQUIS) 5 MG TABS tablet Take 1 tablet (5 mg total) by mouth 2 (two) times daily. 60 tablet 5  . finasteride (PROSCAR) 5 MG tablet Take 5 mg by mouth.    . Multiple Vitamin (MULTI-VITAMINS) TABS Take by mouth.     No current facility-administered medications on file prior to visit.    No Known Allergies  Family History  Problem Relation Age of Onset  . Lung cancer Mother   . Skin cancer Sister   . Colon cancer Neg Hx     BP 126/86 mmHg  Pulse 63  Temp(Src) 97.9 F (36.6 C) (Oral)  Ht 5\' 11"  (1.803 m)  Wt 195 lb (88.451 kg)  BMI 27.21 kg/m2  SpO2 98%   Review of Systems  Constitutional: Negative for fever.  HENT: Negative for hearing loss.   Eyes: Negative for visual disturbance.  Respiratory: Negative for shortness of breath.   Cardiovascular: Negative for chest pain.  Gastrointestinal: Negative for anal bleeding.  Endocrine: Negative for cold intolerance.  Genitourinary: Negative for dysuria.  Musculoskeletal: Negative for back pain.  Skin: Negative for rash.  Allergic/Immunologic: Negative for environmental allergies.  Neurological: Negative for syncope.  Hematological: Bruises/bleeds easily.  Psychiatric/Behavioral: Negative for dysphoric mood.       Objective:   Physical Exam VS: see vs page GEN: no distress HEAD: head: no deformity eyes: no periorbital swelling, no proptosis external nose and ears are normal mouth: no lesion seen NECK: supple, thyroid is not enlarged. Old healed surgical scars at the neck (LN resections) CHEST WALL: no deformity LUNGS: clear to auscultation BREASTS:  No gynecomastia.  CV: reg rate and rhythm, no murmur.  Varicosities on the ankles.    ABD: abdomen is soft, nontender.  no hepatosplenomegaly.  not distended.  no hernia.   GENITALIA/RECTAL/PROSTATE: sees urology MUSCULOSKELETAL: muscle bulk and strength are grossly normal.  no obvious joint swelling.  gait is normal and steady EXTEMITIES: no deformity.  no ulcer on the feet.  feet are of normal color and temp.  no edema.  Heave calluses on the feet. PULSES: dorsalis pedis intact bilat.  no carotid bruit NEURO:  cn 2-12 grossly intact.   readily moves all 4's.  sensation is intact to touch on the feet, but decreased from normal.   SKIN:  Normal texture and temperature.  No rash or suspicious lesion is visible.   NODES:  None palpable at the neck PSYCH: alert, well-oriented.  Does not appear anxious nor depressed.       Assessment & Plan:  Wellness visit today, with problems stable, except as noted.  Patient is advised the following: (no avs, due to computer freeze-up)

## 2015-11-14 ENCOUNTER — Other Ambulatory Visit: Payer: Self-pay | Admitting: Internal Medicine

## 2015-11-22 DIAGNOSIS — C434 Malignant melanoma of scalp and neck: Secondary | ICD-10-CM | POA: Diagnosis not present

## 2015-12-09 ENCOUNTER — Encounter: Payer: Self-pay | Admitting: Gastroenterology

## 2016-01-05 DIAGNOSIS — D224 Melanocytic nevi of scalp and neck: Secondary | ICD-10-CM | POA: Diagnosis not present

## 2016-01-05 DIAGNOSIS — L718 Other rosacea: Secondary | ICD-10-CM | POA: Diagnosis not present

## 2016-01-05 DIAGNOSIS — L738 Other specified follicular disorders: Secondary | ICD-10-CM | POA: Diagnosis not present

## 2016-01-05 DIAGNOSIS — Z85828 Personal history of other malignant neoplasm of skin: Secondary | ICD-10-CM | POA: Diagnosis not present

## 2016-01-05 DIAGNOSIS — D485 Neoplasm of uncertain behavior of skin: Secondary | ICD-10-CM | POA: Diagnosis not present

## 2016-01-05 DIAGNOSIS — Z85820 Personal history of malignant melanoma of skin: Secondary | ICD-10-CM | POA: Diagnosis not present

## 2016-01-05 DIAGNOSIS — L720 Epidermal cyst: Secondary | ICD-10-CM | POA: Diagnosis not present

## 2016-01-05 DIAGNOSIS — C434 Malignant melanoma of scalp and neck: Secondary | ICD-10-CM | POA: Diagnosis not present

## 2016-01-05 DIAGNOSIS — D692 Other nonthrombocytopenic purpura: Secondary | ICD-10-CM | POA: Diagnosis not present

## 2016-01-05 DIAGNOSIS — C4441 Basal cell carcinoma of skin of scalp and neck: Secondary | ICD-10-CM | POA: Diagnosis not present

## 2016-01-23 DIAGNOSIS — D485 Neoplasm of uncertain behavior of skin: Secondary | ICD-10-CM | POA: Diagnosis not present

## 2016-01-23 DIAGNOSIS — C434 Malignant melanoma of scalp and neck: Secondary | ICD-10-CM | POA: Diagnosis not present

## 2016-01-24 DIAGNOSIS — R911 Solitary pulmonary nodule: Secondary | ICD-10-CM | POA: Diagnosis not present

## 2016-01-24 DIAGNOSIS — C439 Malignant melanoma of skin, unspecified: Secondary | ICD-10-CM | POA: Diagnosis not present

## 2016-01-25 DIAGNOSIS — C434 Malignant melanoma of scalp and neck: Secondary | ICD-10-CM | POA: Diagnosis not present

## 2016-01-26 ENCOUNTER — Ambulatory Visit (INDEPENDENT_AMBULATORY_CARE_PROVIDER_SITE_OTHER): Payer: Medicare HMO | Admitting: Cardiology

## 2016-01-26 ENCOUNTER — Encounter: Payer: Self-pay | Admitting: Cardiology

## 2016-01-26 ENCOUNTER — Telehealth: Payer: Self-pay | Admitting: *Deleted

## 2016-01-26 VITALS — BP 138/96 | HR 61 | Ht 71.0 in | Wt 196.4 lb

## 2016-01-26 DIAGNOSIS — R001 Bradycardia, unspecified: Secondary | ICD-10-CM | POA: Diagnosis not present

## 2016-01-26 DIAGNOSIS — Z7901 Long term (current) use of anticoagulants: Secondary | ICD-10-CM

## 2016-01-26 DIAGNOSIS — I10 Essential (primary) hypertension: Secondary | ICD-10-CM | POA: Diagnosis not present

## 2016-01-26 MED ORDER — AMLODIPINE BESYLATE 5 MG PO TABS: 5.0000 mg | ORAL_TABLET | Freq: Every day | ORAL | Status: DC

## 2016-01-26 NOTE — Progress Notes (Signed)
Cardiology Office Note   Date:  01/26/2016   ID:  Nicholas, Bird [birth year], MRN [id number]  PCP:  Renato Shin, MD  Cardiologist:  Dr. Caryl Comes     Chief Complaint  Patient presents with  . Hypertension      History of Present Illness: Nicholas Bird is a 75 y.o. male who presents for elevated BP.  meds have been increased and BP continues to be elevated.    He has a hx. of atrial fibrillation which is permanent. He had been doing very well until April 15 2009 at which time he developed transient aphasia . It was felt that this was a TIA. There is apparently some lack of consensus as to whether this was a thromboembolic episode or not.  We ultimately decided to put him on oral anticoagulation therapy. The Coumadin is quite a nuisance.He thus ended up on Pradaxa. GI symptoms however intervened and he was changed to Rivaroxaban but stopped due to hematuria.  Now on Eliquis and does have occ hematuria followed by Dr. Cipriano Bunker.    He is here for HTN.  195/90 and 165/90, somewhat improved.  Previously on metoprolol for rate control,  stoped for HR 43.  He is to have another melanoma of scalp removed and wanted BP controlled.    He denies chest pain or sob.      Past Medical History  Diagnosis Date  . Alcoholic cirrhosis of liver ([place])   . Personal history of colonic polyps 07/12/2008    TUBULAR ADENOMA  . Gout     per pt 12-09-2013  gout is stable  . Hyperlipemia   . Hypertension   . Alcohol dependence ([place])   . Atrial fibrillation, permanent ([employer])     [place]  . History of TIA (transient ischemic attack)     04/2009--  no residual  . History of pancreatitis     10/2012--  resolved   . Anticoagulant long-term use   . Elevated LFTs     CHRONIC  . Bladder diverticulum   . Hematuria   . BPH (benign prostatic hypertrophy)   . ED (erectile dysfunction) of organic origin   . Rosacea   . Gall stones   . Liver disease     Past Surgical History    Procedure Laterality Date  . Inguinal hernia repair Left 01-14-2001  . Laparoscopic cholecystectomy  10-21-2008    AND LIVER BX'S  . Laparoscopic inguinal hernia repair Bilateral 05-22-2009  . Cataract extraction w/ intraocular lens  implant, bilateral Bilateral 2008  . Cardiac catheterization  09-19-1998    false positive stress test---  normal cath and preserved lvf  . Cystoscopy with biopsy N/A 12/14/2013    Procedure: CYSTOSCOPY WITH BIOPSY;  Surgeon: Franchot Gallo, MD;  Location: [facility];  Service: Urology;  Laterality: N/A;     Current Outpatient Prescriptions  Medication Sig Dispense Refill  . allopurinol (ZYLOPRIM) 300 MG tablet take 1 tablet by mouth once daily 30 tablet 0  . cephALEXin (KEFLEX) 500 MG capsule Take 1 capsule by mouth 2 (two) times daily.    Marland Kitchen ELIQUIS 5 MG TABS tablet take 1 tablet by mouth twice a day 60 tablet 1  . finasteride (PROSCAR) 5 MG tablet Take 5 mg by mouth.    . Multiple Vitamin (MULTI-VITAMINS) TABS Take 1 tablet by mouth daily.      No current facility-administered medications for this visit.    Allergies:   Review  of patient's allergies indicates no known allergies.    Social History:  The patient  reports that he has never smoked. He has never used smokeless tobacco. He reports that he drinks about 8.4 oz of alcohol per week. He reports that he does not use illicit drugs.   Family History:  The patient's family history includes Lung cancer in his mother; Skin cancer in his sister. There is no history of Colon cancer.    ROS:  General:no colds or fevers, no weight changes Skin:no rashes or ulcers HEENT:no blurred vision, no congestion CV:see HPI PUL:see HPI GI:no diarrhea constipation or melena, no indigestion GU:no hematuria, no dysuria MS:no joint pain, no claudication Neuro:no syncope, no lightheadedness Endo:no diabetes, no thyroid disease  Wt Readings from Last 3 Encounters:  01/26/16 196 lb 6.4 oz  (89.086 kg)  11/11/15 195 lb (88.451 kg)  03/11/15 198 lb 6.4 oz (89.994 kg)     PHYSICAL EXAM: VS:  BP 138/96 mmHg  Pulse 61  Ht 5\' 11"  (1.803 m)  Wt 196 lb 6.4 oz (89.086 kg)  BMI 27.40 kg/m2 , BMI Body mass index is 27.4 kg/(m^2). General:Pleasant affect, NAD Skin:Warm and dry, brisk capillary refill HEENT:normocephalic, sclera clear, mucus membranes moist Neck:supple, no JVD, no bruits  Heart:irreg irreg without murmur, gallup, rub or click Lungs:clear without rales, rhonchi, or wheezes [id number], non tender, + BS, do not palpate liver spleen or masses Ext:no lower ext edema, 2+ pedal pulses, 2+ radial pulses Neuro:alert and oriented X 3, MAE, follows commands, + facial symmetry    EKG:  EKG is ordered today. The ekg ordered today demonstrates a fib rate controlled no acute changes.     Recent Labs: 11/11/2015: ALT 21; BUN 17; Creatinine, Ser 0.95; Hemoglobin 14.4; Platelets 207.0; Potassium 4.7; Sodium 137; TSH 1.55    Lipid Panel    Component Value Date/Time   CHOL 208* 11/11/2015 0946   TRIG 81.0 11/11/2015 0946   HDL 56.00 11/11/2015 0946   CHOLHDL 4 11/11/2015 0946   VLDL 16.2 11/11/2015 0946   LDLCALC 136* 11/11/2015 0946   LDLDIRECT 145.2 07/22/2013 1138       Other studies Reviewed: Additional studies/ records that were reviewed today include: previous notes, EKGs..   ASSESSMENT AND PLAN:  1.  Hypertension uncontrolled, will add amlodipine 5 mg daily.  He is to follow up with Dr. Caryl Comes next month.  2. Permanent a fib.  On Eliquis, he will continue for procedure per surgeon.   3.   Hx TIA  4. + bradycardia on BB.   Current medicines are reviewed with the patient today.  The patient Has no concerns regarding medicines.  The following changes have been made:  See above Labs/ tests ordered today include:see above  Disposition:   FU:  see above  Signed, Cecilie Kicks, NP  01/26/2016 3:31 PM    [address], [place] [place], [place] Phone: [phone]; Fax: [phone]

## 2016-01-26 NOTE — Patient Instructions (Signed)
Your physician has recommended you make the following change in your medication:  1.) start amlodipine (Norvasc) 5 mg -- one tablet once daily  Your physician recommends that you keep your follow up appointment with Dr. Caryl Comes.

## 2016-01-26 NOTE — Telephone Encounter (Signed)
Patient dropped off paper today outlining that his BP has been running high and has his annual appt w/ Nicholas Bird on 6/5. He is having melanoma surgery on Tuesday and his oncologist was concerned about BP 180/90 while in office. He is requesting appt to discuss possibly starting him on a BP med prior to his procedure next week.  Spoke with patient's wife and appt made for this afternoon at 3pm with Nicholas Kicks, NP to discuss plan of care and possibly starting something for BP control.

## 2016-01-31 DIAGNOSIS — Z8673 Personal history of transient ischemic attack (TIA), and cerebral infarction without residual deficits: Secondary | ICD-10-CM | POA: Diagnosis not present

## 2016-01-31 DIAGNOSIS — I4891 Unspecified atrial fibrillation: Secondary | ICD-10-CM | POA: Diagnosis not present

## 2016-01-31 DIAGNOSIS — Z79899 Other long term (current) drug therapy: Secondary | ICD-10-CM | POA: Diagnosis not present

## 2016-01-31 DIAGNOSIS — C434 Malignant melanoma of scalp and neck: Secondary | ICD-10-CM | POA: Diagnosis not present

## 2016-01-31 DIAGNOSIS — E785 Hyperlipidemia, unspecified: Secondary | ICD-10-CM | POA: Diagnosis not present

## 2016-01-31 DIAGNOSIS — L905 Scar conditions and fibrosis of skin: Secondary | ICD-10-CM | POA: Diagnosis not present

## 2016-01-31 DIAGNOSIS — Z801 Family history of malignant neoplasm of trachea, bronchus and lung: Secondary | ICD-10-CM | POA: Diagnosis not present

## 2016-01-31 DIAGNOSIS — D234 Other benign neoplasm of skin of scalp and neck: Secondary | ICD-10-CM | POA: Diagnosis not present

## 2016-01-31 DIAGNOSIS — C792 Secondary malignant neoplasm of skin: Secondary | ICD-10-CM | POA: Diagnosis not present

## 2016-01-31 DIAGNOSIS — K7581 Nonalcoholic steatohepatitis (NASH): Secondary | ICD-10-CM | POA: Diagnosis not present

## 2016-02-01 ENCOUNTER — Other Ambulatory Visit: Payer: Self-pay | Admitting: Internal Medicine

## 2016-02-02 ENCOUNTER — Telehealth: Payer: Self-pay | Admitting: Internal Medicine

## 2016-02-02 NOTE — Telephone Encounter (Signed)
Will forward to Dr. Klein to review. 

## 2016-02-02 NOTE — Telephone Encounter (Signed)
BP med was given 01-26-16  Amlodipine-BP running high around 185/90 for almost a week now-has appt with [facility] Monday 02-06-16 -pls advise  [phone] OR [phone]

## 2016-02-06 ENCOUNTER — Ambulatory Visit (INDEPENDENT_AMBULATORY_CARE_PROVIDER_SITE_OTHER): Payer: Medicare HMO | Admitting: Internal Medicine

## 2016-02-06 ENCOUNTER — Encounter: Payer: Self-pay | Admitting: Internal Medicine

## 2016-02-06 VITALS — BP 144/76 | HR 68 | Ht 71.0 in | Wt 195.0 lb

## 2016-02-06 DIAGNOSIS — I482 Chronic atrial fibrillation: Secondary | ICD-10-CM | POA: Diagnosis not present

## 2016-02-06 DIAGNOSIS — I4821 Permanent atrial fibrillation: Secondary | ICD-10-CM

## 2016-02-06 DIAGNOSIS — I10 Essential (primary) hypertension: Secondary | ICD-10-CM | POA: Diagnosis not present

## 2016-02-06 DIAGNOSIS — R001 Bradycardia, unspecified: Secondary | ICD-10-CM | POA: Diagnosis not present

## 2016-02-06 MED ORDER — METOPROLOL SUCCINATE ER 25 MG PO TB24: 25.0000 mg | ORAL_TABLET | Freq: Every day | ORAL | Status: DC

## 2016-02-06 MED ORDER — AMLODIPINE BESYLATE 10 MG PO TABS: 10.0000 mg | ORAL_TABLET | Freq: Every day | ORAL | Status: DC

## 2016-02-06 NOTE — Progress Notes (Signed)
HPI  Nicholas Bird is a 74 y.o. male seen in followup for atrial fibrillation which is permanent. He had been doing very well until April 15 2009 at which time he developed transient aphasia . It was felt that this was a TIA. There is apparently some lack of consensus as to whether this was a thromboembolic episode or not.   We ultimately decided to put him on oral anticoagulation therapy. The Coumadin is quite a nuisance.He thus ended up on Pradaxa. GI symptoms however intervened and he was changed to  Rivaroxaban  He called me over the weekend as he is hypertensive. We resumed his metoprolol and uptitrated his amlodipine from 5--10  He has intercurrently had another melanoma resected. That scan was negative and margins are to be reported on Wednesday   Past Medical History  Diagnosis Date  . Alcoholic cirrhosis of liver ([place])   . Personal history of colonic polyps 07/12/2008    TUBULAR ADENOMA  . Gout     per pt 12-09-2013  gout is stable  . Hyperlipemia   . Hypertension   . Alcohol dependence ([place])   . Atrial fibrillation, permanent ([place])     [place]  . History of TIA (transient ischemic attack)     04/2009--  no residual  . History of pancreatitis     10/2012--  resolved   . Anticoagulant long-term use   . Elevated LFTs     CHRONIC  . Bladder diverticulum   . Hematuria   . BPH (benign prostatic hypertrophy)   . ED (erectile dysfunction) of organic origin   . Rosacea   . Gall stones   . Liver disease     Past Surgical History  Procedure Laterality Date  . Inguinal hernia repair Left 01-14-2001  . Laparoscopic cholecystectomy  10-21-2008    AND LIVER BX'S  . Laparoscopic inguinal hernia repair Bilateral 05-22-2009  . Cataract extraction w/ intraocular lens  implant, bilateral Bilateral 2008  . Cardiac catheterization  09-19-1998    false positive stress test---  normal cath and preserved lvf  . Cystoscopy with biopsy N/A 12/14/2013    Procedure:  CYSTOSCOPY WITH BIOPSY;  Surgeon: Franchot Gallo, MD;  Location: [facility];  Service: Urology;  Laterality: N/A;    Current Outpatient Prescriptions  Medication Sig Dispense Refill  . allopurinol (ZYLOPRIM) 300 MG tablet take 1 tablet by mouth once daily 30 tablet 0  . amLODipine (NORVASC) 5 MG tablet Take 10 mg by mouth daily.    . cephALEXin (KEFLEX) 500 MG capsule Take 1 capsule by mouth 2 (two) times daily.    Marland Kitchen ELIQUIS 5 MG TABS tablet take 1 tablet by mouth twice a day 60 tablet 1  . finasteride (PROSCAR) 5 MG tablet Take 5 mg by mouth.    . metoprolol (LOPRESSOR) 50 MG tablet Take 50 mg by mouth 2 (two) times daily.  1  . Multiple Vitamin (MULTI-VITAMINS) TABS Take 1 tablet by mouth daily.      No current facility-administered medications for this visit.    No Known Allergies  Review of Systems negative except from HPI and PMH  Physical Exam BP 144/76 mmHg  Pulse 68  Ht 5\' 11"  (1.803 m)  Wt 195 lb (88.451 kg)  BMI 27.21 kg/m2 Well developed and well nourished in no acute distress HENT normal E scleral and icterus clear Neck Supple JVP flat; carotids brisk and full Clear to ausculation irregularly irregular no murmurs gallops or  rub Soft with active bowel sounds No clubbing cyanosis none Edema Alert and oriented, grossly normal motor and sensory function Skin Warm and Dry  discoloraation  Of skin   afib 56 -/09/43  Assessment and  Plan  Atrial fibrillation  Prior TIA  Bradycardia  Hypertension    We'll increase his amlodipine from 5--10. Over the weekend we prescribed metoprolol 50 twice a day. His bradycardia is going to be an issue so we'll decrease it to metoprolol 25 succinate once daily. The event that the bradycardia becomes an issue we will use an ACE inhibitor.

## 2016-02-06 NOTE — Telephone Encounter (Signed)
The patient called Dr. Caryl Comes over the weekend and was seen in clinic today.

## 2016-02-06 NOTE — Patient Instructions (Signed)
Medication Instructions: - Your physician has recommended you make the following change in your medication:  1) Increase norvasc (amlodipine) to 10 mg one tablet by mouth once daily 2) Stop metoprolol tartrate 3) Start metoprolol succinate 25 mg one tablet by mouth once daily   Labwork: - none  Procedures/Testing: - none  Follow-Up: - Your physician wants you to follow-up in: 1 year with Dr. Caryl Comes. You will receive a reminder letter in the mail two months in advance. If you don't receive a letter, please call our office to schedule the follow-up appointment.  Any Additional Special Instructions Will Be Listed Below (If Applicable).     If you need a refill on your cardiac medications before your next appointment, please call your pharmacy.

## 2016-02-08 DIAGNOSIS — C434 Malignant melanoma of scalp and neck: Secondary | ICD-10-CM | POA: Diagnosis not present

## 2016-02-13 ENCOUNTER — Telehealth: Payer: Self-pay | Admitting: Internal Medicine

## 2016-02-13 MED ORDER — LOSARTAN POTASSIUM 50 MG PO TABS: 50.0000 mg | ORAL_TABLET | Freq: Every day | ORAL | Status: DC

## 2016-02-13 MED ORDER — AMLODIPINE BESYLATE 5 MG PO TABS: 5.0000 mg | ORAL_TABLET | Freq: Every day | ORAL | Status: DC

## 2016-02-13 NOTE — Telephone Encounter (Signed)
Follow up   Pt is calling back to speak to the rn about the follow up on Dr.Klein directions

## 2016-02-13 NOTE — Telephone Encounter (Signed)
Reviewed with Dr. Caryl Comes- orders received to have the patient decrease amlodipine to 5 mg once daily & start losartan 50 mg once daily. I have notified the patient of the above and he is agreeable. He states he has amlodipine 5 mg tablets. I will send his losartan RX in to the pharmacy.

## 2016-02-13 NOTE — Telephone Encounter (Signed)
I spoke with the patient and advised him Dr. Caryl Comes is in clinic and still needs to review. I will call him back before I leave today. He is agreeable.

## 2016-02-13 NOTE — Telephone Encounter (Signed)
Pt states he has had swelling in his feet and ankles for 4-5 days. Pt states the swelling is better in the AM and is worse by the end of the day. Pt states swelling is bad but he can still get his shoes on.  Pt does keep his feet and legs elevated some during the day. Pt states amlodipine was started about 3 weeks ago and increased from 5mg  to 10mg  daily about a week ago. Pt denies shortness of breath, heart racing,feeling of puffiness in hands, face or abdomen, states he has probably gained 2 pounds. Pt has not been able to check his BP since he has been on higher dose of amlodipine. Pt states he is going out of town tomorrow.. Pt advised I will forward to Dr Caryl Comes for review.

## 2016-02-13 NOTE — Telephone Encounter (Signed)
New Message  Pt c/o swelling: STAT is pt has developed SOB within 24 hours  1. How long have you been experiencing swelling? 4 days   2. Where is the swelling located? Ankles and feet pt believes that it is from the medication amlodipine.   3.  Are you currently taking a "fluid pill"? No   4.  Are you currently SOB? No   5.  Have you traveled recently? No   Pt states that he is leaving town tomorrow and would like to retrieve a new script today.

## 2016-02-22 DIAGNOSIS — C434 Malignant melanoma of scalp and neck: Secondary | ICD-10-CM | POA: Diagnosis not present

## 2016-02-29 DIAGNOSIS — C434 Malignant melanoma of scalp and neck: Secondary | ICD-10-CM | POA: Diagnosis not present

## 2016-03-05 ENCOUNTER — Telehealth: Payer: Self-pay | Admitting: Internal Medicine

## 2016-03-05 NOTE — Telephone Encounter (Signed)
Spoke with patient and he has had a bowel movement now. He thinks his problem was from Imodium he took. He will call back for further problems

## 2016-03-08 ENCOUNTER — Other Ambulatory Visit: Payer: Self-pay

## 2016-03-08 ENCOUNTER — Other Ambulatory Visit: Payer: Self-pay | Admitting: Internal Medicine

## 2016-03-08 ENCOUNTER — Other Ambulatory Visit: Payer: Medicare HMO

## 2016-03-08 ENCOUNTER — Telehealth: Payer: Self-pay | Admitting: Internal Medicine

## 2016-03-08 DIAGNOSIS — R197 Diarrhea, unspecified: Secondary | ICD-10-CM | POA: Diagnosis not present

## 2016-03-08 NOTE — Telephone Encounter (Signed)
New message:   He started taking Losartan about 3 weeks ago. On day 5 he started having diarrhea,would like to be changed to something else please.

## 2016-03-08 NOTE — Telephone Encounter (Signed)
Pt aware and states he will come and try to give specimen for cdiff. States he can stop the antibiotic because it is only for acne.

## 2016-03-08 NOTE — Telephone Encounter (Signed)
Patient returning call.  Please call [phone]. Thank you .

## 2016-03-08 NOTE — Telephone Encounter (Signed)
Keflex may cause diarrhea. He should check with prescribing MD to see if he can stop it. Check stool for C.diff. If negative, imodium as needed is ok.

## 2016-03-08 NOTE — Telephone Encounter (Signed)
I have looked up candesartan ; it does not seem to be associated with diarrhea. Let's begin him on 8 mg a day (this i.e. half of a 60 mg tablet for one week and he can uptitrated to 60 mg if his blood pressure remains elevated and no significant side effects

## 2016-03-08 NOTE — Telephone Encounter (Signed)
I left a message for the patient to call at the contact # left on this message.

## 2016-03-08 NOTE — Telephone Encounter (Signed)
Left message for pt to call back.  Pt states he has had diarrhea for 2 weeks. States he has been taking keflex for acne. Pt is going out of the country on Monday and wants to know what he should do. He states he should be back in town around [place] today. Please advise.

## 2016-03-08 NOTE — Telephone Encounter (Signed)
I spoke with the patient. He states he started to have diarrhea 5 days after starting losartan. This was started on 02/13/16 after a phone discussion with the patient. He was having edema on amlodipine. Below orders received:    Expand All Collapse All   Reviewed with Dr. Caryl Comes- orders received to have the patient decrease amlodipine to 5 mg once daily & start losartan 50 mg once daily. I have notified the patient of the above and he is agreeable. He states he has amlodipine 5 mg tablets. I will send his losartan RX in to the pharmacy       Per the patient, he has continued on the losartan, but did not take his dose this morning. I advised I will review with Dr. Caryl Comes and call him back. He is agreeable.

## 2016-03-08 NOTE — Telephone Encounter (Signed)
Called pt back and left message for him to call back.  Spoke with pt and he states he was having diarrhea and took an Imodium and it constipated him for several days and then it "broke loose" and the diarrhea is back full force. He is supposed to take the Keflex 500mg  BID for acne. He has not taken it this morning. Please advise.

## 2016-03-08 NOTE — Telephone Encounter (Signed)
I am very confused. He call 3 days ago stating that he did not have a bowel movement for one week. What's going on? Is he still taking Keflex?

## 2016-03-09 LAB — CLOSTRIDIUM DIFFICILE BY PCR: Toxigenic C. Difficile by PCR: NOT DETECTED

## 2016-03-09 MED ORDER — CANDESARTAN CILEXETIL 16 MG PO TABS: 16.0000 mg | ORAL_TABLET | Freq: Every day | ORAL | Status: DC

## 2016-03-09 NOTE — Telephone Encounter (Signed)
Follow Up:   Pt says he is still waiting to hear something,he says he is going out of the country.

## 2016-03-09 NOTE — Telephone Encounter (Signed)
Thanks for clarification request  He is leaving Monday am  i would have him take 16 mg daily  thanks

## 2016-03-09 NOTE — Telephone Encounter (Signed)
Will send back to Dr. Caryl Comes for clarification on medication order.

## 2016-03-09 NOTE — Telephone Encounter (Signed)
Left message for patient to call back  

## 2016-03-12 ENCOUNTER — Telehealth: Payer: Self-pay | Admitting: Internal Medicine

## 2016-03-12 NOTE — Telephone Encounter (Signed)
Spoke with pt and he talked to Dr. Henrene Pastor over the phone yesterday. He wanted to let Dr. Henrene Pastor know that he is only passing liquid with no substance to the BM. Pt wanted to know if he should take a fleets enema. Per Dr. Henrene Pastor he should not do an enema as this will irritate the hemorrhoid that he had mentioned was bleeding. States pt should be taking metamucil and he may take a dose or 2 of miralax as needed. States he discussed with pt that it may take about a week for her bowels to regulate. Left message for pt to call back.  Spoke with pt and he is aware.

## 2016-03-13 ENCOUNTER — Telehealth: Payer: Self-pay | Admitting: Internal Medicine

## 2016-03-13 NOTE — Telephone Encounter (Signed)
Noted  

## 2016-03-14 ENCOUNTER — Telehealth: Payer: Self-pay | Admitting: Internal Medicine

## 2016-03-14 NOTE — Telephone Encounter (Signed)
I agree. This point we can decrease MiraLAX and continue fiber. He can continue to experiment to find the proper balance to achieve the desired bowel composition. May take some time for his system to re-equilibrate. I do not think this is anything serious. I'm happy to see him in the office in a couple weeks for follow-up. Thanks

## 2016-03-14 NOTE — Telephone Encounter (Signed)
Left message for pt to call back.  Pt states he continues to have liquid stools. He is taking metamucil and continues to take the miralax. Pt wants to know "what success looks like." Discussed with pt that since he was having stools, that would be success. Let pt know at this point he may want to decrease or stop the miralax and let the stool form by continuing to take the metamucil. Dr. Henrene Pastor please advise.

## 2016-03-14 NOTE — Telephone Encounter (Signed)
Spoke with pt and he is aware. Instructed him to call back further with any questions or concerns.

## 2016-03-20 ENCOUNTER — Telehealth: Payer: Self-pay | Admitting: Internal Medicine

## 2016-03-20 NOTE — Telephone Encounter (Signed)
Per Dr. Henrene Pastor pt  can continue to experiment to find the proper balance to achieve the desired bowel composition. Pt knows to take metamucil and miralax to achieve desired bowel movements.

## 2016-04-02 ENCOUNTER — Other Ambulatory Visit: Payer: Self-pay | Admitting: Internal Medicine

## 2016-04-10 DIAGNOSIS — C434 Malignant melanoma of scalp and neck: Secondary | ICD-10-CM | POA: Diagnosis not present

## 2016-04-30 ENCOUNTER — Other Ambulatory Visit: Payer: Self-pay | Admitting: Endocrinology

## 2016-05-01 DIAGNOSIS — C44219 Basal cell carcinoma of skin of left ear and external auricular canal: Secondary | ICD-10-CM | POA: Diagnosis not present

## 2016-05-01 DIAGNOSIS — Z85828 Personal history of other malignant neoplasm of skin: Secondary | ICD-10-CM | POA: Diagnosis not present

## 2016-05-01 DIAGNOSIS — Z85820 Personal history of malignant melanoma of skin: Secondary | ICD-10-CM | POA: Diagnosis not present

## 2016-05-03 ENCOUNTER — Telehealth: Payer: Self-pay | Admitting: Internal Medicine

## 2016-05-03 NOTE — Telephone Encounter (Signed)
I left a message for the patient to call. 

## 2016-05-03 NOTE — Telephone Encounter (Signed)
Patient's in lead her with blood pressure measurements and noting that he continues to have edema with amlodipine. Alternatives were considered. We will recommend either increase his metoprolol from 25--50 or adding chlorthalidone 25 mg. He will need a metabolic profile 2 weeks after the initiation of his diuretic if he chooses that.

## 2016-05-09 NOTE — Telephone Encounter (Signed)
Pt retunured your call, wants to wait until your return to the office 05-10-16, pls call [phone]

## 2016-05-10 MED ORDER — METOPROLOL SUCCINATE ER 50 MG PO TB24: 50.0000 mg | ORAL_TABLET | Freq: Every day | ORAL | 11 refills | Status: DC

## 2016-05-10 NOTE — Telephone Encounter (Signed)
I called and spoke with the patient. He is aware of Dr. Olin Pia recommendations to stop amlodipine and increase metoprolol succ to 50 mg daily or that we can stop amlodipine, leave metoprolol as is, and start chlorthalidone 25 mg once daily. The patient would prefer to stop amlodipine and increase metoprolol succinate to 50 mg once daily.  I will send in a new RX to [place] for him per his request. He will monitor BP/ HR and call us back with any concerns.

## 2016-05-11 DIAGNOSIS — Z961 Presence of intraocular lens: Secondary | ICD-10-CM | POA: Diagnosis not present

## 2016-05-11 DIAGNOSIS — Z0100 Encounter for examination of eyes and vision without abnormal findings: Secondary | ICD-10-CM | POA: Diagnosis not present

## 2016-06-09 DIAGNOSIS — Z23 Encounter for immunization: Secondary | ICD-10-CM | POA: Diagnosis not present

## 2016-07-10 DIAGNOSIS — L821 Other seborrheic keratosis: Secondary | ICD-10-CM | POA: Diagnosis not present

## 2016-07-10 DIAGNOSIS — Z85820 Personal history of malignant melanoma of skin: Secondary | ICD-10-CM | POA: Diagnosis not present

## 2016-07-10 DIAGNOSIS — D1801 Hemangioma of skin and subcutaneous tissue: Secondary | ICD-10-CM | POA: Diagnosis not present

## 2016-07-10 DIAGNOSIS — L812 Freckles: Secondary | ICD-10-CM | POA: Diagnosis not present

## 2016-07-10 DIAGNOSIS — L570 Actinic keratosis: Secondary | ICD-10-CM | POA: Diagnosis not present

## 2016-07-10 DIAGNOSIS — Z85828 Personal history of other malignant neoplasm of skin: Secondary | ICD-10-CM | POA: Diagnosis not present

## 2016-07-10 DIAGNOSIS — L853 Xerosis cutis: Secondary | ICD-10-CM | POA: Diagnosis not present

## 2016-07-24 DIAGNOSIS — C434 Malignant melanoma of scalp and neck: Secondary | ICD-10-CM | POA: Diagnosis not present

## 2016-10-03 ENCOUNTER — Other Ambulatory Visit: Payer: Self-pay | Admitting: Endocrinology

## 2016-11-01 ENCOUNTER — Other Ambulatory Visit: Payer: Self-pay | Admitting: Endocrinology

## 2016-11-07 ENCOUNTER — Telehealth: Payer: Self-pay | Admitting: Endocrinology

## 2016-11-07 MED ORDER — ALLOPURINOL 300 MG PO TABS: 300.0000 mg | ORAL_TABLET | Freq: Every day | ORAL | 0 refills | Status: DC

## 2016-11-07 NOTE — Telephone Encounter (Signed)
Pt called in requesting refill for Allopurinol be sent to [facility] on [address].  He is scheduled for his physical next Friday.

## 2016-11-07 NOTE — Telephone Encounter (Signed)
30 day refill submitted to [facility] pending his appointment on 11/16/2016.

## 2016-11-09 DIAGNOSIS — N323 Diverticulum of bladder: Secondary | ICD-10-CM | POA: Diagnosis not present

## 2016-11-09 DIAGNOSIS — R310 Gross hematuria: Secondary | ICD-10-CM | POA: Diagnosis not present

## 2016-11-09 DIAGNOSIS — R351 Nocturia: Secondary | ICD-10-CM | POA: Diagnosis not present

## 2016-11-09 DIAGNOSIS — N401 Enlarged prostate with lower urinary tract symptoms: Secondary | ICD-10-CM | POA: Diagnosis not present

## 2016-11-16 ENCOUNTER — Encounter: Payer: Medicare HMO | Admitting: Endocrinology

## 2016-11-16 ENCOUNTER — Encounter: Payer: Self-pay | Admitting: Endocrinology

## 2016-11-16 ENCOUNTER — Ambulatory Visit (INDEPENDENT_AMBULATORY_CARE_PROVIDER_SITE_OTHER): Payer: Medicare HMO | Admitting: Endocrinology

## 2016-11-16 VITALS — BP 158/82 | HR 72 | Ht 71.5 in | Wt 197.6 lb

## 2016-11-16 DIAGNOSIS — Z125 Encounter for screening for malignant neoplasm of prostate: Secondary | ICD-10-CM

## 2016-11-16 DIAGNOSIS — R200 Anesthesia of skin: Secondary | ICD-10-CM | POA: Diagnosis not present

## 2016-11-16 DIAGNOSIS — I10 Essential (primary) hypertension: Secondary | ICD-10-CM

## 2016-11-16 DIAGNOSIS — Z0000 Encounter for general adult medical examination without abnormal findings: Secondary | ICD-10-CM

## 2016-11-16 DIAGNOSIS — E785 Hyperlipidemia, unspecified: Secondary | ICD-10-CM | POA: Diagnosis not present

## 2016-11-16 DIAGNOSIS — I4891 Unspecified atrial fibrillation: Secondary | ICD-10-CM | POA: Diagnosis not present

## 2016-11-16 DIAGNOSIS — E119 Type 2 diabetes mellitus without complications: Secondary | ICD-10-CM

## 2016-11-16 DIAGNOSIS — M1A9XX0 Chronic gout, unspecified, without tophus (tophi): Secondary | ICD-10-CM | POA: Diagnosis not present

## 2016-11-16 LAB — HEPATIC FUNCTION PANEL
ALT: 22 U/L (ref 0–53)
AST: 25 U/L (ref 0–37)
Albumin: 4.6 g/dL (ref 3.5–5.2)
Alkaline Phosphatase: 63 U/L (ref 39–117)
BILIRUBIN DIRECT: 0.3 mg/dL (ref 0.0–0.3)
BILIRUBIN TOTAL: 1.3 mg/dL — AB (ref 0.2–1.2)
Total Protein: 7.1 g/dL (ref 6.0–8.3)

## 2016-11-16 LAB — BASIC METABOLIC PANEL
BUN: 15 mg/dL (ref 6–23)
CALCIUM: 9.8 mg/dL (ref 8.4–10.5)
CHLORIDE: 97 meq/L (ref 96–112)
CO2: 30 meq/L (ref 19–32)
Creatinine, Ser: 0.93 mg/dL (ref 0.40–1.50)
GFR: 84.17 mL/min (ref >60.00)
GLUCOSE: 106 mg/dL — AB (ref 70–99)
Potassium: 4.6 mEq/L (ref 3.5–5.1)
SODIUM: 133 meq/L — AB (ref 135–145)

## 2016-11-16 LAB — CBC WITH DIFFERENTIAL/PLATELET
BASOS PCT: 0.4 % (ref 0.0–3.0)
Basophils Absolute: 0 10*3/uL (ref 0.0–0.1)
EOS ABS: 0.1 10*3/uL (ref 0.0–0.7)
EOS PCT: 2.4 % (ref 0.0–5.0)
HCT: 42.8 % (ref 39.0–52.0)
HEMOGLOBIN: 14.5 g/dL (ref 13.0–17.0)
LYMPHS PCT: 21.3 % (ref 12.0–46.0)
Lymphs Abs: 1.2 10*3/uL (ref 0.7–4.0)
MCHC: 34 g/dL (ref 30.0–36.0)
MCV: 105.8 fl — ABNORMAL HIGH (ref 78.0–100.0)
MONOS PCT: 13.6 % — AB (ref 3.0–12.0)
Monocytes Absolute: 0.8 10*3/uL (ref 0.1–1.0)
NEUTROS PCT: 62.3 % (ref 43.0–77.0)
Neutro Abs: 3.5 10*3/uL (ref 1.4–7.7)
Platelets: 215 10*3/uL (ref 150.0–400.0)
RBC: 4.05 Mil/uL — AB (ref 4.22–5.81)
RDW: 13.7 % (ref 11.5–15.5)
WBC: 5.7 10*3/uL (ref 4.0–10.5)

## 2016-11-16 LAB — TSH: TSH: 2.69 u[IU]/mL (ref 0.35–4.50)

## 2016-11-16 LAB — LIPID PANEL
CHOLESTEROL: 194 mg/dL (ref 0–200)
HDL: 54.1 mg/dL (ref >39.00)
LDL Cholesterol: 121 mg/dL — ABNORMAL HIGH (ref 0–99)
NonHDL: 140.33
Total CHOL/HDL Ratio: 4
Triglycerides: 96 mg/dL (ref 0.0–149.0)
VLDL: 19.2 mg/dL (ref 0.0–40.0)

## 2016-11-16 LAB — VITAMIN B12: Vitamin B-12: 627 pg/mL (ref 211–911)

## 2016-11-16 LAB — POCT GLYCOSYLATED HEMOGLOBIN (HGB A1C): HEMOGLOBIN A1C: 5.4

## 2016-11-16 LAB — URIC ACID: URIC ACID, SERUM: 4 mg/dL (ref 4.0–7.8)

## 2016-11-16 LAB — PSA: PSA: 5.4 ng/mL — AB (ref 0.10–4.00)

## 2016-11-16 NOTE — Progress Notes (Signed)
Subjective:    Patient ID: Nicholas Bird, male    DOB: 03/04/1941, 76 y.o.   MRN: [id number]  HPI Pt is here for regular wellness examination, and is feeling pretty well in general, and says chronic med probs are stable, except as noted below Past Medical History:  Diagnosis Date  . Alcohol dependence ([place])   . Alcoholic cirrhosis of liver ([place])   . Anticoagulant long-term use   . Atrial fibrillation, permanent ([place])    [place]  . Bladder diverticulum   . BPH (benign prostatic hypertrophy)   . ED (erectile dysfunction) of organic origin   . Elevated LFTs    CHRONIC  . Gall stones   . Gout    per pt 12-09-2013  gout is stable  . Hematuria   . History of pancreatitis    10/2012--  resolved   . History of TIA (transient ischemic attack)    04/2009--  no residual  . Hyperlipemia   . Hypertension   . Liver disease   . Personal history of colonic polyps 07/12/2008   TUBULAR ADENOMA  . Rosacea     Past Surgical History:  Procedure Laterality Date  . CARDIAC CATHETERIZATION  09-19-1998   false positive stress test---  normal cath and preserved lvf  . CATARACT EXTRACTION W/ INTRAOCULAR LENS  IMPLANT, BILATERAL Bilateral 2008  . CYSTOSCOPY WITH BIOPSY N/A 12/14/2013   Procedure: CYSTOSCOPY WITH BIOPSY;  Surgeon: Franchot Gallo, MD;  Location: [facility];  Service: Urology;  Laterality: N/A;  . INGUINAL HERNIA REPAIR Left 01-14-2001  . LAPAROSCOPIC CHOLECYSTECTOMY  10-21-2008   AND LIVER BX'S  . LAPAROSCOPIC INGUINAL HERNIA REPAIR Bilateral 05-22-2009    Social History   Social History  . Marital status: Married    Spouse name: N/A  . Number of children: 2  . Years of education: N/A   Occupational History  . retired   .  Retired   Social History Main Topics  . Smoking status: Never Smoker  . Smokeless tobacco: Never Used  . Alcohol use 8.4 oz/week    14 Shots of liquor per week     Comment: 3 oz. per day  . Drug use: No  . Sexual  activity: Not on file   Other Topics Concern  . Not on file   Social History Narrative  . No narrative on file    Current Outpatient Prescriptions on File Prior to Visit  Medication Sig Dispense Refill  . allopurinol (ZYLOPRIM) 300 MG tablet Take 1 tablet (300 mg total) by mouth daily. 30 tablet 0  . candesartan (ATACAND) 16 MG tablet Take 1 tablet (16 mg total) by mouth daily. 90 tablet 3  . ELIQUIS 5 MG TABS tablet take 1 tablet by mouth twice a day 60 tablet 10  . finasteride (PROSCAR) 5 MG tablet Take 5 mg by mouth.    . metoprolol succinate (TOPROL-XL) 50 MG 24 hr tablet Take 1 tablet (50 mg total) by mouth daily. Take with or immediately following a meal. 30 tablet 11  . Multiple Vitamin (MULTI-VITAMINS) TABS Take 1 tablet by mouth daily.      No current facility-administered medications on file prior to visit.     No Known Allergies  Family History  Problem Relation Age of Onset  . Lung cancer Mother   . Skin cancer Sister   . Colon cancer Neg Hx     BP (!) 158/82 (BP Location: Left Arm, Patient Position: Sitting,  Cuff Size: Normal)   Pulse 72   Ht 5' 11.5" (1.816 m)   Wt 197 lb 9.6 oz (89.6 kg)   SpO2 98%   BMI 27.18 kg/m     Review of Systems Constitutional: Negative for fever.  HENT: Negative for hearing loss.   Eyes: Negative for visual disturbance.  Respiratory: Negative for shortness of breath.   Cardiovascular: Negative for chest pain.  Gastrointestinal: Negative for anal bleeding.  Endocrine: Negative for cold intolerance.  Genitourinary: Negative for dysuria.  Musculoskeletal: Negative for back pain.  Skin: Negative for open wound.  Allergic/Immunologic: Negative for environmental allergies.  Neurological: Negative for syncope.  Hematological: Bruises/bleeds easily.  Psychiatric/Behavioral: Negative for dysphoric mood.     Objective:   Physical Exam VS: see vs page GEN: no distress HEAD: head: no deformity eyes: no periorbital swelling, no  proptosis external nose and ears are normal mouth: no lesion seen NECK: supple, thyroid is not enlarged. Old healed surgical scars at the neck (LN resections) CHEST WALL: no deformity LUNGS: clear to auscultation BREASTS:  No gynecomastia.  CV: irreg rate and rhythm, no murmur.  Varicosities on the ankles; erythema of the feet and legs.  ABD: abdomen is soft, nontender.  no hepatosplenomegaly.  not distended.  no hernia.   GENITALIA/RECTAL/PROSTATE: sees urology MUSCULOSKELETAL: muscle bulk and strength are grossly normal.  no obvious joint swelling.  gait is normal and steady EXTEMITIES: no deformity.  no ulcer on the feet.  feet are of normal color and temp.  no edema.  Heavy calluses on the feet. There is bilateral onychomycosis of the toenails.  PULSES: dorsalis pedis intact bilat.  no carotid bruit NEURO:  cn 2-12 grossly intact.   readily moves all 4's.  sensation is intact to touch on the feet, but decreased from normal.   SKIN:  Normal texture and temperature.  No rash or suspicious lesion is visible.   NODES:  None palpable at the neck PSYCH: alert, well-oriented.  Does not appear anxious nor depressed.   ecg machine is not working today    Assessment & Plan:  Wellness visit today, with problems stable, except as noted.   Subjective:   Patient here for Medicare annual wellness visit and management of other chronic and acute problems.     Risk factors: advanced age    85 of Physicians Providing Medical Care to Patient:  See "snapshot"   Activities of Daily Living: In your present state of health, do you have any difficulty performing the following activities?:  Preparing food and eating?: No  Bathing yourself: No  Getting dressed: No  Using the toilet:No  Moving around from place to place: No  In the past year have you fallen or had a near fall?:N o    Home Safety: Has smoke detector and wears seat belts. Firearms are safely stored.  No excess sun exposure.  Diet  and Exercise  Current exercise habits: pt says good Dietary issues discussed: pt reports a healthy diet   Depression Screen  Q1: Over the past two weeks, have you felt down, depressed or hopeless? no  Q2: Over the past two weeks, have you felt little interest or pleasure in doing things? no   The following portions of the patient's history were reviewed and updated as appropriate: allergies, current medications, past family history, past medical history, past social history, past surgical history and problem list.   Review of Systems  Denies hearing loss, and visual loss Objective:   Vision:  Sees opthalmologist, so he declines [facility] today Hearing: grossly normal Body mass index:  See vs page Msk: pt easily and quickly performs "get-up-and-go" from a sitting position Cognitive Impairment Assessment: cognition, memory and judgment appear normal.  remembers 3/3 at 5 minutes.  excellent recall.  can easily read and write a sentence.  alert and oriented x 3.    Assessment:   Medicare wellness utd on preventive parameters.    Plan:   During the course of the visit the patient was educated and counseled about appropriate screening and preventive services including:        Fall prevention advice is given.   Diabetes screening is done today Nutrition counseling is offered  Vaccines / LABS Zostavax / Pneumococcal Vaccine  today  PSA  Patient Instructions (the written plan) was given to the patient.

## 2016-11-16 NOTE — Patient Instructions (Signed)
Please consider these measures for your health:  minimize alcohol.  Do not use tobacco products.  Have a colonoscopy at least every 10 years from age 75.  Keep firearms safely stored.  Always use seat belts.  have working smoke alarms in your home.  See an eye doctor and dentist regularly.  Never drive under the influence of alcohol or drugs (including prescription drugs).  Those with fair skin should take precautions against the sun, and should carefully examine their skin once per month, for any new or changed moles. It is critically important to prevent falling down (keep floor areas well-lit, dry, and free of loose objects.  If you have a cane, walker, or wheelchair, you should use it, even for short trips around the house.  Wear flat-soled shoes.  Also, try not to rush) blood tests are requested for you today.  We'll let you know about the results.  Please return in 1 year.

## 2016-12-06 ENCOUNTER — Other Ambulatory Visit: Payer: Self-pay | Admitting: Internal Medicine

## 2016-12-24 DIAGNOSIS — N401 Enlarged prostate with lower urinary tract symptoms: Secondary | ICD-10-CM | POA: Diagnosis not present

## 2016-12-24 DIAGNOSIS — R310 Gross hematuria: Secondary | ICD-10-CM | POA: Diagnosis not present

## 2016-12-24 DIAGNOSIS — R319 Hematuria, unspecified: Secondary | ICD-10-CM | POA: Diagnosis not present

## 2016-12-26 ENCOUNTER — Telehealth: Payer: Self-pay | Admitting: Internal Medicine

## 2016-12-26 NOTE — Telephone Encounter (Signed)
Ok

## 2016-12-26 NOTE — Telephone Encounter (Signed)
Pt states he had ct done with Dr. Diona Fanti and he was diagnosed with diverticulitis. Pt was placed on Augmentin BID for 10 days. Pt also states he started taking align yesterday. Discussed with pt to make sure he finished the course of meds and to call us back if he was still having issues once he completed the Augmentin. Pt verbalized understanding and wanted to make sure Dr. Henrene Pastor was aware. Dr. Henrene Pastor notified.

## 2016-12-27 DIAGNOSIS — L570 Actinic keratosis: Secondary | ICD-10-CM | POA: Diagnosis not present

## 2016-12-27 DIAGNOSIS — Z85828 Personal history of other malignant neoplasm of skin: Secondary | ICD-10-CM | POA: Diagnosis not present

## 2016-12-27 DIAGNOSIS — Z85820 Personal history of malignant melanoma of skin: Secondary | ICD-10-CM | POA: Diagnosis not present

## 2016-12-27 DIAGNOSIS — D485 Neoplasm of uncertain behavior of skin: Secondary | ICD-10-CM | POA: Diagnosis not present

## 2016-12-27 DIAGNOSIS — L821 Other seborrheic keratosis: Secondary | ICD-10-CM | POA: Diagnosis not present

## 2016-12-27 DIAGNOSIS — D692 Other nonthrombocytopenic purpura: Secondary | ICD-10-CM | POA: Diagnosis not present

## 2017-01-07 ENCOUNTER — Telehealth: Payer: Self-pay | Admitting: Internal Medicine

## 2017-01-07 NOTE — Telephone Encounter (Signed)
Pt had diverticulitis and was given antibiotics by urologist. Pt states Dr. Diona Fanti wanted him follow-up with Dr. Henrene Pastor. Pt scheduled to see Dr. Henrene Pastor 02/06/17@10am . Pt aware of appt.

## 2017-01-31 ENCOUNTER — Other Ambulatory Visit: Payer: Self-pay | Admitting: Endocrinology

## 2017-02-04 ENCOUNTER — Other Ambulatory Visit: Payer: Self-pay | Admitting: Internal Medicine

## 2017-02-05 ENCOUNTER — Ambulatory Visit (INDEPENDENT_AMBULATORY_CARE_PROVIDER_SITE_OTHER): Payer: Medicare HMO | Admitting: Internal Medicine

## 2017-02-05 ENCOUNTER — Encounter: Payer: Self-pay | Admitting: Internal Medicine

## 2017-02-05 VITALS — BP 132/76 | HR 63 | Ht 71.0 in | Wt 193.0 lb

## 2017-02-05 DIAGNOSIS — Z85820 Personal history of malignant melanoma of skin: Secondary | ICD-10-CM | POA: Diagnosis not present

## 2017-02-05 DIAGNOSIS — I482 Chronic atrial fibrillation: Secondary | ICD-10-CM

## 2017-02-05 DIAGNOSIS — L720 Epidermal cyst: Secondary | ICD-10-CM | POA: Diagnosis not present

## 2017-02-05 DIAGNOSIS — L570 Actinic keratosis: Secondary | ICD-10-CM | POA: Diagnosis not present

## 2017-02-05 DIAGNOSIS — R001 Bradycardia, unspecified: Secondary | ICD-10-CM | POA: Diagnosis not present

## 2017-02-05 DIAGNOSIS — Z85828 Personal history of other malignant neoplasm of skin: Secondary | ICD-10-CM | POA: Diagnosis not present

## 2017-02-05 DIAGNOSIS — I10 Essential (primary) hypertension: Secondary | ICD-10-CM | POA: Diagnosis not present

## 2017-02-05 DIAGNOSIS — I4821 Permanent atrial fibrillation: Secondary | ICD-10-CM

## 2017-02-05 DIAGNOSIS — D485 Neoplasm of uncertain behavior of skin: Secondary | ICD-10-CM | POA: Diagnosis not present

## 2017-02-05 MED ORDER — HYDROCHLOROTHIAZIDE 12.5 MG PO CAPS: 12.5000 mg | ORAL_CAPSULE | Freq: Every day | ORAL | 11 refills | Status: DC

## 2017-02-05 NOTE — Patient Instructions (Addendum)
Medication Instructions: - Your physician has recommended you make the following change in your medication:  1) Start Hydrochlorothiazide 12.5 mg- take one tablet by mouth once daily  Labwork: - Your physician recommends that you return for lab work in: 2 weeks- BMP- Thursday 02/28/17 (lab hours are 7:30 am-4:45 pm)  Procedures/Testing: - none ordered  Follow-Up: - Your physician wants you to follow-up in: 1 year with Dr. Caryl Comes. You will receive a reminder letter in the mail two months in advance. If you don't receive a letter, please call our office to schedule the follow-up appointment.   Any Additional Special Instructions Will Be Listed Below (If Applicable).     If you need a refill on your cardiac medications before your next appointment, please call your pharmacy.

## 2017-02-05 NOTE — Progress Notes (Signed)
HPI  Nicholas Bird is a 76 y.o. male seen in followup for atrial fibrillation which is permanent. He had been doing very well until April 15 2009 at which time he developed transient aphasia . It was felt that this was a TIA. There is apparently some lack of consensus as to whether this was a thromboembolic episode or not.   We ultimately decided to put him on oral anticoagulation therapy. The Coumadin is quite a nuisance.He thus ended up on Pradaxa. GI symptoms however intervened and he was changed to  Rivaroxaban >> apixoban   Date Cr Hgb  3/18 0.93 14.5            He has intercurrently had another melanoma resected. That scan was negative and margins are to be reported on Wednesday   Past Medical History:  Diagnosis Date  . Alcohol dependence ([place])   . Alcoholic cirrhosis of liver ([place])   . Anticoagulant long-term use   . Atrial fibrillation, permanent ([place])    [place]  . Bladder diverticulum   . BPH (benign prostatic hypertrophy)   . ED (erectile dysfunction) of organic origin   . Elevated LFTs    CHRONIC  . Gall stones   . Gout    per pt 12-09-2013  gout is stable  . Hematuria   . History of pancreatitis    10/2012--  resolved   . History of TIA (transient ischemic attack)    04/2009--  no residual  . Hyperlipemia   . Hypertension   . Liver disease   . Personal history of colonic polyps 07/12/2008   TUBULAR ADENOMA  . Rosacea     Past Surgical History:  Procedure Laterality Date  . CARDIAC CATHETERIZATION  09-19-1998   false positive stress test---  normal cath and preserved lvf  . CATARACT EXTRACTION W/ INTRAOCULAR LENS  IMPLANT, BILATERAL Bilateral 2008  . CYSTOSCOPY WITH BIOPSY N/A 12/14/2013   Procedure: CYSTOSCOPY WITH BIOPSY;  Surgeon: Franchot Gallo, MD;  Location: [facility];  Service: Urology;  Laterality: N/A;  . INGUINAL HERNIA REPAIR Left 01-14-2001  . LAPAROSCOPIC CHOLECYSTECTOMY  10-21-2008   AND LIVER BX'S  .  LAPAROSCOPIC INGUINAL HERNIA REPAIR Bilateral 05-22-2009    Current Outpatient Prescriptions  Medication Sig Dispense Refill  . allopurinol (ZYLOPRIM) 300 MG tablet Take 1 tablet (300 mg total) by mouth daily. 30 tablet 0  . allopurinol (ZYLOPRIM) 300 MG tablet take 1 tablet by mouth once daily 30 tablet 0  . candesartan (ATACAND) 16 MG tablet take 1 tablet by mouth once daily 90 tablet 2  . ELIQUIS 5 MG TABS tablet take 1 tablet by mouth twice a day 60 tablet 6  . finasteride (PROSCAR) 5 MG tablet Take 5 mg by mouth.    . metoprolol succinate (TOPROL-XL) 50 MG 24 hr tablet Take 1 tablet (50 mg total) by mouth daily. Take with or immediately following a meal. 30 tablet 11  . Multiple Vitamin (MULTI-VITAMINS) TABS Take 1 tablet by mouth daily.      No current facility-administered medications for this visit.     No Known Allergies  Review of Systems negative except from HPI and PMH  Physical Exam There were no vitals taken for this visit. Well developed and well nourished in no acute distress HENT normal E scleral and icterus clear Neck Supple JVP flat; carotids brisk and full Clear to ausculation irregularly irregular no murmurs gallops or rub Soft with active bowel sounds No clubbing  cyanosis none Edema Alert and oriented, grossly normal motor and sensory function Skin Warm and Dry  discoloraation  Of skin   afib 56 -/09/43  Assessment and  Plan  Atrial fibrillation  Prior TIA  Bradycardia  Hypertension    We'll increase his amlodipine from 5--10. Over the weekend we prescribed metoprolol 50 twice a day. His bradycardia is going to be an issue so we'll decrease it to metoprolol 25 succinate once daily. The event that the bradycardia becomes an issue we will use an ACE inhibitor.

## 2017-02-06 ENCOUNTER — Encounter: Payer: Self-pay | Admitting: Internal Medicine

## 2017-02-06 ENCOUNTER — Ambulatory Visit (INDEPENDENT_AMBULATORY_CARE_PROVIDER_SITE_OTHER): Payer: Medicare HMO | Admitting: Internal Medicine

## 2017-02-06 ENCOUNTER — Ambulatory Visit: Payer: Medicare HMO | Admitting: Internal Medicine

## 2017-02-06 VITALS — BP 140/68 | HR 56 | Ht 70.47 in | Wt 193.2 lb

## 2017-02-06 DIAGNOSIS — K5732 Diverticulitis of large intestine without perforation or abscess without bleeding: Secondary | ICD-10-CM | POA: Diagnosis not present

## 2017-02-06 DIAGNOSIS — R935 Abnormal findings on diagnostic imaging of other abdominal regions, including retroperitoneum: Secondary | ICD-10-CM

## 2017-02-06 DIAGNOSIS — C434 Malignant melanoma of scalp and neck: Secondary | ICD-10-CM | POA: Diagnosis not present

## 2017-02-06 NOTE — Progress Notes (Signed)
HISTORY OF PRESENT ILLNESS:  Nicholas Bird is a 76 y.o. male former patient of Dr. Verl Blalock with multiple medical problems as listed below who has been followed in this office for adenomatous colon polyps and compensated alcoholic cirrhosis of the liver. Last evaluated April 2016 for surveillance colonoscopy. Diminutive adenomas removed. Moderate sigmoid diverticulosis present. Follow-up in 5 years recommended. Last upper endoscopy for variceal screening at that same time. Normal exam. He is sent today by his urologist regarding abnormal CT scan suggesting diverticulitis. Patient was being evaluated for recurrent hematuria. As part of that workup a CT of the abdomen and pelvis with and without contrast demonstrated sigmoid diverticulitis with pericolonic inflammatory stranding. Incidental changes of cirrhosis also noted. The patient was not having fever or pain. He was treated with antibiotics for 7-10 days. He had no difficulties during or since. Bowel habits are regular. No bleeding. No weight loss or other complaints.  REVIEW OF SYSTEMS:  All non-GI ROS negative except for hematuria, heart rhythm change, urinary frequency at night  Past Medical History:  Diagnosis Date  . Alcohol dependence (Nicholas Bird)   . Alcoholic cirrhosis of liver (Nicholas Bird)   . Anticoagulant long-term use   . Atrial fibrillation, permanent (Nicholas Bird)    Nicholas Bird  . Bladder diverticulum   . BPH (benign prostatic hypertrophy)   . ED (erectile dysfunction) of organic origin   . Elevated LFTs    CHRONIC  . Gall stones   . Gout    per pt 12-09-2013  gout is stable  . Hematuria   . History of pancreatitis    10/2012--  resolved   . History of TIA (transient ischemic attack)    04/2009--  no residual  . Hyperlipemia   . Hypertension   . Liver disease   . Personal history of colonic polyps 07/12/2008   TUBULAR ADENOMA  . Rosacea     Past Surgical History:  Procedure Laterality Date  . CARDIAC CATHETERIZATION   09-19-1998   false positive stress test---  normal cath and preserved lvf  . CATARACT EXTRACTION W/ INTRAOCULAR LENS  IMPLANT, BILATERAL Bilateral 2008  . CYSTOSCOPY WITH BIOPSY N/A 12/14/2013   Procedure: CYSTOSCOPY WITH BIOPSY;  Surgeon: Franchot Gallo, MD;  Location: [facility];  Service: Urology;  Laterality: N/A;  . INGUINAL HERNIA REPAIR Left 01-14-2001  . LAPAROSCOPIC CHOLECYSTECTOMY  10-21-2008   AND LIVER BX'S  . LAPAROSCOPIC INGUINAL HERNIA REPAIR Bilateral 05-22-2009    Social History Nicholas Bird  reports that he has never smoked. He has never used smokeless tobacco. He reports that he drinks about 8.4 oz of alcohol per week . He reports that he does not use drugs.  family history includes Lung cancer in his mother; Skin cancer in his sister.  No Known Allergies     PHYSICAL EXAMINATION: Vital signs: BP 140/68 (BP Location: Left Arm, Patient Position: Sitting, Cuff Size: Normal)   Pulse (!) 56 Comment: irregular  Ht 5' 10.47" (1.79 m)   Wt 193 lb 4 oz (87.7 kg)   BMI 27.36 kg/m   Constitutional: generally well-appearing, no acute distress Psychiatric: alert and oriented x3, cooperative Eyes: extraocular movements intact, anicteric, conjunctiva pink Mouth: oral pharynx moist, no lesions Neck: supple no lymphadenopathy Cardiovascular: heart regular rate and rhythm, no murmur Lungs: clear to auscultation bilaterally Abdomen: soft, nontender, nondistended, no obvious ascites, no peritoneal signs, normal bowel sounds, no organomegaly Rectal: Omitted Extremities: no clubbing or cyanosis. Trace lower extremity edema bilaterally  Skin: no lesions on visible extremities Neuro: No focal deficits. Cranial nerves intact. No asterixis.    ASSESSMENT:  #1. Abnormal CT scan with radiographic evidence of diverticulitis but no clinical evidence. Treated with course of antibiotics. Reasonable based on findings. Remains Asymptomatic today #2. History of  adenomatous colon polyps. Last colonoscopy 2 years ago as described #3. Compensated alcoholic cirrhosis of the liver   PLAN:  #1. Discussed diverticulosis and diverticulitis #2. Expectant management. Told contact the office should he develop signs or symptoms consistent with diverticulitis #3. High-fiber diet #4. Avoid alcohol #5. Surveillance EGD next year #6. Surveillance colonoscopy in 3 years #7. Return to the care of PCP. Interval GI follow-up as needed  25 minutes spent face-to-face with the patient. Greater than 50% a time use for counseling regarding diverticular disease and his other GI issues with recommendations and surveillance plans as outlined

## 2017-02-06 NOTE — Patient Instructions (Signed)
We will call you when we receive the copy of your CT  Please follow up as needed

## 2017-02-07 ENCOUNTER — Telehealth: Payer: Self-pay

## 2017-02-07 NOTE — Telephone Encounter (Signed)
Lm on vm that we had received the ct from [facility] and that it showed diverticulitis and cirrhosis but nothing else of any concern.

## 2017-02-13 ENCOUNTER — Ambulatory Visit: Payer: Medicare HMO | Admitting: Internal Medicine

## 2017-02-28 ENCOUNTER — Other Ambulatory Visit: Payer: Medicare HMO | Admitting: *Deleted

## 2017-02-28 DIAGNOSIS — I10 Essential (primary) hypertension: Secondary | ICD-10-CM | POA: Diagnosis not present

## 2017-02-28 LAB — BASIC METABOLIC PANEL
BUN / CREAT RATIO: 13 (ref 10–24)
BUN: 13 mg/dL (ref 8–27)
CHLORIDE: 94 mmol/L — AB (ref 96–106)
CO2: 24 mmol/L (ref 20–29)
Calcium: 9.5 mg/dL (ref 8.6–10.2)
Creatinine, Ser: 1.01 mg/dL (ref 0.76–1.27)
GFR calc non Af Amer: 72 mL/min/{1.73_m2} (ref >59)
GFR, EST AFRICAN AMERICAN: 84 mL/min/{1.73_m2} (ref >59)
Glucose: 139 mg/dL — ABNORMAL HIGH (ref 65–99)
POTASSIUM: 4.5 mmol/L (ref 3.5–5.2)
SODIUM: 132 mmol/L — AB (ref 134–144)

## 2017-03-01 ENCOUNTER — Telehealth: Payer: Self-pay | Admitting: Internal Medicine

## 2017-03-01 MED ORDER — CANDESARTAN CILEXETIL-HCTZ 16-12.5 MG PO TABS: 1.0000 | ORAL_TABLET | Freq: Every day | ORAL | 6 refills | Status: DC

## 2017-03-01 NOTE — Telephone Encounter (Signed)
Will forward to Dr. Caryl Comes to review- the patient had a follow up BMP yesterday which has not been signed off. He was started on HCTZ 12.5 mg once daily on 02/05/17.

## 2017-03-01 NOTE — Telephone Encounter (Signed)
Labs ok per Dr. Caryl Comes- ok to send in candesartan/hctz 16/12.5 mg once daily. This has been sent and the patient is aware.

## 2017-03-01 NOTE — Telephone Encounter (Signed)
Patient calling, states that he had a blood test to see if the dieretic was affecting his potassium levels and it was not. Patient states that he thought that Dr. Caryl Comes would write a prescription for the dieretic combined with the candesarten. Patient is leaving to go out of town tomorrow morning and would like to have medication today. Thanks.

## 2017-03-13 ENCOUNTER — Telehealth: Payer: Self-pay | Admitting: Endocrinology

## 2017-03-13 MED ORDER — ALLOPURINOL 300 MG PO TABS: 300.0000 mg | ORAL_TABLET | Freq: Every day | ORAL | 2 refills | Status: DC

## 2017-03-13 NOTE — Telephone Encounter (Signed)
Pharmacy called and states that Patient needs a refill on allopurinol. Patient is about to go out of town for 10 days.

## 2017-03-13 NOTE — Telephone Encounter (Signed)
Refill submitted. 

## 2017-05-16 ENCOUNTER — Other Ambulatory Visit: Payer: Self-pay | Admitting: Internal Medicine

## 2017-05-16 DIAGNOSIS — H40013 Open angle with borderline findings, low risk, bilateral: Secondary | ICD-10-CM | POA: Diagnosis not present

## 2017-05-16 DIAGNOSIS — Z961 Presence of intraocular lens: Secondary | ICD-10-CM | POA: Diagnosis not present

## 2017-05-16 DIAGNOSIS — H5201 Hypermetropia, right eye: Secondary | ICD-10-CM | POA: Diagnosis not present

## 2017-05-16 DIAGNOSIS — H5212 Myopia, left eye: Secondary | ICD-10-CM | POA: Diagnosis not present

## 2017-05-29 DIAGNOSIS — R69 Illness, unspecified: Secondary | ICD-10-CM | POA: Diagnosis not present

## 2017-06-17 ENCOUNTER — Other Ambulatory Visit: Payer: Self-pay | Admitting: Endocrinology

## 2017-07-05 DIAGNOSIS — H40013 Open angle with borderline findings, low risk, bilateral: Secondary | ICD-10-CM | POA: Diagnosis not present

## 2017-07-11 DIAGNOSIS — Z85828 Personal history of other malignant neoplasm of skin: Secondary | ICD-10-CM | POA: Diagnosis not present

## 2017-07-11 DIAGNOSIS — L720 Epidermal cyst: Secondary | ICD-10-CM | POA: Diagnosis not present

## 2017-07-11 DIAGNOSIS — Z85820 Personal history of malignant melanoma of skin: Secondary | ICD-10-CM | POA: Diagnosis not present

## 2017-07-11 DIAGNOSIS — D692 Other nonthrombocytopenic purpura: Secondary | ICD-10-CM | POA: Diagnosis not present

## 2017-07-11 DIAGNOSIS — L812 Freckles: Secondary | ICD-10-CM | POA: Diagnosis not present

## 2017-07-11 DIAGNOSIS — L570 Actinic keratosis: Secondary | ICD-10-CM | POA: Diagnosis not present

## 2017-07-11 DIAGNOSIS — L821 Other seborrheic keratosis: Secondary | ICD-10-CM | POA: Diagnosis not present

## 2017-08-20 DIAGNOSIS — C439 Malignant melanoma of skin, unspecified: Secondary | ICD-10-CM | POA: Diagnosis not present

## 2017-08-22 ENCOUNTER — Telehealth: Payer: Self-pay | Admitting: Internal Medicine

## 2017-08-22 MED ORDER — AMOXICILLIN-POT CLAVULANATE 875-125 MG PO TABS: 1.0000 | ORAL_TABLET | Freq: Two times a day (BID) | ORAL | 0 refills | Status: DC

## 2017-08-22 NOTE — Telephone Encounter (Signed)
The schedule is incorrect. I am with him now working in the [facility].

## 2017-08-22 NOTE — Telephone Encounter (Signed)
Forwarding to Dr. Henrene Pastor. He is working today.

## 2017-08-22 NOTE — Telephone Encounter (Signed)
Dr. Fuller Plan according to the schedule Dr. Henrene Pastor is off. Please advise.

## 2017-08-22 NOTE — Telephone Encounter (Signed)
Spoke with pt and he is aware. Script sent to pharmacy. 

## 2017-08-22 NOTE — Telephone Encounter (Signed)
Nicholas Bird pt calling c/o abdominal pain about 4-6" below his belly button and centered in the abdomen. States the pain started about 4 days ago, he has not had a fever. Feels he is having a diverticulitis flare and is requesting some medication be called in for him. Dr. Fuller Plan as dod please advise. Pt had CT by Urology back in May/June that showed diverticulitis, pt thinks he was treated with amoxicillin and got better.

## 2017-08-22 NOTE — Telephone Encounter (Signed)
Empirically prescribe Augmentin 850 mg 2 times a day for 7 days. Have him see an advanced practitioner for check up next week. Of course, if he worsens in the interim he should seek medical attention at the emergency room.thank you

## 2017-09-11 ENCOUNTER — Telehealth: Payer: Self-pay | Admitting: Endocrinology

## 2017-09-11 ENCOUNTER — Other Ambulatory Visit: Payer: Self-pay

## 2017-09-11 MED ORDER — ALLOPURINOL 300 MG PO TABS: 300.0000 mg | ORAL_TABLET | Freq: Every day | ORAL | 0 refills | Status: DC

## 2017-09-11 NOTE — Telephone Encounter (Signed)
Patient needs script for Alloturinol (Generic for Zyloprim) 300 mg-6-12 month supply sent to [facility]. 1 tablet per day.

## 2017-09-11 NOTE — Telephone Encounter (Signed)
Done

## 2017-09-16 ENCOUNTER — Other Ambulatory Visit: Payer: Self-pay | Admitting: Internal Medicine

## 2017-09-16 MED ORDER — CANDESARTAN CILEXETIL-HCTZ 16-12.5 MG PO TABS: 1.0000 | ORAL_TABLET | Freq: Every day | ORAL | 5 refills | Status: DC

## 2017-10-07 ENCOUNTER — Other Ambulatory Visit: Payer: Self-pay | Admitting: *Deleted

## 2017-10-07 MED ORDER — APIXABAN 5 MG PO TABS: 5.0000 mg | ORAL_TABLET | Freq: Two times a day (BID) | ORAL | 6 refills | Status: DC

## 2017-10-30 DIAGNOSIS — R69 Illness, unspecified: Secondary | ICD-10-CM | POA: Diagnosis not present

## 2017-11-11 DIAGNOSIS — R972 Elevated prostate specific antigen [PSA]: Secondary | ICD-10-CM | POA: Diagnosis not present

## 2017-11-11 DIAGNOSIS — R351 Nocturia: Secondary | ICD-10-CM | POA: Diagnosis not present

## 2017-11-11 DIAGNOSIS — N323 Diverticulum of bladder: Secondary | ICD-10-CM | POA: Diagnosis not present

## 2017-11-11 DIAGNOSIS — R310 Gross hematuria: Secondary | ICD-10-CM | POA: Diagnosis not present

## 2017-11-11 DIAGNOSIS — N401 Enlarged prostate with lower urinary tract symptoms: Secondary | ICD-10-CM | POA: Diagnosis not present

## 2017-11-15 ENCOUNTER — Other Ambulatory Visit: Payer: Self-pay | Admitting: Endocrinology

## 2017-11-15 ENCOUNTER — Ambulatory Visit (INDEPENDENT_AMBULATORY_CARE_PROVIDER_SITE_OTHER): Payer: Medicare HMO | Admitting: Endocrinology

## 2017-11-15 ENCOUNTER — Other Ambulatory Visit (INDEPENDENT_AMBULATORY_CARE_PROVIDER_SITE_OTHER): Payer: Medicare HMO

## 2017-11-15 ENCOUNTER — Encounter: Payer: Self-pay | Admitting: Endocrinology

## 2017-11-15 VITALS — BP 122/62 | HR 81 | Wt 196.4 lb

## 2017-11-15 DIAGNOSIS — K219 Gastro-esophageal reflux disease without esophagitis: Secondary | ICD-10-CM

## 2017-11-15 DIAGNOSIS — Z125 Encounter for screening for malignant neoplasm of prostate: Secondary | ICD-10-CM | POA: Diagnosis not present

## 2017-11-15 DIAGNOSIS — K7030 Alcoholic cirrhosis of liver without ascites: Secondary | ICD-10-CM

## 2017-11-15 DIAGNOSIS — G629 Polyneuropathy, unspecified: Secondary | ICD-10-CM

## 2017-11-15 DIAGNOSIS — E785 Hyperlipidemia, unspecified: Secondary | ICD-10-CM

## 2017-11-15 DIAGNOSIS — M1A9XX0 Chronic gout, unspecified, without tophus (tophi): Secondary | ICD-10-CM

## 2017-11-15 DIAGNOSIS — I10 Essential (primary) hypertension: Secondary | ICD-10-CM

## 2017-11-15 DIAGNOSIS — R69 Illness, unspecified: Secondary | ICD-10-CM | POA: Diagnosis not present

## 2017-11-15 LAB — BASIC METABOLIC PANEL
BUN: 19 mg/dL (ref 6–23)
CALCIUM: 9.7 mg/dL (ref 8.4–10.5)
CO2: 28 mEq/L (ref 19–32)
Chloride: 98 mEq/L (ref 96–112)
Creatinine, Ser: 1.01 mg/dL (ref 0.40–1.50)
GFR: 76.32 mL/min (ref >60.00)
Glucose, Bld: 128 mg/dL — ABNORMAL HIGH (ref 70–99)
POTASSIUM: 4.3 meq/L (ref 3.5–5.1)
SODIUM: 132 meq/L — AB (ref 135–145)

## 2017-11-15 LAB — CBC WITH DIFFERENTIAL/PLATELET
Basophils Absolute: 0 10*3/uL (ref 0.0–0.1)
Basophils Relative: 1 % (ref 0.0–3.0)
EOS PCT: 2 % (ref 0.0–5.0)
Eosinophils Absolute: 0.1 10*3/uL (ref 0.0–0.7)
HCT: 33.1 % — ABNORMAL LOW (ref 39.0–52.0)
Hemoglobin: 11 g/dL — ABNORMAL LOW (ref 13.0–17.0)
LYMPHS ABS: 1.4 10*3/uL (ref 0.7–4.0)
Lymphocytes Relative: 35.2 % (ref 12.0–46.0)
MCHC: 33.1 g/dL (ref 30.0–36.0)
MCV: 95.1 fl (ref 78.0–100.0)
MONOS PCT: 16.2 % — AB (ref 3.0–12.0)
Monocytes Absolute: 0.6 10*3/uL (ref 0.1–1.0)
NEUTROS ABS: 1.8 10*3/uL (ref 1.4–7.7)
NEUTROS PCT: 45.6 % (ref 43.0–77.0)
PLATELETS: 264 10*3/uL (ref 150.0–400.0)
RBC: 3.48 Mil/uL — ABNORMAL LOW (ref 4.22–5.81)
RDW: 14.4 % (ref 11.5–15.5)
WBC: 3.9 10*3/uL — ABNORMAL LOW (ref 4.0–10.5)

## 2017-11-15 LAB — TSH: TSH: 2.19 u[IU]/mL (ref 0.35–4.50)

## 2017-11-15 LAB — HEPATIC FUNCTION PANEL
ALBUMIN: 4.4 g/dL (ref 3.5–5.2)
ALT: 16 U/L (ref 0–53)
AST: 20 U/L (ref 0–37)
Alkaline Phosphatase: 44 U/L (ref 39–117)
Bilirubin, Direct: 0.2 mg/dL (ref 0.0–0.3)
Total Bilirubin: 0.7 mg/dL (ref 0.2–1.2)
Total Protein: 6.7 g/dL (ref 6.0–8.3)

## 2017-11-15 LAB — LIPID PANEL
CHOL/HDL RATIO: 4
Cholesterol: 175 mg/dL (ref 0–200)
HDL: 47.1 mg/dL (ref >39.00)
LDL Cholesterol: 96 mg/dL (ref 0–99)
NonHDL: 127.6
TRIGLYCERIDES: 159 mg/dL — AB (ref 0.0–149.0)
VLDL: 31.8 mg/dL (ref 0.0–40.0)

## 2017-11-15 LAB — URIC ACID: Uric Acid, Serum: 3.4 mg/dL — ABNORMAL LOW (ref 4.0–7.8)

## 2017-11-15 LAB — PSA: PSA: 1.49 ng/mL (ref 0.10–4.00)

## 2017-11-15 LAB — VITAMIN B12: VITAMIN B 12: 345 pg/mL (ref 211–911)

## 2017-11-15 NOTE — Progress Notes (Signed)
   Subjective:    Patient ID: Nicholas Bird, male    DOB: [birth year], 77 y.o.   MRN: [id number]  HPI    Review of Systems     Objective:   Physical Exam        Assessment & Plan:

## 2017-11-15 NOTE — Patient Instructions (Signed)
blood tests are requested for you today.  We'll let you know about the results. I'll see you next week.

## 2017-11-26 DIAGNOSIS — N323 Diverticulum of bladder: Secondary | ICD-10-CM | POA: Diagnosis not present

## 2017-12-05 ENCOUNTER — Encounter: Payer: Self-pay | Admitting: Endocrinology

## 2017-12-05 ENCOUNTER — Ambulatory Visit (INDEPENDENT_AMBULATORY_CARE_PROVIDER_SITE_OTHER): Payer: Medicare HMO | Admitting: Endocrinology

## 2017-12-05 VITALS — BP 130/80 | HR 65 | Temp 97.3°F | Ht 70.47 in | Wt 198.8 lb

## 2017-12-05 DIAGNOSIS — Z0000 Encounter for general adult medical examination without abnormal findings: Secondary | ICD-10-CM | POA: Diagnosis not present

## 2017-12-05 NOTE — Progress Notes (Signed)
we discussed code status.  pt requests full code, but would not want to be started or maintained on artificial life-support measures if there was not a reasonable chance of recovery 

## 2017-12-05 NOTE — Progress Notes (Signed)
Subjective:    Patient ID: Nicholas Bird, male    DOB: 1941/08/28, 77 y.o.   MRN: [id number]  HPI Pt is here for regular wellness examination, and is feeling pretty well in general, and says chronic med probs are stable, except as noted below Past Medical History:  Diagnosis Date  . Alcohol dependence ([place])   . Alcoholic cirrhosis of liver ([place])   . Anticoagulant long-term use   . Atrial fibrillation, permanent ([place])    [place]  . Bladder diverticulum   . BPH (benign prostatic hypertrophy)   . ED (erectile dysfunction) of organic origin   . Elevated LFTs    CHRONIC  . Gall stones   . Gout    per pt 12-09-2013  gout is stable  . Hematuria   . History of pancreatitis    10/2012--  resolved   . History of TIA (transient ischemic attack)    04/2009--  no residual  . Hyperlipemia   . Hypertension   . Liver disease   . Personal history of colonic polyps 07/12/2008   TUBULAR ADENOMA  . Rosacea     Past Surgical History:  Procedure Laterality Date  . CARDIAC CATHETERIZATION  09-19-1998   false positive stress test---  normal cath and preserved lvf  . CATARACT EXTRACTION W/ INTRAOCULAR LENS  IMPLANT, BILATERAL Bilateral 2008  . CYSTOSCOPY WITH BIOPSY N/A 12/14/2013   Procedure: CYSTOSCOPY WITH BIOPSY;  Surgeon: Franchot Gallo, MD;  Location: [facility];  Service: Urology;  Laterality: N/A;  . INGUINAL HERNIA REPAIR Left 01-14-2001  . LAPAROSCOPIC CHOLECYSTECTOMY  10-21-2008   AND LIVER BX'S  . LAPAROSCOPIC INGUINAL HERNIA REPAIR Bilateral 05-22-2009    Social History   Socioeconomic History  . Marital status: Married    Spouse name: Not on file  . Number of children: 2  . Years of education: Not on file  . Highest education level: Not on file  Occupational History  . Occupation: retired    Fish farm manager: RETIRED  Social Needs  . Financial resource strain: Not on file  . Food insecurity:    Worry: Not on file    Inability: Not on file    . Transportation needs:    Medical: Not on file    Non-medical: Not on file  Tobacco Use  . Smoking status: Never Smoker  . Smokeless tobacco: Never Used  Substance and Sexual Activity  . Alcohol use: Yes    Alcohol/week: 8.4 oz    Types: 14 Shots of liquor per week    Comment: 3 oz. per day  . Drug use: No  . Sexual activity: Not on file  Lifestyle  . Physical activity:    Days per week: Not on file    Minutes per session: Not on file  . Stress: Not on file  Relationships  . Social connections:    Talks on phone: Not on file    Gets together: Not on file    Attends religious service: Not on file    Active member of club or organization: Not on file    Attends meetings of clubs or organizations: Not on file    Relationship status: Not on file  . Intimate partner violence:    Fear of current or ex partner: Not on file    Emotionally abused: Not on file    Physically abused: Not on file    Forced sexual activity: Not on file  Other Topics Concern  . Not  on file  Social History Narrative  . Not on file    Current Outpatient Medications on File Prior to Visit  Medication Sig Dispense Refill  . allopurinol (ZYLOPRIM) 300 MG tablet Take 1 tablet (300 mg total) by mouth daily. 180 tablet 0  . apixaban (ELIQUIS) 5 MG TABS tablet Take 1 tablet (5 mg total) by mouth 2 (two) times daily. 60 tablet 6  . candesartan-hydrochlorothiazide (ATACAND HCT) 16-12.5 MG tablet Take 1 tablet by mouth daily. 30 tablet 5  . finasteride (PROSCAR) 5 MG tablet Take 5 mg by mouth.    . metoprolol succinate (TOPROL-XL) 50 MG 24 hr tablet take 1 tablet by mouth once daily TAKE WITH OR IMMEDIATELY OR FOLLOWING A MEAL 30 tablet 11  . Multiple Vitamin (MULTI-VITAMINS) TABS Take 1 tablet by mouth daily.      No current facility-administered medications on file prior to visit.     No Known Allergies  Family History  Problem Relation Age of Onset  . Lung cancer Mother   . Skin cancer Sister   .  Colon cancer Neg Hx     BP 130/80 (BP Location: Left Arm, Patient Position: Sitting, Cuff Size: Normal)   Pulse 65   Temp (!) 97.3 F (36.3 C) (Oral)   Ht 5' 10.47" (1.79 m)   Wt 198 lb 12.8 oz (90.2 kg)   SpO2 97%   BMI 28.15 kg/m     Review of Systems Denies fever, fatigue, diplopia, earache, chest pain, sob, back pain, decreased urinary stream, cold intolerance, BRBPR, hematuria, syncope, numbness, allergy sxs, easy bruising, and rash. He has easy bruising    Objective:   Physical Exam VS: see vs page GEN: no distress HEAD: head: no deformity eyes: no periorbital swelling, no proptosis external nose and ears are normal mouth: no lesion seen NECK: supple, thyroid is not enlarged. Old healed surgical scar at the left lat neck.  CHEST WALL: no deformity LUNGS: clear to auscultation BREASTS:  No gynecomastia CV: reg rate and rhythm, no murmur.  ABD: abdomen is soft, nontender.  no hepatosplenomegaly.  not distended.  no hernia.  RECTAL/PROSTATE: sees urology MUSCULOSKELETAL: muscle bulk and strength are grossly normal.  no obvious joint swelling.  gait is normal and steady EXTEMITIES: no deformity.  no ulcer on the feet.  feet are of normal color and temp.  Trace bilat leg edema, and bilat vv's. There is bilateral onychomycosis of the toenails PULSES: dorsalis pedis intact bilat.  no carotid bruit NEURO:  cn 2-12 grossly intact.   readily moves all 4's.  sensation is intact to touch on the feet SKIN:  Normal texture and temperature.  No rash or suspicious lesion is visible.   NODES:  None palpable at the neck PSYCH: alert, well-oriented.  Does not appear anxious nor depressed.       Assessment & Plan:  Wellness visit today, with problems stable, except as noted.   Subjective:   Patient here for Medicare annual wellness visit and management of other chronic and acute problems.     Risk factors: advanced age    35 of Physicians Providing Medical Care to Patient:   See "snapshot"   Activities of Daily Living: In your present state of health, do you have any difficulty performing the following activities (lives with wife)?:  Preparing food and eating?: No  Bathing yourself: No  Getting dressed: No  Using the toilet:No  Moving around from place to place: No  In the past year have  you fallen or had a near fall?:No    Home Safety: Has smoke detector and wears seat belts. Firearms are safely stored. No excess sun exposure.   Opioid Use: none   Diet and Exercise  Current exercise habits: pt says good Dietary issues discussed: pt reports a healthy diet   Depression Screen  Q1: Over the past two weeks, have you felt down, depressed or hopeless?no  Q2: Over the past two weeks, have you felt little interest or pleasure in doing things? no   The following portions of the patient's history were reviewed and updated as appropriate: allergies, current medications, past family history, past medical history, past social history, past surgical history and problem list.   Review of Systems  Denies hearing loss, and visual loss Objective:   Vision:  Advertising account executive, so he declines VA today Hearing: grossly normal Body mass index:  See vs page Msk: pt easily and quickly performs "get-up-and-go" from a sitting position Cognitive Impairment Assessment: cognition, memory and judgment appear normal.  remembers 2/3 at 5 minutes.  excellent recall.  can easily read and write a sentence.  alert and oriented x 3.     Assessment:   Medicare wellness utd on preventive parameters    Plan:   During the course of the visit the patient was educated and counseled about appropriate screening and preventive services including:        Fall prevention is advised today   Diabetes screening is done today Nutrition counseling is offered  advanced directives/end of life addressed today:  see healthcare directives hyperlink  Vaccines are updated as needed  Patient  Instructions (the written plan) was given to the patient.

## 2017-12-05 NOTE — Patient Instructions (Signed)
Please consider these measures for your health:  minimize alcohol.  Do not use tobacco products.  Have a colonoscopy at least every 10 years from age 77.  Keep firearms safely stored.  Always use seat belts.  have working smoke alarms in your home.  See an eye doctor and dentist regularly.  Never drive under the influence of alcohol or drugs (including prescription drugs).  Those with fair skin should take precautions against the sun, and should carefully examine their skin once per month, for any new or changed moles. It is critically important to prevent falling down (keep floor areas well-lit, dry, and free of loose objects.  If you have a cane, walker, or wheelchair, you should use it, even for short trips around the house.  Wear flat-soled shoes.  Also, try not to rush) Please continue the same medications Please come back for a follow-up appointment in 1 year.

## 2018-01-06 DIAGNOSIS — Z85820 Personal history of malignant melanoma of skin: Secondary | ICD-10-CM | POA: Diagnosis not present

## 2018-01-06 DIAGNOSIS — L718 Other rosacea: Secondary | ICD-10-CM | POA: Diagnosis not present

## 2018-01-06 DIAGNOSIS — L570 Actinic keratosis: Secondary | ICD-10-CM | POA: Diagnosis not present

## 2018-01-06 DIAGNOSIS — Z85828 Personal history of other malignant neoplasm of skin: Secondary | ICD-10-CM | POA: Diagnosis not present

## 2018-01-06 DIAGNOSIS — L720 Epidermal cyst: Secondary | ICD-10-CM | POA: Diagnosis not present

## 2018-01-06 DIAGNOSIS — L812 Freckles: Secondary | ICD-10-CM | POA: Diagnosis not present

## 2018-01-06 DIAGNOSIS — L821 Other seborrheic keratosis: Secondary | ICD-10-CM | POA: Diagnosis not present

## 2018-01-06 DIAGNOSIS — D225 Melanocytic nevi of trunk: Secondary | ICD-10-CM | POA: Diagnosis not present

## 2018-01-06 DIAGNOSIS — D1801 Hemangioma of skin and subcutaneous tissue: Secondary | ICD-10-CM | POA: Diagnosis not present

## 2018-01-17 ENCOUNTER — Encounter: Payer: Self-pay | Admitting: Internal Medicine

## 2018-01-29 ENCOUNTER — Other Ambulatory Visit: Payer: Self-pay | Admitting: Internal Medicine

## 2018-01-29 MED ORDER — CANDESARTAN CILEXETIL-HCTZ 16-12.5 MG PO TABS: 1.0000 | ORAL_TABLET | Freq: Every day | ORAL | 0 refills | Status: DC

## 2018-02-07 ENCOUNTER — Ambulatory Visit: Payer: Medicare HMO | Admitting: Internal Medicine

## 2018-02-07 ENCOUNTER — Encounter: Payer: Self-pay | Admitting: Internal Medicine

## 2018-02-07 VITALS — BP 130/66 | HR 63 | Ht 71.0 in | Wt 198.4 lb

## 2018-02-07 DIAGNOSIS — I10 Essential (primary) hypertension: Secondary | ICD-10-CM

## 2018-02-07 DIAGNOSIS — I482 Chronic atrial fibrillation: Secondary | ICD-10-CM | POA: Diagnosis not present

## 2018-02-07 DIAGNOSIS — I4821 Permanent atrial fibrillation: Secondary | ICD-10-CM

## 2018-02-07 DIAGNOSIS — R001 Bradycardia, unspecified: Secondary | ICD-10-CM

## 2018-02-07 LAB — FERRITIN: Ferritin: 12 ng/mL — ABNORMAL LOW (ref 30–400)

## 2018-02-07 LAB — CBC WITH DIFFERENTIAL/PLATELET
Basophils Absolute: 0 10*3/uL (ref 0.0–0.2)
Basos: 1 %
EOS (ABSOLUTE): 0.2 10*3/uL (ref 0.0–0.4)
EOS: 3 %
Hematocrit: 33.7 % — ABNORMAL LOW (ref 37.5–51.0)
Hemoglobin: 10.9 g/dL — ABNORMAL LOW (ref 13.0–17.7)
IMMATURE GRANULOCYTES: 0 %
Immature Grans (Abs): 0 10*3/uL (ref 0.0–0.1)
Lymphocytes Absolute: 1.4 10*3/uL (ref 0.7–3.1)
Lymphs: 28 %
MCH: 26.6 pg (ref 26.6–33.0)
MCHC: 32.3 g/dL (ref 31.5–35.7)
MCV: 82 fL (ref 79–97)
MONOS ABS: 0.8 10*3/uL (ref 0.1–0.9)
Monocytes: 15 %
Neutrophils Absolute: 2.7 10*3/uL (ref 1.4–7.0)
Neutrophils: 53 %
PLATELETS: 295 10*3/uL (ref 150–450)
RBC: 4.1 x10E6/uL — AB (ref 4.14–5.80)
RDW: 16.1 % — AB (ref 12.3–15.4)
WBC: 5 10*3/uL (ref 3.4–10.8)

## 2018-02-07 NOTE — Progress Notes (Signed)
HPI  Nicholas Bird is a 77 y.o. male seen in followup for atrial fibrillation which is permanent. He had been doing very well until April 15 2009 at which time he developed transient aphasia . It was felt that this was a TIA. There is apparently some lack of consensus as to whether this was a thromboembolic episode or not.   We ultimately decided to put him on oral anticoagulation therapy. The Coumadin is quite a nuisance.He thus ended up on Pradaxa. GI symptoms however intervened and he was changed to  Rivaroxaban >> apixoban   Date Cr Hgb  3/18 0.93 14.5  3/19  1.01 11.0   Has known urological bleeding  Apparently stool is guaic neg  The patient denies chest pain, shortness of breath, nocturnal dyspnea, orthopnea or peripheral edema.  There have been no palpitations, lightheadedness or syncope.        Past Medical History:  Diagnosis Date  . Alcohol dependence ([place])   . Alcoholic cirrhosis of liver ([place])   . Anticoagulant long-term use   . Atrial fibrillation, permanent ([place])    [place]  . Bladder diverticulum   . BPH (benign prostatic hypertrophy)   . ED (erectile dysfunction) of organic origin   . Elevated LFTs    CHRONIC  . Gall stones   . Gout    per pt 12-09-2013  gout is stable  . Hematuria   . History of pancreatitis    10/2012--  resolved   . History of TIA (transient ischemic attack)    04/2009--  no residual  . Hyperlipemia   . Hypertension   . Liver disease   . Personal history of colonic polyps 07/12/2008   TUBULAR ADENOMA  . Rosacea     Past Surgical History:  Procedure Laterality Date  . CARDIAC CATHETERIZATION  09-19-1998   false positive stress test---  normal cath and preserved lvf  . CATARACT EXTRACTION W/ INTRAOCULAR LENS  IMPLANT, BILATERAL Bilateral 2008  . CYSTOSCOPY WITH BIOPSY N/A 12/14/2013   Procedure: CYSTOSCOPY WITH BIOPSY;  Surgeon: Franchot Gallo, MD;  Location: [facility];  Service: Urology;   Laterality: N/A;  . INGUINAL HERNIA REPAIR Left 01-14-2001  . LAPAROSCOPIC CHOLECYSTECTOMY  10-21-2008   AND LIVER BX'S  . LAPAROSCOPIC INGUINAL HERNIA REPAIR Bilateral 05-22-2009    Current Outpatient Medications  Medication Sig Dispense Refill  . allopurinol (ZYLOPRIM) 300 MG tablet Take 1 tablet (300 mg total) by mouth daily. 180 tablet 0  . apixaban (ELIQUIS) 5 MG TABS tablet Take 1 tablet (5 mg total) by mouth 2 (two) times daily. 60 tablet 6  . candesartan-hydrochlorothiazide (ATACAND HCT) 16-12.5 MG tablet Take 1 tablet by mouth daily. Please keep upcoming appt in June with Dr. Caryl Comes for future refills. Thank you 30 tablet 0  . finasteride (PROSCAR) 5 MG tablet Take 5 mg by mouth.    . metoprolol succinate (TOPROL-XL) 50 MG 24 hr tablet take 1 tablet by mouth once daily TAKE WITH OR IMMEDIATELY OR FOLLOWING A MEAL 30 tablet 11  . Multiple Vitamin (MULTI-VITAMINS) TABS Take 1 tablet by mouth daily.      No current facility-administered medications for this visit.     No Known Allergies  Review of Systems negative except from HPI and PMH  Physical Exam BP 130/66   Pulse 63   Ht 5\' 11"  (1.803 m)   Wt 198 lb 6.4 oz (90 kg)   SpO2 99%   BMI 27.67 kg/m  Well developed and nourished in no acute distress HENT normal Neck supple with JVP-flat Carotids brisk and full without bruits Clear Irregularly irregular rate and rhythm with controllee ventricular response, no murmurs or gallops Abd-soft with active BS without hepatomegaly No Clubbing cyanosis edema Skin-warm and dry A & Oriented  Grossly normal sensory and motor function   ECG AFib 63 -/10/44  Assessment and  Plan  Atrial fibrillation  Prior TIA  Bradycardia  Hypertension  Anemia   BP well controlled  Anemia is of concern as trajectory unclear and it is his impression that while urological bleeding is present and it may be more frequent it is no more voluminious  Is there another cause  Will check  Hgb and Ferritin  No symptomatic bradycardia

## 2018-02-07 NOTE — Patient Instructions (Signed)
Medication Instructions:  Your physician recommends that you continue on your current medications as directed. Please refer to the Current Medication list given to you today.  Labwork: You will have labs drawn today: CBC and [place]  Testing/Procedures: None ordered.  Follow-Up: Your physician wants you to follow-up in: One Year with Dr Caryl Comes. You will receive a reminder letter in the mail two months in advance. If you don't receive a letter, please call our office to schedule the follow-up appointment.   Any Other Special Instructions Will Be Listed Below (If Applicable).     If you need a refill on your cardiac medications before your next appointment, please call your pharmacy.

## 2018-02-13 ENCOUNTER — Encounter: Payer: Self-pay | Admitting: Endocrinology

## 2018-02-13 ENCOUNTER — Telehealth: Payer: Self-pay | Admitting: Internal Medicine

## 2018-02-13 NOTE — Telephone Encounter (Signed)
See lab work result comments.

## 2018-02-13 NOTE — Telephone Encounter (Signed)
New message    Pt is calling asking for a call back about his lab work.

## 2018-02-17 ENCOUNTER — Telehealth: Payer: Self-pay | Admitting: Endocrinology

## 2018-02-17 NOTE — Telephone Encounter (Signed)
Patient stated that his Cardiology Dr Virl Axe said that Loanne Drilling need to be put on a Iron supplement. please

## 2018-02-18 NOTE — Telephone Encounter (Signed)
I called patient & LVM for him to call back in regards to questions about taking iron pill. I also asked that he call back to schedule follow up for one month.

## 2018-02-18 NOTE — Telephone Encounter (Signed)
Please start iron, non-prescription, 1 pill twice per day.  Please come back for a follow-up appointment in 1 month.

## 2018-02-25 ENCOUNTER — Other Ambulatory Visit: Payer: Self-pay | Admitting: Internal Medicine

## 2018-02-25 DIAGNOSIS — C439 Malignant melanoma of skin, unspecified: Secondary | ICD-10-CM | POA: Diagnosis not present

## 2018-02-25 DIAGNOSIS — I517 Cardiomegaly: Secondary | ICD-10-CM | POA: Diagnosis not present

## 2018-02-25 DIAGNOSIS — R918 Other nonspecific abnormal finding of lung field: Secondary | ICD-10-CM | POA: Diagnosis not present

## 2018-02-25 MED ORDER — METOPROLOL SUCCINATE ER 50 MG PO TB24: ORAL_TABLET | ORAL | 3 refills | Status: DC

## 2018-02-25 NOTE — Telephone Encounter (Signed)
Pt's medication was sent to pt's pharmacy as requested. Confirmation received.  °

## 2018-02-26 DIAGNOSIS — C434 Malignant melanoma of scalp and neck: Secondary | ICD-10-CM | POA: Diagnosis not present

## 2018-03-20 ENCOUNTER — Telehealth: Payer: Self-pay | Admitting: Internal Medicine

## 2018-03-20 MED ORDER — AMOXICILLIN-POT CLAVULANATE 875-125 MG PO TABS: 1.0000 | ORAL_TABLET | Freq: Two times a day (BID) | ORAL | 0 refills | Status: DC

## 2018-03-20 NOTE — Telephone Encounter (Signed)
Script sent to pharmacy, patient aware.

## 2018-03-27 ENCOUNTER — Other Ambulatory Visit: Payer: Self-pay

## 2018-03-27 MED ORDER — APIXABAN 5 MG PO TABS: 5.0000 mg | ORAL_TABLET | Freq: Two times a day (BID) | ORAL | 6 refills | Status: DC

## 2018-03-27 NOTE — Telephone Encounter (Signed)
Eliquis 5mg  refill request received; pt is 77 yrs old, wt-90kg, Crea-1.01 on 11/15/17, last seen by Dr. Caryl Comes on 02/07/18; will send in refill to requested pharmacy.

## 2018-04-16 ENCOUNTER — Ambulatory Visit (INDEPENDENT_AMBULATORY_CARE_PROVIDER_SITE_OTHER): Payer: Medicare HMO | Admitting: Endocrinology

## 2018-04-16 ENCOUNTER — Encounter: Payer: Self-pay | Admitting: Endocrinology

## 2018-04-16 DIAGNOSIS — D509 Iron deficiency anemia, unspecified: Secondary | ICD-10-CM | POA: Insufficient documentation

## 2018-04-16 LAB — CBC WITH DIFFERENTIAL/PLATELET
Basophils Absolute: 0 10*3/uL (ref 0.0–0.1)
Basophils Relative: 0.8 % (ref 0.0–3.0)
EOS PCT: 2 % (ref 0.0–5.0)
Eosinophils Absolute: 0.1 10*3/uL (ref 0.0–0.7)
HCT: 39.3 % (ref 39.0–52.0)
HEMOGLOBIN: 13.2 g/dL (ref 13.0–17.0)
Lymphocytes Relative: 16.2 % (ref 12.0–46.0)
Lymphs Abs: 0.9 10*3/uL (ref 0.7–4.0)
MCHC: 33.6 g/dL (ref 30.0–36.0)
MCV: 97.9 fl (ref 78.0–100.0)
MONO ABS: 0.6 10*3/uL (ref 0.1–1.0)
MONOS PCT: 9.7 % (ref 3.0–12.0)
Neutro Abs: 4.1 10*3/uL (ref 1.4–7.7)
Neutrophils Relative %: 71.3 % (ref 43.0–77.0)
Platelets: 218 10*3/uL (ref 150.0–400.0)
RBC: 4.01 Mil/uL — ABNORMAL LOW (ref 4.22–5.81)
RDW: 24.5 % — ABNORMAL HIGH (ref 11.5–15.5)
WBC: 5.7 10*3/uL (ref 4.0–10.5)

## 2018-04-16 LAB — IBC PANEL
IRON: 86 ug/dL (ref 42–165)
Saturation Ratios: 21.7 % (ref 20.0–50.0)
Transferrin: 283 mg/dL (ref 212.0–360.0)

## 2018-04-16 NOTE — Progress Notes (Signed)
Subjective:    Patient ID: Nicholas Bird, male    DOB: June 07, 1941, 77 y.o.   MRN: [id number]  HPI fe-def anemia was noted in early 2019.  Since on fe 1-BID, pt states he feels better in general.  He has slight bleeding from the urethra (sees urol), but no assoc pain Past Medical History:  Diagnosis Date  . Alcohol dependence ([place])   . Alcoholic cirrhosis of liver ([place])   . Anticoagulant long-term use   . Atrial fibrillation, permanent ([place])    [place]  . Bladder diverticulum   . BPH (benign prostatic hypertrophy)   . ED (erectile dysfunction) of organic origin   . Elevated LFTs    CHRONIC  . Gall stones   . Gout    per pt 12-09-2013  gout is stable  . Hematuria   . History of pancreatitis    10/2012--  resolved   . History of TIA (transient ischemic attack)    04/2009--  no residual  . Hyperlipemia   . Hypertension   . Liver disease   . Personal history of colonic polyps 07/12/2008   TUBULAR ADENOMA  . Rosacea     Past Surgical History:  Procedure Laterality Date  . CARDIAC CATHETERIZATION  09-19-1998   false positive stress test---  normal cath and preserved lvf  . CATARACT EXTRACTION W/ INTRAOCULAR LENS  IMPLANT, BILATERAL Bilateral 2008  . CYSTOSCOPY WITH BIOPSY N/A 12/14/2013   Procedure: CYSTOSCOPY WITH BIOPSY;  Surgeon: Franchot Gallo, MD;  Location: [facility];  Service: Urology;  Laterality: N/A;  . INGUINAL HERNIA REPAIR Left 01-14-2001  . LAPAROSCOPIC CHOLECYSTECTOMY  10-21-2008   AND LIVER BX'S  . LAPAROSCOPIC INGUINAL HERNIA REPAIR Bilateral 05-22-2009    Social History   Socioeconomic History  . Marital status: Married    Spouse name: Not on file  . Number of children: 2  . Years of education: Not on file  . Highest education level: Not on file  Occupational History  . Occupation: retired    Fish farm manager: RETIRED  Social Needs  . Financial resource strain: Not on file  . Food insecurity:    Worry: Not on file   Inability: Not on file  . Transportation needs:    Medical: Not on file    Non-medical: Not on file  Tobacco Use  . Smoking status: Never Smoker  . Smokeless tobacco: Never Used  Substance and Sexual Activity  . Alcohol use: Yes    Alcohol/week: 14.0 standard drinks    Types: 14 Shots of liquor per week    Comment: 3 oz. per day  . Drug use: No  . Sexual activity: Not on file  Lifestyle  . Physical activity:    Days per week: Not on file    Minutes per session: Not on file  . Stress: Not on file  Relationships  . Social connections:    Talks on phone: Not on file    Gets together: Not on file    Attends religious service: Not on file    Active member of club or organization: Not on file    Attends meetings of clubs or organizations: Not on file    Relationship status: Not on file  . Intimate partner violence:    Fear of current or ex partner: Not on file    Emotionally abused: Not on file    Physically abused: Not on file    Forced sexual activity: Not on file  Other Topics Concern  . Not on file  Social History Narrative  . Not on file    Current Outpatient Medications on File Prior to Visit  Medication Sig Dispense Refill  . allopurinol (ZYLOPRIM) 300 MG tablet Take 1 tablet (300 mg total) by mouth daily. 180 tablet 0  . apixaban (ELIQUIS) 5 MG TABS tablet Take 1 tablet (5 mg total) by mouth 2 (two) times daily. 60 tablet 6  . candesartan-hydrochlorothiazide (ATACAND HCT) 16-12.5 MG tablet TAKE 1 TABLET BY MOUTH ONCE DAILY 90 tablet 3  . finasteride (PROSCAR) 5 MG tablet Take 5 mg by mouth.    . metoprolol succinate (TOPROL-XL) 50 MG 24 hr tablet take 1 tablet by mouth once daily TAKE WITH OR IMMEDIATELY OR FOLLOWING A MEAL 90 tablet 3  . Multiple Vitamin (MULTI-VITAMINS) TABS Take 1 tablet by mouth daily.      No current facility-administered medications on file prior to visit.     No Known Allergies  Family History  Problem Relation Age of Onset  . Lung  cancer Mother   . Skin cancer Sister   . Colon cancer Neg Hx     BP 128/72 (BP Location: Right Arm, Patient Position: Sitting, Cuff Size: Normal)   Pulse 60   Ht 5\' 11"  (1.803 m)   Wt 194 lb 9.6 oz (88.3 kg)   SpO2 98%   BMI 27.14 kg/m    Review of Systems He denies BRBPR    Objective:   Physical Exam VITAL SIGNS:  See vs page GENERAL: no distress Gait: normal and steady.      Assessment & Plan:  Anemia: due for f/u. uncertain etiology. AF: due to anticoagulation, he is at risk for recurrence  Patient Instructions  blood tests are requested for you today.  We'll let you know about the results. Here are some tests for blood in the bowels.  please follow the instructions, and return to the lab.

## 2018-04-16 NOTE — Patient Instructions (Signed)
blood tests are requested for you today.  We'll let you know about the results. Here are some tests for blood in the bowels.  please follow the instructions, and return to the lab.

## 2018-04-26 ENCOUNTER — Other Ambulatory Visit: Payer: Self-pay | Admitting: Endocrinology

## 2018-04-28 DIAGNOSIS — H40013 Open angle with borderline findings, low risk, bilateral: Secondary | ICD-10-CM | POA: Diagnosis not present

## 2018-05-13 ENCOUNTER — Encounter: Payer: Self-pay | Admitting: Family Medicine

## 2018-05-13 ENCOUNTER — Ambulatory Visit (INDEPENDENT_AMBULATORY_CARE_PROVIDER_SITE_OTHER): Payer: Medicare HMO | Admitting: Family Medicine

## 2018-05-13 DIAGNOSIS — I10 Essential (primary) hypertension: Secondary | ICD-10-CM

## 2018-05-13 DIAGNOSIS — I4891 Unspecified atrial fibrillation: Secondary | ICD-10-CM

## 2018-05-13 DIAGNOSIS — M1A9XX0 Chronic gout, unspecified, without tophus (tophi): Secondary | ICD-10-CM | POA: Diagnosis not present

## 2018-05-13 DIAGNOSIS — D509 Iron deficiency anemia, unspecified: Secondary | ICD-10-CM

## 2018-05-13 MED ORDER — ALLOPURINOL 300 MG PO TABS: 300.0000 mg | ORAL_TABLET | Freq: Every day | ORAL | 3 refills | Status: DC

## 2018-05-13 NOTE — Assessment & Plan Note (Signed)
Uric acid 3.4 on blood drawn earlier this year.  Will refill allopurinol 300 mg daily.  Recheck uric acid level in 1 year with next blood draw.

## 2018-05-13 NOTE — Assessment & Plan Note (Signed)
At goal. Continue candesartan-HCTZ 16-12.5mg  and metoprolol succinate 50mg  daily.

## 2018-05-13 NOTE — Assessment & Plan Note (Signed)
Stable. Follows with urology.

## 2018-05-13 NOTE — Patient Instructions (Signed)
It was very nice to see you today!  Please come back to see me next August for your wellness visit, or sooner as needed.  Keep up the good work!  Take care, Dr [name]

## 2018-05-13 NOTE — Progress Notes (Signed)
   Subjective:  Nicholas Bird is a 77 y.o. male who presents today with a chief complaint of gout and to transfer care to this office.   HPI:  Gout, chronic problem Several year history. Currently on allopurinol 300mg  daily. Tolerating well. No recent flares.  BPH/Hematuria Follows with urology for this.  Currently on finasteride 5 mg daily.  Still wakes up several times per night to urinate.  Reportedly has " dilated veins" on his bladder wall that causes his hematuria.  HTN/Afib Follows with cardiology for this.  Currently on Eliquis, 5mg  bid, metoprolol 50mg  qd, and candesartan-HCTZ 16-12.5mg  daily.  Tolerating well as well without side effects.  Central hematuria noted above, no gross bleeding.  Iron deficiency anemia Found on recent screening blood work.  Currently takes her sulfate 325 mg daily.  ROS: Per HPI  PMH: He reports that he has never smoked. He has never used smokeless tobacco. He reports that he drinks about 14.0 standard drinks of alcohol per week. He reports that he does not use drugs.  Objective:  Physical Exam: BP 126/74 (BP Location: Left Arm, Patient Position: Sitting, Cuff Size: Normal)   Pulse 76   Temp 97.9 F (36.6 C) (Oral)   Ht 5\' 11"  (1.803 m)   Wt 194 lb 9.6 oz (88.3 kg)   SpO2 99%   BMI 27.14 kg/m   Gen: NAD, resting comfortably CV: Irregular with no murmurs appreciated Pulm: NWOB, CTAB with no crackles, wheezes, or rhonchi  Assessment/Plan:  Gout Uric acid 3.4 on blood drawn earlier this year.  Will refill allopurinol 300 mg daily.  Recheck uric acid level in 1 year with next blood draw.  BENIGN PROSTATIC HYPERTROPHY, HX OF Stable. Follows with urology.   ATRIAL FIBRILLATION Stable. Rate controlled with metoprolol 50mg  daily. Anticoagulated with eliquis 5mg  bid.   HTN (hypertension) At goal. Continue candesartan-HCTZ 16-12.5mg  and metoprolol succinate 50mg  daily.   Anemia, iron deficiency Continue ferrous sulfate 325mg  daily. Likely  secondary to hematuria. Has stool cards pending. Recheck iron panel/CBC in 6-12 months.   Patient will follow up with me next year for his CPE.   Nicholas Bird. [name], MD 05/13/2018 3:30 PM

## 2018-05-13 NOTE — Assessment & Plan Note (Signed)
Stable. Rate controlled with metoprolol 50mg  daily. Anticoagulated with eliquis 5mg  bid.

## 2018-05-13 NOTE — Assessment & Plan Note (Signed)
Continue ferrous sulfate 325mg  daily. Likely secondary to hematuria. Has stool cards pending. Recheck iron panel/CBC in 6-12 months.

## 2018-06-04 DIAGNOSIS — R69 Illness, unspecified: Secondary | ICD-10-CM | POA: Diagnosis not present

## 2018-07-29 ENCOUNTER — Encounter: Payer: Self-pay | Admitting: Endocrinology

## 2018-07-29 DIAGNOSIS — L812 Freckles: Secondary | ICD-10-CM | POA: Diagnosis not present

## 2018-07-29 DIAGNOSIS — D485 Neoplasm of uncertain behavior of skin: Secondary | ICD-10-CM | POA: Diagnosis not present

## 2018-07-29 DIAGNOSIS — Z85820 Personal history of malignant melanoma of skin: Secondary | ICD-10-CM | POA: Diagnosis not present

## 2018-07-29 DIAGNOSIS — L853 Xerosis cutis: Secondary | ICD-10-CM | POA: Diagnosis not present

## 2018-07-29 DIAGNOSIS — D1801 Hemangioma of skin and subcutaneous tissue: Secondary | ICD-10-CM | POA: Diagnosis not present

## 2018-07-29 DIAGNOSIS — C44712 Basal cell carcinoma of skin of right lower limb, including hip: Secondary | ICD-10-CM | POA: Diagnosis not present

## 2018-07-29 DIAGNOSIS — L570 Actinic keratosis: Secondary | ICD-10-CM | POA: Diagnosis not present

## 2018-07-29 DIAGNOSIS — L821 Other seborrheic keratosis: Secondary | ICD-10-CM | POA: Diagnosis not present

## 2018-07-29 DIAGNOSIS — D225 Melanocytic nevi of trunk: Secondary | ICD-10-CM | POA: Diagnosis not present

## 2018-07-29 DIAGNOSIS — Z85828 Personal history of other malignant neoplasm of skin: Secondary | ICD-10-CM | POA: Diagnosis not present

## 2018-09-09 DIAGNOSIS — C434 Malignant melanoma of scalp and neck: Secondary | ICD-10-CM | POA: Diagnosis not present

## 2018-11-18 ENCOUNTER — Other Ambulatory Visit: Payer: Self-pay | Admitting: Internal Medicine

## 2018-11-18 NOTE — Telephone Encounter (Signed)
Age 78, weight 88.3kg, SCr from 11/15/17 of 1.01. Last seen 02/2018 by Dr Caryl Comes. Added note to upcoming visit in 02/2019 to recheck CBC and BMET.

## 2018-12-11 ENCOUNTER — Encounter: Payer: Medicare HMO | Admitting: Endocrinology

## 2019-01-19 DIAGNOSIS — L821 Other seborrheic keratosis: Secondary | ICD-10-CM | POA: Diagnosis not present

## 2019-01-19 DIAGNOSIS — D1801 Hemangioma of skin and subcutaneous tissue: Secondary | ICD-10-CM | POA: Diagnosis not present

## 2019-01-19 DIAGNOSIS — Z85828 Personal history of other malignant neoplasm of skin: Secondary | ICD-10-CM | POA: Diagnosis not present

## 2019-01-19 DIAGNOSIS — L853 Xerosis cutis: Secondary | ICD-10-CM | POA: Diagnosis not present

## 2019-01-19 DIAGNOSIS — Z85820 Personal history of malignant melanoma of skin: Secondary | ICD-10-CM | POA: Diagnosis not present

## 2019-01-19 DIAGNOSIS — L570 Actinic keratosis: Secondary | ICD-10-CM | POA: Diagnosis not present

## 2019-01-19 DIAGNOSIS — D485 Neoplasm of uncertain behavior of skin: Secondary | ICD-10-CM | POA: Diagnosis not present

## 2019-01-30 ENCOUNTER — Telehealth: Payer: Self-pay

## 2019-01-30 NOTE — Telephone Encounter (Signed)
I called patient about switching office visit to telehealth visit on 02/17/19.

## 2019-02-06 NOTE — Telephone Encounter (Signed)
I called patient, he is fine with switching office visit on 02/17/19 to a telehealth visit.      Virtual Visit Pre-Appointment Phone Call  "(Name), I am calling you today to discuss your upcoming appointment. We are currently trying to limit exposure to the virus that causes COVID-19 by seeing patients at home rather than in the office."  1. "What is the BEST phone number to call the day of the visit?" - include this in appointment notes  2. "Do you have or have access to (through a family member/friend) a smartphone with video capability that we can use for your visit?" a. If yes - list this number in appt notes as "cell" (if different from BEST phone #) and list the appointment type as a VIDEO visit in appointment notes b. If no - list the appointment type as a PHONE visit in appointment notes  3. Confirm consent - "In the setting of the current Covid19 crisis, you are scheduled for a (phone or video) visit with your provider on (date) at (time).  Just as we do with many in-office visits, in order for you to participate in this visit, we must obtain consent.  If you'd like, I can send this to your mychart (if signed up) or email for you to review.  Otherwise, I can obtain your verbal consent now.  All virtual visits are billed to your insurance company just like a normal visit would be.  By agreeing to a virtual visit, we'd like you to understand that the technology does not allow for your provider to perform an examination, and thus may limit your provider's ability to fully assess your condition. If your provider identifies any concerns that need to be evaluated in person, we will make arrangements to do so.  Finally, though the technology is pretty good, we cannot assure that it will always work on either your or our end, and in the setting of a video visit, we may have to convert it to a phone-only visit.  In either situation, we cannot ensure that we have a secure connection.  Are you willing  to proceed?" STAFF: Did the patient verbally acknowledge consent to telehealth visit? Document YES/NO here: YES  4. Advise patient to be prepared - "Two hours prior to your appointment, go ahead and check your blood pressure, pulse, oxygen saturation, and your weight (if you have the equipment to check those) and write them all down. When your visit starts, your provider will ask you for this information. If you have an Apple Watch or Kardia device, please plan to have heart rate information ready on the day of your appointment. Please have a pen and paper handy nearby the day of the visit as well."  5. Give patient instructions for MyChart download to smartphone OR Doximity/Doxy.me as below if video visit (depending on what platform provider is using)  6. Inform patient they will receive a phone call 15 minutes prior to their appointment time (may be from unknown caller ID) so they should be prepared to answer    TELEPHONE CALL NOTE  Nicholas Bird has been deemed a candidate for a follow-up tele-health visit to limit community exposure during the Covid-19 pandemic. I spoke with the patient via phone to ensure availability of phone/video source, confirm preferred email & phone number, and discuss instructions and expectations.  I reminded Nicholas Bird to be prepared with any vital sign and/or heart rhythm information that could potentially be obtained via home  monitoring, at the time of his visit. I reminded Nicholas Bird to expect a phone call prior to his visit.  Mady Haagensen, [place] 02/06/2019 8:53 AM   INSTRUCTIONS FOR DOWNLOADING THE MYCHART APP TO SMARTPHONE  - The patient must first make sure to have activated MyChart and know their login information - If Apple, go to [employer] and type in MyChart in the search bar and download the app. If Android, ask patient to go to [employer] and type in Advance in the search bar and download the app. The app is free but as with any other app  downloads, their phone may require them to verify saved payment information or Apple/Android password.  - The patient will need to then log into the app with their MyChart username and password, and select [facility] as their healthcare provider to link the account. When it is time for your visit, go to the MyChart app, find appointments, and click Begin Video Visit. Be sure to Select Allow for your device to access the Microphone and Camera for your visit. You will then be connected, and your provider will be with you shortly.  **If they have any issues connecting, or need assistance please contact MyChart service desk [phone])**  **If using a computer, in order to ensure the best quality for their visit they will need to use either of the following Internet Browsers: [employer], or Google Chrome**  IF USING DOXIMITY or DOXY.ME - The patient will receive a link just prior to their visit by text.     FULL LENGTH CONSENT FOR TELE-HEALTH VISIT   I hereby voluntarily request, consent and authorize [facility] and its employed or contracted physicians, physician assistants, nurse practitioners or other licensed health care professionals (the Practitioner), to provide me with telemedicine health care services (the "Services") as deemed necessary by the treating Practitioner. I acknowledge and consent to receive the Services by the Practitioner via telemedicine. I understand that the telemedicine visit will involve communicating with the Practitioner through live audiovisual communication technology and the disclosure of certain medical information by electronic transmission. I acknowledge that I have been given the opportunity to request an in-person assessment or other available alternative prior to the telemedicine visit and am voluntarily participating in the telemedicine visit.  I understand that I have the right to withhold or withdraw my consent to the use of telemedicine  in the course of my care at any time, without affecting my right to future care or treatment, and that the Practitioner or I may terminate the telemedicine visit at any time. I understand that I have the right to inspect all information obtained and/or recorded in the course of the telemedicine visit and may receive copies of available information for a reasonable fee.  I understand that some of the potential risks of receiving the Services via telemedicine include:  Marland Kitchen Delay or interruption in medical evaluation due to technological equipment failure or disruption; . Information transmitted may not be sufficient (e.g. poor resolution of images) to allow for appropriate medical decision making by the Practitioner; and/or  . In rare instances, security protocols could fail, causing a breach of personal health information.  Furthermore, I acknowledge that it is my responsibility to provide information about my medical history, conditions and care that is complete and accurate to the best of my ability. I acknowledge that Practitioner's advice, recommendations, and/or decision may be based on factors not within their control, such as incomplete  or inaccurate data provided by me or distortions of diagnostic images or specimens that may result from electronic transmissions. I understand that the practice of medicine is not an exact science and that Practitioner makes no warranties or guarantees regarding treatment outcomes. I acknowledge that I will receive a copy of this consent concurrently upon execution via email to the email address I last provided but may also request a printed copy by calling the office of [facility].    I understand that my insurance will be billed for this visit.   I have read or had this consent read to me. . I understand the contents of this consent, which adequately explains the benefits and risks of the Services being provided via telemedicine.  . I have been provided ample  opportunity to ask questions regarding this consent and the Services and have had my questions answered to my satisfaction. . I give my informed consent for the services to be provided through the use of telemedicine in my medical care  By participating in this telemedicine visit I agree to the above.

## 2019-02-12 ENCOUNTER — Other Ambulatory Visit: Payer: Self-pay | Admitting: Internal Medicine

## 2019-02-17 ENCOUNTER — Telehealth: Payer: Self-pay | Admitting: Internal Medicine

## 2019-02-17 ENCOUNTER — Telehealth (INDEPENDENT_AMBULATORY_CARE_PROVIDER_SITE_OTHER): Payer: Medicare HMO | Admitting: Internal Medicine

## 2019-02-17 ENCOUNTER — Other Ambulatory Visit: Payer: Self-pay

## 2019-02-17 ENCOUNTER — Encounter: Payer: Self-pay | Admitting: Internal Medicine

## 2019-02-17 VITALS — BP 190/88 | HR 62 | Ht 71.0 in | Wt 188.0 lb

## 2019-02-17 DIAGNOSIS — I4821 Permanent atrial fibrillation: Secondary | ICD-10-CM

## 2019-02-17 DIAGNOSIS — R001 Bradycardia, unspecified: Secondary | ICD-10-CM

## 2019-02-17 NOTE — Telephone Encounter (Signed)
Patient returned my phone call, he is aware that we will email his link for the video visit at [place], the time of his appointment.

## 2019-02-17 NOTE — Telephone Encounter (Signed)
I called and left patient a message to call back about virtual visit this afternoon.

## 2019-02-17 NOTE — Telephone Encounter (Signed)
New Message   Patient is calling to obtain instructions for the virtual visit.

## 2019-02-17 NOTE — Progress Notes (Signed)
Electrophysiology TeleHealth Note   Due to national recommendations of social distancing due to COVID 19, an audio/video telehealth visit is felt to be most appropriate for this patient at this time.  See MyChart message from today for the patient's consent to telehealth for [facility].   Date:  02/17/2019   ID:  Nicholas Bird, DOB 06-10-1941, MRN [id number]  Location: patient's home  Provider location: [address] [place]  Evaluation Performed: Follow-up visit  PCP:  Vivi Barrack, MD  Cardiologist:    Electrophysiologist:  SK   Chief Complaint: afib and hyperension  History of Present Illness:    Nicholas Bird is a 78 y.o. male who presents via audio/video conferencing for a telehealth visit today. The patient did not have access to video technology/had technical difficulties with video requiring transitioning to audio format only (telephone).  All issues noted in this document were discussed and addressed.  No physical exam could be performed with this format.     Since last being seen in our clinic for Afib  the patient reportsdoing quite well  Has not been checking BP, but today and on repeat 190+  His wife uses the same BP cuff with measurements of 130+  Some edema of hands and feet; exuberant salt  Date Cr Hgb  3/18 0.93 14.5  3/19  1.01 11.0  8/19  13.2     The patient denies symptoms of fevers, chills, cough, or new SOB worrisome for COVID 19.    Past Medical History:  Diagnosis Date  . Alcohol dependence ([place])   . Alcoholic cirrhosis of liver ([place])   . Anticoagulant long-term use   . Atrial fibrillation, permanent    CARIOLOGIST-  DR Caryl Comes  . Bladder diverticulum   . BPH (benign prostatic hypertrophy)   . ED (erectile dysfunction) of organic origin   . Elevated LFTs    CHRONIC  . Gall stones   . Gout    per pt 12-09-2013  gout is stable  . Hematuria   . History of pancreatitis    10/2012--  resolved   . History of TIA  (transient ischemic attack)    04/2009--  no residual  . Hyperlipemia   . Hypertension   . Liver disease   . Personal history of colonic polyps 07/12/2008   TUBULAR ADENOMA  . Rosacea     Past Surgical History:  Procedure Laterality Date  . CARDIAC CATHETERIZATION  09-19-1998   false positive stress test---  normal cath and preserved lvf  . CATARACT EXTRACTION W/ INTRAOCULAR LENS  IMPLANT, BILATERAL Bilateral 2008  . CYSTOSCOPY WITH BIOPSY N/A 12/14/2013   Procedure: CYSTOSCOPY WITH BIOPSY;  Surgeon: Franchot Gallo, MD;  [facility];  Service: Urology;  Laterality: N/A;  . INGUINAL HERNIA REPAIR Left 01-14-2001  . LAPAROSCOPIC CHOLECYSTECTOMY  10-21-2008   AND LIVER BX'S  . LAPAROSCOPIC INGUINAL HERNIA REPAIR Bilateral 05-22-2009    Current Outpatient Medications  Medication Sig Dispense Refill  . allopurinol (ZYLOPRIM) 300 MG tablet Take 1 tablet (300 mg total) by mouth daily. 90 tablet 3  . candesartan-hydrochlorothiazide (ATACAND HCT) 16-12.5 MG tablet Take 1 tablet by mouth daily. Please keep upcoming appt in June with Dr. Caryl Comes for future refills. Thank you 90 tablet 0  . ELIQUIS 5 MG TABS tablet TAKE 1 TABLET(5 MG) BY MOUTH TWICE DAILY 60 tablet 6  . ferrous sulfate 325 (65 FE) MG tablet Take 325 mg by mouth 2 (  two) times daily with a meal.     . finasteride (PROSCAR) 5 MG tablet Take 5 mg by mouth.    . metoprolol succinate (TOPROL-XL) 50 MG 24 hr tablet TAKE 1 TABLET BY MOUTH ONCE DAILY WITH OR IMMEDIATELY FOLLOWING A MEAL. Please keep upcoming appt in June for future refills. Thank you 90 tablet 0  . Multiple Vitamin (MULTI-VITAMINS) TABS Take 1 tablet by mouth daily.      No current facility-administered medications for this visit.     Allergies:   Patient has no known allergies.   Social History:  The patient  reports that he has never smoked. He has never used smokeless tobacco. He reports current alcohol use of about 14.0 standard drinks of  alcohol per week. He reports that he does not use drugs.   Family History:  The patient's   family history includes Lung cancer in his mother; Skin cancer in his sister.   ROS:  Please see the history of present illness.   All other systems are personally reviewed and negative.    Exam:    Vital Signs:  BP (!) 190/88   Pulse 62   Ht 5\' 11"  (1.803 m)   Wt 188 lb (85.3 kg)   BMI 26.22 kg/m        Labs/Other Tests and Data Reviewed:    Recent Labs: 04/16/2018: Hemoglobin 13.2; Platelets 218.0   Wt Readings from Last 3 Encounters:  02/17/19 188 lb (85.3 kg)  05/13/18 194 lb 9.6 oz (88.3 kg)  04/16/18 194 lb 9.6 oz (88.3 kg)     Other studies personally reviewed: Additional studies/ records that were reviewed today include:     ASSESSMENT & PLAN:    Atrial fibrillation  Prior TIA  Bradycardia  Hypertension  Anemia     BP elevated--some edema in his hands  Will begin furosemide   Suspect will need more meds however and  Would be inclined towards changing metoprolol to labetolol   No clinical bleeding but is on Fe, will check CBC and BMET   COVID 19 screen The patient denies symptoms of COVID 19 at this time.  The importance of social distancing was discussed today.  Follow-up:   76m    Current medicines are reviewed at length with the patient today.   The patient does not have concerns regarding his medicines.  The following changes were made today:  BEGIN FUROSEMIDE 40mg  x 3 days then prn  Labs/ tests ordered today include: BMET, TSH and CBC No orders of the defined types were placed in this encounter.   I HAVE ASKED him to check BP over the weekend and send it to [place] by myCHart and will look determine response to the diuretic given his edema  Future tests ( post COVID )     Patient Risk:  after full review of this patients clinical status, I feel that they are at moderate  risk at this time.  Today, I have spent 17  minutes with the patient with  telehealth technology discussing the above.  Signed, Virl Axe, MD  02/17/2019 3:20 PM     [facility] [address] [place] [address] [phone] (office) [phone] (fax)

## 2019-02-18 DIAGNOSIS — H40013 Open angle with borderline findings, low risk, bilateral: Secondary | ICD-10-CM | POA: Diagnosis not present

## 2019-02-18 MED ORDER — FUROSEMIDE 40 MG PO TABS: 40.0000 mg | ORAL_TABLET | Freq: Every day | ORAL | 3 refills | Status: DC | PRN

## 2019-02-18 NOTE — Addendum Note (Signed)
Addended by: Dollene Primrose on: 02/18/2019 05:24 PM   Modules accepted: Orders

## 2019-02-18 NOTE — Progress Notes (Signed)
Spoke with pt regarding Dr Aquilla Hacker recommendations. He states he does not feel he is fluid overloaded and is apprehensive about taking his lasix. I encouraged him to take it tomorrow and review his BP measurements later in the day to see if there is a drop in his pressure. If there is not, Dr Caryl Comes is inclined to make some medication changes for BP control.  He will be by the office for lab work on 6/18 before heading to the beach.

## 2019-02-19 ENCOUNTER — Other Ambulatory Visit: Payer: Self-pay

## 2019-02-19 ENCOUNTER — Other Ambulatory Visit: Payer: Medicare HMO | Admitting: *Deleted

## 2019-02-19 DIAGNOSIS — R001 Bradycardia, unspecified: Secondary | ICD-10-CM

## 2019-02-19 DIAGNOSIS — C44321 Squamous cell carcinoma of skin of nose: Secondary | ICD-10-CM | POA: Diagnosis not present

## 2019-02-19 DIAGNOSIS — Z85820 Personal history of malignant melanoma of skin: Secondary | ICD-10-CM | POA: Diagnosis not present

## 2019-02-19 DIAGNOSIS — L700 Acne vulgaris: Secondary | ICD-10-CM | POA: Diagnosis not present

## 2019-02-19 DIAGNOSIS — I4821 Permanent atrial fibrillation: Secondary | ICD-10-CM

## 2019-02-19 DIAGNOSIS — D485 Neoplasm of uncertain behavior of skin: Secondary | ICD-10-CM | POA: Diagnosis not present

## 2019-02-19 DIAGNOSIS — Z85828 Personal history of other malignant neoplasm of skin: Secondary | ICD-10-CM | POA: Diagnosis not present

## 2019-02-19 DIAGNOSIS — L570 Actinic keratosis: Secondary | ICD-10-CM | POA: Diagnosis not present

## 2019-02-19 LAB — BASIC METABOLIC PANEL
BUN/Creatinine Ratio: 12 (ref 10–24)
BUN: 14 mg/dL (ref 8–27)
CO2: 26 mmol/L (ref 20–29)
Calcium: 9.7 mg/dL (ref 8.6–10.2)
Chloride: 91 mmol/L — ABNORMAL LOW (ref 96–106)
Creatinine, Ser: 1.14 mg/dL (ref 0.76–1.27)
GFR calc Af Amer: 71 mL/min/{1.73_m2} (ref >59)
GFR calc non Af Amer: 62 mL/min/{1.73_m2} (ref >59)
Glucose: 97 mg/dL (ref 65–99)
Potassium: 4.2 mmol/L (ref 3.5–5.2)
Sodium: 133 mmol/L — ABNORMAL LOW (ref 134–144)

## 2019-02-19 LAB — CBC
Hematocrit: 43.1 % (ref 37.5–51.0)
Hemoglobin: 15 g/dL (ref 13.0–17.7)
MCH: 36.8 pg — ABNORMAL HIGH (ref 26.6–33.0)
MCHC: 34.8 g/dL (ref 31.5–35.7)
MCV: 106 fL — ABNORMAL HIGH (ref 79–97)
Platelets: 194 10*3/uL (ref 150–450)
RBC: 4.08 x10E6/uL — ABNORMAL LOW (ref 4.14–5.80)
RDW: 12.7 % (ref 11.6–15.4)
WBC: 5.4 10*3/uL (ref 3.4–10.8)

## 2019-02-19 LAB — TSH: TSH: 2.29 u[IU]/mL (ref 0.450–4.500)

## 2019-02-24 ENCOUNTER — Telehealth: Payer: Self-pay

## 2019-02-24 MED ORDER — AMLODIPINE BESYLATE 5 MG PO TABS: 5.0000 mg | ORAL_TABLET | Freq: Every day | ORAL | 3 refills | Status: DC

## 2019-02-24 NOTE — Telephone Encounter (Signed)
Pt is aware of lab results and recommendations.  On a side note, his SBP has continued to run in the 180s despite the use of lasix. I will forward to Dr Caryl Comes for recommendations/medication adjustments.

## 2019-02-24 NOTE — Telephone Encounter (Signed)
Pt agrees to begin Amlodipine, 5mg  tablet, once daily.  Explained to pt the risk of facial edema with amlodipine. He understands if he begins to notice swelling, he should report to the emergency room.  He will monitor his bp and call me back with updates next week.

## 2019-02-24 NOTE — Telephone Encounter (Signed)
Lets begin amlodipine 5mg  daily Warn him plz about potential for edema Also check w him re his edema  Did it get better with diuretics  Lorren Thx SK

## 2019-02-24 NOTE — Telephone Encounter (Signed)
-----   Message from Deboraha Sprang, MD sent at 02/22/2019  9:48 PM EDT ----- Please Inform Patient that labs are normal   His MCV is quite high and will forward this to his PCP for further work up    Thanks

## 2019-03-02 ENCOUNTER — Telehealth: Payer: Self-pay | Admitting: Internal Medicine

## 2019-03-02 NOTE — Telephone Encounter (Signed)
  Pt c/o BP issue: STAT if pt c/o blurred vision, one-sided weakness or slurred speech  1. What are your last 5 BP readings?   143/74 145/73 148/75  Pulse running 70-72  2. Are you having any other symptoms (ex. Dizziness, headache, blurred vision, passed out)? NA  3. What is your BP issue? Patient was put on Amlodipine a week ago and was asked to call with readings

## 2019-03-05 NOTE — Telephone Encounter (Signed)
Pt is aware of recommendation. He states he does have some slight swelling in his ankles, which he took a lasix for which improved it. I advised him it was [facility] for him to take lasix as needed, but if he notices he has to take it every day, please call the office.  Pt will monitor his BP for the next couple of weeks as well and notify us if high.

## 2019-03-05 NOTE — Telephone Encounter (Signed)
Amlodipine will tke 2 weeks to have maximum effect  So lets follow BP for now   140 is much better than where we were

## 2019-03-09 NOTE — Telephone Encounter (Signed)
noted 

## 2019-03-11 ENCOUNTER — Telehealth: Payer: Self-pay | Admitting: Internal Medicine

## 2019-03-11 NOTE — Telephone Encounter (Signed)
New Message   Patient is calling because he was advised to provide some BP readings   7/6   151/71 84 Pulse 7/7   155/73 81 Pulse 7/8    148/82  Pulse

## 2019-03-12 NOTE — Telephone Encounter (Signed)
Lets increase atacand to 32/12.5

## 2019-03-16 MED ORDER — CANDESARTAN CILEXETIL-HCTZ 32-12.5 MG PO TABS: 1.0000 | ORAL_TABLET | Freq: Every day | ORAL | 3 refills | Status: DC

## 2019-03-16 NOTE — Telephone Encounter (Signed)
Called pt today. He states his BP's have been looking better, but still not at goal of SBP < 130. He has agreed to begin Atacand 23/12.5, continuing to monitor his BP.   He has no additional questions or concerns.

## 2019-03-17 DIAGNOSIS — Z85828 Personal history of other malignant neoplasm of skin: Secondary | ICD-10-CM | POA: Diagnosis not present

## 2019-03-17 DIAGNOSIS — Z85820 Personal history of malignant melanoma of skin: Secondary | ICD-10-CM | POA: Diagnosis not present

## 2019-03-17 DIAGNOSIS — C44321 Squamous cell carcinoma of skin of nose: Secondary | ICD-10-CM | POA: Diagnosis not present

## 2019-03-17 DIAGNOSIS — L988 Other specified disorders of the skin and subcutaneous tissue: Secondary | ICD-10-CM | POA: Diagnosis not present

## 2019-03-25 ENCOUNTER — Encounter: Payer: Medicare HMO | Admitting: Nutrition

## 2019-04-07 DIAGNOSIS — C434 Malignant melanoma of scalp and neck: Secondary | ICD-10-CM | POA: Diagnosis not present

## 2019-04-08 DIAGNOSIS — C434 Malignant melanoma of scalp and neck: Secondary | ICD-10-CM | POA: Diagnosis not present

## 2019-04-14 ENCOUNTER — Ambulatory Visit: Payer: Medicare HMO | Admitting: Family Medicine

## 2019-04-20 ENCOUNTER — Ambulatory Visit (INDEPENDENT_AMBULATORY_CARE_PROVIDER_SITE_OTHER): Payer: Medicare HMO | Admitting: Family Medicine

## 2019-04-20 ENCOUNTER — Other Ambulatory Visit: Payer: Self-pay

## 2019-04-20 ENCOUNTER — Encounter: Payer: Self-pay | Admitting: Family Medicine

## 2019-04-20 VITALS — BP 142/62 | HR 49 | Temp 97.8°F | Ht 71.0 in | Wt 193.0 lb

## 2019-04-20 DIAGNOSIS — Z6826 Body mass index (BMI) 26.0-26.9, adult: Secondary | ICD-10-CM | POA: Diagnosis not present

## 2019-04-20 DIAGNOSIS — F1020 Alcohol dependence, uncomplicated: Secondary | ICD-10-CM

## 2019-04-20 DIAGNOSIS — I10 Essential (primary) hypertension: Secondary | ICD-10-CM | POA: Diagnosis not present

## 2019-04-20 DIAGNOSIS — C439 Malignant melanoma of skin, unspecified: Secondary | ICD-10-CM

## 2019-04-20 DIAGNOSIS — M1A9XX0 Chronic gout, unspecified, without tophus (tophi): Secondary | ICD-10-CM | POA: Diagnosis not present

## 2019-04-20 DIAGNOSIS — I4891 Unspecified atrial fibrillation: Secondary | ICD-10-CM | POA: Diagnosis not present

## 2019-04-20 DIAGNOSIS — E785 Hyperlipidemia, unspecified: Secondary | ICD-10-CM

## 2019-04-20 DIAGNOSIS — E663 Overweight: Secondary | ICD-10-CM | POA: Diagnosis not present

## 2019-04-20 DIAGNOSIS — Z0000 Encounter for general adult medical examination without abnormal findings: Secondary | ICD-10-CM | POA: Diagnosis not present

## 2019-04-20 DIAGNOSIS — R69 Illness, unspecified: Secondary | ICD-10-CM | POA: Diagnosis not present

## 2019-04-20 MED ORDER — ALLOPURINOL 300 MG PO TABS: 300.0000 mg | ORAL_TABLET | Freq: Every day | ORAL | 3 refills | Status: DC

## 2019-04-20 NOTE — Assessment & Plan Note (Signed)
Continue allopurinol 300 mg daily 

## 2019-04-20 NOTE — Assessment & Plan Note (Addendum)
LDL 96 off statins.

## 2019-04-20 NOTE — Assessment & Plan Note (Signed)
Stable.  Rate controlled with metoprolol 50 mg daily.  Anticoagulated on Eliquis 5 mg twice daily.

## 2019-04-20 NOTE — Progress Notes (Signed)
Chief Complaint:  Nicholas Bird is a 78 y.o. male who presents today for a subsequent Medicare Annual Wellness Visit and to discuss management of his chronic medical problems.  Assessment/Plan:  Atrial fibrillation (HCC) Stable.  Rate controlled with metoprolol 50 mg daily.  Anticoagulated on Eliquis 5 mg twice daily.  HTN (hypertension) At goal.  Continue candesartan- HCTZ 32-12.5mg  daily and metoprolol succinate 50 mg daily.  Alcoholism ([place]) Encourage cessation  Gout Continue allopurinol 300 mg daily.  Dyslipidemia LDL 96 off statins.   Malignant melanoma Continue management per dermatology and plastic surgery.  Body mass index is 26.92 kg/m. / Overweight BMI Metric Follow Up - 04/20/19 1000      BMI Metric Follow Up-Please document annually   BMI Metric Follow Up  Education provided       Preventative Healthcare Due for flu vaccine later this year.  Colonoscopy due next year.  During the course of the visit the patient was educated and counseled about appropriate screening and preventive services including:        Fall prevention   Nutrition Physical Activity Weight Management Cognition    Subjective:  HPI:  Health Risk Assessment: Patient considers his overall health to be good. He has no difficulty performing the following: . Preparing food and eating . Bathing  . Getting dressed . Using the toilet . Shopping . Managing Finances . Moving around from place to place  He has not had any falls within the past year.   Depression screen PHQ 2/9 04/20/2019  Decreased Interest 0  Down, Depressed, Hopeless 0  PHQ - 2 Score 0   Lifestyle Factors: Diet: No specific diets or eating plans  Exercise: Walks every day. Golf group twice per week.   Patient Care Team: Vivi Barrack, MD as PCP - General (Family Medicine) Deboraha Sprang, MD (Cardiology) Franchot Gallo, MD as Attending Physician (Urology) Jarome Matin, MD as Consulting Physician  (Dermatology) Katy Apo, MD as Consulting Physician (Ophthalmology) Irene Shipper, MD as Consulting Physician (Gastroenterology)   He has no acute complaints today.   His chronic medical conditions are outlined below:  # Gout - On allopurinol 300mg  daily and tolerating well - No recent flares  # BPH / Hematuria - Follows with urology - On finasteride 5mg  daily  # Essential hypertension / Afib - Follows with cardiology - Anticoagulated on eliquis 5mg  twice daily - On metoprolol 50mg  daily and candesartan-HCTZ 32-12.5mg  daily. Tolerating well without side effects  # Iron deficiency anemia - On ferrous sulfate 325mg  daily   ROS: Per HPI  PMH:  The following were reviewed and entered/updated in epic: Past Medical History:  Diagnosis Date  . Alcohol dependence ([place])   . Alcoholic cirrhosis of liver ([place])   . Anticoagulant long-term use   . Atrial fibrillation, permanent    CARIOLOGIST-  DR Caryl Comes  . Bladder diverticulum   . BPH (benign prostatic hypertrophy)   . ED (erectile dysfunction) of organic origin   . Elevated LFTs    CHRONIC  . Gall stones   . Gout    per pt 12-09-2013  gout is stable  . Hematuria   . History of pancreatitis    10/2012--  resolved   . History of TIA (transient ischemic attack)    04/2009--  no residual  . Hyperlipemia   . Hypertension   . Liver disease   . Personal history of colonic polyps 07/12/2008   TUBULAR ADENOMA  . Rosacea  Past Surgical History:  Procedure Laterality Date  . CARDIAC CATHETERIZATION  09-19-1998   false positive stress test---  normal cath and preserved lvf  . CATARACT EXTRACTION W/ INTRAOCULAR LENS  IMPLANT, BILATERAL Bilateral 2008  . CYSTOSCOPY WITH BIOPSY N/A 12/14/2013   Procedure: CYSTOSCOPY WITH BIOPSY;  Surgeon: Franchot Gallo, MD;  [facility];  Service: Urology;  Laterality: N/A;  . INGUINAL HERNIA REPAIR Left 01-14-2001  . LAPAROSCOPIC CHOLECYSTECTOMY  10-21-2008    AND LIVER BX'S  . LAPAROSCOPIC INGUINAL HERNIA REPAIR Bilateral 05-22-2009   Family History  Problem Relation Age of Onset  . Lung cancer Mother   . Skin cancer Sister   . Colon cancer Neg Hx     Medications- reviewed and updated Current Outpatient Medications  Medication Sig Dispense Refill  . allopurinol (ZYLOPRIM) 300 MG tablet Take 1 tablet (300 mg total) by mouth daily. 90 tablet 3  . amLODipine (NORVASC) 5 MG tablet Take 1 tablet (5 mg total) by mouth daily. 90 tablet 3  . candesartan-hydrochlorothiazide (ATACAND HCT) 32-12.5 MG tablet Take 1 tablet by mouth daily. 90 tablet 3  . ELIQUIS 5 MG TABS tablet TAKE 1 TABLET(5 MG) BY MOUTH TWICE DAILY 60 tablet 6  . ferrous sulfate 325 (65 FE) MG tablet Take 325 mg by mouth 2 (two) times daily with a meal.     . finasteride (PROSCAR) 5 MG tablet Take 5 mg by mouth.    . furosemide (LASIX) 40 MG tablet Take 1 tablet (40 mg total) by mouth daily as needed for fluid or edema. 90 tablet 3  . metoprolol succinate (TOPROL-XL) 50 MG 24 hr tablet TAKE 1 TABLET BY MOUTH ONCE DAILY WITH OR IMMEDIATELY FOLLOWING A MEAL. Please keep upcoming appt in June for future refills. Thank you 90 tablet 0  . Multiple Vitamin (MULTI-VITAMINS) TABS Take 1 tablet by mouth daily.      No current facility-administered medications for this visit.    Allergies-reviewed and updated No Known Allergies  Social History   Socioeconomic History  . Marital status: Married    Spouse name: Not on file  . Number of children: 2  . Years of education: Not on file  . Highest education level: Not on file  Occupational History  . Occupation: retired    Fish farm manager: RETIRED  Social Needs  . Financial resource strain: Not on file  . Food insecurity    Worry: Not on file    Inability: Not on file  . Transportation needs    Medical: Not on file    Non-medical: Not on file  Tobacco Use  . Smoking status: Never Smoker  . Smokeless tobacco: Never Used  Substance and  Sexual Activity  . Alcohol use: Yes    Alcohol/week: 14.0 standard drinks    Types: 14 Shots of liquor per week    Comment: 3 oz. per day  . Drug use: No  . Sexual activity: Yes    Partners: Female  Lifestyle  . Physical activity    Days per week: Not on file    Minutes per session: Not on file  . Stress: Not on file  Relationships  . Social Herbalist on phone: Not on file    Gets together: Not on file    Attends religious service: Not on file    Active member of club or organization: Not on file    Attends meetings of clubs or organizations: Not on file  Relationship status: Not on file  Other Topics Concern  . Not on file  Social History Narrative  . Not on file         Objective/Observations  Physical Exam: BP (!) 142/62 (BP Location: Left Arm, Patient Position: Sitting, Cuff Size: Normal)   Pulse (!) 49   Temp 97.8 F (36.6 C) (Oral)   Ht 5\' 11"  (1.803 m)   Wt 193 lb (87.5 kg)   SpO2 97%   BMI 26.92 kg/m  Wt Readings from Last 3 Encounters:  04/20/19 193 lb (87.5 kg)  02/17/19 188 lb (85.3 kg)  05/13/18 194 lb 9.6 oz (88.3 kg)  Gen: NAD, resting comfortably HEENT: TMs normal bilaterally. OP clear. No thyromegaly noted.  CV: RRR with no murmurs appreciated Pulm: NWOB, CTAB with no crackles, wheezes, or rhonchi GI: Normal bowel sounds present. Soft, Nontender, Nondistended. MSK: no edema, cyanosis, or clubbing noted Skin: warm, dry Neuro: CN2-12 grossly intact. Strength 5/5 in upper and lower extremities. Reflexes symmetric and intact bilaterally. No apparent cognitive deficits.  Psych: Normal affect and thought content  No results found for this or any previous visit (from the past 24 hour(s)).      Algis Greenhouse. [name], MD 04/20/2019 10:01 AM

## 2019-04-20 NOTE — Patient Instructions (Signed)
It was very nice to see you today!  Keep up the good work!  No changes today.  Come back in 1 year for your next physical, or sooner if needed.   Take care, Dr [name]  Please try these tips to maintain a healthy lifestyle:   Eat at least 3 REAL meals and 1-2 snacks per day.  Aim for no more than 5 hours between eating.  If you eat breakfast, please do so within one hour of getting up.    Obtain twice as many fruits/vegetables as protein or carbohydrate foods for both lunch and dinner. (Half of each meal should be fruits/vegetables, one quarter protein, and one quarter starchy carbs)   Cut down on sweet beverages. This includes juice, soda, and sweet tea.    Exercise at least 150 minutes every week.    Preventive Care 19 Years and Older, Male Preventive care refers to lifestyle choices and visits with your health care provider that can promote health and wellness. This includes:  A yearly physical exam. This is also called an annual well check.  Regular dental and eye exams.  Immunizations.  Screening for certain conditions.  Healthy lifestyle choices, such as diet and exercise. What can I expect for my preventive care visit? Physical exam Your health care provider will check:  Height and weight. These may be used to calculate body mass index (BMI), which is a measurement that tells if you are at a healthy weight.  Heart rate and blood pressure.  Your skin for abnormal spots. Counseling Your health care provider may ask you questions about:  Alcohol, tobacco, and drug use.  Emotional well-being.  Home and relationship well-being.  Sexual activity.  Eating habits.  History of falls.  Memory and ability to understand (cognition).  Work and work Statistician. What immunizations do I need?  Influenza (flu) vaccine  This is recommended every year. Tetanus, diphtheria, and pertussis (Tdap) vaccine  You may need a Td booster every 10 years. Varicella  (chickenpox) vaccine  You may need this vaccine if you have not already been vaccinated. Zoster (shingles) vaccine  You may need this after age 27. Pneumococcal conjugate (PCV13) vaccine  One dose is recommended after age 64. Pneumococcal polysaccharide (PPSV23) vaccine  One dose is recommended after age 73. Measles, mumps, and rubella (MMR) vaccine  You may need at least one dose of MMR if you were born in 1957 or later. You may also need a second dose. Meningococcal conjugate (MenACWY) vaccine  You may need this if you have certain conditions. Hepatitis A vaccine  You may need this if you have certain conditions or if you travel or work in places where you may be exposed to hepatitis A. Hepatitis B vaccine  You may need this if you have certain conditions or if you travel or work in places where you may be exposed to hepatitis B. Haemophilus influenzae type b (Hib) vaccine  You may need this if you have certain conditions. You may receive vaccines as individual doses or as more than one vaccine together in one shot (combination vaccines). Talk with your health care provider about the risks and benefits of combination vaccines. What tests do I need? Blood tests  Lipid and cholesterol levels. These may be checked every 5 years, or more frequently depending on your overall health.  Hepatitis C test.  Hepatitis B test. Screening  Lung cancer screening. You may have this screening every year starting at age 12 if you have a  30-pack-year history of smoking and currently smoke or have quit within the past 15 years.  Colorectal cancer screening. All adults should have this screening starting at age 83 and continuing until age 42. Your health care provider may recommend screening at age 29 if you are at increased risk. You will have tests every 1-10 years, depending on your results and the type of screening test.  Prostate cancer screening. Recommendations will vary depending on  your family history and other risks.  Diabetes screening. This is done by checking your blood sugar (glucose) after you have not eaten for a while (fasting). You may have this done every 1-3 years.  Abdominal aortic aneurysm (AAA) screening. You may need this if you are a current or former smoker.  Sexually transmitted disease (STD) testing. Follow these instructions at home: Eating and drinking  Eat a diet that includes fresh fruits and vegetables, whole grains, lean protein, and low-fat dairy products. Limit your intake of foods with high amounts of sugar, saturated fats, and salt.  Take vitamin and mineral supplements as recommended by your health care provider.  Do not drink alcohol if your health care provider tells you not to drink.  If you drink alcohol: ? Limit how much you have to 0-2 drinks a day. ? Be aware of how much alcohol is in your drink. In the [place] one drink equals one 12 oz bottle of beer (355 mL), one 5 oz glass of wine (148 mL), or one 1 oz glass of hard liquor (44 mL). Lifestyle  Take daily care of your teeth and gums.  Stay active. Exercise for at least 30 minutes on 5 or more days each week.  Do not use any products that contain nicotine or tobacco, such as cigarettes, e-cigarettes, and chewing tobacco. If you need help quitting, ask your health care provider.  If you are sexually active, practice safe sex. Use a condom or other form of protection to prevent STIs (sexually transmitted infections).  Talk with your health care provider about taking a low-dose aspirin or statin. What's next?  Visit your health care provider once a year for a well check visit.  Ask your health care provider how often you should have your eyes and teeth checked.  Stay up to date on all vaccines. This information is not intended to replace advice given to you by your health care provider. Make sure you discuss any questions you have with your health care provider. Document  Released: 09/16/2015 Document Revised: 08/14/2018 Document Reviewed: 08/14/2018 Elsevier Patient Education  2020 [employer].

## 2019-04-20 NOTE — Assessment & Plan Note (Signed)
Continue management per dermatology and plastic surgery.

## 2019-04-20 NOTE — Assessment & Plan Note (Signed)
Encourage cessation. °

## 2019-04-20 NOTE — Assessment & Plan Note (Signed)
At goal.  Continue candesartan- HCTZ 32-12.5mg  daily and metoprolol succinate 50 mg daily.

## 2019-05-13 ENCOUNTER — Other Ambulatory Visit: Payer: Self-pay | Admitting: Internal Medicine

## 2019-05-14 MED ORDER — CANDESARTAN CILEXETIL-HCTZ 32-12.5 MG PO TABS: 1.0000 | ORAL_TABLET | Freq: Every day | ORAL | 2 refills | Status: DC

## 2019-05-15 ENCOUNTER — Telehealth: Payer: Self-pay | Admitting: Internal Medicine

## 2019-05-15 NOTE — Telephone Encounter (Signed)
LVM for return call. 

## 2019-05-15 NOTE — Telephone Encounter (Signed)
Follow up ° ° ° ° ° °Returning a call to the nurse °

## 2019-05-15 NOTE — Telephone Encounter (Signed)
Patient called back to give the correct name of the medication it is amLODipine (NORVASC) 5 MG tablet not allopurinol.

## 2019-05-15 NOTE — Telephone Encounter (Signed)
Spoke with Nicholas Bird who has c/o bilateral ankle swelling. He stopped his amlodipine for the last 5 days and this relieved his swelling but his BP has since increased. Nicholas Bird states he takes his lasix for swelling occasionally, but does not seem to help.  I advised Nicholas Bird to begin back on his Amlodipine. If he notices his swelling starting to worsen, he should go ahead and take his lasix. He understands Dr. Caryl Comes is out of the office for the next week. I will forward message to him for recommendation, but he understands we may not be able to get back with an answer for the next week.   He has verbalized understanding and had no additional questions.

## 2019-05-15 NOTE — Telephone Encounter (Signed)
Pt c/o medication issue:  1. Name of Nicholas Bird  How are you currently taking this medication (dosage and times per day)?  1 a day  3. Are you having a reaction (difficulty breathing--STAT)? no  4. What is your medication issue? His ankles are swelling- pt stopped taking it 5 days ago. It made his ankles go down, but his blood pressure went back up

## 2019-05-18 NOTE — Telephone Encounter (Signed)
Nicholas Bird He can also followup with his PCP regarding his BP if he prefers  Thx SK

## 2019-05-19 DIAGNOSIS — R69 Illness, unspecified: Secondary | ICD-10-CM | POA: Diagnosis not present

## 2019-06-12 ENCOUNTER — Other Ambulatory Visit: Payer: Self-pay | Admitting: Internal Medicine

## 2019-06-12 NOTE — Telephone Encounter (Signed)
LMTCB regarding his swelling/ Kleins suggestion to f/u with PCP

## 2019-06-12 NOTE — Telephone Encounter (Signed)
Pt last saw Dr Caryl Comes 02/17/19 telemedicine visit Covid-19, last labs 02/19/19 Creat 1.14, age 78, weight 87.5kg, based on specified criteria pt is on appropriate dosage of Eliquis 5mg  BID.  Will refill rx.

## 2019-06-15 NOTE — Telephone Encounter (Signed)
lmtcb ./cy 

## 2019-06-16 NOTE — Telephone Encounter (Signed)
I spoke to the patient who said that his swelling has reduced greatly.  I gave him Dr Olin Pia recommendation to f/u with PCP.  He verbalized understanding.

## 2019-06-30 ENCOUNTER — Other Ambulatory Visit: Payer: Self-pay | Admitting: Family Medicine

## 2019-07-22 DIAGNOSIS — L821 Other seborrheic keratosis: Secondary | ICD-10-CM | POA: Diagnosis not present

## 2019-07-22 DIAGNOSIS — Z85828 Personal history of other malignant neoplasm of skin: Secondary | ICD-10-CM | POA: Diagnosis not present

## 2019-07-22 DIAGNOSIS — L570 Actinic keratosis: Secondary | ICD-10-CM | POA: Diagnosis not present

## 2019-07-22 DIAGNOSIS — L853 Xerosis cutis: Secondary | ICD-10-CM | POA: Diagnosis not present

## 2019-07-22 DIAGNOSIS — Z85820 Personal history of malignant melanoma of skin: Secondary | ICD-10-CM | POA: Diagnosis not present

## 2019-09-23 ENCOUNTER — Ambulatory Visit: Payer: Medicare Other | Attending: Family Medicine

## 2019-09-23 DIAGNOSIS — Z23 Encounter for immunization: Secondary | ICD-10-CM

## 2019-09-23 NOTE — Progress Notes (Signed)
   Covid-19 Vaccination Clinic  Name:  Nicholas Bird    MRN: [id number] DOB: 10-07-1940  09/23/2019  Nicholas Bird was observed post Covid-19 immunization for 15 minutes without incidence. He was provided with Vaccine Information Sheet and instruction to access the V-Safe system.   Nicholas Bird was instructed to call 911 with any severe reactions post vaccine: Marland Kitchen Difficulty breathing  . Swelling of your face and throat  . A fast heartbeat  . A bad rash all over your body  . Dizziness and weakness    Immunizations Administered    Name Date Dose VIS Date Route   Pfizer COVID-19 Vaccine 09/23/2019 11:43 AM 0.3 mL 08/14/2019 Intramuscular   Manufacturer: [place]   Lot: [id number]   [place]: [id number]

## 2019-10-13 DIAGNOSIS — C439 Malignant melanoma of skin, unspecified: Secondary | ICD-10-CM | POA: Diagnosis not present

## 2019-10-14 ENCOUNTER — Ambulatory Visit: Payer: Medicare Other | Attending: Internal Medicine

## 2019-10-14 DIAGNOSIS — Z23 Encounter for immunization: Secondary | ICD-10-CM | POA: Insufficient documentation

## 2019-10-14 NOTE — Progress Notes (Signed)
   Covid-19 Vaccination Clinic  Name:  LANKFORD PARI    MRN: [id number] DOB: [birth year]  10/14/2019  Mr. Lightle was observed post Covid-19 immunization for 15 minutes without incidence. He was provided with Vaccine Information Sheet and instruction to access the V-Safe system.   Mr. Risko was instructed to call 911 with any severe reactions post vaccine: Marland Kitchen Difficulty breathing  . Swelling of your face and throat  . A fast heartbeat  . A bad rash all over your body  . Dizziness and weakness    Immunizations Administered    Name Date Dose VIS Date Route   Pfizer COVID-19 Vaccine 10/14/2019 11:25 AM 0.3 mL 08/14/2019 Intramuscular   Manufacturer: [place]   Lot: [id number]   [place]: [id number]

## 2019-11-18 DIAGNOSIS — H5201 Hypermetropia, right eye: Secondary | ICD-10-CM | POA: Diagnosis not present

## 2019-11-18 DIAGNOSIS — Z961 Presence of intraocular lens: Secondary | ICD-10-CM | POA: Diagnosis not present

## 2019-11-18 DIAGNOSIS — H40013 Open angle with borderline findings, low risk, bilateral: Secondary | ICD-10-CM | POA: Diagnosis not present

## 2019-12-30 ENCOUNTER — Other Ambulatory Visit: Payer: Self-pay | Admitting: Internal Medicine

## 2019-12-30 NOTE — Telephone Encounter (Signed)
Eliquis 5mg  refill request received. Patient is 79 years old, weight-87.5kg, Crea-1.14 on 02/19/2019, Diagnosis-Afib, and last seen by Dr. Caryl Comes on 02/17/2019. Dose is appropriate based on dosing criteria. Will send in refill to requested pharmacy.

## 2020-01-12 ENCOUNTER — Other Ambulatory Visit: Payer: Self-pay | Admitting: Internal Medicine

## 2020-01-12 ENCOUNTER — Telehealth: Payer: Self-pay | Admitting: Internal Medicine

## 2020-01-12 MED ORDER — AMLODIPINE BESYLATE 5 MG PO TABS: 5.0000 mg | ORAL_TABLET | Freq: Every day | ORAL | 0 refills | Status: DC

## 2020-01-12 MED ORDER — METOPROLOL SUCCINATE ER 50 MG PO TB24: ORAL_TABLET | ORAL | 0 refills | Status: DC

## 2020-01-12 NOTE — Telephone Encounter (Signed)
Pt's medications were sent to pt's pharmacy as requested, a 10 day supply until mail order arrives. Confirmation received.

## 2020-01-12 NOTE — Telephone Encounter (Signed)
New Message   *STAT* If patient is at the pharmacy, call can be transferred to refill team.   1. Which medications need to be refilled? (please list name of each medication and dose if known) amLODipine (NORVASC) 5 MG tablet metoprolol succinate (TOPROL-XL) 50 MG 24 hr tablet  2. Which pharmacy/location (including street and city if local pharmacy) is medication to be sent to? [place].  3. Do they need a 30 day or 90 day supply? Short Supply (7 days)

## 2020-01-27 DIAGNOSIS — L570 Actinic keratosis: Secondary | ICD-10-CM | POA: Diagnosis not present

## 2020-01-27 DIAGNOSIS — D2271 Melanocytic nevi of right lower limb, including hip: Secondary | ICD-10-CM | POA: Diagnosis not present

## 2020-01-27 DIAGNOSIS — L821 Other seborrheic keratosis: Secondary | ICD-10-CM | POA: Diagnosis not present

## 2020-01-27 DIAGNOSIS — D2272 Melanocytic nevi of left lower limb, including hip: Secondary | ICD-10-CM | POA: Diagnosis not present

## 2020-01-27 DIAGNOSIS — Z85820 Personal history of malignant melanoma of skin: Secondary | ICD-10-CM | POA: Diagnosis not present

## 2020-01-27 DIAGNOSIS — D225 Melanocytic nevi of trunk: Secondary | ICD-10-CM | POA: Diagnosis not present

## 2020-01-27 DIAGNOSIS — Z85828 Personal history of other malignant neoplasm of skin: Secondary | ICD-10-CM | POA: Diagnosis not present

## 2020-01-29 ENCOUNTER — Other Ambulatory Visit: Payer: Self-pay | Admitting: Internal Medicine

## 2020-02-16 ENCOUNTER — Other Ambulatory Visit: Payer: Self-pay

## 2020-02-16 MED ORDER — CANDESARTAN CILEXETIL-HCTZ 32-12.5 MG PO TABS: 1.0000 | ORAL_TABLET | Freq: Every day | ORAL | 0 refills | Status: DC

## 2020-03-08 DIAGNOSIS — C44719 Basal cell carcinoma of skin of left lower limb, including hip: Secondary | ICD-10-CM | POA: Diagnosis not present

## 2020-03-08 DIAGNOSIS — Z85820 Personal history of malignant melanoma of skin: Secondary | ICD-10-CM | POA: Diagnosis not present

## 2020-03-08 DIAGNOSIS — D485 Neoplasm of uncertain behavior of skin: Secondary | ICD-10-CM | POA: Diagnosis not present

## 2020-03-08 DIAGNOSIS — Z85828 Personal history of other malignant neoplasm of skin: Secondary | ICD-10-CM | POA: Diagnosis not present

## 2020-03-25 ENCOUNTER — Other Ambulatory Visit: Payer: Self-pay | Admitting: Internal Medicine

## 2020-03-29 ENCOUNTER — Other Ambulatory Visit: Payer: Self-pay | Admitting: Internal Medicine

## 2020-03-29 NOTE — Telephone Encounter (Signed)
Pt last saw Dr Caryl Comes 02/17/19 telemedicine Covid-19, pt is overdue for follow-up. Per OV pt was due for follow-up with Dr Caryl Comes in 6 months. Sent message to scheduler to call pt for appt.  Last labs 8/4//20 Creat 1.1 in care everywhere at [facility], age 79, weight 87.5, based on specified criteria pt is on appropriate dosage of Eliquis 5mg  BID.  Will need repeat labwork at [place] as well given labs almost 79 year old.

## 2020-03-30 ENCOUNTER — Telehealth: Payer: Self-pay | Admitting: Family Medicine

## 2020-03-30 NOTE — Telephone Encounter (Signed)
Schedulers were able to make the pt an appt for Eliquis refill  79 yrs old, wt-87.5kg, Crea-1.1 on 04/07/2019 via [place] from [facility] and pending an appt with Tommye Standard on 04/18/2020; previous appt 02/17/2019 with Dr. Caryl Comes via Telemedicine and was due to follow up in 6 months after that visit. Also, will place a note to obtain updated since they will be more than a year old when he has his visit with Renee.

## 2020-03-30 NOTE — Progress Notes (Signed)
  Chronic Care Management   Note  03/30/2020 Name: Nicholas Bird MRN: [id number] DOB: 1941-04-04  Nicholas Bird is a 79 y.o. year old male who is a primary care patient of Vivi Barrack, MD. I reached out to [name] by phone today in response to a referral sent by Nicholas Bird PCP, Vivi Barrack, MD.   Nicholas Bird was given information about Chronic Care Management services today including:  1. CCM service includes personalized support from designated clinical staff supervised by his physician, including individualized plan of care and coordination with other care providers 2. 24/7 contact phone numbers for assistance for urgent and routine care needs. 3. Service will only be billed when office clinical staff spend 20 minutes or more in a month to coordinate care. 4. Only one practitioner may furnish and bill the service in a calendar month. 5. The patient may stop CCM services at any time (effective at the end of the month) by phone call to the office staff.   Patient agreed to services and verbal consent obtained.   Follow up plan:   Nicholas Bird [employer] Scheduler

## 2020-04-02 ENCOUNTER — Other Ambulatory Visit: Payer: Self-pay | Admitting: Internal Medicine

## 2020-04-05 ENCOUNTER — Other Ambulatory Visit: Payer: Self-pay | Admitting: Internal Medicine

## 2020-04-12 DIAGNOSIS — Q453 Other congenital malformations of pancreas and pancreatic duct: Secondary | ICD-10-CM | POA: Diagnosis not present

## 2020-04-12 DIAGNOSIS — K8689 Other specified diseases of pancreas: Secondary | ICD-10-CM | POA: Diagnosis not present

## 2020-04-12 DIAGNOSIS — C439 Malignant melanoma of skin, unspecified: Secondary | ICD-10-CM | POA: Diagnosis not present

## 2020-04-13 ENCOUNTER — Other Ambulatory Visit: Payer: Self-pay | Admitting: Internal Medicine

## 2020-04-13 DIAGNOSIS — Z9049 Acquired absence of other specified parts of digestive tract: Secondary | ICD-10-CM | POA: Diagnosis not present

## 2020-04-13 DIAGNOSIS — C434 Malignant melanoma of scalp and neck: Secondary | ICD-10-CM | POA: Diagnosis not present

## 2020-04-13 DIAGNOSIS — Z9889 Other specified postprocedural states: Secondary | ICD-10-CM | POA: Diagnosis not present

## 2020-04-13 DIAGNOSIS — R001 Bradycardia, unspecified: Secondary | ICD-10-CM

## 2020-04-13 DIAGNOSIS — I4891 Unspecified atrial fibrillation: Secondary | ICD-10-CM | POA: Diagnosis not present

## 2020-04-13 DIAGNOSIS — Z7901 Long term (current) use of anticoagulants: Secondary | ICD-10-CM | POA: Diagnosis not present

## 2020-04-13 DIAGNOSIS — Z8673 Personal history of transient ischemic attack (TIA), and cerebral infarction without residual deficits: Secondary | ICD-10-CM | POA: Diagnosis not present

## 2020-04-13 DIAGNOSIS — I4821 Permanent atrial fibrillation: Secondary | ICD-10-CM

## 2020-04-13 DIAGNOSIS — Z79899 Other long term (current) drug therapy: Secondary | ICD-10-CM | POA: Diagnosis not present

## 2020-04-14 DIAGNOSIS — C44319 Basal cell carcinoma of skin of other parts of face: Secondary | ICD-10-CM | POA: Diagnosis not present

## 2020-04-14 DIAGNOSIS — D485 Neoplasm of uncertain behavior of skin: Secondary | ICD-10-CM | POA: Diagnosis not present

## 2020-04-14 DIAGNOSIS — Z85828 Personal history of other malignant neoplasm of skin: Secondary | ICD-10-CM | POA: Diagnosis not present

## 2020-04-14 DIAGNOSIS — Z85820 Personal history of malignant melanoma of skin: Secondary | ICD-10-CM | POA: Diagnosis not present

## 2020-04-14 DIAGNOSIS — C434 Malignant melanoma of scalp and neck: Secondary | ICD-10-CM | POA: Diagnosis not present

## 2020-04-17 NOTE — Progress Notes (Signed)
Cardiology Office Note Date:  04/18/2020  Patient ID:  Nicholas Bird, Nicholas Bird 18-Jan-1941, MRN [id number] PCP:  Vivi Barrack, MD  Cardiologist:  Dr. Caryl Comes   Chief Complaint:  overdue annual visit  History of Present Illness: PASTOR SGRO is a 79 y.o. male with history of ETOH abuse, cirrhosis with h/o pancreatitis,  BPH, HTN, HLD, TIA, permanent AFib  He comes in today to be seen for Dr. Caryl Comes, last seen by him in June 2020 via tele health.  His BP was elevated, some edema and mention of increased salt intake. Started on furosemide.  Mentioned continued metoprolol, though would be inclined to change to labetalol.  Seems via telephone discussion, amlodipine started and his atacand up titrated.  He is doing quite well No CP, palpitations or cardiac awareness No dizzy spells, near syncope or syncope He walks and does light weight exercising daily, golfs as well. No exertional intolerances  He has chronic intermittent hematuria with bladder diverticula, follows with urology.  2 glasses of wine a night  He sees his PMD in a couple weeks for annual visit and labs    Past Medical History:  Diagnosis Date  . Alcohol dependence ([place])   . Alcoholic cirrhosis of liver ([place])   . Anticoagulant long-term use   . Atrial fibrillation, permanent    CARIOLOGIST-  DR Caryl Comes  . Bladder diverticulum   . BPH (benign prostatic hypertrophy)   . ED (erectile dysfunction) of organic origin   . Elevated LFTs    CHRONIC  . Gall stones   . Gout    per pt 12-09-2013  gout is stable  . Hematuria   . History of pancreatitis    10/2012--  resolved   . History of TIA (transient ischemic attack)    04/2009--  no residual  . Hyperlipemia   . Hypertension   . Liver disease   . Personal history of colonic polyps 07/12/2008   TUBULAR ADENOMA  . Rosacea     Past Surgical History:  Procedure Laterality Date  . CARDIAC CATHETERIZATION  09-19-1998   false positive stress test---  normal cath and  preserved lvf  . CATARACT EXTRACTION W/ INTRAOCULAR LENS  IMPLANT, BILATERAL Bilateral 2008  . CYSTOSCOPY WITH BIOPSY N/A 12/14/2013   Procedure: CYSTOSCOPY WITH BIOPSY;  Surgeon: Franchot Gallo, MD;  [facility];  Service: Urology;  Laterality: N/A;  . INGUINAL HERNIA REPAIR Left 01-14-2001  . LAPAROSCOPIC CHOLECYSTECTOMY  10-21-2008   AND LIVER BX'S  . LAPAROSCOPIC INGUINAL HERNIA REPAIR Bilateral 05-22-2009    Current Outpatient Medications  Medication Sig Dispense Refill  . allopurinol (ZYLOPRIM) 300 MG tablet TAKE 1 TABLET(300 MG) BY MOUTH DAILY 90 tablet 3  . amLODipine (NORVASC) 5 MG tablet Take 1 tablet (5 mg total) by mouth daily. 90 tablet 0  . ampicillin (PRINCIPEN) 500 MG capsule Take 500 mg by mouth as needed.    . candesartan-hydrochlorothiazide (ATACAND HCT) 32-12.5 MG tablet TAKE 1 TABLET BY MOUTH DAILY 90 tablet 0  . ELIQUIS 5 MG TABS tablet TAKE 1 TABLET(5 MG) BY MOUTH TWICE DAILY 60 tablet 1  . ferrous sulfate 325 (65 FE) MG tablet Take 325 mg by mouth 2 (two) times daily with a meal.     . finasteride (PROSCAR) 5 MG tablet Take 5 mg by mouth.    . furosemide (LASIX) 40 MG tablet Take 40 mg by mouth daily.    . metoprolol succinate (TOPROL-XL) 50 MG 24 hr tablet TAKE  1 TABLET BY MOUTH EVERY DAY WITH OR IMMEDIATELY FOLLOWING A MEAL 90 tablet 0  . Multiple Vitamin (MULTI-VITAMINS) TABS Take 1 tablet by mouth daily.      No current facility-administered medications for this visit.    Allergies:   Patient has no known allergies.   Social History:  The patient  reports that he has never smoked. He has never used smokeless tobacco. He reports current alcohol use of about 14.0 standard drinks of alcohol per week. He reports that he does not use drugs.   Family History:  The patient's family history includes Lung cancer in his mother; Skin cancer in his sister.  ROS:  Please see the history of present illness. All other systems are reviewed and  otherwise negative.   PHYSICAL EXAM:  VS:  BP (!) 112/56   Pulse 60   Ht 5\' 11"  (1.803 m)   Wt 187 lb (84.8 kg)   BMI 26.08 kg/m  BMI: Body mass index is 26.08 kg/m. Well nourished, well developed, in no acute distress  HEENT: normocephalic, atraumatic  Neck: no JVD, carotid bruits or masses Cardiac:  irreg-irreg; no significant murmurs, no rubs, or gallops Lungs: CTA b/l, no wheezing, rhonchi or rales  Abd: soft, nontender MS: no deformity or atrophy Ext: no edema  Skin: warm and dry, no rash Neuro:  No gross deficits appreciated Psych: euthymic mood, full affect   EKG:  Done today and reviewed by myself shows  Afib 60bpm   04/15/2009: TTE Study Conclusions  1. Left ventricle: The cavity size was normal. Wall thickness was   increased in a pattern of mild LVH. Systolic function was normal.   The estimated ejection fraction was in the range of 55% to 60%.   Diastolic function difficult to discern due to atrial   fibrillation but probably mild diastolic dysfunction. Wall motion   was normal; there were no regional wall motion abnormalities.  2. Aortic valve: There was no stenosis.  3. Aorta: Mildly dilated aortic root. Aortic root dimension: 16mm   (ED).  4. Mitral valve: Mildly calcified annulus. Mildly calcified leaflets   . Trivial regurgitation.  5. Left atrium: The atrium was moderately dilated.  6. Right ventricle: The cavity size was normal. Systolic function   was normal.  7. Right atrium: The atrium was mildly to moderately dilated.  8. Pulmonary arteries: No TR doppler jet was measured so unable to   estimate PA systolic pressure.  9. Systemic veins: The IVC measured 2.2 cm with normal respirophasic   variation, suggesting RA pressure 6-10 mmHg.  Impressions:   - The patient was in atrial fibrillation during this study. Normal   LV size with mild LV hypertrophy. EF 55-60%. Regional wall motion   was normal.  Moderate left atrial enlargement and mild to moderate   right atrial enlargement. The RV is normal in size and systolic   function.  Echocardiography. M-mode, complete 2D, spectral Doppler, and color  Doppler. Patient status: Inpatient. Location: Bedside.    Recent Labs: No results found for requested labs within last 8760 hours.  No results found for requested labs within last 8760 hours.   CrCl cannot be calculated (Patient's most recent lab result is older than the maximum 21 days allowed.).   Wt Readings from Last 3 Encounters:  04/18/20 187 lb (84.8 kg)  04/20/19 193 lb (87.5 kg)  02/17/19 188 lb (85.3 kg)     Other studies reviewed: Additional studies/records reviewed today include: summarized above  ASSESSMENT AND PLAN:  1. Permanent Afib     CHA2DS2Vasc is 5, on *Eliquis, appropriately dosed     Asymptomatic     Rate controlled     Having labs done with his PMD in a couple weeks, I asked him to request they send to [place] as well.  Chronic intermittent hematuria, unchanged for years as discussed above  2. HTN     Looks very good!  3. HLD     Followed/managed with his PMD   Disposition: F/u with [place] in a year, sooner if needed    Current medicines are reviewed at length with the patient today.  The patient did not have any concerns regarding medicines.  Venetia Night, Nicholas 04/18/2020 3:29 PM     [address] [place] [id number] [phone] (office)  [phone] (fax)

## 2020-04-18 ENCOUNTER — Other Ambulatory Visit: Payer: Self-pay

## 2020-04-18 ENCOUNTER — Ambulatory Visit: Payer: Medicare HMO | Admitting: Physician Assistant

## 2020-04-18 VITALS — BP 112/56 | HR 60 | Ht 71.0 in | Wt 187.0 lb

## 2020-04-18 DIAGNOSIS — I10 Essential (primary) hypertension: Secondary | ICD-10-CM

## 2020-04-18 DIAGNOSIS — I4821 Permanent atrial fibrillation: Secondary | ICD-10-CM

## 2020-04-18 NOTE — Patient Instructions (Signed)
Medication Instructions:  Your physician recommends that you continue on your current medications as directed. Please refer to the Current Medication list given to you today.  *If you need a refill on your cardiac medications before your next appointment, please call your pharmacy*   Lab Work: NONE ORDERED  TODAY  If you have labs (blood work) drawn today and your tests are completely normal, you will receive your results only by: . MyChart Message (if you have MyChart) OR . A paper copy in the mail If you have any lab test that is abnormal or we need to change your treatment, we will call you to review the results.   Testing/Procedures: NONE ORDERED  TODAY   Follow-Up: At [facility], you and your health needs are our priority.  As part of our continuing mission to provide you with exceptional heart care, we have created designated Provider Care Teams.  These Care Teams include your primary Cardiologist (physician) and Advanced Practice Providers (APPs -  Physician Assistants and Nurse Practitioners) who all work together to provide you with the care you need, when you need it.  We recommend signing up for the patient portal called "MyChart".  Sign up information is provided on this After Visit Summary.  MyChart is used to connect with patients for Virtual Visits (Telemedicine).  Patients are able to view lab/test results, encounter notes, upcoming appointments, etc.  Non-urgent messages can be sent to your provider as well.   To learn more about what you can do with MyChart, go to [email or web address].    Your next appointment:   1 year(s)  The format for your next appointment:   In Person  Provider:   You may see Dr. Klein  or one of the following Advanced Practice Providers on your designated Care Team:    Amber Seiler, NP  Renee Ursuy, PA-C  Michael "Andy" Tillery, PA-C    Other Instructions   

## 2020-04-20 ENCOUNTER — Ambulatory Visit: Payer: Medicare HMO

## 2020-04-27 DIAGNOSIS — C799 Secondary malignant neoplasm of unspecified site: Secondary | ICD-10-CM | POA: Diagnosis not present

## 2020-05-03 DIAGNOSIS — C434 Malignant melanoma of scalp and neck: Secondary | ICD-10-CM | POA: Diagnosis not present

## 2020-05-03 DIAGNOSIS — D485 Neoplasm of uncertain behavior of skin: Secondary | ICD-10-CM | POA: Diagnosis not present

## 2020-05-04 ENCOUNTER — Other Ambulatory Visit: Payer: Self-pay | Admitting: *Deleted

## 2020-05-04 DIAGNOSIS — C434 Malignant melanoma of scalp and neck: Secondary | ICD-10-CM | POA: Diagnosis not present

## 2020-05-04 DIAGNOSIS — C799 Secondary malignant neoplasm of unspecified site: Secondary | ICD-10-CM | POA: Diagnosis not present

## 2020-05-04 DIAGNOSIS — F0390 Unspecified dementia without behavioral disturbance: Secondary | ICD-10-CM

## 2020-05-09 ENCOUNTER — Other Ambulatory Visit: Payer: Self-pay | Admitting: Internal Medicine

## 2020-05-10 ENCOUNTER — Ambulatory Visit: Payer: Medicare Other

## 2020-05-10 ENCOUNTER — Telehealth: Payer: Self-pay | Admitting: Family Medicine

## 2020-05-10 ENCOUNTER — Ambulatory Visit: Payer: Medicare Other | Admitting: Family Medicine

## 2020-05-10 DIAGNOSIS — D539 Nutritional anemia, unspecified: Secondary | ICD-10-CM | POA: Diagnosis not present

## 2020-05-10 DIAGNOSIS — R7989 Other specified abnormal findings of blood chemistry: Secondary | ICD-10-CM | POA: Diagnosis not present

## 2020-05-10 DIAGNOSIS — E871 Hypo-osmolality and hyponatremia: Secondary | ICD-10-CM | POA: Diagnosis not present

## 2020-05-10 DIAGNOSIS — Z79899 Other long term (current) drug therapy: Secondary | ICD-10-CM | POA: Diagnosis not present

## 2020-05-10 DIAGNOSIS — E039 Hypothyroidism, unspecified: Secondary | ICD-10-CM | POA: Diagnosis not present

## 2020-05-10 DIAGNOSIS — C434 Malignant melanoma of scalp and neck: Secondary | ICD-10-CM | POA: Diagnosis not present

## 2020-05-10 DIAGNOSIS — C799 Secondary malignant neoplasm of unspecified site: Secondary | ICD-10-CM | POA: Diagnosis not present

## 2020-05-10 LAB — BASIC METABOLIC PANEL
BUN: 28 — AB (ref 4–21)
CO2: 27 — AB (ref 13–22)
Chloride: 95 — AB (ref 99–108)
Creatinine: 1.5 — AB (ref 0.6–1.3)
Glucose: 167
Potassium: 4.3 (ref 3.4–5.3)
Sodium: 128 — AB (ref 137–147)

## 2020-05-10 LAB — COMPREHENSIVE METABOLIC PANEL
Albumin: 4.5 (ref 3.5–5.0)
Calcium: 10 (ref 8.7–10.7)
GFR calc Af Amer: 53
GFR calc non Af Amer: 45

## 2020-05-10 LAB — HEPATIC FUNCTION PANEL
ALT: 15 (ref 10–40)
AST: 29 (ref 14–40)
Alkaline Phosphatase: 66 (ref 25–125)
Bilirubin, Total: 1.2

## 2020-05-10 NOTE — Progress Notes (Signed)
  Chronic Care Management   Outreach Note  05/10/2020 Name: FLAVIO LINDROTH MRN: [id number] DOB: 22-Feb-1941  Referred by: Vivi Barrack, MD Reason for referral : No chief complaint on file.   An unsuccessful telephone outreach was attempted today. The patient was referred to the pharmacist for assistance with care management and care coordination.   Follow Up Plan:   Earney [place] Upstream Scheduler

## 2020-05-11 ENCOUNTER — Encounter: Payer: Self-pay | Admitting: Family Medicine

## 2020-05-12 ENCOUNTER — Telehealth: Payer: Self-pay | Admitting: Family Medicine

## 2020-05-12 NOTE — Progress Notes (Signed)
°  Chronic Care Management   Outreach Note  05/12/2020 Name: Nicholas Bird MRN: [id number] DOB: [birth year]  Referred by: Vivi Barrack, MD Reason for referral : No chief complaint on file.   An unsuccessful telephone outreach was attempted today. The patient was referred to the pharmacist for assistance with care management and care coordination.   Follow Up Plan:   Earney Hamburg Upstream Scheduler

## 2020-05-13 ENCOUNTER — Telehealth: Payer: Self-pay | Admitting: *Deleted

## 2020-05-13 ENCOUNTER — Encounter: Payer: Self-pay | Admitting: Family Medicine

## 2020-05-13 NOTE — Telephone Encounter (Signed)
LVM please give [place] a call to schedule appt with PCP to follow up Labs

## 2020-05-13 NOTE — Telephone Encounter (Signed)
Pt is going to message dr [name] through my chart

## 2020-05-19 ENCOUNTER — Other Ambulatory Visit: Payer: Self-pay | Admitting: Family Medicine

## 2020-05-26 DIAGNOSIS — I4891 Unspecified atrial fibrillation: Secondary | ICD-10-CM | POA: Diagnosis not present

## 2020-05-26 DIAGNOSIS — C434 Malignant melanoma of scalp and neck: Secondary | ICD-10-CM | POA: Diagnosis not present

## 2020-05-26 DIAGNOSIS — Z8673 Personal history of transient ischemic attack (TIA), and cerebral infarction without residual deficits: Secondary | ICD-10-CM | POA: Diagnosis not present

## 2020-05-26 DIAGNOSIS — Z79899 Other long term (current) drug therapy: Secondary | ICD-10-CM | POA: Diagnosis not present

## 2020-05-29 ENCOUNTER — Other Ambulatory Visit: Payer: Self-pay | Admitting: Internal Medicine

## 2020-05-30 NOTE — Telephone Encounter (Signed)
Prescription refill request for Eliquis received.  Last office visit: Nicholas Bird, 04/18/2020 Scr: 1.5, 05/10/2020 Age: 79 y.o. Weight: 84.8 kg   Prescription refill sent.

## 2020-05-31 DIAGNOSIS — Z85828 Personal history of other malignant neoplasm of skin: Secondary | ICD-10-CM | POA: Diagnosis not present

## 2020-05-31 DIAGNOSIS — C44319 Basal cell carcinoma of skin of other parts of face: Secondary | ICD-10-CM | POA: Diagnosis not present

## 2020-05-31 DIAGNOSIS — Z85820 Personal history of malignant melanoma of skin: Secondary | ICD-10-CM | POA: Diagnosis not present

## 2020-06-06 ENCOUNTER — Ambulatory Visit (INDEPENDENT_AMBULATORY_CARE_PROVIDER_SITE_OTHER): Payer: Medicare HMO | Admitting: Family Medicine

## 2020-06-06 ENCOUNTER — Encounter: Payer: Self-pay | Admitting: Family Medicine

## 2020-06-06 ENCOUNTER — Other Ambulatory Visit: Payer: Self-pay

## 2020-06-06 VITALS — BP 120/80 | HR 67 | Temp 98.0°F | Ht 71.0 in | Wt 191.8 lb

## 2020-06-06 DIAGNOSIS — Z0001 Encounter for general adult medical examination with abnormal findings: Secondary | ICD-10-CM

## 2020-06-06 DIAGNOSIS — C439 Malignant melanoma of skin, unspecified: Secondary | ICD-10-CM | POA: Diagnosis not present

## 2020-06-06 DIAGNOSIS — R739 Hyperglycemia, unspecified: Secondary | ICD-10-CM

## 2020-06-06 DIAGNOSIS — E871 Hypo-osmolality and hyponatremia: Secondary | ICD-10-CM | POA: Diagnosis not present

## 2020-06-06 DIAGNOSIS — I10 Essential (primary) hypertension: Secondary | ICD-10-CM | POA: Diagnosis not present

## 2020-06-06 DIAGNOSIS — E785 Hyperlipidemia, unspecified: Secondary | ICD-10-CM | POA: Diagnosis not present

## 2020-06-06 DIAGNOSIS — M1A9XX0 Chronic gout, unspecified, without tophus (tophi): Secondary | ICD-10-CM

## 2020-06-06 DIAGNOSIS — Z23 Encounter for immunization: Secondary | ICD-10-CM

## 2020-06-06 NOTE — Assessment & Plan Note (Signed)
Check lipids today.  Continue lifestyle modifications. °

## 2020-06-06 NOTE — Patient Instructions (Signed)
It was very nice to see you today!  We will check blood work.  We will give your flu vaccine today.  I will see you back in a year.  Please contact us any sooner if needed.  Take care, Dr [name]  Please try these tips to maintain a healthy lifestyle:   Eat at least 3 REAL meals and 1-2 snacks per day.  Aim for no more than 5 hours between eating.  If you eat breakfast, please do so within one hour of getting up.    Each meal should contain half fruits/vegetables, one quarter protein, and one quarter carbs (no bigger than a computer mouse)   Cut down on sweet beverages. This includes juice, soda, and sweet tea.     Drink at least 1 glass of water with each meal and aim for at least 8 glasses per day   Exercise at least 150 minutes every week.    Preventive Care 78 Years and Older, Male Preventive care refers to lifestyle choices and visits with your health care provider that can promote health and wellness. This includes:  A yearly physical exam. This is also called an annual well check.  Regular dental and eye exams.  Immunizations.  Screening for certain conditions.  Healthy lifestyle choices, such as diet and exercise. What can I expect for my preventive care visit? Physical exam Your health care provider will check:  Height and weight. These may be used to calculate body mass index (BMI), which is a measurement that tells if you are at a healthy weight.  Heart rate and blood pressure.  Your skin for abnormal spots. Counseling Your health care provider may ask you questions about:  Alcohol, tobacco, and drug use.  Emotional well-being.  Home and relationship well-being.  Sexual activity.  Eating habits.  History of falls.  Memory and ability to understand (cognition).  Work and work Statistician. What immunizations do I need?  Influenza (flu) vaccine  This is recommended every year. Tetanus, diphtheria, and pertussis (Tdap) vaccine  You may  need a Td booster every 10 years. Varicella (chickenpox) vaccine  You may need this vaccine if you have not already been vaccinated. Zoster (shingles) vaccine  You may need this after age 78. Pneumococcal conjugate (PCV13) vaccine  One dose is recommended after age 78. Pneumococcal polysaccharide (PPSV23) vaccine  One dose is recommended after age 78. Measles, mumps, and rubella (MMR) vaccine  You may need at least one dose of MMR if you were born in 1957 or later. You may also need a second dose. Meningococcal conjugate (MenACWY) vaccine  You may need this if you have certain conditions. Hepatitis A vaccine  You may need this if you have certain conditions or if you travel or work in places where you may be exposed to hepatitis A. Hepatitis B vaccine  You may need this if you have certain conditions or if you travel or work in places where you may be exposed to hepatitis B. Haemophilus influenzae type b (Hib) vaccine  You may need this if you have certain conditions. You may receive vaccines as individual doses or as more than one vaccine together in one shot (combination vaccines). Talk with your health care provider about the risks and benefits of combination vaccines. What tests do I need? Blood tests  Lipid and cholesterol levels. These may be checked every 5 years, or more frequently depending on your overall health.  Hepatitis C test.  Hepatitis B test. Screening  Lung  cancer screening. You may have this screening every year starting at age 78 if you have a 30-pack-year history of smoking and currently smoke or have quit within the past 15 years.  Colorectal cancer screening. All adults should have this screening starting at age 78 and continuing until age 78. Your health care provider may recommend screening at age 67 if you are at increased risk. You will have tests every 1-10 years, depending on your results and the type of screening test.  Prostate cancer  screening. Recommendations will vary depending on your family history and other risks.  Diabetes screening. This is done by checking your blood sugar (glucose) after you have not eaten for a while (fasting). You may have this done every 1-3 years.  Abdominal aortic aneurysm (AAA) screening. You may need this if you are a current or former smoker.  Sexually transmitted disease (STD) testing. Follow these instructions at home: Eating and drinking  Eat a diet that includes fresh fruits and vegetables, whole grains, lean protein, and low-fat dairy products. Limit your intake of foods with high amounts of sugar, saturated fats, and salt.  Take vitamin and mineral supplements as recommended by your health care provider.  Do not drink alcohol if your health care provider tells you not to drink.  If you drink alcohol: ? Limit how much you have to 0-2 drinks a day. ? Be aware of how much alcohol is in your drink. In the [place] one drink equals one 12 oz bottle of beer (355 mL), one 5 oz glass of wine (148 mL), or one 1 oz glass of hard liquor (44 mL). Lifestyle  Take daily care of your teeth and gums.  Stay active. Exercise for at least 30 minutes on 5 or more days each week.  Do not use any products that contain nicotine or tobacco, such as cigarettes, e-cigarettes, and chewing tobacco. If you need help quitting, ask your health care provider.  If you are sexually active, practice safe sex. Use a condom or other form of protection to prevent STIs (sexually transmitted infections).  Talk with your health care provider about taking a low-dose aspirin or statin. What's next?  Visit your health care provider once a year for a well check visit.  Ask your health care provider how often you should have your eyes and teeth checked.  Stay up to date on all vaccines. This information is not intended to replace advice given to you by your health care provider. Make sure you discuss any questions  you have with your health care provider. Document Revised: 08/14/2018 Document Reviewed: 08/14/2018 Elsevier Patient Education  2020 [employer].

## 2020-06-06 NOTE — Progress Notes (Signed)
Chief Complaint:  Nicholas Bird is a 79 y.o. male who presents today for his annual comprehensive physical exam.    Assessment/Plan:  New/Acute Problems: Hyponatremia Last sodium 128.  Possibly secondary to HCTZ use.  Will recheck today.  If persistently low will likely need to stop HCTZ.  Chronic Problems Addressed Today: HTN (hypertension) At goal.  Continue candesartan-HCTZ 32-12.5 daily and metoprolol succinate 50 mg daily.  We will recheck CMET today.  May need to stop HCTZ if sodium is low.  Gout Check uric acid level.  Continue allopurinol 300 mg daily.  Dyslipidemia Check lipids today.  Continue lifestyle modifications.  Malignant melanoma Continue management per dermatology and oncology.  Preventative Healthcare: Flu vaccine given today.  Will follow GI to discuss colon cancer screening.  Check CBC, CMET, TSH, lipid panel, A1c.  Patient Counseling(The following topics were reviewed and/or handout was given):  -Nutrition: Stressed importance of moderation in sodium/caffeine intake, saturated fat and cholesterol, caloric balance, sufficient intake of fresh fruits, vegetables, and fiber.  -Stressed the importance of regular exercise.   -Substance Abuse: Discussed cessation/primary prevention of tobacco, alcohol, or other drug use; driving or other dangerous activities under the influence; availability of treatment for abuse.   -Injury prevention: Discussed safety belts, safety helmets, smoke detector, smoking near bedding or upholstery.   -Sexuality: Discussed sexually transmitted diseases, partner selection, use of condoms, avoidance of unintended pregnancy and contraceptive alternatives.   -Dental health: Discussed importance of regular tooth brushing, flossing, and dental visits.  -Health maintenance and immunizations reviewed. Please refer to Health maintenance section.  Return to care in 1 year for next preventative visit.     Subjective:  HPI:  He has no acute  complaints today.   Lifestyle Diet: Balanced. Plenty of fruits and vegetables.  Exercise: Tries to do a lot of walking.   Depression screen PHQ 2/9 04/20/2019  Decreased Interest 0  Down, Depressed, Hopeless 0  PHQ - 2 Score 0    Health Maintenance Due  Topic Date Due  . COLONOSCOPY  12/15/2019  . INFLUENZA VACCINE  04/03/2020     ROS: Per HPI, otherwise a complete review of systems was negative.   PMH:  The following were reviewed and entered/updated in epic: Past Medical History:  Diagnosis Date  . Alcohol dependence ([place])   . Alcoholic cirrhosis of liver ([place])   . Anticoagulant long-term use   . Atrial fibrillation, permanent ([place])    [place]  . Bladder diverticulum   . BPH (benign prostatic hypertrophy)   . ED (erectile dysfunction) of organic origin   . Elevated LFTs    CHRONIC  . Gall stones   . Gout    per pt 12-09-2013  gout is stable  . Hematuria   . History of pancreatitis    10/2012--  resolved   . History of TIA (transient ischemic attack)    04/2009--  no residual  . Hyperlipemia   . Hypertension   . Liver disease   . Personal history of colonic polyps 07/12/2008   TUBULAR ADENOMA  . Rosacea    Patient Active Problem List   Diagnosis Date Noted  . Anemia, iron deficiency 04/16/2018  . Malignant melanoma ([place]) 01/01/2015  . Hematuria 07/23/2013  . Polyneuropathy 07/23/2013  . Numbness 07/22/2013  . Erectile dysfunction 11/20/2012  . Encounter for long-term (current) use of other medications 07/09/2012  . GERD 12/07/2008  . BENIGN PROSTATIC HYPERTROPHY, HX OF 11/04/2008  . FEMORAL HERNIA 10/07/2008  .  HEMORRHOIDS 10/06/2008  . Personal history of colonic polyps 10/06/2008  . ECZEMA 06/03/2008  . Dyslipidemia 08/06/2007  . Alcoholism ([place]) 08/06/2007  . HTN (hypertension) 08/06/2007  . Gout 05/14/2007  . Atrial fibrillation ([place]) 05/14/2007   Past Surgical History:  Procedure Laterality Date  . CARDIAC CATHETERIZATION   09-19-1998   false positive stress test---  normal cath and preserved lvf  . CATARACT EXTRACTION W/ INTRAOCULAR LENS  IMPLANT, BILATERAL Bilateral 2008  . CYSTOSCOPY WITH BIOPSY N/A 12/14/2013   Procedure: CYSTOSCOPY WITH BIOPSY;  Surgeon: Franchot Gallo, MD;  Location: [facility];  Service: Urology;  Laterality: N/A;  . INGUINAL HERNIA REPAIR Left 01-14-2001  . LAPAROSCOPIC CHOLECYSTECTOMY  10-21-2008   AND LIVER BX'S  . LAPAROSCOPIC INGUINAL HERNIA REPAIR Bilateral 05-22-2009    Family History  Problem Relation Age of Onset  . Lung cancer Mother   . Skin cancer Sister   . Colon cancer Neg Hx     Medications- reviewed and updated Current Outpatient Medications  Medication Sig Dispense Refill  . allopurinol (ZYLOPRIM) 300 MG tablet TAKE 1 TABLET(300 MG) BY MOUTH DAILY 90 tablet 3  . amLODipine (NORVASC) 5 MG tablet Take 1 tablet (5 mg total) by mouth daily. 90 tablet 0  . candesartan-hydrochlorothiazide (ATACAND HCT) 32-12.5 MG tablet TAKE 1 TABLET BY MOUTH DAILY 90 tablet 0  . ELIQUIS 5 MG TABS tablet TAKE 1 TABLET(5 MG) BY MOUTH TWICE DAILY 60 tablet 5  . ferrous sulfate 325 (65 FE) MG tablet Take 325 mg by mouth 2 (two) times daily with a meal.     . finasteride (PROSCAR) 5 MG tablet Take 5 mg by mouth.    . furosemide (LASIX) 40 MG tablet Take 40 mg by mouth daily.    . metoprolol succinate (TOPROL-XL) 50 MG 24 hr tablet TAKE 1 TABLET BY MOUTH EVERY DAY WITH OR IMMEDIATELY FOLLOWING A MEAL 90 tablet 0  . Multiple Vitamin (MULTI-VITAMINS) TABS Take 1 tablet by mouth daily.      No current facility-administered medications for this visit.    Allergies-reviewed and updated No Known Allergies  Social History   Socioeconomic History  . Marital status: Married    Spouse name: Not on file  . Number of children: 2  . Years of education: Not on file  . Highest education level: Not on file  Occupational History  . Occupation: retired    Fish farm manager: RETIRED    Tobacco Use  . Smoking status: Never Smoker  . Smokeless tobacco: Never Used  Substance and Sexual Activity  . Alcohol use: Yes    Alcohol/week: 14.0 standard drinks    Types: 14 Shots of liquor per week    Comment: 3 oz. per day  . Drug use: No  . Sexual activity: Yes    Partners: Female  Other Topics Concern  . Not on file  Social History Narrative  . Not on file   Social Determinants of Health   Financial Resource Strain:   . Difficulty of Paying Living Expenses: Not on file  Food Insecurity:   . Worried About Charity fundraiser in the Last Year: Not on file  . Ran Out of Food in the Last Year: Not on file  Transportation Needs:   . Lack of Transportation (Medical): Not on file  . Lack of Transportation (Non-Medical): Not on file  Physical Activity:   . Days of Exercise per Week: Not on file  . Minutes of Exercise per Session: Not on  file  Stress:   . Feeling of Stress : Not on file  Social Connections:   . Frequency of Communication with Friends and Family: Not on file  . Frequency of Social Gatherings with Friends and Family: Not on file  . Attends Religious Services: Not on file  . Active Member of Clubs or Organizations: Not on file  . Attends Archivist Meetings: Not on file  . Marital Status: Not on file        Objective:  Physical Exam: BP 120/80   Pulse 67   Temp 98 F (36.7 C) (Temporal)   Ht 5\' 11"  (1.803 m)   Wt 191 lb 12.8 oz (87 kg)   SpO2 100%   BMI 26.75 kg/m   Body mass index is 26.75 kg/m. Wt Readings from Last 3 Encounters:  06/06/20 191 lb 12.8 oz (87 kg)  04/18/20 187 lb (84.8 kg)  04/20/19 193 lb (87.5 kg)   Gen: NAD, resting comfortably HEENT: TMs normal bilaterally. OP clear. No thyromegaly noted.  CV: RRR with no murmurs appreciated Pulm: NWOB, CTAB with no crackles, wheezes, or rhonchi GI: Normal bowel sounds present. Soft, Nontender, Nondistended. MSK: no edema, cyanosis, or clubbing noted Skin: warm,  dry Neuro: CN2-12 grossly intact. Strength 5/5 in upper and lower extremities. Reflexes symmetric and intact bilaterally.  Psych: Normal affect and thought content     Mikella Linsley M. [name], MD 06/06/2020 10:19 AM

## 2020-06-06 NOTE — Assessment & Plan Note (Signed)
Check uric acid level.  Continue allopurinol 300 mg daily. 

## 2020-06-06 NOTE — Assessment & Plan Note (Signed)
At goal.  Continue candesartan-HCTZ 32-12.5 daily and metoprolol succinate 50 mg daily.  We will recheck CMET today.  May need to stop HCTZ if sodium is low.

## 2020-06-06 NOTE — Assessment & Plan Note (Signed)
Continue management per dermatology and oncology.

## 2020-06-07 LAB — LIPID PANEL
Cholesterol: 180 mg/dL (ref <200)
HDL: 60 mg/dL (ref >=40)
LDL Cholesterol (Calc): 99 mg/dL (calc)
Non-HDL Cholesterol (Calc): 120 mg/dL (calc) (ref <130)
Total CHOL/HDL Ratio: 3 (calc) (ref <5.0)
Triglycerides: 113 mg/dL (ref <150)

## 2020-06-07 LAB — COMPREHENSIVE METABOLIC PANEL
AG Ratio: 1.8 (calc) (ref 1.0–2.5)
ALT: 12 U/L (ref 9–46)
AST: 21 U/L (ref 10–35)
Albumin: 4.8 g/dL (ref 3.6–5.1)
Alkaline phosphatase (APISO): 55 U/L (ref 35–144)
BUN/Creatinine Ratio: 28 (calc) — ABNORMAL HIGH (ref 6–22)
BUN: 41 mg/dL — ABNORMAL HIGH (ref 7–25)
CO2: 29 mmol/L (ref 20–32)
Calcium: 10.2 mg/dL (ref 8.6–10.3)
Chloride: 93 mmol/L — ABNORMAL LOW (ref 98–110)
Creat: 1.49 mg/dL — ABNORMAL HIGH (ref 0.70–1.18)
Globulin: 2.6 g/dL (calc) (ref 1.9–3.7)
Glucose, Bld: 108 mg/dL — ABNORMAL HIGH (ref 65–99)
Potassium: 4.7 mmol/L (ref 3.5–5.3)
Sodium: 130 mmol/L — ABNORMAL LOW (ref 135–146)
Total Bilirubin: 1 mg/dL (ref 0.2–1.2)
Total Protein: 7.4 g/dL (ref 6.1–8.1)

## 2020-06-07 LAB — CBC
HCT: 35.6 % — ABNORMAL LOW (ref 38.5–50.0)
Hemoglobin: 12.2 g/dL — ABNORMAL LOW (ref 13.2–17.1)
MCH: 36.3 pg — ABNORMAL HIGH (ref 27.0–33.0)
MCHC: 34.3 g/dL (ref 32.0–36.0)
MCV: 106 fL — ABNORMAL HIGH (ref 80.0–100.0)
MPV: 10.9 fL (ref 7.5–12.5)
Platelets: 221 10*3/uL (ref 140–400)
RBC: 3.36 10*6/uL — ABNORMAL LOW (ref 4.20–5.80)
RDW: 13.4 % (ref 11.0–15.0)
WBC: 6.6 10*3/uL (ref 3.8–10.8)

## 2020-06-07 LAB — HEMOGLOBIN A1C
Hgb A1c MFr Bld: 5.4 % of total Hgb (ref <5.7)
Mean Plasma Glucose: 108 (calc)
eAG (mmol/L): 6 (calc)

## 2020-06-07 LAB — URIC ACID: Uric Acid, Serum: 3.1 mg/dL — ABNORMAL LOW (ref 4.0–8.0)

## 2020-06-07 LAB — TSH: TSH: 2.87 mIU/L (ref 0.40–4.50)

## 2020-06-07 NOTE — Progress Notes (Signed)
Please inform patient of the following:  Blood counts and sodium are low but all stable. Do not need to make any changes to his treatment plan at this time and we can recheck in 6-12 months.  Nicholas Bird. [name], MD 06/07/2020 9:23 AM

## 2020-06-16 DIAGNOSIS — C434 Malignant melanoma of scalp and neck: Secondary | ICD-10-CM | POA: Diagnosis not present

## 2020-06-16 DIAGNOSIS — Z5111 Encounter for antineoplastic chemotherapy: Secondary | ICD-10-CM | POA: Diagnosis not present

## 2020-06-16 DIAGNOSIS — Z79899 Other long term (current) drug therapy: Secondary | ICD-10-CM | POA: Diagnosis not present

## 2020-06-30 DIAGNOSIS — Z5112 Encounter for antineoplastic immunotherapy: Secondary | ICD-10-CM | POA: Diagnosis not present

## 2020-06-30 DIAGNOSIS — C434 Malignant melanoma of scalp and neck: Secondary | ICD-10-CM | POA: Diagnosis not present

## 2020-06-30 DIAGNOSIS — Z79899 Other long term (current) drug therapy: Secondary | ICD-10-CM | POA: Diagnosis not present

## 2020-07-08 ENCOUNTER — Other Ambulatory Visit: Payer: Self-pay | Admitting: Internal Medicine

## 2020-07-09 ENCOUNTER — Other Ambulatory Visit: Payer: Self-pay | Admitting: Internal Medicine

## 2020-07-12 ENCOUNTER — Ambulatory Visit: Payer: Medicare HMO

## 2020-07-12 ENCOUNTER — Telehealth: Payer: Self-pay | Admitting: Family Medicine

## 2020-07-12 NOTE — Chronic Care Management (AMB) (Signed)
  Chronic Care Management   Note  07/12/2020 Name: Nicholas Bird MRN: [id number] DOB: [birth year]  Nicholas Bird is a 79 y.o. year old male who is a primary care patient of Vivi Barrack, MD. I reached out to [name] by phone today in response to a referral sent by [name]'s PCP, Vivi Barrack, MD.   [name] was given information about Chronic Care Management services today including:  1. CCM service includes personalized support from designated clinical staff supervised by his physician, including individualized plan of care and coordination with other care providers 2. 24/7 contact phone numbers for assistance for urgent and routine care needs. 3. Service will only be billed when office clinical staff spend 20 minutes or more in a month to coordinate care. 4. Only one practitioner may furnish and bill the service in a calendar month. 5. The patient may stop CCM services at any time (effective at the end of the month) by phone call to the office staff.   Patient did not agree to enrollment in care management services and does not wish to consider at this time.  Follow up plan:  [place]

## 2020-07-14 DIAGNOSIS — Z79899 Other long term (current) drug therapy: Secondary | ICD-10-CM | POA: Diagnosis not present

## 2020-07-14 DIAGNOSIS — C434 Malignant melanoma of scalp and neck: Secondary | ICD-10-CM | POA: Diagnosis not present

## 2020-07-14 DIAGNOSIS — Z5111 Encounter for antineoplastic chemotherapy: Secondary | ICD-10-CM | POA: Diagnosis not present

## 2020-07-26 ENCOUNTER — Telehealth: Payer: Self-pay

## 2020-07-26 DIAGNOSIS — Z79899 Other long term (current) drug therapy: Secondary | ICD-10-CM | POA: Diagnosis not present

## 2020-07-26 DIAGNOSIS — C434 Malignant melanoma of scalp and neck: Secondary | ICD-10-CM | POA: Diagnosis not present

## 2020-07-26 DIAGNOSIS — Z0389 Encounter for observation for other suspected diseases and conditions ruled out: Secondary | ICD-10-CM | POA: Diagnosis not present

## 2020-07-26 NOTE — Telephone Encounter (Signed)
Patient is called in saying he was exposed to someone who tested positive yesterday, and is wanting to know if he needs to be tested or wait to see if symptoms develop.

## 2020-07-26 NOTE — Telephone Encounter (Signed)
Called and spoke with pt and advised pt that he could wait to see if he develops symptoms b/c getting tested now it would more than likely come back positive. Informed pt that we do testing at our office on Monday and Thursdays and some pharmacies and urgent care do testing as well, pt voiced understanding.

## 2020-07-31 ENCOUNTER — Other Ambulatory Visit: Payer: Self-pay | Admitting: Internal Medicine

## 2020-08-02 DIAGNOSIS — Z85828 Personal history of other malignant neoplasm of skin: Secondary | ICD-10-CM | POA: Diagnosis not present

## 2020-08-02 DIAGNOSIS — L821 Other seborrheic keratosis: Secondary | ICD-10-CM | POA: Diagnosis not present

## 2020-08-02 DIAGNOSIS — C44612 Basal cell carcinoma of skin of right upper limb, including shoulder: Secondary | ICD-10-CM | POA: Diagnosis not present

## 2020-08-02 DIAGNOSIS — D485 Neoplasm of uncertain behavior of skin: Secondary | ICD-10-CM | POA: Diagnosis not present

## 2020-08-02 DIAGNOSIS — L570 Actinic keratosis: Secondary | ICD-10-CM | POA: Diagnosis not present

## 2020-08-02 DIAGNOSIS — C44712 Basal cell carcinoma of skin of right lower limb, including hip: Secondary | ICD-10-CM | POA: Diagnosis not present

## 2020-08-02 DIAGNOSIS — Z85820 Personal history of malignant melanoma of skin: Secondary | ICD-10-CM | POA: Diagnosis not present

## 2020-08-02 DIAGNOSIS — C44722 Squamous cell carcinoma of skin of right lower limb, including hip: Secondary | ICD-10-CM | POA: Diagnosis not present

## 2020-08-02 DIAGNOSIS — L812 Freckles: Secondary | ICD-10-CM | POA: Diagnosis not present

## 2020-08-02 DIAGNOSIS — L850 Acquired ichthyosis: Secondary | ICD-10-CM | POA: Diagnosis not present

## 2020-08-04 ENCOUNTER — Other Ambulatory Visit: Payer: Self-pay | Admitting: Internal Medicine

## 2020-08-04 DIAGNOSIS — C4339 Malignant melanoma of other parts of face: Secondary | ICD-10-CM | POA: Diagnosis not present

## 2020-08-04 DIAGNOSIS — C439 Malignant melanoma of skin, unspecified: Secondary | ICD-10-CM | POA: Diagnosis not present

## 2020-08-12 DIAGNOSIS — H40013 Open angle with borderline findings, low risk, bilateral: Secondary | ICD-10-CM | POA: Diagnosis not present

## 2020-09-16 ENCOUNTER — Ambulatory Visit (INDEPENDENT_AMBULATORY_CARE_PROVIDER_SITE_OTHER): Payer: Medicare HMO

## 2020-09-16 ENCOUNTER — Other Ambulatory Visit: Payer: Self-pay

## 2020-09-16 DIAGNOSIS — Z0000 Encounter for general adult medical examination without abnormal findings: Secondary | ICD-10-CM

## 2020-09-16 NOTE — Progress Notes (Signed)
Virtual Visit via Telephone Note  I connected with  Nicholas Bird on 09/16/20 at  2:30 PM EST by telephone and verified that I am speaking with the correct person using two identifiers.  Medicare Annual Wellness visit completed telephonically due to Covid-19 pandemic.   Persons participating in this call: This Health Coach and this patient and wife [name]  Location: Patient: Home Provider: Office   I discussed the limitations, risks, security and privacy concerns of performing an evaluation and management service by telephone and the availability of in person appointments. The patient expressed understanding and agreed to proceed.  Unable to perform video visit due to video visit attempted and failed and/or patient does not have video capability.   Some vital signs may be absent or patient reported.   [name], LPN    Subjective:   Nicholas Bird is a 80 y.o. male who presents for Medicare Annual/Subsequent preventive examination.  Review of Systems     Cardiac Risk Factors include: advanced age (>38men, >1 women);hypertension;dyslipidemia;male gender     Objective:    There were no vitals filed for this visit. There is no height or weight on file to calculate BMI.  Advanced Directives 09/16/2020 12/05/2017 11/16/2016 12/14/2013  Does Patient Have a Medical Advance Directive? Yes No Yes Patient has advance directive, copy not in chart  Type of Advance Directive [place];Living will - [place];Living will [place]  Does patient want to make changes to medical advance directive? - - - No change requested  Copy of [place] in Chart? No - copy requested - No - copy requested Copy requested from family    Current Medications (verified) Outpatient Encounter Medications as of 09/16/2020  Medication Sig  . allopurinol (ZYLOPRIM) 300 MG tablet TAKE 1 TABLET(300 MG) BY MOUTH DAILY  . amLODipine  (NORVASC) 5 MG tablet TAKE 1 TABLET(5 MG) BY MOUTH DAILY  . candesartan-hydrochlorothiazide (ATACAND HCT) 32-12.5 MG tablet TAKE 1 TABLET BY MOUTH DAILY  . ELIQUIS 5 MG TABS tablet TAKE 1 TABLET(5 MG) BY MOUTH TWICE DAILY  . ferrous sulfate 325 (65 FE) MG tablet Take 325 mg by mouth 2 (two) times daily with a meal.   . finasteride (PROSCAR) 5 MG tablet Take 5 mg by mouth.  . furosemide (LASIX) 40 MG tablet Take 1 tablet (40 mg total) by mouth daily.  . metoprolol succinate (TOPROL-XL) 50 MG 24 hr tablet TAKE 1 TABLET BY MOUTH EVERY DAY WITH OR IMMEDIATELY FOLLOWING A MEAL  . [DISCONTINUED] losartan (COZAAR) 50 MG tablet Take by mouth.  . [DISCONTINUED] Multiple Vitamin (MULTI-VITAMINS) TABS Take 1 tablet by mouth daily.  (Patient not taking: Reported on 09/16/2020)   No facility-administered encounter medications on file as of 09/16/2020.    Allergies (verified) Patient has no known allergies.   History: Past Medical History:  Diagnosis Date  . Alcohol dependence ([place])   . Alcoholic cirrhosis of liver ([place])   . Anticoagulant long-term use   . Atrial fibrillation, permanent ([place])    [place]  . Bladder diverticulum   . BPH (benign prostatic hypertrophy)   . ED (erectile dysfunction) of organic origin   . Elevated LFTs    CHRONIC  . Gall stones   . Gout    per pt 12-09-2013  gout is stable  . Hematuria   . History of pancreatitis    10/2012--  resolved   . History of TIA (transient  ischemic attack)    04/2009--  no residual  . Hyperlipemia   . Hypertension   . Liver disease   . Personal history of colonic polyps 07/12/2008   TUBULAR ADENOMA  . Rosacea    Past Surgical History:  Procedure Laterality Date  . CARDIAC CATHETERIZATION  09-19-1998   false positive stress test---  normal cath and preserved lvf  . CATARACT EXTRACTION W/ INTRAOCULAR LENS  IMPLANT, BILATERAL Bilateral 2008  . CYSTOSCOPY WITH BIOPSY N/A 12/14/2013   Procedure: CYSTOSCOPY WITH BIOPSY;   Surgeon: Franchot Gallo, MD;  Location: [facility];  Service: Urology;  Laterality: N/A;  . INGUINAL HERNIA REPAIR Left 01-14-2001  . LAPAROSCOPIC CHOLECYSTECTOMY  10-21-2008   AND LIVER BX'S  . LAPAROSCOPIC INGUINAL HERNIA REPAIR Bilateral 05-22-2009   Family History  Problem Relation Age of Onset  . Lung cancer Mother   . Skin cancer Sister   . Colon cancer Neg Hx    Social History   Socioeconomic History  . Marital status: Married    Spouse name: Not on file  . Number of children: 2  . Years of education: Not on file  . Highest education level: Not on file  Occupational History  . Occupation: retired    Fish farm manager: RETIRED  Tobacco Use  . Smoking status: Never Smoker  . Smokeless tobacco: Never Used  Substance and Sexual Activity  . Alcohol use: Yes    Alcohol/week: 14.0 standard drinks    Types: 14 Shots of liquor per week    Comment: 3 oz. per day  . Drug use: No  . Sexual activity: Yes    Partners: Female  Other Topics Concern  . Not on file  Social History Narrative  . Not on file   Social Determinants of Health   Financial Resource Strain: Low Risk   . Difficulty of Paying Living Expenses: Not hard at all  Food Insecurity: No Food Insecurity  . Worried About Charity fundraiser in the Last Year: Never true  . Ran Out of Food in the Last Year: Never true  Transportation Needs: No Transportation Needs  . Lack of Transportation (Medical): No  . Lack of Transportation (Non-Medical): No  Physical Activity: Sufficiently Active  . Days of Exercise per Week: 6 days  . Minutes of Exercise per Session: 30 min  Stress: No Stress Concern Present  . Feeling of Stress : Not at all  Social Connections: Socially Integrated  . Frequency of Communication with Friends and Family: More than three times a week  . Frequency of Social Gatherings with Friends and Family: Once a week  . Attends Religious Services: More than 4 times per year  . Active Member  of Clubs or Organizations: Yes  . Attends Archivist Meetings: 1 to 4 times per year  . Marital Status: Married    Tobacco Counseling Counseling given: Not Answered   Clinical Intake:  Pre-visit preparation completed: Yes  Pain : No/denies pain     BMI - recorded: 26.76 Nutritional Status: BMI 25 -29 Overweight Nutritional Risks: None Diabetes: No  How often do you need to have someone help you when you read instructions, pamphlets, or other written materials from your doctor or pharmacy?: 1 - Never  Diabetic?No  Interpreter Needed?: No  Information entered by :: Charlott Rakes, LPN   Activities of Daily Living In your present state of health, do you have any difficulty performing the following activities: 09/16/2020  Hearing? N  Vision? N  Difficulty concentrating or making decisions? Y  Walking or climbing stairs? N  Dressing or bathing? N  Doing errands, shopping? N  Preparing Food and eating ? N  Using the Toilet? N  In the past six months, have you accidently leaked urine? N  Do you have problems with loss of bowel control? N  Managing your Medications? N  Managing your Finances? N  Housekeeping or managing your Housekeeping? N  Some recent data might be hidden    Patient Care Team: Vivi Barrack, MD as PCP - General (Family Medicine) Deboraha Sprang, MD (Cardiology) Franchot Gallo, MD as Attending Physician (Urology) Jarome Matin, MD as Consulting Physician (Dermatology) Katy Apo, MD as Consulting Physician (Ophthalmology) Irene Shipper, MD as Consulting Physician (Gastroenterology) Madelin Rear, [facility] as Pharmacist (Pharmacist)  Indicate any recent Medical Services you may have received from other than [facility] providers in the past year (date may be approximate).     Assessment:   This is a routine wellness examination for [place].  Hearing/Vision screen  Hearing Screening   125Hz  250Hz  500Hz  1000Hz  2000Hz  3000Hz  4000Hz  6000Hz   8000Hz   Right ear:           Left ear:           Comments: Pt denies any hearing loss  Vision Screening Comments: Pt follows up with Dr Katy Apo for annual eye exams  Dietary issues and exercise activities discussed: Current Exercise Habits: Home exercise routine, Type of exercise: walking, Time (Minutes): 25, Frequency (Times/Week): 6, Weekly Exercise (Minutes/Week): 150  Goals    . Patient Stated     Continue to be healthy      Depression Screen PHQ 2/9 Scores 09/16/2020 04/20/2019 05/13/2018  PHQ - 2 Score 0 0 0    Fall Risk Fall Risk  09/16/2020 05/13/2018  Falls in the past year? 0 No  Number falls in past yr: 0 -  Injury with Fall? 0 -  Risk for fall due to : Impaired vision -    FALL RISK PREVENTION PERTAINING TO THE HOME:  Any stairs in or around the home? Yes  If so, are there any without handrails? No  Home free of loose throw rugs in walkways, pet beds, electrical cords, etc? Yes  Adequate lighting in your home to reduce risk of falls? Yes   ASSISTIVE DEVICES UTILIZED TO PREVENT FALLS:  Life alert? No  Use of a cane, walker or w/c? No  Grab bars in the bathroom? Yes  Shower chair or bench in shower? Yes  Elevated toilet seat or a handicapped toilet? Yes   TIMED UP AND GO:  Was the test performed? No .     Cognitive Function:     6CIT Screen 09/16/2020  What Year? 0 points  What month? 0 points  Count back from 20 0 points  Months in reverse 0 points  Repeat phrase 0 points    Immunizations Immunization History  Administered Date(s) Administered  . Fluad Quad(high Dose 65+) 05/19/2019, 06/06/2020  . Influenza Split 07/09/2012, 07/08/2013, 07/19/2016  . Influenza Whole 08/09/2009  . Influenza, High Dose Seasonal PF 06/04/2018  . Influenza-Unspecified 07/05/2015  . PFIZER SARS-COV-2 Vaccination 09/23/2019, 10/14/2019, 06/05/2020  . Pneumococcal Conjugate-13 09/14/2014  . Pneumococcal Polysaccharide-23 07/09/2012  . Td 09/03/2004  . Tdap  09/14/2014    TDAP status: Up to date  Flu Vaccine status: Up to date  Done 06/06/20  Pneumococcal vaccine status: Up to date  Covid-19 vaccine status: Completed vaccines  Qualifies for Shingles Vaccine? Yes   Zostavax completed No   Shingrix Completed?: No.    Education has been provided regarding the importance of this vaccine. Patient has been advised to call insurance company to determine out of pocket expense if they have not yet received this vaccine. Advised may also receive vaccine at local pharmacy or Health Dept. Verbalized acceptance and understanding.  Screening Tests Health Maintenance  Topic Date Due  . COLONOSCOPY (Pts 45-35yrs Insurance coverage will need to be confirmed)  12/15/2019  . COVID-19 Vaccine (4 - Booster for Pfizer series) 12/04/2020  . TETANUS/TDAP  09/14/2024  . INFLUENZA VACCINE  Completed  . Hepatitis C Screening  Completed  . PNA vac Low Risk Adult  Completed    Health Maintenance  Health Maintenance Due  Topic Date Due  . COLONOSCOPY (Pts 45-56yrs Insurance coverage will need to be confirmed)  12/15/2019    Colorectal cancer screening: Type of screening: Colonoscopy. Completed 12/15/14. Repeat every 5 years       Additional Screening:  Hepatitis C Screening: Completed 11/06/06  Vision Screening: Recommended annual ophthalmology exams for early detection of glaucoma and other disorders of the eye. Is the patient up to date with their annual eye exam?  Yes  Who is the provider or what is the name of the office in which the patient attends annual eye exams? Dr Katy Apo    Dental Screening: Recommended annual dental exams for proper oral hygiene  Community Resource Referral / Chronic Care Management: CRR required this visit?  No   CCM required this visit?  No      Plan:     I have personally reviewed and noted the following in the patient's chart:   . Medical and social history . Use of alcohol, tobacco or illicit drugs   . Current medications and supplements . Functional ability and status . Nutritional status . Physical activity . Advanced directives . List of other physicians . Hospitalizations, surgeries, and ER visits in previous 12 months . Vitals . Screenings to include cognitive, depression, and falls . Referrals and appointments  In addition, I have reviewed and discussed with patient certain preventive protocols, quality metrics, and best practice recommendations. A written personalized care plan for preventive services as well as general preventive health recommendations were provided to patient.     [name], LPN   4/33/2951   Nurse Notes: None

## 2020-09-16 NOTE — Patient Instructions (Addendum)
Nicholas Bird , Thank you for taking time to come for your Medicare Wellness Visit. I appreciate your ongoing commitment to your health goals. Please review the following plan we discussed and let me know if I can assist you in the future.   Screening recommendations/referrals: Colonoscopy: Done 12/15/14 Recommended yearly ophthalmology/optometry visit for glaucoma screening and checkup Recommended yearly dental visit for hygiene and checkup  Vaccinations: Influenza vaccine: Done 06/06/20 Up to date Pneumococcal vaccine: Up to date Tdap vaccine: Up to date Shingles vaccine: Shingrix discussed. Please contact your pharmacy for coverage information.    Covid-19: Completed 1/20, 2/10, & 06/05/20  Advanced directives: Please bring a copy of your health care power of attorney and living will to the office at your convenience.   Conditions/risks identified: Continue to stay healthy  Next appointment: Follow up in one year for your annual wellness visit.   Preventive Care 32 Years and Older, Male Preventive care refers to lifestyle choices and visits with your health care provider that can promote health and wellness. What does preventive care include?  A yearly physical exam. This is also called an annual well check.  Dental exams once or twice a year.  Routine eye exams. Ask your health care provider how often you should have your eyes checked.  Personal lifestyle choices, including:  Daily care of your teeth and gums.  Regular physical activity.  Eating a healthy diet.  Avoiding tobacco and drug use.  Limiting alcohol use.  Practicing safe sex.  Taking low doses of aspirin every day.  Taking vitamin and mineral supplements as recommended by your health care provider. What happens during an annual well check? The services and screenings done by your health care provider during your annual well check will depend on your age, overall health, lifestyle risk factors, and family  history of disease. Counseling  Your health care provider may ask you questions about your:  Alcohol use.  Tobacco use.  Drug use.  Emotional well-being.  Home and relationship well-being.  Sexual activity.  Eating habits.  History of falls.  Memory and ability to understand (cognition).  Work and work Statistician. Screening  You may have the following tests or measurements:  Height, weight, and BMI.  Blood pressure.  Lipid and cholesterol levels. These may be checked every 5 years, or more frequently if you are over 61 years old.  Skin check.  Lung cancer screening. You may have this screening every year starting at age 85 if you have a 30-pack-year history of smoking and currently smoke or have quit within the past 15 years.  Fecal occult blood test (FOBT) of the stool. You may have this test every year starting at age 17.  Flexible sigmoidoscopy or colonoscopy. You may have a sigmoidoscopy every 5 years or a colonoscopy every 10 years starting at age 4.  Prostate cancer screening. Recommendations will vary depending on your family history and other risks.  Hepatitis C blood test.  Hepatitis B blood test.  Sexually transmitted disease (STD) testing.  Diabetes screening. This is done by checking your blood sugar (glucose) after you have not eaten for a while (fasting). You may have this done every 1-3 years.  Abdominal aortic aneurysm (AAA) screening. You may need this if you are a current or former smoker.  Osteoporosis. You may be screened starting at age 57 if you are at high risk. Talk with your health care provider about your test results, treatment options, and if necessary, the need for more  tests. Vaccines  Your health care provider may recommend certain vaccines, such as:  Influenza vaccine. This is recommended every year.  Tetanus, diphtheria, and acellular pertussis (Tdap, Td) vaccine. You may need a Td booster every 10 years.  Zoster vaccine.  You may need this after age 26.  Pneumococcal 13-valent conjugate (PCV13) vaccine. One dose is recommended after age 64.  Pneumococcal polysaccharide (PPSV23) vaccine. One dose is recommended after age 64. Talk to your health care provider about which screenings and vaccines you need and how often you need them. This information is not intended to replace advice given to you by your health care provider. Make sure you discuss any questions you have with your health care provider. Document Released: 09/16/2015 Document Revised: 05/09/2016 Document Reviewed: 06/21/2015 Elsevier Interactive Patient Education  2017 [employer] Prevention in the Home Falls can cause injuries. They can happen to people of all ages. There are many things you can do to make your home safe and to help prevent falls. What can I do on the outside of my home?  Regularly fix the edges of walkways and driveways and fix any cracks.  Remove anything that might make you trip as you walk through a door, such as a raised step or threshold.  Trim any bushes or trees on the path to your home.  Use bright outdoor lighting.  Clear any walking paths of anything that might make someone trip, such as rocks or tools.  Regularly check to see if handrails are loose or broken. Make sure that both sides of any steps have handrails.  Any raised decks and porches should have guardrails on the edges.  Have any leaves, snow, or ice cleared regularly.  Use sand or salt on walking paths during winter.  Clean up any spills in your garage right away. This includes oil or grease spills. What can I do in the bathroom?  Use night lights.  Install grab bars by the toilet and in the tub and shower. Do not use towel bars as grab bars.  Use non-skid mats or decals in the tub or shower.  If you need to sit down in the shower, use a plastic, non-slip stool.  Keep the floor dry. Clean up any water that spills on the floor as soon  as it happens.  Remove soap buildup in the tub or shower regularly.  Attach bath mats securely with double-sided non-slip rug tape.  Do not have throw rugs and other things on the floor that can make you trip. What can I do in the bedroom?  Use night lights.  Make sure that you have a light by your bed that is easy to reach.  Do not use any sheets or blankets that are too big for your bed. They should not hang down onto the floor.  Have a firm chair that has side arms. You can use this for support while you get dressed.  Do not have throw rugs and other things on the floor that can make you trip. What can I do in the kitchen?  Clean up any spills right away.  Avoid walking on wet floors.  Keep items that you use a lot in easy-to-reach places.  If you need to reach something above you, use a strong step stool that has a grab bar.  Keep electrical cords out of the way.  Do not use floor polish or wax that makes floors slippery. If you must use wax, use non-skid floor  wax.  Do not have throw rugs and other things on the floor that can make you trip. What can I do with my stairs?  Do not leave any items on the stairs.  Make sure that there are handrails on both sides of the stairs and use them. Fix handrails that are broken or loose. Make sure that handrails are as long as the stairways.  Check any carpeting to make sure that it is firmly attached to the stairs. Fix any carpet that is loose or worn.  Avoid having throw rugs at the top or bottom of the stairs. If you do have throw rugs, attach them to the floor with carpet tape.  Make sure that you have a light switch at the top of the stairs and the bottom of the stairs. If you do not have them, ask someone to add them for you. What else can I do to help prevent falls?  Wear shoes that:  Do not have high heels.  Have rubber bottoms.  Are comfortable and fit you well.  Are closed at the toe. Do not wear sandals.  If  you use a stepladder:  Make sure that it is fully opened. Do not climb a closed stepladder.  Make sure that both sides of the stepladder are locked into place.  Ask someone to hold it for you, if possible.  Clearly mark and make sure that you can see:  Any grab bars or handrails.  First and last steps.  Where the edge of each step is.  Use tools that help you move around (mobility aids) if they are needed. These include:  Canes.  Walkers.  Scooters.  Crutches.  Turn on the lights when you go into a dark area. Replace any light bulbs as soon as they burn out.  Set up your furniture so you have a clear path. Avoid moving your furniture around.  If any of your floors are uneven, fix them.  If there are any pets around you, be aware of where they are.  Review your medicines with your doctor. Some medicines can make you feel dizzy. This can increase your chance of falling. Ask your doctor what other things that you can do to help prevent falls. This information is not intended to replace advice given to you by your health care provider. Make sure you discuss any questions you have with your health care provider. Document Released: 06/16/2009 Document Revised: 01/26/2016 Document Reviewed: 09/24/2014 Elsevier Interactive Patient Education  2017 [employer].

## 2020-10-17 DIAGNOSIS — C434 Malignant melanoma of scalp and neck: Secondary | ICD-10-CM | POA: Diagnosis not present

## 2020-11-03 DIAGNOSIS — Z85820 Personal history of malignant melanoma of skin: Secondary | ICD-10-CM | POA: Diagnosis not present

## 2020-11-03 DIAGNOSIS — D485 Neoplasm of uncertain behavior of skin: Secondary | ICD-10-CM | POA: Diagnosis not present

## 2020-11-03 DIAGNOSIS — L570 Actinic keratosis: Secondary | ICD-10-CM | POA: Diagnosis not present

## 2020-11-03 DIAGNOSIS — Z85828 Personal history of other malignant neoplasm of skin: Secondary | ICD-10-CM | POA: Diagnosis not present

## 2020-11-03 DIAGNOSIS — D045 Carcinoma in situ of skin of trunk: Secondary | ICD-10-CM | POA: Diagnosis not present

## 2020-11-03 DIAGNOSIS — D225 Melanocytic nevi of trunk: Secondary | ICD-10-CM | POA: Diagnosis not present

## 2020-11-03 DIAGNOSIS — L814 Other melanin hyperpigmentation: Secondary | ICD-10-CM | POA: Diagnosis not present

## 2020-11-21 DIAGNOSIS — N323 Diverticulum of bladder: Secondary | ICD-10-CM | POA: Diagnosis not present

## 2020-11-21 DIAGNOSIS — R310 Gross hematuria: Secondary | ICD-10-CM | POA: Diagnosis not present

## 2020-12-22 ENCOUNTER — Other Ambulatory Visit: Payer: Self-pay | Admitting: Internal Medicine

## 2020-12-22 NOTE — Telephone Encounter (Signed)
Prescription refill request for Eliquis received. Indication: Atrial Fib Last office visit: 04/18/20 Scr: 1.49 on 06/06/20 Age: 80 Weight: 84.8  Based on above findings Eliquis 5mg  twice daily is the appropriate dose.  Refill approved.

## 2021-01-24 DIAGNOSIS — Z79899 Other long term (current) drug therapy: Secondary | ICD-10-CM | POA: Diagnosis not present

## 2021-01-24 DIAGNOSIS — C434 Malignant melanoma of scalp and neck: Secondary | ICD-10-CM | POA: Diagnosis not present

## 2021-01-25 DIAGNOSIS — C434 Malignant melanoma of scalp and neck: Secondary | ICD-10-CM | POA: Diagnosis not present

## 2021-02-19 NOTE — Progress Notes (Signed)
PCP:  Vivi Barrack, MD Primary Cardiologist: None Electrophysiologist: Virl Axe, MD   Nicholas Bird is a 80 y.o. male seen today for Virl Axe, MD for routine electrophysiology followup.  Since last being seen in our clinic the patient reports doing OK. He states for the past 12-18 months he has noticed a decrease in his exercise tolerance. He plays 9 holes instead of 18, though notes the majority of his golf group has taken to doing the same. He denies SOB or fatigue on flat ground. He has mild SOB and fatigue walking up stairs or incline. He takes his lasix every other day with extra as needed for ankle edema. he denies chest pain, palpitations, PND, orthopnea, nausea, vomiting, dizziness, syncope, weight gain, or early satiety.  Past Medical History:  Diagnosis Date   Alcohol dependence ([place])    Alcoholic cirrhosis of liver ([place])    Anticoagulant long-term use    Atrial fibrillation, permanent ([place])    CARIOLOGIST-  DR Caryl Comes   Bladder diverticulum    BPH (benign prostatic hypertrophy)    ED (erectile dysfunction) of organic origin    Elevated LFTs    CHRONIC   Gall stones    Gout    per pt 12-09-2013  gout is stable   Hematuria    History of pancreatitis    10/2012--  resolved    History of TIA (transient ischemic attack)    04/2009--  no residual   Hyperlipemia    Hypertension    Liver disease    Personal history of colonic polyps 07/12/2008   TUBULAR ADENOMA   Rosacea    Past Surgical History:  Procedure Laterality Date   CARDIAC CATHETERIZATION  09-19-1998   false positive stress test---  normal cath and preserved lvf   CATARACT EXTRACTION W/ INTRAOCULAR LENS  IMPLANT, BILATERAL Bilateral 2008   CYSTOSCOPY WITH BIOPSY N/A 12/14/2013   Procedure: CYSTOSCOPY WITH BIOPSY;  Surgeon: Franchot Gallo, MD;  Location: [facility];  Service: Urology;  Laterality: N/A;   INGUINAL HERNIA REPAIR Left 01-14-2001   LAPAROSCOPIC CHOLECYSTECTOMY  10-21-2008    AND LIVER BX'S   LAPAROSCOPIC INGUINAL HERNIA REPAIR Bilateral 05-22-2009    Current Outpatient Medications  Medication Sig Dispense Refill   allopurinol (ZYLOPRIM) 300 MG tablet TAKE 1 TABLET(300 MG) BY MOUTH DAILY 90 tablet 3   amLODipine (NORVASC) 5 MG tablet TAKE 1 TABLET(5 MG) BY MOUTH DAILY 90 tablet 2   ampicillin (PRINCIPEN) 500 MG capsule Take 500 mg by mouth 2 (two) times daily. For 30 days     candesartan-hydrochlorothiazide (ATACAND HCT) 32-12.5 MG tablet TAKE 1 TABLET BY MOUTH DAILY 90 tablet 2   ELIQUIS 5 MG TABS tablet TAKE 1 TABLET(5 MG) BY MOUTH TWICE DAILY 60 tablet 2   ferrous sulfate 325 (65 FE) MG tablet Take 325 mg by mouth 2 (two) times daily with a meal.      finasteride (PROSCAR) 5 MG tablet Take 5 mg by mouth.     furosemide (LASIX) 40 MG tablet Take 1 tablet (40 mg total) by mouth daily. (Patient taking differently: Take 40 mg by mouth every other day.) 90 tablet 2   metoprolol succinate (TOPROL-XL) 50 MG 24 hr tablet TAKE 1 TABLET BY MOUTH EVERY DAY WITH OR IMMEDIATELY FOLLOWING A MEAL 90 tablet 2   No current facility-administered medications for this visit.    No Known Allergies  Social History   Socioeconomic History   Marital status: Married  Spouse name: Not on file   Number of children: 2   Years of education: Not on file   Highest education level: Not on file  Occupational History   Occupation: retired    Fish farm manager: RETIRED  Tobacco Use   Smoking status: Never   Smokeless tobacco: Never  Substance and Sexual Activity   Alcohol use: Yes    Alcohol/week: 14.0 standard drinks    Types: 14 Shots of liquor per week    Comment: 3 oz. per day   Drug use: No   Sexual activity: Yes    Partners: Female  Other Topics Concern   Not on file  Social History Narrative   Not on file   Social Determinants of Health   Financial Resource Strain: Low Risk    Difficulty of Paying Living Expenses: Not hard at all  Food Insecurity: No Food  Insecurity   Worried About Charity fundraiser in the Last Year: Never true   [place] in the Last Year: Never true  Transportation Needs: No Transportation Needs   Lack of Transportation (Medical): No   Lack of Transportation (Non-Medical): No  Physical Activity: Sufficiently Active   Days of Exercise per Week: 6 days   Minutes of Exercise per Session: 30 min  Stress: No Stress Concern Present   Feeling of Stress : Not at all  Social Connections: Socially Integrated   Frequency of Communication with Friends and Family: More than three times a week   Frequency of Social Gatherings with Friends and Family: Once a week   Attends Religious Services: More than 4 times per year   Active Member of [employer] or Organizations: Yes   Attends Archivist Meetings: 1 to 4 times per year   Marital Status: Married  Human resources officer Violence: Not At Risk   Fear of Current or Ex-Partner: No   Emotionally Abused: No   Physically Abused: No   Sexually Abused: No     Review of Systems: All other systems reviewed and are otherwise negative except as noted above.  Physical Exam: Vitals:   02/20/21 0842  BP: 116/68  Pulse: 68  SpO2: 99%  Weight: 194 lb (88 kg)  Height: 5\' 11"  (1.803 m)    GEN- The patient is well appearing, alert and oriented x 3 today.   HEENT: normocephalic, atraumatic; sclera clear, conjunctiva pink; hearing intact; oropharynx clear; neck supple, no JVP Lymph- no cervical lymphadenopathy Lungs- Clear to ausculation bilaterally, normal work of breathing.  No wheezes, rales, rhonchi Heart- Regular rate and rhythm, no murmurs, rubs or gallops, PMI not laterally displaced GI- soft, non-tender, non-distended, bowel sounds present, no hepatosplenomegaly Extremities- no clubbing, cyanosis, or edema; DP/PT/radial pulses 2+ bilaterally MS- no significant deformity or atrophy Skin- warm and dry, no rash or lesion Psych- euthymic mood, full affect Neuro- strength and  sensation are intact  EKG is not ordered.   Additional studies reviewed include: Previous EP office notes  Assessment and Plan:  1. Permanent AF Continue eliquis for CHA2DS2/VASc of at least 5.  Rates controlled Labs today.  2. HTN Stable  3. HLD Followed/managed by his PCP  4. Fatigue/Dyspnea on Exertion Chronic component > 1 year Labs today.  ? Anemia related. Denies worsening of his chronic intermittent hematuria. On iron supp Will obtain echo with permanent AF. None on file.  Also seems to be related to heat/humidity. Encouraged him to HOLD lasix on the ams where he is going to be out playing  golf in the summer heat.   RTC 6 months with Dr. Caryl Comes. Sooner pending results.   Shirley Friar, PA-C  02/20/21 8:49 AM

## 2021-02-20 ENCOUNTER — Ambulatory Visit: Payer: Medicare HMO | Admitting: Student

## 2021-02-20 ENCOUNTER — Other Ambulatory Visit: Payer: Self-pay

## 2021-02-20 ENCOUNTER — Encounter: Payer: Self-pay | Admitting: Student

## 2021-02-20 VITALS — BP 116/68 | HR 68 | Ht 71.0 in | Wt 194.0 lb

## 2021-02-20 DIAGNOSIS — R5383 Other fatigue: Secondary | ICD-10-CM

## 2021-02-20 DIAGNOSIS — I4821 Permanent atrial fibrillation: Secondary | ICD-10-CM | POA: Diagnosis not present

## 2021-02-20 DIAGNOSIS — I10 Essential (primary) hypertension: Secondary | ICD-10-CM | POA: Diagnosis not present

## 2021-02-20 LAB — CBC
Hematocrit: 32.1 % — ABNORMAL LOW (ref 37.5–51.0)
Hemoglobin: 11.3 g/dL — ABNORMAL LOW (ref 13.0–17.7)
MCH: 36.7 pg — ABNORMAL HIGH (ref 26.6–33.0)
MCHC: 35.2 g/dL (ref 31.5–35.7)
MCV: 104 fL — ABNORMAL HIGH (ref 79–97)
Platelets: 204 10*3/uL (ref 150–450)
RBC: 3.08 x10E6/uL — ABNORMAL LOW (ref 4.14–5.80)
RDW: 13.4 % (ref 11.6–15.4)
WBC: 6.7 10*3/uL (ref 3.4–10.8)

## 2021-02-20 LAB — COMPREHENSIVE METABOLIC PANEL
ALT: 15 IU/L (ref 0–44)
AST: 18 IU/L (ref 0–40)
Albumin/Globulin Ratio: 2 (ref 1.2–2.2)
Albumin: 4.6 g/dL (ref 3.7–4.7)
Alkaline Phosphatase: 65 IU/L (ref 44–121)
BUN/Creatinine Ratio: 18 (ref 10–24)
BUN: 24 mg/dL (ref 8–27)
Bilirubin Total: 0.6 mg/dL (ref 0.0–1.2)
CO2: 22 mmol/L (ref 20–29)
Calcium: 9.6 mg/dL (ref 8.6–10.2)
Chloride: 95 mmol/L — ABNORMAL LOW (ref 96–106)
Creatinine, Ser: 1.35 mg/dL — ABNORMAL HIGH (ref 0.76–1.27)
Globulin, Total: 2.3 g/dL (ref 1.5–4.5)
Glucose: 111 mg/dL — ABNORMAL HIGH (ref 65–99)
Potassium: 4.9 mmol/L (ref 3.5–5.2)
Sodium: 130 mmol/L — ABNORMAL LOW (ref 134–144)
Total Protein: 6.9 g/dL (ref 6.0–8.5)
eGFR: 53 mL/min/{1.73_m2} — ABNORMAL LOW (ref >59)

## 2021-02-20 LAB — TSH: TSH: 3.13 u[IU]/mL (ref 0.450–4.500)

## 2021-02-20 NOTE — Patient Instructions (Signed)
Medication Instructions:  Your physician recommends that you continue on your current medications as directed. Please refer to the Current Medication list given to you today.  *If you need a refill on your cardiac medications before your next appointment, please call your pharmacy*   Lab Work: TODAY: CMET, CBC, TSH  If you have labs (blood work) drawn today and your tests are completely normal, you will receive your results only by: [place] (if you have MyChart) OR A paper copy in the mail If you have any lab test that is abnormal or we need to change your treatment, we will call you to review the results.   Testing/Procedures: Your physician has requested that you have an echocardiogram. Echocardiography is a painless test that uses sound waves to create images of your heart. It provides your doctor with information about the size and shape of your heart and how well your heart's chambers and valves are working. This procedure takes approximately one hour. There are no restrictions for this procedure.   Follow-Up: At [facility], you and your health needs are our priority.  As part of our continuing mission to provide you with exceptional heart care, we have created designated Provider Care Teams.  These Care Teams include your primary Cardiologist (physician) and Advanced Practice Providers (APPs -  Physician Assistants and Nurse Practitioners) who all work together to provide you with the care you need, when you need it.   Your next appointment:   6 month(s)  The format for your next appointment:   In Person  Provider:   You may see Virl Axe, MD or one of the following Advanced Practice Providers on your designated Care Team:    Tommye Standard, [place] "[facility]" [place], [place]

## 2021-03-16 ENCOUNTER — Other Ambulatory Visit: Payer: Self-pay

## 2021-03-16 ENCOUNTER — Ambulatory Visit (HOSPITAL_COMMUNITY): Payer: Medicare HMO | Attending: Cardiology

## 2021-03-16 DIAGNOSIS — I4821 Permanent atrial fibrillation: Secondary | ICD-10-CM | POA: Diagnosis not present

## 2021-03-16 LAB — ECHOCARDIOGRAM COMPLETE
Area-P 1/2: 3.71 cm2
MV M vel: 4.41 m/s
MV Peak grad: 77.8 mmHg
P 1/2 time: 466 msec
Radius: 0.5 cm
S' Lateral: 2.9 cm

## 2021-03-21 ENCOUNTER — Encounter: Payer: Self-pay | Admitting: Internal Medicine

## 2021-03-21 ENCOUNTER — Telehealth: Payer: Self-pay

## 2021-03-21 ENCOUNTER — Ambulatory Visit: Payer: Medicare HMO | Admitting: Internal Medicine

## 2021-03-21 VITALS — BP 110/62 | HR 74 | Ht 71.0 in | Wt 193.5 lb

## 2021-03-21 DIAGNOSIS — R935 Abnormal findings on diagnostic imaging of other abdominal regions, including retroperitoneum: Secondary | ICD-10-CM

## 2021-03-21 DIAGNOSIS — Z7901 Long term (current) use of anticoagulants: Secondary | ICD-10-CM

## 2021-03-21 DIAGNOSIS — Z86010 Personal history of colonic polyps: Secondary | ICD-10-CM | POA: Diagnosis not present

## 2021-03-21 DIAGNOSIS — K7460 Unspecified cirrhosis of liver: Secondary | ICD-10-CM | POA: Diagnosis not present

## 2021-03-21 MED ORDER — SUTAB 1479-225-188 MG PO TABS: 1.0000 | ORAL_TABLET | Freq: Once | ORAL | 0 refills | Status: AC

## 2021-03-21 MED ORDER — SUTAB 1479-225-188 MG PO TABS: 1.0000 | ORAL_TABLET | Freq: Once | ORAL | 0 refills | Status: DC

## 2021-03-21 NOTE — Telephone Encounter (Signed)
Patient with diagnosis of afib on Eliquis for anticoagulation.    Procedure: Endo/colon Date of procedure: 04/10/21  CHA2DS2-VASc Score = 5  This indicates a 7.2% annual risk of stroke. The patient's score is based upon: CHF History: No HTN History: Yes Diabetes History: No Stroke History: Yes Vascular Disease History: No Age Score: 2 Gender Score: 0     CrCl 47 ml/min Platelet count 204  Patient with a hx of TIA. I would recommend holding Eliquis 1 day prior to procedure. MD is requesting a 3 day hold. I will defer to Dr. Caryl Comes.

## 2021-03-21 NOTE — Telephone Encounter (Signed)
[facility] Pre-operative Risk Assessment     Request for surgical clearance:     Endoscopy Procedure  What type of surgery is being performed?     Endo/colon  When is this surgery scheduled?     04/10/2021  What type of clearance is required ?   Pharmacy  Are there any medications that need to be held prior to surgery and how long? Eliquis - 3 days  Practice name and name of physician performing surgery?      [facility]  What is your office phone and fax number?      Phone- [phone]  Fax586 [phone]  Anesthesia type (None, local, MAC, general) ?       MAC

## 2021-03-21 NOTE — Patient Instructions (Signed)
If you are age 79 or older, your body mass index should be between 23-30. Your Body mass index is 26.99 kg/m. If this is out of the aforementioned range listed, please consider follow up with your Primary Care Provider.  If you are age 94 or younger, your body mass index should be between 19-25. Your Body mass index is 26.99 kg/m. If this is out of the aformentioned range listed, please consider follow up with your Primary Care Provider.   __________________________________________________________  The [facility] providers would like to encourage you to use [facility] to communicate with providers for non-urgent requests or questions.  Due to long hold times on the telephone, sending your provider a message by [facility] may be a faster and more efficient way to get a response.  Please allow 48 business hours for a response.  Please remember that this is for non-urgent requests.   You have been scheduled for an endoscopy and colonoscopy. Please follow the written instructions given to you at your visit today. Please pick up your prep supplies at the pharmacy within the next 1-3 days. If you use inhalers (even only as needed), please bring them with you on the day of your procedure.

## 2021-03-22 ENCOUNTER — Other Ambulatory Visit: Payer: Self-pay | Admitting: Internal Medicine

## 2021-03-22 ENCOUNTER — Encounter: Payer: Self-pay | Admitting: Internal Medicine

## 2021-03-22 NOTE — Telephone Encounter (Signed)
Prescription refill request for Eliquis received.  Indication: afib  Last office visit: [place], 02/20/2021 Scr: 1.35, 02/20/2021 Age: 80 yo  Weight: 87.8 kg   Pt is on the correct dose of Eliquis per dosing criteria, prescription refill sent for Eliquis 5mg  BID.

## 2021-03-22 NOTE — Progress Notes (Signed)
HISTORY OF PRESENT ILLNESS:  Nicholas Bird is a 80 y.o. male with multiple medical problems including hypertension, hyperlipidemia, atrial fibrillation on Eliquis, hepatic cirrhosis secondary to alcohol, history of adenomatous colon polyps, and melanoma.  Patient was last seen in this office June 2018 regarding diverticulitis.  See that dictation.  He last underwent complete colonoscopy April 2016.  At that time he was found to have 2 diminutive adenomas and moderate sigmoid diverticulosis.  Follow-up in 5 years recommended.  He was on Eliquis at that time, which was temporarily held.  His last upper Endoscopy was performed to screen for esophageal varices.  This was also performed April 2016.  No varices.  Repeat endoscopy in 3 years recommended.  Patient tells me that he underwent PET/CT imaging arranged by his oncologist at [facility], who treats his melanoma.  He brings a copy of the report.  Though there were no foci of increased radiotracer uptake to suggest metastatic disease, there was a small focus of increased uptake within the proximal sigmoid colon without other abnormality for which colonoscopy was recommended.  Review of blood work from February 20 2021 revealed sodium 130, creatinine 1.35, GFR 53, normal liver tests with bilirubin 0.6.  Also, unremarkable CBC.  Hemoglobin 11.3.  MCV 100.4.  Platelet count 204,000.  REVIEW OF SYSTEMS:  All non-GI ROS negative unless otherwise stated in the HPI except for hematuria  Past Medical History:  Diagnosis Date   Alcohol dependence ([place])    Alcoholic cirrhosis of liver ([place])    Anticoagulant long-term use    Atrial fibrillation, permanent ([place])    CARIOLOGIST-  DR Caryl Comes   Bladder diverticulum    BPH (benign prostatic hypertrophy)    ED (erectile dysfunction) of organic origin    Elevated LFTs    CHRONIC   Gall stones    Gout    per pt 12-09-2013  gout is stable   Hematuria    History of pancreatitis    10/2012--  resolved    History of TIA  (transient ischemic attack)    04/2009--  no residual   Hyperlipemia    Hypertension    Liver disease    Melanoma ([place])    Personal history of colonic polyps 07/12/2008   TUBULAR ADENOMA   Rosacea     Past Surgical History:  Procedure Laterality Date   CARDIAC CATHETERIZATION  09-19-1998   false positive stress test---  normal cath and preserved lvf   CATARACT EXTRACTION W/ INTRAOCULAR LENS  IMPLANT, BILATERAL Bilateral 2008   CYSTOSCOPY WITH BIOPSY N/A 12/14/2013   Procedure: CYSTOSCOPY WITH BIOPSY;  Surgeon: Franchot Gallo, MD;  Location: [facility];  Service: Urology;  Laterality: N/A;   INGUINAL HERNIA REPAIR Left 01-14-2001   LAPAROSCOPIC CHOLECYSTECTOMY  10-21-2008   AND LIVER BX'S   LAPAROSCOPIC INGUINAL HERNIA REPAIR Bilateral 05-22-2009    Social History Nicholas Bird  reports that he has never smoked. He has never used smokeless tobacco. He reports current alcohol use of about 14.0 standard drinks of alcohol per week. He reports that he does not use drugs.  family history includes Lung cancer in his mother; Skin cancer in his sister.  No Known Allergies     PHYSICAL EXAMINATION: Vital signs: BP 110/62 (BP Location: Left Arm, Patient Position: Sitting, Cuff Size: Normal)   Pulse 74   Ht 5\' 11"  (1.803 m)   Wt 193 lb 8 oz (87.8 kg)   SpO2 100%   BMI 26.99 kg/m  Constitutional: generally well-appearing, no acute distress Psychiatric: alert and oriented x3, cooperative Eyes: extraocular movements intact, anicteric, conjunctiva pink Mouth: oral pharynx moist, no lesions Neck: supple no lymphadenopathy Cardiovascular: heart regular rate and rhythm, no murmur Lungs: clear to auscultation bilaterally Abdomen: soft, nontender, nondistended, no obvious ascites, no peritoneal signs, normal bowel sounds, no organomegaly Rectal: Deferred until colonoscopy Extremities: no clubbing, cyanosis, or lower extremity edema bilaterally Skin: no lesions on  visible extremities Neuro: No focal deficits. No asterixis.     ASSESSMENT:  1.  Abnormal CT pet imaging in the region of the sigmoid colon as described.  Suspect related to his known diverticular disease. 2.  History of adenomatous colon polyps.  Last colonoscopy April 2016.  Overdue for follow-up 3.  History of diverticulitis 4.  Hepatic cirrhosis secondary to alcohol.  Compensated.  Last screening upper endoscopy 2016.  Overdue for follow-up 5.  Multiple medical problems including atrial fibrillation on Eliquis   PLAN:  1.  Schedule colonoscopy for colorectal neoplasia surveillance and to clarify abnormal CT findings.  The patient is HIGH RISK given his comorbidities and the need to address anticoagulation therapy as previous.The nature of the procedure, as well as the risks, benefits, and alternatives were carefully and thoroughly reviewed with the patient. Ample time for discussion and questions allowed. The patient understood, was satisfied, and agreed to proceed.  2.  Schedule upper endoscopy for esophageal variceal screening in a patient on chronic anticoagulation.  The patient is high risk as above.The nature of the procedure, as well as the risks, benefits, and alternatives were carefully and thoroughly reviewed with the patient. Ample time for discussion and questions allowed. The patient understood, was satisfied, and agreed to proceed.  A total time of 60 minutes was spent preparing to see the patient, reviewing laboratories and x-rays, obtaining comprehensive history, performing medically appropriate physical examination, counseling the patient regarding the above listed issues, ordering advanced procedures, coordinating with his cardiologist regarding management of his chronic anticoagulation, and documenting clinical information in the health record

## 2021-03-24 NOTE — Telephone Encounter (Signed)
Dr. Henrene Pastor @ [facility] is doing the procedure.

## 2021-03-27 DIAGNOSIS — L821 Other seborrheic keratosis: Secondary | ICD-10-CM | POA: Diagnosis not present

## 2021-03-27 DIAGNOSIS — D692 Other nonthrombocytopenic purpura: Secondary | ICD-10-CM | POA: Diagnosis not present

## 2021-03-27 DIAGNOSIS — L570 Actinic keratosis: Secondary | ICD-10-CM | POA: Diagnosis not present

## 2021-03-27 DIAGNOSIS — B351 Tinea unguium: Secondary | ICD-10-CM | POA: Diagnosis not present

## 2021-03-27 DIAGNOSIS — Z85828 Personal history of other malignant neoplasm of skin: Secondary | ICD-10-CM | POA: Diagnosis not present

## 2021-03-27 DIAGNOSIS — L853 Xerosis cutis: Secondary | ICD-10-CM | POA: Diagnosis not present

## 2021-03-27 DIAGNOSIS — Z85820 Personal history of malignant melanoma of skin: Secondary | ICD-10-CM | POA: Diagnosis not present

## 2021-03-27 DIAGNOSIS — L738 Other specified follicular disorders: Secondary | ICD-10-CM | POA: Diagnosis not present

## 2021-03-29 NOTE — Telephone Encounter (Signed)
In Nicholas Bird's absence, and given correspondence that Dr. Caryl Comes would like to discuss with Dr. Henrene Pastor directly, routing this message to Dr. Henrene Pastor to determine if any further action is required from our staff at this time. Will await his response

## 2021-03-29 NOTE — Telephone Encounter (Signed)
Ammie, Thank you for following up on this issue.  I have read the cardiology assessment recommending holding Eliquis 1 day prior to the procedures (they are aware of his GFR).  We will go with this recommendation. Please contact the patient and have him hold his Eliquis 1 day prior to the procedure (as well as the day of the procedure).  Please document this patient correspondence in the medical record.  Thank you. Dr. Henrene Pastor

## 2021-03-29 NOTE — Telephone Encounter (Signed)
Called pt and informed to hold Eliquis the day before procedure INCLUDING the day of the procedure. Pt also aware he will be told when to resume medication when he receives his discharge instructions. Pt expressed complete understanding of these instructions and appreciation for my call.

## 2021-04-10 ENCOUNTER — Other Ambulatory Visit: Payer: Self-pay

## 2021-04-10 ENCOUNTER — Encounter: Payer: Self-pay | Admitting: Internal Medicine

## 2021-04-10 ENCOUNTER — Ambulatory Visit (AMBULATORY_SURGERY_CENTER): Payer: Medicare HMO | Admitting: Internal Medicine

## 2021-04-10 VITALS — BP 113/68 | HR 55 | Temp 97.3°F | Resp 13 | Ht 71.0 in | Wt 193.8 lb

## 2021-04-10 DIAGNOSIS — K222 Esophageal obstruction: Secondary | ICD-10-CM | POA: Diagnosis not present

## 2021-04-10 DIAGNOSIS — K449 Diaphragmatic hernia without obstruction or gangrene: Secondary | ICD-10-CM | POA: Diagnosis not present

## 2021-04-10 DIAGNOSIS — K7460 Unspecified cirrhosis of liver: Secondary | ICD-10-CM | POA: Diagnosis not present

## 2021-04-10 DIAGNOSIS — R935 Abnormal findings on diagnostic imaging of other abdominal regions, including retroperitoneum: Secondary | ICD-10-CM

## 2021-04-10 DIAGNOSIS — Z86010 Personal history of colonic polyps: Secondary | ICD-10-CM | POA: Diagnosis not present

## 2021-04-10 DIAGNOSIS — K2100 Gastro-esophageal reflux disease with esophagitis, without bleeding: Secondary | ICD-10-CM

## 2021-04-10 DIAGNOSIS — D122 Benign neoplasm of ascending colon: Secondary | ICD-10-CM

## 2021-04-10 DIAGNOSIS — K5730 Diverticulosis of large intestine without perforation or abscess without bleeding: Secondary | ICD-10-CM

## 2021-04-10 MED ORDER — OMEPRAZOLE 20 MG PO CPDR: 20.0000 mg | DELAYED_RELEASE_CAPSULE | Freq: Every day | ORAL | 11 refills | Status: DC

## 2021-04-10 MED ORDER — SODIUM CHLORIDE 0.9 % IV SOLN
500.0000 mL | Freq: Once | INTRAVENOUS | Status: DC
Start: 2021-04-10 — End: 2021-04-10

## 2021-04-10 NOTE — Progress Notes (Signed)
Called to room to assist during endoscopic procedure.  Patient ID and intended procedure confirmed with present staff. Received instructions for my participation in the procedure from the performing physician.  

## 2021-04-10 NOTE — Op Note (Signed)
[place] Patient Name: Nicholas Bird Procedure Date: 04/10/2021 8:03 AM MRN: [id number] Endoscopist: Docia Chuck. Henrene Pastor , MD Age: 80 Referring MD:  Date of Birth: 1940-11-25 Gender: Male Account #: [id number] Procedure:                Upper GI endoscopy Indications:              Cirrhosis rule out esophageal varices Medicines:                Monitored Anesthesia Care Procedure:                Pre-Anesthesia Assessment:                           - Prior to the procedure, a History and Physical                            was performed, and patient medications and                            allergies were reviewed. The patient's tolerance of                            previous anesthesia was also reviewed. The risks                            and benefits of the procedure and the sedation                            options and risks were discussed with the patient.                            All questions were answered, and informed consent                            was obtained. Prior Anticoagulants: The patient has                            taken Eliquis (apixaban), last dose was 2 days                            prior to procedure. ASA Grade Assessment: III - A                            patient with severe systemic disease. After                            reviewing the risks and benefits, the patient was                            deemed in satisfactory condition to undergo the                            procedure.  After obtaining informed consent, the endoscope was                            passed under direct vision. Throughout the                            procedure, the patient's blood pressure, pulse, and                            oxygen saturations were monitored continuously. The                            GIF [id number] was introduced through the                            mouth, and advanced to the second part of duodenum.                             The upper GI endoscopy was accomplished without                            difficulty. The patient tolerated the procedure                            well. Scope In: Scope Out: Findings:                 The esophagus revealed a large caliber distal                            esophageal ring with mild esophagitis.                           The stomach revealed a small hiatal hernia and                            minimal hematin without focal lesion, in the                            proximal stomach. The stomach was otherwise normal.                           The examined duodenum was normal.                           The cardia and gastric fundus were normal on                            retroflexion. Complications:            No immediate complications. Estimated Blood Loss:     Estimated blood loss: none. Impression:               1. Esophageal stricture with mild reflux esophagitis  2. Otherwise unremarkable EGD. No varices. Recommendation:           - Patient has a contact number available for                            emergencies. The signs and symptoms of potential                            delayed complications were discussed with the                            patient. Return to normal activities tomorrow.                            Written discharge instructions were provided to the                            patient.                           - Resume previous diet.                           - Continue present medications.                           -Resume Eliquis today                           -Prescribe omeprazole 20 mg daily; #30; 11 refills.                            This will protect your esophagus against                            inflammation and progressive stricturing. As well,                            reduce the risk of upper GI bleeding in patients on                            chronic anticoagulation therapy                            -Return to the care of your primary provider. GI                            follow-up as needed Docia Chuck. Henrene Pastor, MD 04/10/2021 9:10:56 AM This report has been signed electronically.

## 2021-04-10 NOTE — Patient Instructions (Addendum)
Resume Eliquis today at prior dose  Handout on esophagitis given to you today  Omeprazole 20 mg daily sent to you pharmacy for pickup  Handouts on polyps,diverticulosis,& hemorrhoids given to you today  Await pathology results on polyps removed    YOU HAD AN ENDOSCOPIC PROCEDURE TODAY AT [facility]:   Refer to the procedure report that was given to you for any specific questions about what was found during the examination.  If the procedure report does not answer your questions, please call your gastroenterologist to clarify.  If you requested that your care partner not be given the details of your procedure findings, then the procedure report has been included in a sealed envelope for you to review at your convenience later.  YOU SHOULD EXPECT: Some feelings of bloating in the abdomen. Passage of more gas than usual.  Walking can help get rid of the air that was put into your GI tract during the procedure and reduce the bloating. If you had a lower endoscopy (such as a colonoscopy or flexible sigmoidoscopy) you may notice spotting of blood in your stool or on the toilet paper. If you underwent a bowel prep for your procedure, you may not have a normal bowel movement for a few days.  Please Note:  You might notice some irritation and congestion in your nose or some drainage.  This is from the oxygen used during your procedure.  There is no need for concern and it should clear up in a day or so.  SYMPTOMS TO REPORT IMMEDIATELY:  Following lower endoscopy (colonoscopy or flexible sigmoidoscopy):  Excessive amounts of blood in the stool  Significant tenderness or worsening of abdominal pains  Swelling of the abdomen that is new, acute  Fever of 100F or higher  Following upper endoscopy (EGD)  Vomiting of blood or coffee ground material  New chest pain or pain under the shoulder blades  Painful or persistently difficult swallowing  New shortness of breath  Fever of  100F or higher  Black, tarry-looking stools  For urgent or emergent issues, a gastroenterologist can be reached at any hour by calling [phone]. Do not use MyChart messaging for urgent concerns.    DIET:  We do recommend a small meal at first, but then you may proceed to your regular diet.  Drink plenty of fluids but you should avoid alcoholic beverages for 24 hours.  ACTIVITY:  You should plan to take it easy for the rest of today and you should NOT DRIVE or use heavy machinery until tomorrow (because of the sedation medicines used during the test).    FOLLOW UP: Our staff will call the number listed on your records 48-72 hours following your procedure to check on you and address any questions or concerns that you may have regarding the information given to you following your procedure. If we do not reach you, we will leave a message.  We will attempt to reach you two times.  During this call, we will ask if you have developed any symptoms of COVID 19. If you develop any symptoms (ie: fever, flu-like symptoms, shortness of breath, cough etc.) before then, please call [phone].  If you test positive for Covid 19 in the 2 weeks post procedure, please call and report this information to [place].    If any biopsies were taken you will be contacted by phone or by letter within the next 1-3 weeks.  Please call us at [phone] if you have  not heard about the biopsies in 3 weeks.    SIGNATURES/CONFIDENTIALITY: You and/or your care partner have signed paperwork which will be entered into your electronic medical record.  These signatures attest to the fact that that the information above on your After Visit Summary has been reviewed and is understood.  Full responsibility of the confidentiality of this discharge information lies with you and/or your care-partner.

## 2021-04-10 NOTE — Op Note (Signed)
[place] Patient Name: Nicholas Bird Procedure Date: 04/10/2021 8:04 AM MRN: [id number] Endoscopist: Docia Chuck. Henrene Pastor , MD Age: 80 Referring MD:  Date of Birth: 1940-09-16 Gender: Male Account #: [id number] Procedure:                Colonoscopy with cold snare polypectomy x 4 Indications:              High risk colon cancer surveillance: Personal                            history of non-advanced adenoma. Previous                            examination April 2016. Also sent by his oncologist                            from [facility] regarding abnormal PET scan                            imaging with increased activity in the region of                            the sigmoid colon Medicines:                Monitored Anesthesia Care Procedure:                Pre-Anesthesia Assessment:                           - Prior to the procedure, a History and Physical                            was performed, and patient medications and                            allergies were reviewed. The patient's tolerance of                            previous anesthesia was also reviewed. The risks                            and benefits of the procedure and the sedation                            options and risks were discussed with the patient.                            All questions were answered, and informed consent                            was obtained. Prior Anticoagulants: The patient has                            taken Eliquis (apixaban), last dose was 2 days  prior to procedure. ASA Grade Assessment: III - A                            patient with severe systemic disease. After                            reviewing the risks and benefits, the patient was                            deemed in satisfactory condition to undergo the                            procedure.                           After obtaining informed consent, the colonoscope                             was passed under direct vision. Throughout the                            procedure, the patient's blood pressure, pulse, and                            oxygen saturations were monitored continuously. The                            Olympus CF-HQ190L Colonoscope was introduced                            through the anus and advanced to the the cecum,                            identified by appendiceal orifice and ileocecal                            valve. The ileocecal valve, appendiceal orifice,                            and rectum were photographed. The quality of the                            bowel preparation was good. The colonoscopy was                            performed without difficulty. The patient tolerated                            the procedure well. The bowel preparation used was                            SUPREP via split dose instruction. Scope In: 8:19:18 AM Scope Out: 8:43:08 AM Scope Withdrawal Time: 0 hours 13 minutes 32 seconds  Total Procedure Duration: 0 hours 23  minutes 50 seconds  Findings:                 Four polyps were found in the ascending colon. The                            polyps were 2 to 5 mm in size. These polyps were                            removed with a cold snare. Resection and retrieval                            were complete.                           Multiple diverticula were found in the sigmoid                            colon. There was sigmoid stenosis secondary to                            diverticular disease.                           Internal hemorrhoids were found during                            retroflexion. The hemorrhoids were moderate.                           The exam was otherwise without abnormality on                            direct and retroflexion views. Complications:            No immediate complications. Estimated blood loss:                            None. Estimated Blood Loss:     Estimated  blood loss: none. Impression:               - Four 2 to 5 mm polyps in the ascending colon,                            removed with a cold snare. Resected and retrieved.                           - Diverticulosis in the sigmoid colon.                           - Internal hemorrhoids.                           - The examination was otherwise normal on direct                            and retroflexion views. Recommendation:           -  Repeat colonoscopy is not recommended for                            surveillance.                           - Resume Eliquis (apixaban) today at prior dose.                           - Patient has a contact number available for                            emergencies. The signs and symptoms of potential                            delayed complications were discussed with the                            patient. Return to normal activities tomorrow.                            Written discharge instructions were provided to the                            patient.                           - Resume previous diet.                           - Continue present medications.                           - Await pathology results. Docia Chuck. Henrene Pastor, MD 04/10/2021 9:06:30 AM This report has been signed electronically.

## 2021-04-10 NOTE — Progress Notes (Signed)
VS by CW  Pt's states no medical or surgical changes since previsit or office visit.  

## 2021-04-10 NOTE — Progress Notes (Signed)
Pt Drowsy. VSS. To PACU, report to RN. No anesthetic complications noted.  

## 2021-04-12 ENCOUNTER — Telehealth: Payer: Self-pay

## 2021-04-12 NOTE — Telephone Encounter (Signed)
  Follow up Call-  Call back number 04/10/2021  Post procedure Call Back phone  # [phone]  Permission to leave phone message Yes  Some recent data might be hidden     Patient questions:  Do you have a fever, pain , or abdominal swelling? No. Pain Score  0 *  Have you tolerated food without any problems? Yes.    Have you been able to return to your normal activities? Yes.    Do you have any questions about your discharge instructions: Diet   No. Medications  No. Follow up visit  No.  Do you have questions or concerns about your Care? No.  Actions: * If pain score is 4 or above: No action needed, pain <4.

## 2021-04-14 ENCOUNTER — Encounter: Payer: Self-pay | Admitting: Internal Medicine

## 2021-04-15 ENCOUNTER — Other Ambulatory Visit: Payer: Self-pay | Admitting: Internal Medicine

## 2021-04-19 ENCOUNTER — Other Ambulatory Visit: Payer: Self-pay | Admitting: Internal Medicine

## 2021-04-26 ENCOUNTER — Other Ambulatory Visit: Payer: Self-pay | Admitting: Internal Medicine

## 2021-04-30 ENCOUNTER — Other Ambulatory Visit: Payer: Self-pay | Admitting: Internal Medicine

## 2021-05-01 DIAGNOSIS — C434 Malignant melanoma of scalp and neck: Secondary | ICD-10-CM | POA: Diagnosis not present

## 2021-05-22 ENCOUNTER — Other Ambulatory Visit: Payer: Self-pay | Admitting: Family Medicine

## 2021-07-31 DIAGNOSIS — L718 Other rosacea: Secondary | ICD-10-CM | POA: Diagnosis not present

## 2021-07-31 DIAGNOSIS — C44719 Basal cell carcinoma of skin of left lower limb, including hip: Secondary | ICD-10-CM | POA: Diagnosis not present

## 2021-07-31 DIAGNOSIS — Z85828 Personal history of other malignant neoplasm of skin: Secondary | ICD-10-CM | POA: Diagnosis not present

## 2021-07-31 DIAGNOSIS — L812 Freckles: Secondary | ICD-10-CM | POA: Diagnosis not present

## 2021-07-31 DIAGNOSIS — D485 Neoplasm of uncertain behavior of skin: Secondary | ICD-10-CM | POA: Diagnosis not present

## 2021-07-31 DIAGNOSIS — Z85820 Personal history of malignant melanoma of skin: Secondary | ICD-10-CM | POA: Diagnosis not present

## 2021-07-31 DIAGNOSIS — L850 Acquired ichthyosis: Secondary | ICD-10-CM | POA: Diagnosis not present

## 2021-07-31 DIAGNOSIS — L570 Actinic keratosis: Secondary | ICD-10-CM | POA: Diagnosis not present

## 2021-08-02 ENCOUNTER — Telehealth: Payer: Self-pay

## 2021-08-02 DIAGNOSIS — C434 Malignant melanoma of scalp and neck: Secondary | ICD-10-CM | POA: Diagnosis not present

## 2021-08-02 NOTE — Telephone Encounter (Signed)
A copy of Mr. Deeg final pathology has been placed in Dr. Cordelia Pen box for review.

## 2021-08-18 ENCOUNTER — Telehealth: Payer: Self-pay | Admitting: Family Medicine

## 2021-08-18 ENCOUNTER — Other Ambulatory Visit: Payer: Self-pay | Admitting: Family Medicine

## 2021-08-18 NOTE — Telephone Encounter (Signed)
See note please

## 2021-08-18 NOTE — Telephone Encounter (Signed)
Patient called because pharmacy says they cannot fill prescription for allopurinol (ZYLOPRIM) 300 MG tablet, without authorization from [place]     Please send to  [facility] - [place], [place] - [address] Phone:  [phone]  Fax:  [phone]         Please advise

## 2021-08-24 DIAGNOSIS — R933 Abnormal findings on diagnostic imaging of other parts of digestive tract: Secondary | ICD-10-CM | POA: Diagnosis not present

## 2021-08-24 DIAGNOSIS — C434 Malignant melanoma of scalp and neck: Secondary | ICD-10-CM | POA: Diagnosis not present

## 2021-09-01 ENCOUNTER — Encounter: Payer: Self-pay | Admitting: Internal Medicine

## 2021-09-01 ENCOUNTER — Ambulatory Visit: Payer: Medicare HMO | Admitting: Internal Medicine

## 2021-09-01 ENCOUNTER — Other Ambulatory Visit: Payer: Self-pay

## 2021-09-01 VITALS — BP 124/80 | HR 66 | Ht 71.0 in | Wt 195.2 lb

## 2021-09-01 DIAGNOSIS — D649 Anemia, unspecified: Secondary | ICD-10-CM

## 2021-09-01 DIAGNOSIS — I4821 Permanent atrial fibrillation: Secondary | ICD-10-CM

## 2021-09-01 NOTE — Patient Instructions (Signed)
Medication Instructions:  Your physician recommends that you continue on your current medications as directed. Please refer to the Current Medication list given to you today.  *If you need a refill on your cardiac medications before your next appointment, please call your pharmacy*   Lab Work: CBC today If you have labs (blood work) drawn today and your tests are completely normal, you will receive your results only by: [place] (if you have MyChart) OR A paper copy in the mail If you have any lab test that is abnormal or we need to change your treatment, we will call you to review the results.   Testing/Procedures: None ordered.    Follow-Up: At [facility], you and your health needs are our priority.  As part of our continuing mission to provide you with exceptional heart care, we have created designated Provider Care Teams.  These Care Teams include your primary Cardiologist (physician) and Advanced Practice Providers (APPs -  Physician Assistants and Nurse Practitioners) who all work together to provide you with the care you need, when you need it.  We recommend signing up for the patient portal called "MyChart".  Sign up information is provided on this After Visit Summary.  MyChart is used to connect with patients for Virtual Visits (Telemedicine).  Patients are able to view lab/test results, encounter notes, upcoming appointments, etc.  Non-urgent messages can be sent to your provider as well.   To learn more about what you can do with MyChart, go to [email or web address].    Your next appointment:   6 month(s)  The format for your next appointment:   In Person  Provider:   Virl Axe, MD

## 2021-09-01 NOTE — Progress Notes (Signed)
Patient Care Team: Nicholas Barrack, MD as PCP - General (Family Medicine) Nicholas Sprang, MD as PCP - Electrophysiology (Cardiology) Nicholas Sprang, MD (Cardiology) Nicholas Gallo, MD as Attending Physician (Urology) Nicholas Matin, MD as Consulting Physician (Dermatology) Nicholas Apo, MD as Consulting Physician (Ophthalmology) Nicholas Shipper, MD as Consulting Physician (Gastroenterology) Nicholas Bird, [facility] as Pharmacist (Pharmacist)   HPI  Nicholas Bird is a 80 y.o. male seen in followup for atrial fibrillation which is permanent.  2013 ? TIA. There is apparently some lack of consensus as to whether this was a thromboembolic episode or not.   Ultimately decided to put him on oral anticoagulation therapy.   Coumadi>>Pradaxa. GI symptoms however intervened and he was changed to  Rivaroxaban >> apixoban   No  bleeding       Date Cr K Hgb  3/18 0.93  14.5  3/19 1.01  11.0  8/19   13.2  6/22 1.35 4.9 11.3    Lately his BP has been well-controlled. He denies any bleeding on Eliquis.  Today, the patient denies chest pain, shortness of breath, nocturnal dyspnea, orthopnea or peripheral edema.  There have been no palpitations, lightheadedness or syncope.   He has developed recurrent melanoma located on his head.   He constantly suffers from flare-ups of diverticulitis.   Records and Results Reviewed  Past Medical History:  Diagnosis Date   Alcohol dependence ([place])    Alcoholic cirrhosis of liver (HCC)    Anticoagulant long-term use    Atrial fibrillation, permanent ([place])    CARIOLOGIST-  DR Caryl Comes   Bladder diverticulum    BPH (benign prostatic hypertrophy)    ED (erectile dysfunction) of organic origin    Elevated LFTs    CHRONIC   Gall stones    Gout    per pt 12-09-2013  gout is stable   Hematuria    History of pancreatitis    10/2012--  resolved    History of TIA (transient ischemic attack)    04/2009--  no residual   Hyperlipemia    Hypertension     Liver disease    Melanoma ([place])    Personal history of colonic polyps 07/12/2008   TUBULAR ADENOMA   Rosacea     Past Surgical History:  Procedure Laterality Date   CARDIAC CATHETERIZATION  09-19-1998   false positive stress test---  normal cath and preserved lvf   CATARACT EXTRACTION W/ INTRAOCULAR LENS  IMPLANT, BILATERAL Bilateral 2008   CYSTOSCOPY WITH BIOPSY N/A 12/14/2013   Procedure: CYSTOSCOPY WITH BIOPSY;  Surgeon: Nicholas Gallo, MD;  Location: [facility];  Service: Urology;  Laterality: N/A;   INGUINAL HERNIA REPAIR Left 01-14-2001   LAPAROSCOPIC CHOLECYSTECTOMY  10-21-2008   AND LIVER BX'S   LAPAROSCOPIC INGUINAL HERNIA REPAIR Bilateral 05-22-2009    Current Meds  Medication Sig   allopurinol (ZYLOPRIM) 300 MG tablet TAKE 1 TABLET(300 MG) BY MOUTH DAILY.   amLODipine (NORVASC) 5 MG tablet TAKE 1 TABLET(5 MG) BY MOUTH DAILY   ampicillin (PRINCIPEN) 500 MG capsule Take 500 mg by mouth 2 (two) times daily as needed.   apixaban (ELIQUIS) 5 MG TABS tablet TAKE 1 TABLET(5 MG) BY MOUTH TWICE DAILY   candesartan-hydrochlorothiazide (ATACAND HCT) 32-12.5 MG tablet TAKE 1 TABLET BY MOUTH DAILY   ferrous sulfate 325 (65 FE) MG tablet Take 325 mg by mouth 2 (two) times daily with a meal.    finasteride (PROSCAR) 5 MG tablet Take 5 mg by  mouth.   furosemide (LASIX) 40 MG tablet Take 1 tablet (40 mg total) by mouth every other day. May take extra as needed.   metoprolol succinate (TOPROL-XL) 50 MG 24 hr tablet TAKE 1 TABLET BY MOUTH EVERY DAY WITH OR IMMEDIATELY FOLLOWING A MEAL    No Known Allergies    Review of Systems negative except from HPI and PMH  Physical Exam BP 124/80    Pulse 66    Ht 5\' 11"  (1.803 m)    Wt 195 lb 3.2 oz (88.5 kg)    SpO2 98%    BMI 27.22 kg/m  Well developed and well nourished in no acute distress HENT normal E scleral and icterus clear Neck Supple JVP flat; carotids brisk and full Clear to ausculation Regular rate and  rhythm, no murmurs gallops or rub Soft with active bowel sounds No clubbing cyanosis  Edema Alert and oriented, grossly normal motor and sensory function Skin Warm and Dry  ECG atrial fibrillatin  CrCl cannot be calculated (Patient's most recent lab result is older than the maximum 21 days allowed.).   Assessment and  Plan  Atrial fibrillation   Prior TIA   Bradycardia   Hypertension   Anemia   Recently identified with another melanoma and in the context of discussing watchman as an alternative for anticoagulation given recurrent GI bleeding he would like to delay until becomes clear what will happen about the melanoma  BP much better controlled continue metoprolol 50 amlodipine 5 and atacand hct 32/12.5     Continue Apixoban  5 bid and Fe supplements  (As above)     Current medicines are reviewed at length with the patient today .  The patient does not  have concerns regarding medicines.   I,Mathew Stumpf,acting as a scribe for Virl Axe, MD.,have documented all relevant documentation on the behalf of Virl Axe, MD,as directed by  Virl Axe, MD while in the presence of Virl Axe, MD.  I, Virl Axe, MD, have reviewed all documentation for this visit. The documentation on 09/01/21 for the exam, diagnosis, procedures, and orders are all accurate and complete.

## 2021-09-02 LAB — CBC
Hematocrit: 32.4 % — ABNORMAL LOW (ref 37.5–51.0)
Hemoglobin: 11.9 g/dL — ABNORMAL LOW (ref 13.0–17.7)
MCH: 37.4 pg — ABNORMAL HIGH (ref 26.6–33.0)
MCHC: 36.7 g/dL — ABNORMAL HIGH (ref 31.5–35.7)
MCV: 102 fL — ABNORMAL HIGH (ref 79–97)
Platelets: 199 10*3/uL (ref 150–450)
RBC: 3.18 x10E6/uL — ABNORMAL LOW (ref 4.14–5.80)
RDW: 13 % (ref 11.6–15.4)
WBC: 7.3 10*3/uL (ref 3.4–10.8)

## 2021-09-04 DIAGNOSIS — H40013 Open angle with borderline findings, low risk, bilateral: Secondary | ICD-10-CM | POA: Diagnosis not present

## 2021-09-06 DIAGNOSIS — C434 Malignant melanoma of scalp and neck: Secondary | ICD-10-CM | POA: Diagnosis not present

## 2021-09-13 ENCOUNTER — Telehealth: Payer: Self-pay

## 2021-09-13 DIAGNOSIS — D649 Anemia, unspecified: Secondary | ICD-10-CM

## 2021-09-13 NOTE — Telephone Encounter (Signed)
-----   Message from Deboraha Sprang, MD sent at 09/09/2021  6:56 PM EST ----- Please Inform Patient  Labs are normal x still anemic with high MCV  could be associated with either folate or B12 deficiency  Could we order the folate level  B12 was done last year at [facility]  please    Thanks

## 2021-09-13 NOTE — Telephone Encounter (Signed)
Attempted phone call to pt and left voicemail message to contact RN at [phone] to discuss lab results.

## 2021-09-14 NOTE — Telephone Encounter (Signed)
Patient is returning call.  °

## 2021-09-14 NOTE — Telephone Encounter (Signed)
Spoke with pt and advised per Dr Caryl Comes labs are normal with exception of anemia.  Dr Caryl Comes recommends B12 and Folate labs.  Appointment scheduled for 09/15/2021 for lab draw.  Pt verbalizes understanding and agrees with current plan.

## 2021-09-15 ENCOUNTER — Other Ambulatory Visit: Payer: Self-pay

## 2021-09-15 ENCOUNTER — Other Ambulatory Visit: Payer: Medicare HMO | Admitting: *Deleted

## 2021-09-15 DIAGNOSIS — D509 Iron deficiency anemia, unspecified: Secondary | ICD-10-CM | POA: Diagnosis not present

## 2021-09-15 DIAGNOSIS — D649 Anemia, unspecified: Secondary | ICD-10-CM

## 2021-09-16 LAB — SPECIMEN STATUS

## 2021-09-18 ENCOUNTER — Other Ambulatory Visit: Payer: Self-pay

## 2021-09-18 MED ORDER — APIXABAN 5 MG PO TABS: ORAL_TABLET | ORAL | 5 refills | Status: DC

## 2021-09-18 NOTE — Telephone Encounter (Signed)
Eliquis 5 mg refill request received. Patient is 81 years old, weight- 88.5 kg, Crea- 1.35 on 02/20/21 , Diagnosis- afib, and last seen by Dr. Caryl Comes on 09/01/21. Dose is appropriate based on dosing criteria. Will send in refill to requested pharmacy.

## 2021-09-19 LAB — B12 AND FOLATE PANEL
Folate: 4.9 ng/mL (ref >3.0)
Vitamin B-12: 411 pg/mL (ref 232–1245)

## 2021-09-19 LAB — SPECIMEN STATUS REPORT

## 2021-09-22 ENCOUNTER — Ambulatory Visit: Payer: Medicare HMO

## 2021-09-22 DIAGNOSIS — C7989 Secondary malignant neoplasm of other specified sites: Secondary | ICD-10-CM | POA: Diagnosis not present

## 2021-09-22 DIAGNOSIS — C4339 Malignant melanoma of other parts of face: Secondary | ICD-10-CM | POA: Diagnosis not present

## 2021-09-22 DIAGNOSIS — C439 Malignant melanoma of skin, unspecified: Secondary | ICD-10-CM | POA: Diagnosis not present

## 2021-09-27 ENCOUNTER — Emergency Department (HOSPITAL_COMMUNITY)
Admission: EM | Admit: 2021-09-27 | Discharge: 2021-09-28 | Disposition: A | Discharge status: Home / Self Care | Payer: Medicare HMO | Attending: Emergency Medicine | Admitting: Emergency Medicine

## 2021-09-27 ENCOUNTER — Other Ambulatory Visit: Payer: Self-pay

## 2021-09-27 DIAGNOSIS — R58 Hemorrhage, not elsewhere classified: Secondary | ICD-10-CM | POA: Diagnosis not present

## 2021-09-27 DIAGNOSIS — I4891 Unspecified atrial fibrillation: Secondary | ICD-10-CM | POA: Diagnosis not present

## 2021-09-27 DIAGNOSIS — Z7901 Long term (current) use of anticoagulants: Secondary | ICD-10-CM | POA: Diagnosis not present

## 2021-09-27 DIAGNOSIS — D689 Coagulation defect, unspecified: Secondary | ICD-10-CM | POA: Diagnosis not present

## 2021-09-27 DIAGNOSIS — Z85820 Personal history of malignant melanoma of skin: Secondary | ICD-10-CM | POA: Insufficient documentation

## 2021-09-27 DIAGNOSIS — Z79899 Other long term (current) drug therapy: Secondary | ICD-10-CM | POA: Diagnosis not present

## 2021-09-27 DIAGNOSIS — I10 Essential (primary) hypertension: Secondary | ICD-10-CM | POA: Insufficient documentation

## 2021-09-27 DIAGNOSIS — Z743 Need for continuous supervision: Secondary | ICD-10-CM | POA: Diagnosis not present

## 2021-09-27 MED ORDER — TRANEXAMIC ACID FOR EPISTAXIS
500.0000 mg | Freq: Once | TOPICAL | Status: AC
  Administered 2021-09-27: 23:00:00 500 mg via TOPICAL
  Filled 2021-09-27: qty 10

## 2021-09-27 NOTE — ED Provider Notes (Signed)
[place] DEPT Provider Note  CSN: [id number] Arrival date & time: 09/27/21 2214  Chief Complaint(s) Coagulation Disorder  HPI Nicholas Bird is a 81 y.o. male with a past medical history listed below including permanent atrial fibrillation on Eliquis who had a melanoma excised 6 days ago presents today for bleeding from the excision site.  Procedure was performed on Friday and patient went back on his Eliquis Saturday night.  He was doing well up until this evening when he began bleeding.  He denies any fall or trauma.  No other physical complaints  HPI  Past Medical History Past Medical History:  Diagnosis Date   Alcohol dependence ([place])    Alcoholic cirrhosis of liver ([place])    Anticoagulant long-term use    Atrial fibrillation, permanent ([place])    CARIOLOGIST-  DR Caryl Comes   Bladder diverticulum    BPH (benign prostatic hypertrophy)    ED (erectile dysfunction) of organic origin    Elevated LFTs    CHRONIC   Gall stones    Gout    per pt 12-09-2013  gout is stable   Hematuria    History of pancreatitis    10/2012--  resolved    History of TIA (transient ischemic attack)    04/2009--  no residual   Hyperlipemia    Hypertension    Liver disease    Melanoma ([place])    Personal history of colonic polyps 07/12/2008   TUBULAR ADENOMA   Rosacea    Patient Active Problem List   Diagnosis Date Noted   Anemia, iron deficiency 04/16/2018   Malignant melanoma ([place]) 01/01/2015   Hematuria 07/23/2013   Polyneuropathy 07/23/2013   Numbness 07/22/2013   Erectile dysfunction 11/20/2012   Encounter for long-term (current) use of other medications 07/09/2012   GERD 12/07/2008   BENIGN PROSTATIC HYPERTROPHY, HX OF 11/04/2008   FEMORAL HERNIA 10/07/2008   HEMORRHOIDS 10/06/2008   Personal history of colonic polyps 10/06/2008   ECZEMA 06/03/2008   Dyslipidemia 08/06/2007   Alcoholism ([place]) 08/06/2007   HTN (hypertension) 08/06/2007   Gout 05/14/2007    Atrial fibrillation ([place]) 05/14/2007   Home Medication(s) Prior to Admission medications   Medication Sig Start Date End Date Taking? Authorizing Provider  allopurinol (ZYLOPRIM) 300 MG tablet TAKE 1 TABLET(300 MG) BY MOUTH DAILY. 08/18/21   Vivi Barrack, MD  amLODipine (NORVASC) 5 MG tablet TAKE 1 TABLET(5 MG) BY MOUTH DAILY 04/17/21   Deboraha Sprang, MD  ampicillin (PRINCIPEN) 500 MG capsule Take 500 mg by mouth 2 (two) times daily as needed. 03/23/21   [provider]  apixaban (ELIQUIS) 5 MG TABS tablet TAKE 1 TABLET(5 MG) BY MOUTH TWICE DAILY 09/18/21   Deboraha Sprang, MD  candesartan-hydrochlorothiazide (ATACAND HCT) 32-12.5 MG tablet TAKE 1 TABLET BY MOUTH DAILY 05/02/21   Deboraha Sprang, MD  ferrous sulfate 325 (65 FE) MG tablet Take 325 mg by mouth 2 (two) times daily with a meal.     [provider]  finasteride (PROSCAR) 5 MG tablet Take 5 mg by mouth. 10/21/14   [provider]  furosemide (LASIX) 40 MG tablet Take 1 tablet (40 mg total) by mouth every other day. May take extra as needed. 04/20/21   Deboraha Sprang, MD  metoprolol succinate (TOPROL-XL) 50 MG 24 hr tablet TAKE 1 TABLET BY MOUTH EVERY DAY WITH OR IMMEDIATELY FOLLOWING A MEAL 04/26/21   Deboraha Sprang, MD  omeprazole (PRILOSEC) 20 MG capsule Take 1  capsule (20 mg total) by mouth daily. Patient not taking: Reported on 09/01/2021 04/10/21   Irene Shipper, MD                                                                                                                                    Allergies Patient has no known allergies.  Review of Systems Review of Systems As noted in HPI  Physical Exam Vital Signs  I have reviewed the triage vital signs BP 131/71    Pulse 84    Temp 97.9 F (36.6 C) (Oral)    Resp 16    Ht 5\' 11"  (1.803 m)    SpO2 97%    BMI 27.22 kg/m   Physical Exam Vitals reviewed.  Constitutional:      General: He is not in acute distress.    Appearance: He is  well-developed. He is not diaphoretic.  HENT:     Head: Normocephalic and atraumatic.      Right Ear: External ear normal.     Left Ear: External ear normal.     Nose: Nose normal.     Mouth/Throat:     Mouth: Mucous membranes are moist.  Eyes:     General: No scleral icterus.    Conjunctiva/sclera: Conjunctivae normal.  Neck:     Trachea: Phonation normal.  Cardiovascular:     Rate and Rhythm: Normal rate and regular rhythm.  Pulmonary:     Effort: Pulmonary effort is normal. No respiratory distress.     Breath sounds: No stridor.  Abdominal:     General: There is no distension.  Musculoskeletal:        General: Normal range of motion.     Cervical back: Normal range of motion.  Neurological:     Mental Status: He is alert and oriented to person, place, and time.  Psychiatric:        Behavior: Behavior normal.    ED Results and Treatments Labs (all labs ordered are listed, but only abnormal results are displayed) Labs Reviewed - No data to display                                                                                                                       EKG  EKG Interpretation  Date/Time:    Ventricular Rate:    PR Interval:    QRS Duration:   QT Interval:  QTC Calculation:   R Axis:     Text Interpretation:         Radiology No results found.  Pertinent labs & imaging results that were available during my care of the patient were reviewed by me and considered in my medical decision making (see MDM for details).  Medications Ordered in ED Medications  tranexamic acid (CYKLOKAPRON) 1000 MG/10ML topical solution 500 mg (500 mg Topical Given 09/27/21 2324)                                                                                                                                     Procedures Procedures  (including critical care time)  Medical Decision Making / ED Course        Bleeding from excision site Currently  controlled   Management: TXA soaked gauze applied.   Reassessment: Continues to be hemostatic    Final Clinical Impression(s) / ED Diagnoses Final diagnoses:  Bleeding   The patient appears reasonably screened and/or stabilized for discharge and I doubt any other medical condition or other [facility] requiring further screening, evaluation, or treatment in the ED at this time prior to discharge. Safe for discharge with strict return precautions.  Disposition: Discharge  Condition: Good  I have discussed the results, Dx and Tx plan with the patient/family who expressed understanding and agree(s) with the plan. Discharge instructions discussed at length. The patient/family was given strict return precautions who verbalized understanding of the instructions. No further questions at time of discharge.    ED Discharge Orders     None         Follow Up: Vivi Barrack, MD [address] [place] [address] [phone]  Call  as needed           This chart was dictated using voice recognition software.  Despite best efforts to proofread,  errors can occur which can change the documentation meaning.    Fatima Blank, MD 09/27/21 2358

## 2021-09-27 NOTE — ED Triage Notes (Signed)
Patient BIB EMS from home c/o uncontrolled bleeding. Per report patient had Melanoma removed last Friday 09/22/21. Per report bleeding started at 2115 and unable to control. Pt take blood thinner twice a day. Patient denies fall or injury. Pt a/xo4.  BP 160/64  HR 94 RR 20 O2sat 98% on RA

## 2021-09-29 ENCOUNTER — Ambulatory Visit: Payer: Medicare HMO

## 2021-09-30 ENCOUNTER — Encounter (HOSPITAL_COMMUNITY): Payer: Self-pay

## 2021-09-30 ENCOUNTER — Emergency Department (HOSPITAL_COMMUNITY)
Admission: EM | Admit: 2021-09-30 | Discharge: 2021-09-30 | Disposition: A | Discharge status: Home / Self Care | Payer: Medicare HMO | Attending: Emergency Medicine | Admitting: Emergency Medicine

## 2021-09-30 ENCOUNTER — Other Ambulatory Visit: Payer: Self-pay

## 2021-09-30 DIAGNOSIS — I10 Essential (primary) hypertension: Secondary | ICD-10-CM | POA: Diagnosis not present

## 2021-09-30 DIAGNOSIS — Z743 Need for continuous supervision: Secondary | ICD-10-CM | POA: Diagnosis not present

## 2021-09-30 DIAGNOSIS — R58 Hemorrhage, not elsewhere classified: Secondary | ICD-10-CM | POA: Diagnosis not present

## 2021-09-30 DIAGNOSIS — Z4801 Encounter for change or removal of surgical wound dressing: Secondary | ICD-10-CM | POA: Insufficient documentation

## 2021-09-30 DIAGNOSIS — Z5189 Encounter for other specified aftercare: Secondary | ICD-10-CM

## 2021-09-30 DIAGNOSIS — L7622 Postprocedural hemorrhage and hematoma of skin and subcutaneous tissue following other procedure: Secondary | ICD-10-CM | POA: Diagnosis not present

## 2021-09-30 NOTE — Discharge Instructions (Signed)
Return for any problem.  Keep dressing in place as advised until you can follow-up with your dermatologist at [facility] on Monday.

## 2021-09-30 NOTE — ED Triage Notes (Signed)
Per EMS pt laceration opened. No pain/injury. Has not taken eliquis today. Only 1 pill since wed, suppose to take 2 a day.  BP 172/88

## 2021-09-30 NOTE — ED Provider Notes (Signed)
[place] DEPT Provider Note   CSN: [id number] Arrival date & time: 09/30/21  1834     History  Chief Complaint  Patient presents with   Laceration    Nicholas Bird is a 81 y.o. male.  81 year old male with prior medical history as detailed below presents for evaluation.  Patient reports recent excision from his left temple by Dermatology at [facility].  Excision occurred on January 20.  Patient is on Eliquis.  Patient presented with similar complaint on January 25.  Patient required application of TXA and dressing follow-up bleeding.  Patient reports that he took the dressing off 2 days ago.  Tonight he had recurrent bleeding from the excision site.  EMS applied a pressure dressing around his scalp covering the left temple.  Bleeding stopped prior to arrival in the ED.  Patient continues to take Eliquis for history of A. fib.  He denies other complaint.  The history is provided by the patient and medical records.  Illness Location:  Bleeding from excision site on the left temple Severity:  Mild Onset quality:  Sudden Duration:  1 hour Timing:  Constant Progression:  Resolved Chronicity:  Recurrent     Home Medications Prior to Admission medications   Medication Sig Start Date End Date Taking? Authorizing Provider  allopurinol (ZYLOPRIM) 300 MG tablet TAKE 1 TABLET(300 MG) BY MOUTH DAILY. 08/18/21   Vivi Barrack, MD  amLODipine (NORVASC) 5 MG tablet TAKE 1 TABLET(5 MG) BY MOUTH DAILY 04/17/21   Deboraha Sprang, MD  ampicillin (PRINCIPEN) 500 MG capsule Take 500 mg by mouth 2 (two) times daily as needed. 03/23/21   [provider]  apixaban (ELIQUIS) 5 MG TABS tablet TAKE 1 TABLET(5 MG) BY MOUTH TWICE DAILY 09/18/21   Deboraha Sprang, MD  candesartan-hydrochlorothiazide (ATACAND HCT) 32-12.5 MG tablet TAKE 1 TABLET BY MOUTH DAILY 05/02/21   Deboraha Sprang, MD  ferrous sulfate 325 (65 FE) MG tablet Take 325 mg by mouth 2 (two) times daily  with a meal.     [provider]  finasteride (PROSCAR) 5 MG tablet Take 5 mg by mouth. 10/21/14   [provider]  furosemide (LASIX) 40 MG tablet Take 1 tablet (40 mg total) by mouth every other day. May take extra as needed. 04/20/21   Deboraha Sprang, MD  metoprolol succinate (TOPROL-XL) 50 MG 24 hr tablet TAKE 1 TABLET BY MOUTH EVERY DAY WITH OR IMMEDIATELY FOLLOWING A MEAL 04/26/21   Deboraha Sprang, MD  omeprazole (PRILOSEC) 20 MG capsule Take 1 capsule (20 mg total) by mouth daily. Patient not taking: Reported on 09/01/2021 04/10/21   Irene Shipper, MD      Allergies    Patient has no known allergies.    Review of Systems   Review of Systems  All other systems reviewed and are negative.  Physical Exam Updated Vital Signs BP (!) 147/80    Pulse 73    Temp 97.6 F (36.4 C) (Oral)    Resp 16    SpO2 99%  Physical Exam Vitals and nursing note reviewed.  Constitutional:      General: He is not in acute distress.    Appearance: Normal appearance. He is well-developed.  HENT:     Head: Normocephalic and atraumatic.  Eyes:     Conjunctiva/sclera: Conjunctivae normal.     Pupils: Pupils are equal, round, and reactive to light.  Cardiovascular:     Rate and Rhythm: Normal rate  and regular rhythm.     Heart sounds: Normal heart sounds.  Pulmonary:     Effort: Pulmonary effort is normal. No respiratory distress.     Breath sounds: Normal breath sounds.  Abdominal:     General: There is no distension.     Palpations: Abdomen is soft.     Tenderness: There is no abdominal tenderness.  Musculoskeletal:        General: No deformity. Normal range of motion.     Cervical back: Normal range of motion and neck supple.  Skin:    General: Skin is warm and dry.     Comments: Recent skin excision site on the left temple is without active bleeding.  There is small hematoma at the inferior margins of the wound.  There is no active bleeding.  Wound itself is without evidence of  dehiscence.  Neurological:     General: No focal deficit present.     Mental Status: He is alert and oriented to person, place, and time.    ED Results / Procedures / Treatments   Labs (all labs ordered are listed, but only abnormal results are displayed) Labs Reviewed - No data to display  EKG None  Radiology No results found.  Procedures Procedures    Medications Ordered in ED Medications - No data to display  ED Course/ Medical Decision Making/ A&P                           Medical Decision Making   Medical Screen Complete  This patient presented to the ED with complaint of bleeding from excision site.  This complaint involves an extensive number of treatment options. The initial differential diagnosis includes, but is not limited to, wound dehiscence, wound infection, etc.   This presentation is: Acute, Previously Undiagnosed, Uncertain Prognosis, Complicated, Systemic Symptoms, and Threat to Life/Bodily Function Patient presented with complaint of bleeding from recent excision on his left temple.  Patient's bleeding controlled by EMS application of pressure dressing.  Evaluation of the wound reveals hemostasis.  Patient's head was wrapped again with a pressure dressing.  Patient is advised to keep this dressing in place until he can follow-up with his dermatologist at [facility].  Importance of close follow-up is advised.  Strict return precautions given and understood.   Co morbidities that complicated the patient's evaluation  On Eliquis with hx of AFib   Additional history obtained:  Additional history obtained from EMS External records from outside sources obtained and reviewed including prior ED visits and prior Inpatient records.         Problem List / ED Course:  Bleeding from recent excision site   Reevaluation:  After the interventions noted above, I reevaluated the patient and found that they have: resolved     Disposition:  After  consideration of the diagnostic results and the patients response to treatment, I feel that the patent would benefit from close outpatient follow up.            Final Clinical Impression(s) / ED Diagnoses Final diagnoses:  Visit for wound check  Bleeding    Rx / DC Orders ED Discharge Orders     None         Valarie Merino, MD 09/30/21 2056

## 2021-10-02 DIAGNOSIS — Z09 Encounter for follow-up examination after completed treatment for conditions other than malignant neoplasm: Secondary | ICD-10-CM | POA: Diagnosis not present

## 2021-10-02 DIAGNOSIS — C434 Malignant melanoma of scalp and neck: Secondary | ICD-10-CM | POA: Diagnosis not present

## 2021-10-11 DIAGNOSIS — C434 Malignant melanoma of scalp and neck: Secondary | ICD-10-CM | POA: Diagnosis not present

## 2021-10-11 DIAGNOSIS — C4339 Malignant melanoma of other parts of face: Secondary | ICD-10-CM | POA: Diagnosis not present

## 2021-10-11 DIAGNOSIS — I728 Aneurysm of other specified arteries: Secondary | ICD-10-CM | POA: Diagnosis not present

## 2021-10-16 DIAGNOSIS — C7989 Secondary malignant neoplasm of other specified sites: Secondary | ICD-10-CM | POA: Diagnosis not present

## 2021-10-16 DIAGNOSIS — Z7901 Long term (current) use of anticoagulants: Secondary | ICD-10-CM | POA: Diagnosis not present

## 2021-10-16 DIAGNOSIS — Z801 Family history of malignant neoplasm of trachea, bronchus and lung: Secondary | ICD-10-CM | POA: Diagnosis not present

## 2021-10-16 DIAGNOSIS — I4891 Unspecified atrial fibrillation: Secondary | ICD-10-CM | POA: Diagnosis not present

## 2021-10-16 DIAGNOSIS — C439 Malignant melanoma of skin, unspecified: Secondary | ICD-10-CM | POA: Diagnosis not present

## 2021-10-16 DIAGNOSIS — Z85820 Personal history of malignant melanoma of skin: Secondary | ICD-10-CM | POA: Diagnosis not present

## 2021-10-16 DIAGNOSIS — C4339 Malignant melanoma of other parts of face: Secondary | ICD-10-CM | POA: Diagnosis not present

## 2021-10-16 DIAGNOSIS — Z808 Family history of malignant neoplasm of other organs or systems: Secondary | ICD-10-CM | POA: Diagnosis not present

## 2021-10-16 DIAGNOSIS — L905 Scar conditions and fibrosis of skin: Secondary | ICD-10-CM | POA: Diagnosis not present

## 2021-10-16 DIAGNOSIS — E785 Hyperlipidemia, unspecified: Secondary | ICD-10-CM | POA: Diagnosis not present

## 2021-10-25 DIAGNOSIS — C434 Malignant melanoma of scalp and neck: Secondary | ICD-10-CM | POA: Diagnosis not present

## 2021-10-25 DIAGNOSIS — Z09 Encounter for follow-up examination after completed treatment for conditions other than malignant neoplasm: Secondary | ICD-10-CM | POA: Diagnosis not present

## 2021-11-03 ENCOUNTER — Other Ambulatory Visit: Payer: Self-pay

## 2021-11-03 MED ORDER — METOPROLOL SUCCINATE ER 50 MG PO TB24: ORAL_TABLET | ORAL | 2 refills | Status: DC

## 2021-11-08 DIAGNOSIS — C434 Malignant melanoma of scalp and neck: Secondary | ICD-10-CM | POA: Diagnosis not present

## 2021-11-08 DIAGNOSIS — Z09 Encounter for follow-up examination after completed treatment for conditions other than malignant neoplasm: Secondary | ICD-10-CM | POA: Diagnosis not present

## 2021-11-16 ENCOUNTER — Other Ambulatory Visit: Payer: Self-pay | Admitting: Family Medicine

## 2021-11-30 ENCOUNTER — Ambulatory Visit (INDEPENDENT_AMBULATORY_CARE_PROVIDER_SITE_OTHER): Payer: Medicare HMO | Admitting: Family Medicine

## 2021-11-30 ENCOUNTER — Encounter: Payer: Self-pay | Admitting: Family Medicine

## 2021-11-30 VITALS — BP 122/69 | HR 80 | Temp 97.0°F | Ht 71.0 in | Wt 179.6 lb

## 2021-11-30 DIAGNOSIS — C439 Malignant melanoma of skin, unspecified: Secondary | ICD-10-CM | POA: Diagnosis not present

## 2021-11-30 DIAGNOSIS — K7460 Unspecified cirrhosis of liver: Secondary | ICD-10-CM | POA: Diagnosis not present

## 2021-11-30 DIAGNOSIS — I10 Essential (primary) hypertension: Secondary | ICD-10-CM | POA: Diagnosis not present

## 2021-11-30 DIAGNOSIS — Z0001 Encounter for general adult medical examination with abnormal findings: Secondary | ICD-10-CM | POA: Diagnosis not present

## 2021-11-30 DIAGNOSIS — M1A9XX0 Chronic gout, unspecified, without tophus (tophi): Secondary | ICD-10-CM | POA: Diagnosis not present

## 2021-11-30 DIAGNOSIS — Z85828 Personal history of other malignant neoplasm of skin: Secondary | ICD-10-CM | POA: Diagnosis not present

## 2021-11-30 DIAGNOSIS — L298 Other pruritus: Secondary | ICD-10-CM | POA: Diagnosis not present

## 2021-11-30 DIAGNOSIS — E785 Hyperlipidemia, unspecified: Secondary | ICD-10-CM

## 2021-11-30 DIAGNOSIS — L853 Xerosis cutis: Secondary | ICD-10-CM | POA: Diagnosis not present

## 2021-11-30 DIAGNOSIS — L570 Actinic keratosis: Secondary | ICD-10-CM | POA: Diagnosis not present

## 2021-11-30 DIAGNOSIS — I4821 Permanent atrial fibrillation: Secondary | ICD-10-CM | POA: Diagnosis not present

## 2021-11-30 DIAGNOSIS — D225 Melanocytic nevi of trunk: Secondary | ICD-10-CM | POA: Diagnosis not present

## 2021-11-30 DIAGNOSIS — Z85820 Personal history of malignant melanoma of skin: Secondary | ICD-10-CM | POA: Diagnosis not present

## 2021-11-30 MED ORDER — ALLOPURINOL 300 MG PO TABS
ORAL_TABLET | ORAL | 3 refills | Status: DC
Start: 2021-11-30 — End: 2023-03-25

## 2021-11-30 NOTE — Progress Notes (Signed)
? ?Chief Complaint:  ?Nicholas Bird is a 81 y.o. male who presents today for his annual comprehensive physical exam.   ? ?Assessment/Plan:  ?Chronic Problems Addressed Today: ?Atrial fibrillation ([place]) ?Stable.  Rate controlled with metoprolol 50 mg daily.  Anticoagulated on Eliquis 5 mg daily. ? ?HTN (hypertension) ?At goal today on regimen per cardiology.  Continue candesartan-HCTZ 30-12.5 once daily and metoprolol succinate 50 mg daily. ? ?Gout ?No recent flares.  Continue allopurinol 300 mg daily. ? ?Dyslipidemia ?Continue management per cardiology. ? ?Malignant melanoma ?Continue management per dermatology. ? ?Hepatic cirrhosis ([place]) ?Continue management per GI. ? ?Preventative Healthcare: ?Up-to-date on colon cancer screening and vaccines.  We will complete forms for independent living facility today. ? ?Patient Counseling(The following topics were reviewed and/or handout was given): ? -Nutrition: Stressed importance of moderation in sodium/caffeine intake, saturated fat and cholesterol, caloric balance, sufficient intake of fresh fruits, vegetables, and fiber. ? -Stressed the importance of regular exercise.  ? -Substance Abuse: Discussed cessation/primary prevention of tobacco, alcohol, or other drug use; driving or other dangerous activities under the influence; availability of treatment for abuse.  ? -Injury prevention: Discussed safety belts, safety helmets, smoke detector, smoking near bedding or upholstery.  ? -Sexuality: Discussed sexually transmitted diseases, partner selection, use of condoms, avoidance of unintended pregnancy and contraceptive alternatives.  ? -Dental health: Discussed importance of regular tooth brushing, flossing, and dental visits. ? -Health maintenance and immunizations reviewed. Please refer to Health maintenance section. ? ?Return to care in 1 year for next preventative visit.  ? ?  ?Subjective:  ?HPI: ? ?He has no acute complaints today.  ? ?Patient here for [facility] form  completion. He is requesting this to be completed today. ? ?Since our last visit, he had melanoma removed. Located on left temple. He notes his surgery was on 09/22/2021. Had bleeding from excision site.  He saw his surgical oncologist yesterday and his incision seems to be healing.  ? ?Lifestyle ?Diet: Balanced.  ?Exercise: Likes to walk. ? ? ?  11/30/2021  ?  1:13 PM  ?Depression screen PHQ 2/9  ?Decreased Interest 0  ?Down, Depressed, Hopeless 0  ?PHQ - 2 Score 0  ? ? ?Health Maintenance Due  ?Topic Date Due  ? COVID-19 Vaccine (5 - Booster for Pfizer series) 07/13/2021  ?  ? ?ROS: Per HPI, otherwise a complete review of systems was negative.  ? ?PMH: ? ?The following were reviewed and entered/updated in epic: ?Past Medical History:  ?Diagnosis Date  ? Anticoagulant long-term use   ? Atrial fibrillation, permanent ([place])   ? [place]  ? Bladder diverticulum   ? BPH (benign prostatic hypertrophy)   ? ED (erectile dysfunction) of organic origin   ? Elevated LFTs   ? CHRONIC  ? Gall stones   ? Gout   ? per pt 12-09-2013  gout is stable  ? Hematuria   ? History of pancreatitis   ? 10/2012--  resolved   ? History of TIA (transient ischemic attack)   ? 04/2009--  no residual  ? Hyperlipemia   ? Hypertension   ? Liver disease   ? Melanoma ([place])   ? Personal history of colonic polyps 07/12/2008  ? TUBULAR ADENOMA  ? Rosacea   ? ?Patient Active Problem List  ? Diagnosis Date Noted  ? Hepatic cirrhosis ([place]) 11/30/2021  ? Anemia, iron deficiency 04/16/2018  ? Malignant melanoma ([place]) 01/01/2015  ? Hematuria 07/23/2013  ? Polyneuropathy 07/23/2013  ? Numbness 07/22/2013  ?  Erectile dysfunction 11/20/2012  ? Encounter for long-term (current) use of other medications 07/09/2012  ? GERD 12/07/2008  ? BENIGN PROSTATIC HYPERTROPHY, HX OF 11/04/2008  ? FEMORAL HERNIA 10/07/2008  ? HEMORRHOIDS 10/06/2008  ? Personal history of colonic polyps 10/06/2008  ? ECZEMA 06/03/2008  ? Dyslipidemia 08/06/2007  ? HTN (hypertension)  08/06/2007  ? Gout 05/14/2007  ? Atrial fibrillation ([place]) 05/14/2007  ? ?Past Surgical History:  ?Procedure Laterality Date  ? CARDIAC CATHETERIZATION  09-19-1998  ? false positive stress test---  normal cath and preserved lvf  ? CATARACT EXTRACTION W/ INTRAOCULAR LENS  IMPLANT, BILATERAL Bilateral 2008  ? CYSTOSCOPY WITH BIOPSY N/A 12/14/2013  ? Procedure: CYSTOSCOPY WITH BIOPSY;  Surgeon: Franchot Gallo, MD;  [facility];  Service: Urology;  Laterality: N/A;  ? INGUINAL HERNIA REPAIR Left 01-14-2001  ? LAPAROSCOPIC CHOLECYSTECTOMY  10-21-2008  ? AND LIVER BX'S  ? LAPAROSCOPIC INGUINAL HERNIA REPAIR Bilateral 05-22-2009  ? ? ?Family History  ?Problem Relation Age of Onset  ? Lung cancer Mother   ? Skin cancer Sister   ? Colon cancer Neg Hx   ? Esophageal cancer Neg Hx   ? Pancreatic cancer Neg Hx   ? Stomach cancer Neg Hx   ? Rectal cancer Neg Hx   ? ? ?Medications- reviewed and updated ?Current Outpatient Medications  ?Medication Sig Dispense Refill  ? amLODipine (NORVASC) 5 MG tablet TAKE 1 TABLET(5 MG) BY MOUTH DAILY 90 tablet 2  ? ampicillin (PRINCIPEN) 500 MG capsule Take 500 mg by mouth 2 (two) times daily as needed.    ? apixaban (ELIQUIS) 5 MG TABS tablet TAKE 1 TABLET(5 MG) BY MOUTH TWICE DAILY 60 tablet 5  ? candesartan-hydrochlorothiazide (ATACAND HCT) 32-12.5 MG tablet TAKE 1 TABLET BY MOUTH DAILY 90 tablet 2  ? ferrous sulfate 325 (65 FE) MG tablet Take 325 mg by mouth 2 (two) times daily with a meal.     ? finasteride (PROSCAR) 5 MG tablet Take 5 mg by mouth.    ? furosemide (LASIX) 40 MG tablet Take 1 tablet (40 mg total) by mouth every other day. May take extra as needed. 60 tablet 2  ? metoprolol succinate (TOPROL-XL) 50 MG 24 hr tablet Take with or immediately following a meal. 90 tablet 2  ? allopurinol (ZYLOPRIM) 300 MG tablet TAKE 1 TABLET(300 MG) BY MOUTH DAILY 90 tablet 3  ? omeprazole (PRILOSEC) 20 MG capsule Take 1 capsule (20 mg total) by mouth daily. (Patient  not taking: Reported on 11/30/2021) 30 capsule 11  ? ?No current facility-administered medications for this visit.  ? ? ?Allergies-reviewed and updated ?No Known Allergies ? ?Social History  ? ?Socioeconomic History  ? Marital status: Married  ?  Spouse name: Not on file  ? Number of children: 2  ? Years of education: Not on file  ? Highest education level: Not on file  ?Occupational History  ? Occupation: retired  ?  Employer: RETIRED  ?Tobacco Use  ? Smoking status: Never  ? Smokeless tobacco: Never  ?Vaping Use  ? Vaping Use: Never used  ?Substance and Sexual Activity  ? Alcohol use: Yes  ?  Alcohol/week: 14.0 standard drinks  ?  Types: 14 Shots of liquor per week  ?  Comment: 3 oz. per day  ? Drug use: No  ? Sexual activity: Yes  ?  Partners: Female  ?Other Topics Concern  ? Not on file  ?Social History Narrative  ? Not on file  ? ?Social  Determinants of Health  ? ?Financial Resource Strain: Not on file  ?Food Insecurity: Not on file  ?Transportation Needs: Not on file  ?Physical Activity: Not on file  ?Stress: Not on file  ?Social Connections: Not on file  ? ?   ?  ?Objective:  ?Physical Exam: ?BP 122/69 (BP Location: Left Arm)   Pulse 80   Temp (!) 97 ?F (36.1 ?C) (Temporal)   Ht '5\' 11"'$  (1.803 m)   Wt 179 lb 9.6 oz (81.5 kg)   SpO2 99%   BMI 25.05 kg/m?   ?Body mass index is 25.05 kg/m?. ?Wt Readings from Last 3 Encounters:  ?11/30/21 179 lb 9.6 oz (81.5 kg)  ?09/01/21 195 lb 3.2 oz (88.5 kg)  ?04/10/21 193 lb 12.8 oz (87.9 kg)  ? ?Gen: NAD, resting comfortably ?HEENT: TMs normal bilaterally. OP clear. No thyromegaly noted.  ?CV: RRR with no murmurs appreciated ?Pulm: NWOB, CTAB with no crackles, wheezes, or rhonchi ?GI: Normal bowel sounds present. Soft, Nontender, Nondistended. ?MSK: no edema, cyanosis, or clubbing noted ?Skin: warm, dry ?Neuro: CN2-12 grossly intact. Strength 5/5 in upper and lower extremities. Reflexes symmetric and intact bilaterally.  ?Psych: Normal affect and thought content ?    ? ? ?I,Savera Zaman,acting as a Education administrator for Dimas Chyle, MD.,have documented all relevant documentation on the behalf of Dimas Chyle, MD,as directed by  Dimas Chyle, MD while in the presence of Dimas Chyle,

## 2021-11-30 NOTE — Assessment & Plan Note (Signed)
At goal today on regimen per cardiology.  Continue candesartan-HCTZ 30-12.5 once daily and metoprolol succinate 50 mg daily. ?

## 2021-11-30 NOTE — Assessment & Plan Note (Signed)
Continue management per GI.  

## 2021-11-30 NOTE — Assessment & Plan Note (Signed)
No recent flares.  Continue allopurinol 300 mg daily. 

## 2021-11-30 NOTE — Patient Instructions (Addendum)
It was very nice to see you today! ? ?I will refill your allopurinol.  We will complete your form for [facility]. ? ?Please come back in year for your next annual checkup.  Come back sooner if needed. ? ?Take care, ?Dr [name] ? ?PLEASE NOTE: ? ?If you had any lab tests please let us know if you have not heard back within a few days. You may see your results on mychart before we have a chance to review them but we will give you a call once they are reviewed by [place]. If we ordered any referrals today, please let us know if you have not heard from their office within the next week.  ? ?Please try these tips to maintain a healthy lifestyle: ? ?Eat at least 3 REAL meals and 1-2 snacks per day.  Aim for no more than 5 hours between eating.  If you eat breakfast, please do so within one hour of getting up.  ? ?Each meal should contain half fruits/vegetables, one quarter protein, and one quarter carbs (no bigger than a computer mouse) ? ?Cut down on sweet beverages. This includes juice, soda, and sweet tea.  ? ?Drink at least 1 glass of water with each meal and aim for at least 8 glasses per day ? ?Exercise at least 150 minutes every week.   ? ?Preventive Care 62 Years and Older, Male ?Preventive care refers to lifestyle choices and visits with your health care provider that can promote health and wellness. Preventive care visits are also called wellness exams. ?What can I expect for my preventive care visit? ?Counseling ?During your preventive care visit, your health care provider may ask about your: ?Medical history, including: ?Past medical problems. ?Family medical history. ?History of falls. ?Current health, including: ?Emotional well-being. ?Home life and relationship well-being. ?Sexual activity. ?Memory and ability to understand (cognition). ?Lifestyle, including: ?Alcohol, nicotine or tobacco, and drug use. ?Access to firearms. ?Diet, exercise, and sleep habits. ?Work and work Statistician. ?Sunscreen use. ?Safety  issues such as seatbelt and bike helmet use. ?Physical exam ?Your health care provider will check your: ?Height and weight. These may be used to calculate your BMI (body mass index). BMI is a measurement that tells if you are at a healthy weight. ?Waist circumference. This measures the distance around your waistline. This measurement also tells if you are at a healthy weight and may help predict your risk of certain diseases, such as type 2 diabetes and high blood pressure. ?Heart rate and blood pressure. ?Body temperature. ?Skin for abnormal spots. ?What immunizations do I need? ?Vaccines are usually given at various ages, according to a schedule. Your health care provider will recommend vaccines for you based on your age, medical history, and lifestyle or other factors, such as travel or where you work. ?What tests do I need? ?Screening ?Your health care provider may recommend screening tests for certain conditions. This may include: ?Lipid and cholesterol levels. ?Diabetes screening. This is done by checking your blood sugar (glucose) after you have not eaten for a while (fasting). ?Hepatitis C test. ?Hepatitis B test. ?HIV (human immunodeficiency virus) test. ?STI (sexually transmitted infection) testing, if you are at risk. ?Lung cancer screening. ?Colorectal cancer screening. ?Prostate cancer screening. ?Abdominal aortic aneurysm (AAA) screening. You may need this if you are a current or former smoker. ?Talk with your health care provider about your test results, treatment options, and if necessary, the need for more tests. ?Follow these instructions at home: ?Eating and drinking ? ?Eat  a diet that includes fresh fruits and vegetables, whole grains, lean protein, and low-fat dairy products. Limit your intake of foods with high amounts of sugar, saturated fats, and salt. ?Take vitamin and mineral supplements as recommended by your health care provider. ?Do not drink alcohol if your health care provider tells  you not to drink. ?If you drink alcohol: ?Limit how much you have to 0-2 drinks a day. ?Know how much alcohol is in your drink. In the [place] one drink equals one 12 oz bottle of beer (355 mL), one 5 oz glass of wine (148 mL), or one 1? oz glass of hard liquor (44 mL). ?Lifestyle ?Brush your teeth every morning and night with fluoride toothpaste. Floss one time each day. ?Exercise for at least 30 minutes 5 or more days each week. ?Do not use any products that contain nicotine or tobacco. These products include cigarettes, chewing tobacco, and vaping devices, such as e-cigarettes. If you need help quitting, ask your health care provider. ?Do not use drugs. ?If you are sexually active, practice safe sex. Use a condom or other form of protection to prevent STIs. ?Take aspirin only as told by your health care provider. Make sure that you understand how much to take and what form to take. Work with your health care provider to find out whether it is safe and beneficial for you to take aspirin daily. ?Ask your health care provider if you need to take a cholesterol-lowering medicine (statin). ?Find healthy ways to manage stress, such as: ?Meditation, yoga, or listening to music. ?Journaling. ?Talking to a trusted person. ?Spending time with friends and family. ?Safety ?Always wear your seat belt while driving or riding in a vehicle. ?Do not drive: ?If you have been drinking alcohol. Do not ride with someone who has been drinking. ?When you are tired or distracted. ?While texting. ?If you have been using any mind-altering substances or drugs. ?Wear a helmet and other protective equipment during sports activities. ?If you have firearms in your house, make sure you follow all gun safety procedures. ?Minimize exposure to UV radiation to reduce your risk of skin cancer. ?What's next? ?Visit your health care provider once a year for an annual wellness visit. ?Ask your health care provider how often you should have your eyes and  teeth checked. ?Stay up to date on all vaccines. ?This information is not intended to replace advice given to you by your health care provider. Make sure you discuss any questions you have with your health care provider. ?Document Revised: 02/15/2021 Document Reviewed: 02/15/2021 ?Elsevier Patient Education ? [place]. ? ?

## 2021-11-30 NOTE — Assessment & Plan Note (Signed)
Stable.  Rate controlled with metoprolol 50 mg daily.  Anticoagulated on Eliquis 5 mg daily. ?

## 2021-11-30 NOTE — Assessment & Plan Note (Signed)
Continue management per cardiology. 

## 2021-11-30 NOTE — Assessment & Plan Note (Signed)
Continue management per dermatology.  

## 2021-12-18 ENCOUNTER — Telehealth: Payer: Self-pay | Admitting: Family Medicine

## 2021-12-18 NOTE — Telephone Encounter (Signed)
Pt was advised to do a telehealth visit or UC ? ?Patient ?Name: ?Nicholas Bird ?RR ?Gender: Male ?DOB: Sep 09, 1940 ?Age: 81 Y 1 M 27 D ?Return ?Phone ?Number: ?[phone] ?(Primary), ?[id number] ?(Secondary) ?Address: ?City/ ?State/ ?Zip: ?[place] ? 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 If ?symptomatic, describe symptoms. ?---Caller states he tested positive for covid last ?night, he is having sneezing, runny nose, generalized ?weakness, congestion and headache. States he is ?coughing as well. ?Does the patient have any new or worsening ?symptoms? ---Yes ?Will a triage be completed? ---Yes ?Related visit to physician within the last 2 weeks? ---N/A ?Does the PT have any chronic conditions? (i.e. ?diabetes, asthma, this includes High risk factors for ?pregnancy, etc.) ?---Yes ?List chronic conditions. ---HTN, Afib ?Is this a behavioral health or substance abuse call? ---No ?Guidelines ?Guideline Title Affirmed Question Affirmed Notes Nurse Date/Time (Eastern ?Time) ?COVID-19 - ?Diagnosed or ?Suspected ?[1] HIGH RISK ?for severe COVID ?complications (e.g., ?weak immune ?system, age > 56 ?years, obesity with ?BMI 30 or higher, ?Linward Headland, [place], [facility] 12/16/2021 8:48:59 ?AM ?Guidelines ?Guideline Title Affirmed Question Affirmed Notes Nurse Date/Time  (Eastern ?Time) ?pregnant, chronic ?lung disease or other ?chronic medical ?condition) AND [2] ?COVID symptoms ?(e.g., cough, fever) ?(Exceptions: Already ?seen by PCP and no ?new or worsening ?symptoms.) ?Disp. Time (Eastern ?Time) Disposition Final User ?12/16/2021 8:54:23 AM Call PCP within 24 Hours Yes Linward Headland, [place], [place] ?Caller Disagree/Comply Comply ?Caller Understands Yes ?PreDisposition Did not know what to do ?Care Advice Given Per Guideline ?CALL PCP WITHIN 24 HOURS: * You need to discuss this with your doctor (or NP/PA) within the next 24 hours. * IF OFFICE ?WILL BE OPEN: Call the office when it opens tomorrow morning. ALTERNATE DISPOSITION - TELEMEDICINE WITHIN 24 ?HOURS: * Telemedicine may be your best choice for care during this COVID-19 outbreak. CALL BACK IF: * You become worse ?CARE ADVICE given per COVID-19 - DIAGNOSED OR SUSPECTED (Adult) guideline. * You should call a telemedicine doctor ?(or NP/PA) within the next 24 hours, if your own doctor is not available. ?Comments ?User: Jodelle Green, RN Date/Time Eilene Ghazi Time): 12/16/2021 8:55:42 AM ?No on call provider available for triage calls at the moment, directed caller to seek care via tele medicine platform ?or UC. ?Referrals ?GO TO FACILITY UNDECIDED ?

## 2021-12-18 NOTE — Telephone Encounter (Signed)
Patient scheduled for virtual visit Wednesday  ?

## 2021-12-18 NOTE — Telephone Encounter (Signed)
Yes please have him schedule a visit. ? ?Nicholas Bird. [name], MD ?12/18/2021 9:08 AM  ? ?

## 2021-12-18 NOTE — Telephone Encounter (Signed)
Would you like me to call pt to schedule a VV?  ?

## 2021-12-20 ENCOUNTER — Telehealth (INDEPENDENT_AMBULATORY_CARE_PROVIDER_SITE_OTHER): Payer: Medicare HMO | Admitting: Family Medicine

## 2021-12-20 ENCOUNTER — Encounter: Payer: Self-pay | Admitting: Family Medicine

## 2021-12-20 VITALS — Temp 97.4°F | Ht 71.0 in | Wt 190.0 lb

## 2021-12-20 DIAGNOSIS — U071 COVID-19: Secondary | ICD-10-CM | POA: Diagnosis not present

## 2021-12-20 NOTE — Progress Notes (Signed)
? ?  Nicholas Bird is a 81 y.o. male who presents today for a virtual office visit. ? ?Assessment/Plan:  ?New/Acute Problems: ?COVID ?No red flags.  Symptoms have been very mild and have improved significantly.  He is on day 7 of illness.  Do not think he would have much benefit from antiviral at this point.  We will continue with conservative management.  He can use over-the-counter meds as needed.  We discussed prescription cough medication however he deferred.  We discussed reasons to return to care.  Follow-up as needed. ? ? ?  ?Subjective:  ?HPI: ? ?Patient here with covid. Symptoms started about 7 days ago. Tested positive 5 days ago. Symptoms are very mild. Initially had cough and congestion. Symptoms have mostly resolved. No fevers or chills. No chest pain or shortness of breath. Wife has been sick with similar symptoms.  ? ?   ?  ?Objective/Observations  ?Physical Exam: ?Gen: NAD, resting comfortably ?Pulm: Normal work of breathing ?Neuro: Grossly normal, moves all extremities ?Psych: Normal affect and thought content ? ?Virtual Visit via Video  ? ?I connected with Nicholas Bird on 12/20/21 at  9:40 AM EDT by a video enabled telemedicine application and verified that I am speaking with the correct person using two identifiers. The limitations of evaluation and management by telemedicine and the availability of in person appointments were discussed. The patient expressed understanding and agreed to proceed.  ? ?Patient location: Home ?Provider location: [place] ?Persons participating in the virtual visit: Myself and Patient ?   ? ?Algis Greenhouse. [name], MD ?12/20/2021 10:21 AM  ?

## 2022-01-08 ENCOUNTER — Other Ambulatory Visit: Payer: Self-pay | Admitting: *Deleted

## 2022-01-08 MED ORDER — AMLODIPINE BESYLATE 5 MG PO TABS: ORAL_TABLET | ORAL | 0 refills | Status: DC

## 2022-01-08 MED ORDER — FUROSEMIDE 40 MG PO TABS: 40.0000 mg | ORAL_TABLET | ORAL | 0 refills | Status: DC

## 2022-01-10 ENCOUNTER — Other Ambulatory Visit: Payer: Self-pay | Admitting: *Deleted

## 2022-01-10 MED ORDER — CANDESARTAN CILEXETIL-HCTZ 32-12.5 MG PO TABS: 1.0000 | ORAL_TABLET | Freq: Every day | ORAL | 0 refills | Status: DC

## 2022-01-23 DIAGNOSIS — D034 Melanoma in situ of scalp and neck: Secondary | ICD-10-CM | POA: Diagnosis not present

## 2022-01-23 DIAGNOSIS — C434 Malignant melanoma of scalp and neck: Secondary | ICD-10-CM | POA: Diagnosis not present

## 2022-01-23 DIAGNOSIS — Z85820 Personal history of malignant melanoma of skin: Secondary | ICD-10-CM | POA: Diagnosis not present

## 2022-01-23 DIAGNOSIS — D485 Neoplasm of uncertain behavior of skin: Secondary | ICD-10-CM | POA: Diagnosis not present

## 2022-01-31 DIAGNOSIS — D485 Neoplasm of uncertain behavior of skin: Secondary | ICD-10-CM | POA: Diagnosis not present

## 2022-02-07 DIAGNOSIS — C434 Malignant melanoma of scalp and neck: Secondary | ICD-10-CM | POA: Diagnosis not present

## 2022-02-08 ENCOUNTER — Telehealth: Payer: Self-pay | Admitting: Family Medicine

## 2022-02-08 NOTE — Telephone Encounter (Signed)
FYI--Pt called stating he needed to have certain labs completed prior to his cancer treatments. He is going to have his provider from [facility] to send over orders to be processed. He is also going to have a nurse reach out to Dr. Marigene Ehlers nurse to coordinate having labs completed here as well.

## 2022-02-14 DIAGNOSIS — C434 Malignant melanoma of scalp and neck: Secondary | ICD-10-CM | POA: Diagnosis not present

## 2022-02-14 DIAGNOSIS — R590 Localized enlarged lymph nodes: Secondary | ICD-10-CM | POA: Diagnosis not present

## 2022-02-14 DIAGNOSIS — N400 Enlarged prostate without lower urinary tract symptoms: Secondary | ICD-10-CM | POA: Diagnosis not present

## 2022-02-14 DIAGNOSIS — Z79899 Other long term (current) drug therapy: Secondary | ICD-10-CM | POA: Diagnosis not present

## 2022-02-16 ENCOUNTER — Ambulatory Visit (INDEPENDENT_AMBULATORY_CARE_PROVIDER_SITE_OTHER): Payer: Medicare HMO

## 2022-02-16 DIAGNOSIS — Z0000 Encounter for general adult medical examination without abnormal findings: Secondary | ICD-10-CM | POA: Diagnosis not present

## 2022-02-16 NOTE — Patient Instructions (Signed)
Nicholas Bird , Thank you for taking time to come for your Medicare Wellness Visit. I appreciate your ongoing commitment to your health goals. Please review the following plan we discussed and let me know if I can assist you in the future.   Screening recommendations/referrals: Colonoscopy: Done 04/10/21 repeat every 5 years as directed  Recommended yearly ophthalmology/optometry visit for glaucoma screening and checkup Recommended yearly dental visit for hygiene and checkup  Vaccinations: Influenza vaccine: Done 05/29/21 repeat every year  Pneumococcal vaccine: Up to date Tdap vaccine: Done 09/14/14 repeat every 10 years  Shingles vaccine: Shingrix discussed. Please contact your pharmacy for coverage information.    Covid-19: Completed 1/20, 2/10, 06/05/20 & 05/18/21  Advanced directives: Please bring a copy of your health care power of attorney and living will to the office at your convenience.  Conditions/risks identified: None at this time   Next appointment: Follow up in one year for your annual wellness visit.   Preventive Care 48 Years and Older, Male Preventive care refers to lifestyle choices and visits with your health care provider that can promote health and wellness. What does preventive care include? A yearly physical exam. This is also called an annual well check. Dental exams once or twice a year. Routine eye exams. Ask your health care provider how often you should have your eyes checked. Personal lifestyle choices, including: Daily care of your teeth and gums. Regular physical activity. Eating a healthy diet. Avoiding tobacco and drug use. Limiting alcohol use. Practicing safe sex. Taking low doses of aspirin every day. Taking vitamin and mineral supplements as recommended by your health care provider. What happens during an annual well check? The services and screenings done by your health care provider during your annual well check will depend on your age, overall  health, lifestyle risk factors, and family history of disease. Counseling  Your health care provider may ask you questions about your: Alcohol use. Tobacco use. Drug use. Emotional well-being. Home and relationship well-being. Sexual activity. Eating habits. History of falls. Memory and ability to understand (cognition). Work and work Statistician. Screening  You may have the following tests or measurements: Height, weight, and BMI. Blood pressure. Lipid and cholesterol levels. These may be checked every 5 years, or more frequently if you are over 64 years old. Skin check. Lung cancer screening. You may have this screening every year starting at age 63 if you have a 30-pack-year history of smoking and currently smoke or have quit within the past 15 years. Fecal occult blood test (FOBT) of the stool. You may have this test every year starting at age 67. Flexible sigmoidoscopy or colonoscopy. You may have a sigmoidoscopy every 5 years or a colonoscopy every 10 years starting at age 53. Prostate cancer screening. Recommendations will vary depending on your family history and other risks. Hepatitis C blood test. Hepatitis B blood test. Sexually transmitted disease (STD) testing. Diabetes screening. This is done by checking your blood sugar (glucose) after you have not eaten for a while (fasting). You may have this done every 1-3 years. Abdominal aortic aneurysm (AAA) screening. You may need this if you are a current or former smoker. Osteoporosis. You may be screened starting at age 73 if you are at high risk. Talk with your health care provider about your test results, treatment options, and if necessary, the need for more tests. Vaccines  Your health care provider may recommend certain vaccines, such as: Influenza vaccine. This is recommended every year. Tetanus, diphtheria, and  acellular pertussis (Tdap, Td) vaccine. You may need a Td booster every 10 years. Zoster vaccine. You may  need this after age 51. Pneumococcal 13-valent conjugate (PCV13) vaccine. One dose is recommended after age 65. Pneumococcal polysaccharide (PPSV23) vaccine. One dose is recommended after age 72. Talk to your health care provider about which screenings and vaccines you need and how often you need them. This information is not intended to replace advice given to you by your health care provider. Make sure you discuss any questions you have with your health care provider. Document Released: 09/16/2015 Document Revised: 05/09/2016 Document Reviewed: 06/21/2015 Elsevier Interactive Patient Education  2017 [place] Prevention in the Home Falls can cause injuries. They can happen to people of all ages. There are many things you can do to make your home safe and to help prevent falls. What can I do on the outside of my home? Regularly fix the edges of walkways and driveways and fix any cracks. Remove anything that might make you trip as you walk through a door, such as a raised step or threshold. Trim any bushes or trees on the path to your home. Use bright outdoor lighting. Clear any walking paths of anything that might make someone trip, such as rocks or tools. Regularly check to see if handrails are loose or broken. Make sure that both sides of any steps have handrails. Any raised decks and porches should have guardrails on the edges. Have any leaves, snow, or ice cleared regularly. Use sand or salt on walking paths during winter. Clean up any spills in your garage right away. This includes oil or grease spills. What can I do in the bathroom? Use night lights. Install grab bars by the toilet and in the tub and shower. Do not use towel bars as grab bars. Use non-skid mats or decals in the tub or shower. If you need to sit down in the shower, use a plastic, non-slip stool. Keep the floor dry. Clean up any water that spills on the floor as soon as it happens. Remove soap buildup in  the tub or shower regularly. Attach bath mats securely with double-sided non-slip rug tape. Do not have throw rugs and other things on the floor that can make you trip. What can I do in the bedroom? Use night lights. Make sure that you have a light by your bed that is easy to reach. Do not use any sheets or blankets that are too big for your bed. They should not hang down onto the floor. Have a firm chair that has side arms. You can use this for support while you get dressed. Do not have throw rugs and other things on the floor that can make you trip. What can I do in the kitchen? Clean up any spills right away. Avoid walking on wet floors. Keep items that you use a lot in easy-to-reach places. If you need to reach something above you, use a strong step stool that has a grab bar. Keep electrical cords out of the way. Do not use floor polish or wax that makes floors slippery. If you must use wax, use non-skid floor wax. Do not have throw rugs and other things on the floor that can make you trip. What can I do with my stairs? Do not leave any items on the stairs. Make sure that there are handrails on both sides of the stairs and use them. Fix handrails that are broken or loose. Make sure that  handrails are as long as the stairways. Check any carpeting to make sure that it is firmly attached to the stairs. Fix any carpet that is loose or worn. Avoid having throw rugs at the top or bottom of the stairs. If you do have throw rugs, attach them to the floor with carpet tape. Make sure that you have a light switch at the top of the stairs and the bottom of the stairs. If you do not have them, ask someone to add them for you. What else can I do to help prevent falls? Wear shoes that: Do not have high heels. Have rubber bottoms. Are comfortable and fit you well. Are closed at the toe. Do not wear sandals. If you use a stepladder: Make sure that it is fully opened. Do not climb a closed  stepladder. Make sure that both sides of the stepladder are locked into place. Ask someone to hold it for you, if possible. Clearly mark and make sure that you can see: Any grab bars or handrails. First and last steps. Where the edge of each step is. Use tools that help you move around (mobility aids) if they are needed. These include: Canes. Walkers. Scooters. Crutches. Turn on the lights when you go into a dark area. Replace any light bulbs as soon as they burn out. Set up your furniture so you have a clear path. Avoid moving your furniture around. If any of your floors are uneven, fix them. If there are any pets around you, be aware of where they are. Review your medicines with your doctor. Some medicines can make you feel dizzy. This can increase your chance of falling. Ask your doctor what other things that you can do to help prevent falls. This information is not intended to replace advice given to you by your health care provider. Make sure you discuss any questions you have with your health care provider. Document Released: 06/16/2009 Document Revised: 01/26/2016 Document Reviewed: 09/24/2014 Elsevier Interactive Patient Education  2017 [employer].

## 2022-02-16 NOTE — Progress Notes (Addendum)
Virtual Visit via Telephone Note  I connected with  Nicholas Bird on 02/16/22 at 10:30 AM EDT by telephone and verified that I am speaking with the correct person using two identifiers.  Medicare Annual Wellness visit completed telephonically due to Covid-19 pandemic.   Persons participating in this call: This Health Coach and this patient.   Location: Patient: Home Provider: Office    I discussed the limitations, risks, security and privacy concerns of performing an evaluation and management service by telephone and the availability of in person appointments. The patient expressed understanding and agreed to proceed.  Unable to perform video visit due to video visit attempted and failed and/or patient does not have video capability.   Some vital signs may be absent or patient reported.   Nicholas Brace, LPN   Subjective:   Nicholas Bird is a 81 y.o. male who presents for Medicare Annual/Subsequent preventive examination.  Review of Systems     Cardiac Risk Factors include: advanced age (>30mn, >>31women);dyslipidemia;hypertension;male gender     Objective:    There were no vitals filed for this visit. There is no height or weight on file to calculate BMI.     02/16/2022   10:38 AM 09/30/2021    6:44 PM 09/27/2021   10:24 PM 09/16/2020    2:38 PM 12/05/2017    9:58 AM 11/16/2016    9:59 AM 12/14/2013    6:54 AM  Advanced Directives  Does Patient Have a Medical Advance Directive? Yes Yes No Yes No Yes Patient has advance directive, copy not in Bird  Type of Advance Directive Healthcare Power of [place]  [place] Bird  [place]own Does patient want to make changes to medical advance directive?       No change requested  Copy of [place] Bird? No - copy requested No - copy requested  No - copy requested  No - copy requested  Copy requested from family    Current Medications (verified) Outpatient Encounter Medications as of 02/16/2022  Medication Sig   allopurinol (ZYLOPRIM) 300 MG tablet TAKE 1 TABLET(300 MG) BY MOUTH DAILY   amLODipine (NORVASC) 5 MG tablet TAKE 1 TABLET(5 MG) BY MOUTH DAILY   apixaban (ELIQUIS) 5 MG TABS tablet TAKE 1 TABLET(5 MG) BY MOUTH TWICE DAILY   candesartan-hydrochlorothiazide (ATACAND HCT) 32-12.5 MG tablet Take 1 tablet by mouth daily.   ferrous sulfate 325 (65 FE) MG tablet Take 325 mg by mouth 2 (two) times daily with a meal.    finasteride (PROSCAR) 5 MG tablet Take 5 mg by mouth.   furosemide (LASIX) 40 MG tablet Take 1 tablet (40 mg total) by mouth every other day. May take extra as needed.   metoprolol succinate (TOPROL-XL) 50 MG 24 hr tablet Take with or immediately following a meal.   omeprazole (PRILOSEC) 20 MG capsule Take 1 capsule (20 mg total) by mouth daily.   ampicillin (PRINCIPEN) 500 MG capsule Take 500 mg by mouth 2 (two) times daily as needed. (Patient not taking: Reported on 12/20/2021)   No facility-administered encounter medications on file as of 02/16/2022.    Allergies (verified) Patient has no known allergies.   History: Past Medical History:  Diagnosis Date   Anticoagulant long-term use    Atrial fibrillation, permanent (HCC)    CARIOLOGIST-  DR KCaryl Comes  Bladder diverticulum    BPH (benign prostatic  hypertrophy)    ED (erectile dysfunction) of organic origin    Elevated LFTs    CHRONIC   Gall stones    Gout    per pt 12-09-2013  gout is stable   Hematuria    History of pancreatitis    10/2012--  resolved    History of TIA (transient ischemic attack)    04/2009--  no residual   Hyperlipemia    Hypertension    Liver disease    Melanoma ([place])    Personal history of colonic polyps 07/12/2008   TUBULAR ADENOMA   Rosacea    Past Surgical History:  Procedure Laterality Date   CARDIAC CATHETERIZATION  09-19-1998   false positive stress test---   normal cath and preserved lvf   CATARACT EXTRACTION W/ INTRAOCULAR LENS  IMPLANT, BILATERAL Bilateral 2008   CYSTOSCOPY WITH BIOPSY N/A 12/14/2013   Procedure: CYSTOSCOPY WITH BIOPSY;  Surgeon: Franchot Gallo, MD;  Location: [facility];  Service: Urology;  Laterality: N/A;   INGUINAL HERNIA REPAIR Left 01-14-2001   LAPAROSCOPIC CHOLECYSTECTOMY  10-21-2008   AND LIVER BX'S   LAPAROSCOPIC INGUINAL HERNIA REPAIR Bilateral 05-22-2009   Family History  Problem Relation Age of Onset   Lung cancer Mother    Skin cancer Sister    Colon cancer Neg Hx    Esophageal cancer Neg Hx    Pancreatic cancer Neg Hx    Stomach cancer Neg Hx    Rectal cancer Neg Hx    Social History   Socioeconomic History   Marital status: Married    Spouse name: Not on file   Number of children: 2   Years of education: Not on file   Highest education level: Not on file  Occupational History   Occupation: retired    Fish farm manager: RETIRED  Tobacco Use   Smoking status: Never   Smokeless tobacco: Never  Vaping Use   Vaping Use: Never used  Substance and Sexual Activity   Alcohol use: Yes    Alcohol/week: 14.0 standard drinks of alcohol    Types: 14 Shots of liquor per week    Comment: 3 oz. per day   Drug use: No   Sexual activity: Yes    Partners: Female  Other Topics Concern   Not on file  Social History Narrative   Not on file   Social Determinants of Health   Financial Resource Strain: Low Risk  (02/16/2022)   Overall Financial Resource Strain (CARDIA)    Difficulty of Paying Living Expenses: Not hard at all  Food Insecurity: No Food Insecurity (02/16/2022)   Hunger Vital Sign    Worried About Running Out of Food in the Last Year: Never true    [place] in the Last Year: Never true  Transportation Needs: No Transportation Needs (02/16/2022)   PRAPARE - Hydrologist (Medical): No    Lack of Transportation (Non-Medical): No  Physical Activity:  Sufficiently Active (02/16/2022)   Exercise Vital Sign    Days of Exercise per Week: 6 days    Minutes of Exercise per Session: 30 min  Stress: No Stress Concern Present (02/16/2022)   [place]    Feeling of Stress : Not at all  Social Connections: [place] (02/16/2022)   Social Connection and Isolation Panel [NHANES]    Frequency of Communication with Friends and Family: Three times a week    Frequency of Social Gatherings with  Friends and Family: Twice a week    Attends Religious Services: More than 4 times per year    Active Member of Clubs or Organizations: Yes    Attends Music therapist: More than 4 times per year    Marital Status: Married    Tobacco Counseling Counseling given: Not Answered   Clinical Intake:  Pre-visit preparation completed: Yes  Pain : No/denies pain     BMI - recorded: 26.51 Nutritional Status: BMI 25 -29 Overweight Nutritional Risks: None Diabetes: No  How often do you need to have someone help you when you read instructions, pamphlets, or other written materials from your doctor or pharmacy?: 1 - Never  Diabetic?no  Interpreter Needed?: No      Activities of Daily Living    02/16/2022   10:40 AM  In your present state of health, do you have any difficulty performing the following activities:  Hearing? 0  Vision? 0  Difficulty concentrating or making decisions? 0  Walking or climbing stairs? 0  Dressing or bathing? 0  Doing errands, shopping? 0  Preparing Food and eating ? N  Using the Toilet? N  In the past six months, have you accidently leaked urine? N  Do you have problems with loss of bowel control? N  Managing your Medications? N  Managing your Finances? N  Housekeeping or managing your Housekeeping? N    Patient Care Team: Vivi Barrack, MD as PCP - General (Family Medicine) Deboraha Sprang, MD as PCP - Electrophysiology  (Cardiology) Deboraha Sprang, MD (Cardiology) Franchot Gallo, MD as Attending Physician (Urology) Jarome Matin, MD as Consulting Physician (Dermatology) Katy Apo, MD as Consulting Physician (Ophthalmology) Irene Shipper, MD as Consulting Physician (Gastroenterology) Madelin Rear, [facility] as Pharmacist (Pharmacist)  Indicate any recent Medical Services you may have received from other than [facility] providers in the past year (date may be approximate).     Assessment:   This is a routine wellness examination for [place].  Hearing/Vision screen Hearing Screening - Comments:: Pt denies any hearing issues  Vision Screening - Comments:: Pt follows up with [facility]    Dietary issues and exercise activities discussed: Current Exercise Habits: Home exercise routine, Type of exercise: Other - see comments;walking, Time (Minutes): 30, Frequency (Times/Week): 6, Weekly Exercise (Minutes/Week): 180   Goals Addressed             This Visit's Progress    Patient Stated       Maintain current health        Depression Screen    02/16/2022   10:37 AM 12/20/2021    9:42 AM 11/30/2021    1:13 PM 09/16/2020    2:36 PM 04/20/2019    9:18 AM 05/13/2018    1:24 PM  PHQ 2/9 Scores  PHQ - 2 Score 0 0 0 0 0 0    Fall Risk    02/16/2022   10:40 AM 12/20/2021    9:42 AM 11/30/2021    1:13 PM 09/16/2020    2:40 PM 05/13/2018    1:24 PM  [place] in the past year? 0 0 0 0 No  Number falls in past yr: 0 0 0 0   Injury with Fall? 0 0 0 0   Risk for fall due to : Impaired vision  No Fall Risks Impaired vision   Follow up Falls prevention discussed  Falls evaluation completed      FALL RISK PREVENTION PERTAINING TO  THE HOME:  Any stairs in or around the home? Yes  If so, are there any without handrails? No  Home free of loose throw rugs in walkways, pet beds, electrical cords, etc? Yes  Adequate lighting in your home to reduce risk of falls? Yes   ASSISTIVE DEVICES  UTILIZED TO PREVENT FALLS:  Life alert? No  Use of a cane, walker or w/c? No  Grab bars in the bathroom? Yes  Shower chair or bench in shower? Yes  Elevated toilet seat or a handicapped toilet? Yes   TIMED UP AND GO:  Was the test performed? No .  Cognitive Function:        02/16/2022   10:41 AM 09/16/2020    2:44 PM  6CIT Screen  What Year? 0 points 0 points  What month? 0 points 0 points  What time? 0 points   Count back from 20 0 points 0 points  Months in reverse 0 points 0 points  Repeat phrase 0 points 0 points  Total Score 0 points     Immunizations Immunization History  Administered Date(s) Administered   Fluad Quad(high Dose 65+) 05/19/2019, 06/06/2020   Influenza Split 07/09/2012, 07/08/2013, 07/19/2016   Influenza Whole 08/09/2009   Influenza, High Dose Seasonal PF 06/04/2018   Influenza-Unspecified 07/05/2015, 05/29/2021   PFIZER(Purple Top)SARS-COV-2 Vaccination 09/23/2019, 10/14/2019, 06/05/2020, 05/18/2021   Pneumococcal Conjugate-13 09/14/2014   Pneumococcal Polysaccharide-23 07/09/2012   Td 09/03/2004   Tdap 09/14/2014    TDAP status: Up to date  Flu Vaccine status: Up to date  Pneumococcal vaccine status: Up to date  Covid-19 vaccine status: Completed vaccines  Qualifies for Shingles Vaccine? Yes   Zostavax completed Yes   Shingrix Completed?: Yes  Screening Tests Health Maintenance  Topic Date Due   COVID-19 Vaccine (5 - Booster for Pfizer series) 07/13/2021   Zoster Vaccines- Shingrix (1 of 2) 03/02/2022 (Originally 10/22/1959)   INFLUENZA VACCINE  04/03/2022   TETANUS/TDAP  09/14/2024   COLONOSCOPY (Pts 45-32yr Insurance coverage Bird need to be confirmed)  04/10/2026   Pneumonia Vaccine 80 Years old  Completed   HPV VACCINES  Aged Out    Health Maintenance  Health Maintenance Due  Topic Date Due   COVID-19 Vaccine (5 - Booster for PBataviaseries) 07/13/2021    Colorectal cancer screening: Type of screening: Colonoscopy.  Completed 04/10/21. Repeat every 5 years   Additional Screening:  Vision Screening: Recommended annual ophthalmology exams for early detection of glaucoma and other disorders of the eye. Is the patient up to date with their annual eye exam?  Yes  Who is the provider or what is the name of the office in which the patient attends annual eye exams? Dr GKaty Apo If pt is not established with a provider, would they like to be referred to a provider to establish care? No .   Dental Screening: Recommended annual dental exams for proper oral hygiene  Community Resource Referral / Chronic Care Management: CRR required this visit?  No   CCM required this visit?  No      Plan:     I have personally reviewed and noted the following in the patient's Bird:   Medical and social history Use of alcohol, tobacco or illicit drugs  Current medications and supplements including opioid prescriptions. Patient is not currently taking opioid prescriptions. Functional ability and status Nutritional status Physical activity Advanced directives List of other physicians Hospitalizations, surgeries, and ER visits in previous 12 months Vitals Screenings to include  cognitive, depression, and falls Referrals and appointments  In addition, I have reviewed and discussed with patient certain preventive protocols, quality metrics, and best practice recommendations. A written personalized care plan for preventive services as well as general preventive health recommendations were provided to patient.     Nicholas Brace, LPN   12/15/2438   Nurse Notes:  Pt stated he believed his 11.8 HGB and wanted to know if he could take 3 pills of iron daily instead of 2 please advise

## 2022-02-21 ENCOUNTER — Telehealth: Payer: Self-pay

## 2022-02-22 DIAGNOSIS — Z5111 Encounter for antineoplastic chemotherapy: Secondary | ICD-10-CM | POA: Diagnosis not present

## 2022-02-22 DIAGNOSIS — C434 Malignant melanoma of scalp and neck: Secondary | ICD-10-CM | POA: Diagnosis not present

## 2022-02-22 DIAGNOSIS — Z79899 Other long term (current) drug therapy: Secondary | ICD-10-CM | POA: Diagnosis not present

## 2022-02-22 NOTE — Telephone Encounter (Signed)
Noted thanks °

## 2022-03-15 DIAGNOSIS — C4339 Malignant melanoma of other parts of face: Secondary | ICD-10-CM | POA: Diagnosis not present

## 2022-03-15 DIAGNOSIS — Z79899 Other long term (current) drug therapy: Secondary | ICD-10-CM | POA: Diagnosis not present

## 2022-03-15 DIAGNOSIS — C434 Malignant melanoma of scalp and neck: Secondary | ICD-10-CM | POA: Diagnosis not present

## 2022-03-15 DIAGNOSIS — Z5111 Encounter for antineoplastic chemotherapy: Secondary | ICD-10-CM | POA: Diagnosis not present

## 2022-03-16 ENCOUNTER — Other Ambulatory Visit: Payer: Self-pay | Admitting: *Deleted

## 2022-03-16 MED ORDER — APIXABAN 5 MG PO TABS: ORAL_TABLET | ORAL | 5 refills | Status: DC

## 2022-03-16 NOTE — Telephone Encounter (Signed)
Prescription refill request for Eliquis received.  Indication: afib  Last office visit: [place], 02/20/2022 Scr: 1.29, 02/14/2022 Age: 81 yo  Weight: 86.2 kg   Refill sent.

## 2022-03-22 DIAGNOSIS — L821 Other seborrheic keratosis: Secondary | ICD-10-CM | POA: Diagnosis not present

## 2022-03-22 DIAGNOSIS — C44519 Basal cell carcinoma of skin of other part of trunk: Secondary | ICD-10-CM | POA: Diagnosis not present

## 2022-03-22 DIAGNOSIS — L812 Freckles: Secondary | ICD-10-CM | POA: Diagnosis not present

## 2022-03-22 DIAGNOSIS — D485 Neoplasm of uncertain behavior of skin: Secondary | ICD-10-CM | POA: Diagnosis not present

## 2022-03-22 DIAGNOSIS — C44719 Basal cell carcinoma of skin of left lower limb, including hip: Secondary | ICD-10-CM | POA: Diagnosis not present

## 2022-03-22 DIAGNOSIS — L570 Actinic keratosis: Secondary | ICD-10-CM | POA: Diagnosis not present

## 2022-03-22 DIAGNOSIS — C4441 Basal cell carcinoma of skin of scalp and neck: Secondary | ICD-10-CM | POA: Diagnosis not present

## 2022-03-22 DIAGNOSIS — Z85820 Personal history of malignant melanoma of skin: Secondary | ICD-10-CM | POA: Diagnosis not present

## 2022-03-22 DIAGNOSIS — Z85828 Personal history of other malignant neoplasm of skin: Secondary | ICD-10-CM | POA: Diagnosis not present

## 2022-03-29 DIAGNOSIS — Z5112 Encounter for antineoplastic immunotherapy: Secondary | ICD-10-CM | POA: Diagnosis not present

## 2022-03-29 DIAGNOSIS — C434 Malignant melanoma of scalp and neck: Secondary | ICD-10-CM | POA: Diagnosis not present

## 2022-03-29 DIAGNOSIS — Z801 Family history of malignant neoplasm of trachea, bronchus and lung: Secondary | ICD-10-CM | POA: Diagnosis not present

## 2022-03-29 DIAGNOSIS — Z808 Family history of malignant neoplasm of other organs or systems: Secondary | ICD-10-CM | POA: Diagnosis not present

## 2022-03-29 DIAGNOSIS — Z7901 Long term (current) use of anticoagulants: Secondary | ICD-10-CM | POA: Diagnosis not present

## 2022-03-29 DIAGNOSIS — Z79899 Other long term (current) drug therapy: Secondary | ICD-10-CM | POA: Diagnosis not present

## 2022-03-29 DIAGNOSIS — I2510 Atherosclerotic heart disease of native coronary artery without angina pectoris: Secondary | ICD-10-CM | POA: Diagnosis not present

## 2022-04-10 ENCOUNTER — Other Ambulatory Visit: Payer: Self-pay | Admitting: Cardiology

## 2022-04-11 ENCOUNTER — Other Ambulatory Visit: Payer: Self-pay

## 2022-04-11 MED ORDER — AMLODIPINE BESYLATE 5 MG PO TABS: ORAL_TABLET | ORAL | 1 refills | Status: DC

## 2022-04-12 DIAGNOSIS — Z5112 Encounter for antineoplastic immunotherapy: Secondary | ICD-10-CM | POA: Diagnosis not present

## 2022-04-12 DIAGNOSIS — C434 Malignant melanoma of scalp and neck: Secondary | ICD-10-CM | POA: Diagnosis not present

## 2022-04-12 DIAGNOSIS — Z79899 Other long term (current) drug therapy: Secondary | ICD-10-CM | POA: Diagnosis not present

## 2022-04-17 ENCOUNTER — Telehealth: Payer: Self-pay | Admitting: Family Medicine

## 2022-04-17 NOTE — Telephone Encounter (Signed)
..  Type of form received: well spring release form  Additional comments:   Received by: patient drop off / well [facility]  Form should be Faxed to: NA   Form should be mailed to:  Patients home address  Is patient requesting call for pickup: NO    Form placed:  Dr Ellwood Handler folder   Attach charge sheet.  Yes   Individual made aware of 3-5 business day turn around (Y/N)?   Yes

## 2022-04-20 NOTE — Telephone Encounter (Signed)
Form placed on PCP office to be sign

## 2022-04-20 NOTE — Telephone Encounter (Signed)
Form faxed to [phone]

## 2022-04-26 DIAGNOSIS — Z79899 Other long term (current) drug therapy: Secondary | ICD-10-CM | POA: Diagnosis not present

## 2022-04-26 DIAGNOSIS — C434 Malignant melanoma of scalp and neck: Secondary | ICD-10-CM | POA: Diagnosis not present

## 2022-04-26 DIAGNOSIS — C4339 Malignant melanoma of other parts of face: Secondary | ICD-10-CM | POA: Diagnosis not present

## 2022-04-26 DIAGNOSIS — Z5111 Encounter for antineoplastic chemotherapy: Secondary | ICD-10-CM | POA: Diagnosis not present

## 2022-05-09 ENCOUNTER — Telehealth: Payer: Self-pay | Admitting: *Deleted

## 2022-05-09 DIAGNOSIS — R911 Solitary pulmonary nodule: Secondary | ICD-10-CM | POA: Diagnosis not present

## 2022-05-09 DIAGNOSIS — C434 Malignant melanoma of scalp and neck: Secondary | ICD-10-CM | POA: Diagnosis not present

## 2022-05-09 NOTE — Patient Outreach (Signed)
  Care Coordination   05/09/2022 Name: Nicholas Bird MRN: [id number] DOB: [birth year]   Care Coordination Outreach Attempts:  An unsuccessful telephone outreach was attempted today to offer the patient information about available care coordination services as a benefit of their health plan.   Follow Up Plan:  Additional outreach attempts will be made to offer the patient care coordination information and services.   Encounter Outcome:  Pt. Request to Call Back  Care Coordination Interventions Activated:  No   Care Coordination Interventions:  No, not indicated     Raina Mina, RN Care Management Coordinator [place] Office [phone]

## 2022-05-10 DIAGNOSIS — Z79899 Other long term (current) drug therapy: Secondary | ICD-10-CM | POA: Diagnosis not present

## 2022-05-10 DIAGNOSIS — C434 Malignant melanoma of scalp and neck: Secondary | ICD-10-CM | POA: Diagnosis not present

## 2022-05-28 ENCOUNTER — Encounter: Payer: Self-pay | Admitting: *Deleted

## 2022-05-28 DIAGNOSIS — C4330 Malignant melanoma of unspecified part of face: Secondary | ICD-10-CM | POA: Diagnosis not present

## 2022-05-28 DIAGNOSIS — Z808 Family history of malignant neoplasm of other organs or systems: Secondary | ICD-10-CM | POA: Diagnosis not present

## 2022-05-28 DIAGNOSIS — Z801 Family history of malignant neoplasm of trachea, bronchus and lung: Secondary | ICD-10-CM | POA: Diagnosis not present

## 2022-05-28 DIAGNOSIS — C4339 Malignant melanoma of other parts of face: Secondary | ICD-10-CM | POA: Diagnosis not present

## 2022-05-28 DIAGNOSIS — L905 Scar conditions and fibrosis of skin: Secondary | ICD-10-CM | POA: Diagnosis not present

## 2022-05-28 DIAGNOSIS — C434 Malignant melanoma of scalp and neck: Secondary | ICD-10-CM | POA: Diagnosis not present

## 2022-05-31 ENCOUNTER — Telehealth: Payer: Self-pay | Admitting: *Deleted

## 2022-05-31 ENCOUNTER — Encounter: Payer: Self-pay | Admitting: *Deleted

## 2022-05-31 NOTE — Patient Outreach (Signed)
  Care Coordination   Initial Visit Note   05/31/2022 Name: Nicholas Bird MRN: [id number] DOB: 20-Jun-1941  Nicholas Bird is a 81 y.o. year old male who sees Nicholas Barrack, MD for primary care. I spoke with  Nicholas Bird by phone today.  What matters to the patients health and wellness today?  Gym Membership clearance form    Goals Addressed               This Visit's Progress     COMPLETED: signature form for GYM (pt-stated)        Care Coordination Interventions: Provided education to patient and/or caregiver about advanced directives Collaborated with Dr/ Nicholas Bird regarding pt's request for signature on a gym form that pt dropped off at the office to start exercise program (pt needed clearance). Reviewed scheduled/upcoming provider appointments including pending appointments and verified pt has had his AWV this year. Screening for signs and symptoms of depression related to chronic disease state  Assessed social determinant of health barriers Encouraged pt to follow up with his previous address if care paperwork was mailed to the previous address (pt recent moved).          SDOH assessments and interventions completed:  Yes  SDOH Interventions Today    Flowsheet Row Most Recent Value  SDOH Interventions   Food Insecurity Interventions Intervention Not Indicated  Housing Interventions Intervention Not Indicated  Transportation Interventions Intervention Not Indicated  Utilities Interventions Intervention Not Indicated        Care Coordination Interventions Activated:  Yes  Care Coordination Interventions:  Yes, provided   Follow up plan: No further intervention required.   Encounter Outcome:  Pt. Visit Completed   Nicholas Mina, RN Care Management Coordinator [place] Office [phone]

## 2022-05-31 NOTE — Patient Instructions (Signed)
Visit Information  Thank you for taking time to visit with me today. Please don't hesitate to contact me if I can be of assistance to you.   Following are the goals we discussed today:   Goals Addressed               This Visit's Progress     COMPLETED: signature form for GYM (pt-stated)        Care Coordination Interventions: Provided education to patient and/or caregiver about advanced directives Collaborated with Dr/ [name] regarding pt's request for signature on a gym form that pt dropped off at the office to start exercise program (pt needed clearance). Reviewed scheduled/upcoming provider appointments including pending appointments and verified pt has had his AWV this year. Screening for signs and symptoms of depression related to chronic disease state  Assessed social determinant of health barriers Encouraged pt to follow up with his previous address if care paperwork was mailed to the previous address (pt recent moved).          Please call the care guide team at [phone] if you need to cancel or reschedule your appointment.   If you are experiencing a Mental Health or [place] or need someone to talk to, please call the Suicide and Crisis Lifeline: 988  Patient verbalizes understanding of instructions and care plan provided today and agrees to view in [place]. Active MyChart status and patient understanding of how to access instructions and care plan via MyChart confirmed with patient.     No further follow up required: No needs  Raina Mina, RN Care Management Coordinator [place] Office [phone]

## 2022-06-06 DIAGNOSIS — C434 Malignant melanoma of scalp and neck: Secondary | ICD-10-CM | POA: Diagnosis not present

## 2022-06-06 DIAGNOSIS — Z09 Encounter for follow-up examination after completed treatment for conditions other than malignant neoplasm: Secondary | ICD-10-CM | POA: Diagnosis not present

## 2022-06-13 DIAGNOSIS — H40013 Open angle with borderline findings, low risk, bilateral: Secondary | ICD-10-CM | POA: Diagnosis not present

## 2022-06-27 DIAGNOSIS — Z09 Encounter for follow-up examination after completed treatment for conditions other than malignant neoplasm: Secondary | ICD-10-CM | POA: Diagnosis not present

## 2022-06-27 DIAGNOSIS — C434 Malignant melanoma of scalp and neck: Secondary | ICD-10-CM | POA: Diagnosis not present

## 2022-07-02 ENCOUNTER — Other Ambulatory Visit: Payer: Self-pay

## 2022-07-02 MED ORDER — FUROSEMIDE 40 MG PO TABS: 40.0000 mg | ORAL_TABLET | ORAL | 1 refills | Status: DC

## 2022-07-04 DIAGNOSIS — Z23 Encounter for immunization: Secondary | ICD-10-CM | POA: Diagnosis not present

## 2022-07-04 DIAGNOSIS — C434 Malignant melanoma of scalp and neck: Secondary | ICD-10-CM | POA: Diagnosis not present

## 2022-07-23 DIAGNOSIS — Z85828 Personal history of other malignant neoplasm of skin: Secondary | ICD-10-CM | POA: Diagnosis not present

## 2022-07-23 DIAGNOSIS — L821 Other seborrheic keratosis: Secondary | ICD-10-CM | POA: Diagnosis not present

## 2022-07-23 DIAGNOSIS — L570 Actinic keratosis: Secondary | ICD-10-CM | POA: Diagnosis not present

## 2022-07-23 DIAGNOSIS — Z85820 Personal history of malignant melanoma of skin: Secondary | ICD-10-CM | POA: Diagnosis not present

## 2022-07-23 DIAGNOSIS — D225 Melanocytic nevi of trunk: Secondary | ICD-10-CM | POA: Diagnosis not present

## 2022-07-23 DIAGNOSIS — L850 Acquired ichthyosis: Secondary | ICD-10-CM | POA: Diagnosis not present

## 2022-07-31 ENCOUNTER — Other Ambulatory Visit: Payer: Self-pay

## 2022-07-31 MED ORDER — CANDESARTAN CILEXETIL-HCTZ 32-12.5 MG PO TABS: 1.0000 | ORAL_TABLET | Freq: Every day | ORAL | 0 refills | Status: DC

## 2022-08-07 ENCOUNTER — Other Ambulatory Visit: Payer: Self-pay | Admitting: *Deleted

## 2022-08-07 MED ORDER — METOPROLOL SUCCINATE ER 50 MG PO TB24: ORAL_TABLET | ORAL | 0 refills | Status: DC

## 2022-08-07 MED ORDER — AMLODIPINE BESYLATE 5 MG PO TABS: ORAL_TABLET | ORAL | 0 refills | Status: DC

## 2022-08-09 ENCOUNTER — Telehealth: Payer: Self-pay | Admitting: Internal Medicine

## 2022-08-09 MED ORDER — METOPROLOL SUCCINATE ER 50 MG PO TB24: 50.0000 mg | ORAL_TABLET | Freq: Every day | ORAL | 0 refills | Status: DC

## 2022-08-09 MED ORDER — AMLODIPINE BESYLATE 5 MG PO TABS: ORAL_TABLET | ORAL | 0 refills | Status: DC

## 2022-08-09 MED ORDER — FUROSEMIDE 40 MG PO TABS: 40.0000 mg | ORAL_TABLET | ORAL | 0 refills | Status: DC

## 2022-08-09 MED ORDER — CANDESARTAN CILEXETIL-HCTZ 32-12.5 MG PO TABS: 1.0000 | ORAL_TABLET | Freq: Every day | ORAL | 0 refills | Status: DC

## 2022-08-09 NOTE — Telephone Encounter (Signed)
*  STAT* If patient is at the pharmacy, call can be transferred to refill team.   1. Which medications need to be refilled? (please list name of each medication and dose if known)   candesartan-hydrochlorothiazide (ATACAND HCT) 32-12.5 MG tablet  amLODipine (NORVASC) 5 MG tablet  furosemide (LASIX) 40 MG tablet  metoprolol succinate (TOPROL-XL) 50 MG 24 hr tablet   2. Which pharmacy/location (including street and city if local pharmacy) is medication to be sent to?  [facility] [id number] - [place], [place] - [place]   3. Do they need a 30 day or 90 day supply?  90 day  Patient stated he is running out of these medications.  Patient has appointment scheduled on 10/10/22.

## 2022-08-09 NOTE — Telephone Encounter (Signed)
Rx refill sent to pharmacy. 

## 2022-09-12 ENCOUNTER — Other Ambulatory Visit: Payer: Self-pay | Admitting: Internal Medicine

## 2022-10-09 DIAGNOSIS — R001 Bradycardia, unspecified: Secondary | ICD-10-CM | POA: Insufficient documentation

## 2022-10-10 ENCOUNTER — Ambulatory Visit: Payer: Medicare HMO | Attending: Internal Medicine | Admitting: Internal Medicine

## 2022-10-10 ENCOUNTER — Encounter: Payer: Self-pay | Admitting: Internal Medicine

## 2022-10-10 VITALS — BP 116/74 | HR 73 | Ht 71.0 in | Wt 190.0 lb

## 2022-10-10 DIAGNOSIS — R001 Bradycardia, unspecified: Secondary | ICD-10-CM | POA: Diagnosis not present

## 2022-10-10 DIAGNOSIS — Z79899 Other long term (current) drug therapy: Secondary | ICD-10-CM | POA: Diagnosis not present

## 2022-10-10 MED ORDER — APIXABAN 5 MG PO TABS: ORAL_TABLET | ORAL | 3 refills | Status: DC

## 2022-10-10 MED ORDER — AMLODIPINE BESYLATE 5 MG PO TABS: 5.0000 mg | ORAL_TABLET | Freq: Every day | ORAL | 3 refills | Status: DC

## 2022-10-10 MED ORDER — FUROSEMIDE 40 MG PO TABS: 40.0000 mg | ORAL_TABLET | ORAL | 3 refills | Status: DC

## 2022-10-10 MED ORDER — CANDESARTAN CILEXETIL-HCTZ 32-12.5 MG PO TABS: 1.0000 | ORAL_TABLET | Freq: Every day | ORAL | 3 refills | Status: DC

## 2022-10-10 MED ORDER — METOPROLOL SUCCINATE ER 50 MG PO TB24: 50.0000 mg | ORAL_TABLET | Freq: Every day | ORAL | 3 refills | Status: DC

## 2022-10-10 NOTE — Progress Notes (Signed)
Patient Care Team: Vivi Barrack, MD as PCP - General (Family Medicine) Deboraha Sprang, MD as PCP - Electrophysiology (Cardiology) Deboraha Sprang, MD (Cardiology) Franchot Gallo, MD as Attending Physician (Urology) Jarome Matin, MD as Consulting Physician (Dermatology) Katy Apo, MD as Consulting Physician (Ophthalmology) Irene Shipper, MD as Consulting Physician (Gastroenterology) Madelin Rear, [facility] (Inactive) as Pharmacist (Pharmacist) Tobi Bastos, RN as [place] Management   HPI  Nicholas Bird is a 82 y.o. male seen in followup for atrial fibrillation which is permanent.  2013 ? TIA. There is apparently some lack of consensus as to whether this was a thromboembolic episode or not.   Ultimately decided to put him on oral anticoagulation therapy.   Coumadin>>Pradaxa. GI symptoms however intervened and he was changed to  Rivaroxaban >> apixoban   The patient denies chest pain,, nocturnal dyspnea, orthopnea or peripheral edema.  There have been no palpitations, lightheadedness or syncope.  Complains of some shortness of breath with exertion.   Recurrent melanoma; indeed had 4 or 5 procedures last year recurrent diverticulitis  DATE TEST EF   7/22 Echo   60-65 %                 Date Cr K Hgb  3/18 0.93  14.5  3/19 1.01  11.0  8/19   13.2  6/22 1.35 4.9 11.3               Records and Results Reviewed  Past Medical History:  Diagnosis Date   Anticoagulant long-term use    Atrial fibrillation, permanent ([place])    CARIOLOGIST-  DR Caryl Comes   Bladder diverticulum    BPH (benign prostatic hypertrophy)    ED (erectile dysfunction) of organic origin    Elevated LFTs    CHRONIC   Gall stones    Gout    per pt 12-09-2013  gout is stable   Hematuria    History of pancreatitis    10/2012--  resolved    History of TIA (transient ischemic attack)    04/2009--  no residual   Hyperlipemia    Hypertension    Liver disease    Melanoma  ([place])    Personal history of colonic polyps 07/12/2008   TUBULAR ADENOMA   Rosacea     Past Surgical History:  Procedure Laterality Date   CARDIAC CATHETERIZATION  09-19-1998   false positive stress test---  normal cath and preserved lvf   CATARACT EXTRACTION W/ INTRAOCULAR LENS  IMPLANT, BILATERAL Bilateral 2008   CYSTOSCOPY WITH BIOPSY N/A 12/14/2013   Procedure: CYSTOSCOPY WITH BIOPSY;  Surgeon: Franchot Gallo, MD;  Location: [facility];  Service: Urology;  Laterality: N/A;   INGUINAL HERNIA REPAIR Left 01-14-2001   LAPAROSCOPIC CHOLECYSTECTOMY  10-21-2008   AND LIVER BX'S   LAPAROSCOPIC INGUINAL HERNIA REPAIR Bilateral 05-22-2009    Current Meds  Medication Sig   allopurinol (ZYLOPRIM) 300 MG tablet TAKE 1 TABLET(300 MG) BY MOUTH DAILY   amLODipine (NORVASC) 5 MG tablet TAKE 1 TABLET(5 MG) BY MOUTH DAILY   ampicillin (PRINCIPEN) 500 MG capsule Take 500 mg by mouth 2 (two) times daily as needed.   apixaban (ELIQUIS) 5 MG TABS tablet TAKE 1 TABLET(5 MG) BY MOUTH TWICE DAILY   candesartan-hydrochlorothiazide (ATACAND HCT) 32-12.5 MG tablet Take 1 tablet by mouth daily.   ferrous sulfate 325 (65 FE) MG tablet Take 325 mg by mouth 2 (two) times daily with a meal.  finasteride (PROSCAR) 5 MG tablet Take 5 mg by mouth.   furosemide (LASIX) 40 MG tablet Take 1 tablet (40 mg total) by mouth every other day. May take extra as needed.   metoprolol succinate (TOPROL-XL) 50 MG 24 hr tablet Take 1 tablet (50 mg total) by mouth daily. Take with or immediately following a meal. Patient needs appointment for any future refills. Please call office at [phone].   omeprazole (PRILOSEC) 20 MG capsule Take 1 capsule (20 mg total) by mouth daily.    No Known Allergies    Review of Systems negative except from HPI and PMH  Physical Exam BP 116/74   Pulse 73   Ht '5\' 11"'$  (1.803 m)   Wt 190 lb (86.2 kg)   SpO2 99%   BMI 26.50 kg/m  Well developed and nourished in no  acute distress HENT normal Neck supple with JVP-flat Carotids brisk and full without bruits Clear Irregularly irregular rate and rhythm with controlled ventricular response, no murmurs or gallops Abd-soft with active BS without hepatomegaly No Clubbing cyanosis edema Skin-warm and dry A & Oriented  Grossly normal sensory and motor function   ECG: AF  '@61'$             Intervals  -/09/43  Axis 66     CrCl cannot be calculated (Patient's most recent lab result is older than the maximum 21 days allowed.).   Assessment and  Plan  Atrial fibrillation   Prior TIA   Bradycardia   Hypertension   Anemia   No overt bleeding.  Will check a hemoglobin.  Continue Eliquis; no interval neurological events  Blood pressure well-controlled without dizziness.  Continue amlodipine Atacand and metoprolol  Will check metabolic profile and potassium as he is on a diuretic   Current medicines are reviewed at length with the patient today .  The patient does not  have concerns regarding medicines.

## 2022-10-10 NOTE — Patient Instructions (Signed)
Medication Instructions:  Your physician recommends that you continue on your current medications as directed. Please refer to the Current Medication list given to you today.  *If you need a refill on your cardiac medications before your next appointment, please call your pharmacy*   Lab Work: CBC and BMET today  If you have labs (blood work) drawn today and your tests are completely normal, you will receive your results only by: [place] (if you have MyChart) OR A paper copy in the mail If you have any lab test that is abnormal or we need to change your treatment, we will call you to review the results.   Testing/Procedures: None ordered.    Follow-Up: At [facility], you and your health needs are our priority.  As part of our continuing mission to provide you with exceptional heart care, we have created designated Provider Care Teams.  These Care Teams include your primary Cardiologist (physician) and Advanced Practice Providers (APPs -  Physician Assistants and Nurse Practitioners) who all work together to provide you with the care you need, when you need it.  We recommend signing up for the patient portal called "MyChart".  Sign up information is provided on this After Visit Summary.  MyChart is used to connect with patients for Virtual Visits (Telemedicine).  Patients are able to view lab/test results, encounter notes, upcoming appointments, etc.  Non-urgent messages can be sent to your provider as well.   To learn more about what you can do with MyChart, go to [email or web address].    Your next appointment:   12 months with Dr Caryl Comes

## 2022-10-11 LAB — BASIC METABOLIC PANEL
BUN/Creatinine Ratio: 16 (ref 10–24)
BUN: 27 mg/dL (ref 8–27)
CO2: 22 mmol/L (ref 20–29)
Calcium: 9.7 mg/dL (ref 8.6–10.2)
Chloride: 94 mmol/L — ABNORMAL LOW (ref 96–106)
Creatinine, Ser: 1.65 mg/dL — ABNORMAL HIGH (ref 0.76–1.27)
Glucose: 97 mg/dL (ref 70–99)
Potassium: 4.4 mmol/L (ref 3.5–5.2)
Sodium: 133 mmol/L — ABNORMAL LOW (ref 134–144)
eGFR: 42 mL/min/{1.73_m2} — ABNORMAL LOW (ref >59)

## 2022-10-11 LAB — CBC
Hematocrit: 35.7 % — ABNORMAL LOW (ref 37.5–51.0)
Hemoglobin: 12.4 g/dL — ABNORMAL LOW (ref 13.0–17.7)
MCH: 35.9 pg — ABNORMAL HIGH (ref 26.6–33.0)
MCHC: 34.7 g/dL (ref 31.5–35.7)
MCV: 104 fL — ABNORMAL HIGH (ref 79–97)
Platelets: 245 10*3/uL (ref 150–450)
RBC: 3.45 x10E6/uL — ABNORMAL LOW (ref 4.14–5.80)
RDW: 12.6 % (ref 11.6–15.4)
WBC: 5.7 10*3/uL (ref 3.4–10.8)

## 2022-10-12 ENCOUNTER — Telehealth: Payer: Self-pay

## 2022-10-12 MED ORDER — APIXABAN 2.5 MG PO TABS: 2.5000 mg | ORAL_TABLET | Freq: Two times a day (BID) | ORAL | 1 refills | Status: DC

## 2022-10-12 NOTE — Telephone Encounter (Signed)
Spoke with pt and advised Dr Caryl Comes has reviewed labs and pt will need to decrease Eliquis from 24m (1 tablet)  to 2.589m(1/2 tablet) daily.  Pt states he just picked up a new Rx of Eliquis [id number]. Will send new Rx for next pt pick up. Pt verbalzies understanding and agrees with current plan.

## 2022-10-26 ENCOUNTER — Other Ambulatory Visit: Payer: Self-pay | Admitting: Internal Medicine

## 2022-10-26 MED ORDER — METOPROLOL SUCCINATE ER 50 MG PO TB24: 50.0000 mg | ORAL_TABLET | Freq: Every day | ORAL | 3 refills | Status: DC

## 2022-10-26 NOTE — Addendum Note (Signed)
Addended by: Carter Kitten D on: 10/26/2022 11:02 AM   Modules accepted: Orders

## 2022-11-07 DIAGNOSIS — C434 Malignant melanoma of scalp and neck: Secondary | ICD-10-CM | POA: Diagnosis not present

## 2022-11-11 ENCOUNTER — Other Ambulatory Visit: Payer: Self-pay | Admitting: Internal Medicine

## 2023-03-01 ENCOUNTER — Encounter: Payer: Medicare HMO | Admitting: Family Medicine

## 2023-03-04 DIAGNOSIS — D485 Neoplasm of uncertain behavior of skin: Secondary | ICD-10-CM | POA: Diagnosis not present

## 2023-03-04 DIAGNOSIS — L570 Actinic keratosis: Secondary | ICD-10-CM | POA: Diagnosis not present

## 2023-03-04 DIAGNOSIS — L738 Other specified follicular disorders: Secondary | ICD-10-CM | POA: Diagnosis not present

## 2023-03-04 DIAGNOSIS — Z85828 Personal history of other malignant neoplasm of skin: Secondary | ICD-10-CM | POA: Diagnosis not present

## 2023-03-04 DIAGNOSIS — Z85820 Personal history of malignant melanoma of skin: Secondary | ICD-10-CM | POA: Diagnosis not present

## 2023-03-04 DIAGNOSIS — L850 Acquired ichthyosis: Secondary | ICD-10-CM | POA: Diagnosis not present

## 2023-03-14 ENCOUNTER — Telehealth: Payer: Self-pay | Admitting: Internal Medicine

## 2023-03-14 ENCOUNTER — Other Ambulatory Visit: Payer: Self-pay

## 2023-03-14 DIAGNOSIS — Z79899 Other long term (current) drug therapy: Secondary | ICD-10-CM

## 2023-03-14 DIAGNOSIS — I4821 Permanent atrial fibrillation: Secondary | ICD-10-CM

## 2023-03-14 MED ORDER — FUROSEMIDE 40 MG PO TABS: ORAL_TABLET | ORAL | 3 refills | Status: DC

## 2023-03-14 NOTE — Telephone Encounter (Signed)
Sent in refill for furosemide 40 mg PO every other day may take an additional dose as needed.  #90 3 refills.  Left a detailed message with information advised to call our office with questions or concerns.

## 2023-03-14 NOTE — Telephone Encounter (Signed)
Spoke to the pt, sent his prescription for Lasix 40 mg to [facility] [address] Pt voiced understanding.

## 2023-03-14 NOTE — Telephone Encounter (Signed)
Pt c/o medication issue:  1. Name of Medication: furosemide (LASIX) 40 MG tablet   2. How are you currently taking this medication (dosage and times per day)? TAKE 1 TABLET(40 MG) BY MOUTH EVERY OTHER DAY. MAY TAKE EXTRA AS NEEDED   3. Are you having a reaction (difficulty breathing--STAT)? No  4. What is your medication issue? Patient requested a refill and the refill was sent to the wrong location. Patient needs a refill [facility] to [facility] [address], [place], [place]

## 2023-03-14 NOTE — Telephone Encounter (Signed)
Pt c/o medication issue:  1. Name of Medication: furosemide (LASIX) 40 MG tablet   2. How are you currently taking this medication (dosage and times per day)?  Patient states he is taking one tablet everyday.    3. Are you having a reaction (difficulty breathing--STAT)?   4. What is your medication issue? Patient states it currently written for one tablet every other day or may take extra as needed. He states he has been taking it everyday.  He needs script changed to everyday so insurance will cover it.   He would like [id number] [place], [place] - [address] [employer]

## 2023-03-24 ENCOUNTER — Other Ambulatory Visit: Payer: Self-pay | Admitting: Family Medicine

## 2023-03-25 DIAGNOSIS — B0239 Other herpes zoster eye disease: Secondary | ICD-10-CM | POA: Diagnosis not present

## 2023-03-25 DIAGNOSIS — Z85820 Personal history of malignant melanoma of skin: Secondary | ICD-10-CM | POA: Diagnosis not present

## 2023-03-25 DIAGNOSIS — B029 Zoster without complications: Secondary | ICD-10-CM | POA: Diagnosis not present

## 2023-04-04 DIAGNOSIS — H40013 Open angle with borderline findings, low risk, bilateral: Secondary | ICD-10-CM | POA: Diagnosis not present

## 2023-04-04 DIAGNOSIS — Z961 Presence of intraocular lens: Secondary | ICD-10-CM | POA: Diagnosis not present

## 2023-04-04 DIAGNOSIS — B0239 Other herpes zoster eye disease: Secondary | ICD-10-CM | POA: Diagnosis not present

## 2023-04-15 DIAGNOSIS — H1820 Unspecified corneal edema: Secondary | ICD-10-CM | POA: Diagnosis not present

## 2023-05-13 DIAGNOSIS — I6523 Occlusion and stenosis of bilateral carotid arteries: Secondary | ICD-10-CM | POA: Diagnosis not present

## 2023-05-13 DIAGNOSIS — R911 Solitary pulmonary nodule: Secondary | ICD-10-CM | POA: Diagnosis not present

## 2023-05-13 DIAGNOSIS — Z08 Encounter for follow-up examination after completed treatment for malignant neoplasm: Secondary | ICD-10-CM | POA: Diagnosis not present

## 2023-05-13 DIAGNOSIS — C434 Malignant melanoma of scalp and neck: Secondary | ICD-10-CM | POA: Diagnosis not present

## 2023-05-13 DIAGNOSIS — I2510 Atherosclerotic heart disease of native coronary artery without angina pectoris: Secondary | ICD-10-CM | POA: Diagnosis not present

## 2023-05-13 DIAGNOSIS — K5730 Diverticulosis of large intestine without perforation or abscess without bleeding: Secondary | ICD-10-CM | POA: Diagnosis not present

## 2023-05-13 DIAGNOSIS — Z85820 Personal history of malignant melanoma of skin: Secondary | ICD-10-CM | POA: Diagnosis not present

## 2023-05-13 DIAGNOSIS — R918 Other nonspecific abnormal finding of lung field: Secondary | ICD-10-CM | POA: Diagnosis not present

## 2023-05-20 DIAGNOSIS — C44729 Squamous cell carcinoma of skin of left lower limb, including hip: Secondary | ICD-10-CM | POA: Diagnosis not present

## 2023-05-20 DIAGNOSIS — D485 Neoplasm of uncertain behavior of skin: Secondary | ICD-10-CM | POA: Diagnosis not present

## 2023-05-20 DIAGNOSIS — Z85820 Personal history of malignant melanoma of skin: Secondary | ICD-10-CM | POA: Diagnosis not present

## 2023-05-30 DIAGNOSIS — Z79899 Other long term (current) drug therapy: Secondary | ICD-10-CM | POA: Diagnosis not present

## 2023-05-30 DIAGNOSIS — Z801 Family history of malignant neoplasm of trachea, bronchus and lung: Secondary | ICD-10-CM | POA: Diagnosis not present

## 2023-05-30 DIAGNOSIS — Z87891 Personal history of nicotine dependence: Secondary | ICD-10-CM | POA: Diagnosis not present

## 2023-05-30 DIAGNOSIS — I10 Essential (primary) hypertension: Secondary | ICD-10-CM | POA: Diagnosis not present

## 2023-05-30 DIAGNOSIS — Z7901 Long term (current) use of anticoagulants: Secondary | ICD-10-CM | POA: Diagnosis not present

## 2023-05-30 DIAGNOSIS — I4891 Unspecified atrial fibrillation: Secondary | ICD-10-CM | POA: Diagnosis not present

## 2023-05-30 DIAGNOSIS — R918 Other nonspecific abnormal finding of lung field: Secondary | ICD-10-CM | POA: Diagnosis not present

## 2023-05-30 DIAGNOSIS — C4339 Malignant melanoma of other parts of face: Secondary | ICD-10-CM | POA: Diagnosis not present

## 2023-05-30 DIAGNOSIS — Z808 Family history of malignant neoplasm of other organs or systems: Secondary | ICD-10-CM | POA: Diagnosis not present

## 2023-05-31 DIAGNOSIS — R911 Solitary pulmonary nodule: Secondary | ICD-10-CM | POA: Diagnosis not present

## 2023-05-31 DIAGNOSIS — R918 Other nonspecific abnormal finding of lung field: Secondary | ICD-10-CM | POA: Diagnosis not present

## 2023-06-06 DIAGNOSIS — D485 Neoplasm of uncertain behavior of skin: Secondary | ICD-10-CM | POA: Diagnosis not present

## 2023-06-06 DIAGNOSIS — C44212 Basal cell carcinoma of skin of right ear and external auricular canal: Secondary | ICD-10-CM | POA: Diagnosis not present

## 2023-06-06 DIAGNOSIS — Z85820 Personal history of malignant melanoma of skin: Secondary | ICD-10-CM | POA: Diagnosis not present

## 2023-06-06 DIAGNOSIS — C44319 Basal cell carcinoma of skin of other parts of face: Secondary | ICD-10-CM | POA: Diagnosis not present

## 2023-06-10 DIAGNOSIS — Z01818 Encounter for other preprocedural examination: Secondary | ICD-10-CM | POA: Diagnosis not present

## 2023-06-10 DIAGNOSIS — I4891 Unspecified atrial fibrillation: Secondary | ICD-10-CM | POA: Diagnosis not present

## 2023-06-14 DIAGNOSIS — I10 Essential (primary) hypertension: Secondary | ICD-10-CM | POA: Diagnosis not present

## 2023-06-14 DIAGNOSIS — C7801 Secondary malignant neoplasm of right lung: Secondary | ICD-10-CM | POA: Diagnosis not present

## 2023-06-14 DIAGNOSIS — I4891 Unspecified atrial fibrillation: Secondary | ICD-10-CM | POA: Diagnosis not present

## 2023-06-14 DIAGNOSIS — Z87891 Personal history of nicotine dependence: Secondary | ICD-10-CM | POA: Diagnosis not present

## 2023-06-14 DIAGNOSIS — Z01818 Encounter for other preprocedural examination: Secondary | ICD-10-CM | POA: Diagnosis not present

## 2023-06-14 DIAGNOSIS — Z9889 Other specified postprocedural states: Secondary | ICD-10-CM | POA: Diagnosis not present

## 2023-06-14 DIAGNOSIS — Z7901 Long term (current) use of anticoagulants: Secondary | ICD-10-CM | POA: Diagnosis not present

## 2023-06-14 DIAGNOSIS — C4339 Malignant melanoma of other parts of face: Secondary | ICD-10-CM | POA: Diagnosis not present

## 2023-06-14 DIAGNOSIS — T797XXA Traumatic subcutaneous emphysema, initial encounter: Secondary | ICD-10-CM | POA: Diagnosis not present

## 2023-06-14 DIAGNOSIS — E871 Hypo-osmolality and hyponatremia: Secondary | ICD-10-CM | POA: Diagnosis not present

## 2023-06-14 DIAGNOSIS — R911 Solitary pulmonary nodule: Secondary | ICD-10-CM | POA: Diagnosis not present

## 2023-06-14 DIAGNOSIS — R918 Other nonspecific abnormal finding of lung field: Secondary | ICD-10-CM | POA: Diagnosis not present

## 2023-06-14 DIAGNOSIS — Z4682 Encounter for fitting and adjustment of non-vascular catheter: Secondary | ICD-10-CM | POA: Diagnosis not present

## 2023-06-14 DIAGNOSIS — R590 Localized enlarged lymph nodes: Secondary | ICD-10-CM | POA: Diagnosis not present

## 2023-06-14 DIAGNOSIS — J9382 Other air leak: Secondary | ICD-10-CM | POA: Diagnosis not present

## 2023-06-14 DIAGNOSIS — Z85820 Personal history of malignant melanoma of skin: Secondary | ICD-10-CM | POA: Diagnosis not present

## 2023-06-14 DIAGNOSIS — J939 Pneumothorax, unspecified: Secondary | ICD-10-CM | POA: Diagnosis not present

## 2023-06-14 DIAGNOSIS — Y838 Other surgical procedures as the cause of abnormal reaction of the patient, or of later complication, without mention of misadventure at the time of the procedure: Secondary | ICD-10-CM | POA: Diagnosis not present

## 2023-06-24 ENCOUNTER — Non-Acute Institutional Stay (SKILLED_NURSING_FACILITY): Payer: Medicare HMO | Admitting: Internal Medicine

## 2023-06-24 ENCOUNTER — Encounter: Payer: Self-pay | Admitting: Internal Medicine

## 2023-06-24 DIAGNOSIS — M6281 Muscle weakness (generalized): Secondary | ICD-10-CM | POA: Diagnosis not present

## 2023-06-24 DIAGNOSIS — Z902 Acquired absence of lung [part of]: Secondary | ICD-10-CM | POA: Diagnosis not present

## 2023-06-24 DIAGNOSIS — R600 Localized edema: Secondary | ICD-10-CM

## 2023-06-24 DIAGNOSIS — I10 Essential (primary) hypertension: Secondary | ICD-10-CM | POA: Diagnosis not present

## 2023-06-24 DIAGNOSIS — R918 Other nonspecific abnormal finding of lung field: Secondary | ICD-10-CM | POA: Diagnosis not present

## 2023-06-24 DIAGNOSIS — I480 Paroxysmal atrial fibrillation: Secondary | ICD-10-CM | POA: Diagnosis not present

## 2023-06-24 DIAGNOSIS — E871 Hypo-osmolality and hyponatremia: Secondary | ICD-10-CM | POA: Diagnosis not present

## 2023-06-24 DIAGNOSIS — R339 Retention of urine, unspecified: Secondary | ICD-10-CM

## 2023-06-24 LAB — BASIC METABOLIC PANEL
BUN: 12 (ref 4–21)
CO2: 26 — AB (ref 13–22)
Chloride: 89 — AB (ref 99–108)
Creatinine: 1 (ref 0.6–1.3)
Glucose: 146
Potassium: 4 meq/L (ref 3.5–5.1)
Sodium: 124 — AB (ref 137–147)

## 2023-06-24 LAB — COMPREHENSIVE METABOLIC PANEL: Calcium: 8.5 — AB (ref 8.7–10.7)

## 2023-06-24 NOTE — Progress Notes (Unsigned)
Provider:   Location:  [facility] Room Number: 159A Place of Service:  SNF [id number]) Provider: Shea Evans  PCP: Ardith Dark, MD Patient Care Team: Ardith Dark, MD as PCP - General (Family Medicine) Duke Salvia, MD as PCP - Electrophysiology (Cardiology) Duke Salvia, MD (Cardiology) Marcine Matar, MD as Attending Physician (Urology) Donzetta Starch, MD as Consulting Physician (Dermatology) Antony Contras, MD as Consulting Physician (Ophthalmology) Hilarie Fredrickson, MD as Consulting Physician (Gastroenterology) Dahlia Byes, [facility] (Inactive) as Pharmacist (Pharmacist) Alejandro Mulling, RN as [facility] Management  Extended Emergency Contact Information Primary Emergency Contact: Fallin,Betsy Address: [address]          [place], [place] [address] [place] of [place] Home Phone: [phone] Relation: Spouse Secondary Emergency Contact: Clayton Lefort [place] of [place] Mobile Phone: [phone] Relation: Daughter  Code Status: Full Code  Goals of Care: Advanced Directive information    06/24/2023   10:25 AM  Advanced Directives  Does Patient Have a Medical Advance Directive? No  Would patient like information on creating a medical advance directive? No - Patient declined      Chief Complaint  Patient presents with   New Admit To SNF    Patient is being seen for new admit to SNF   Immunizations    Patient is due for shingles and covid vaccine    Quality Metric Gaps    Discuss the need for AWV    HPI: Patient is a 82 y.o. male seen today for admission to SNF for therapy and Care  Patient was admitted in the hospital from 10/11-10/18  He has history of stage III (melanoma of L temple s/p Chemo  Was found to have  right upper lobe nodule. Admitted  On10/11 for right video assisted thoracoscopic surgery with right upper lobectomy and right middle lobe pexy.Post op he has Air leak and was  kept in the hospital till the leak closed by itself He also had Urinary Retention needing Foley Catheter  He also Had Hyponatremia and is on high dose of Sodium tabs for Hyponatremia  Patient also has h/o HTN,A Fib on Eliquis,LE edema on lasix and Anemia  He is doing well today Wants Referral to [facility] for BPH symptoms Also Wants to stop taking Sodium Tabs if possible due to Worsening Edema in his legs He is walking with his walker  Before surgery was very independent    Past Medical History:  Diagnosis Date   Anticoagulant long-term use    Atrial fibrillation, permanent (HCC)    CARIOLOGIST-  DR Graciela Husbands   Bladder diverticulum    BPH (benign prostatic hypertrophy)    ED (erectile dysfunction) of organic origin    Elevated LFTs    CHRONIC   Gall stones    Gout    per pt 12-09-2013  gout is stable   Hematuria    History of pancreatitis    10/2012--  resolved    History of TIA (transient ischemic attack)    04/2009--  no residual   Hyperlipemia    Hypertension    Liver disease    Melanoma (HCC)    Personal history of colonic polyps 07/12/2008   TUBULAR ADENOMA   Rosacea    Past Surgical History:  Procedure Laterality Date   CARDIAC CATHETERIZATION  09-19-1998   false positive stress test---  normal cath and preserved lvf   CATARACT EXTRACTION W/ INTRAOCULAR LENS  IMPLANT, BILATERAL Bilateral 2008  CYSTOSCOPY WITH BIOPSY N/A 12/14/2013   Procedure: CYSTOSCOPY WITH BIOPSY;  Surgeon: Marcine Matar, MD;  Location: [facility];  Service: Urology;  Laterality: N/A;   INGUINAL HERNIA REPAIR Left 01-14-2001   LAPAROSCOPIC CHOLECYSTECTOMY  10-21-2008   AND LIVER BX'S   LAPAROSCOPIC INGUINAL HERNIA REPAIR Bilateral 05-22-2009    reports that he has never smoked. He has never used smokeless tobacco. He reports current alcohol use of about 14.0 standard drinks of alcohol per week. He reports that he does not use drugs. Social History   Socioeconomic  History   Marital status: Married    Spouse name: Not on file   Number of children: 2   Years of education: Not on file   Highest education level: Not on file  Occupational History   Occupation: retired    Associate Professor: RETIRED  Tobacco Use   Smoking status: Never   Smokeless tobacco: Never  Vaping Use   Vaping status: Never Used  Substance and Sexual Activity   Alcohol use: Yes    Alcohol/week: 14.0 standard drinks of alcohol    Types: 14 Shots of liquor per week    Comment: 3 oz. per day   Drug use: No   Sexual activity: Yes    Partners: Female  Other Topics Concern   Not on file  Social History Narrative   Not on file   Social Determinants of Health   Financial Resource Strain: Low Risk  (06/20/2023)   Received from [facility]   Overall Financial Resource Strain (CARDIA)    Difficulty of Paying Living Expenses: Not hard at all  Food Insecurity: No Food Insecurity (06/20/2023)   Received from [facility]   Hunger Vital Sign    Worried About Running Out of Food in the Last Year: Never true    Ran Out of Food in the Last Year: Never true  Transportation Needs: No Transportation Needs (06/20/2023)   Received from [facility] - Transportation    Lack of Transportation (Medical): No    Lack of Transportation (Non-Medical): No  Physical Activity: Sufficiently Active (02/16/2022)   Exercise Vital Sign    Days of Exercise per Week: 6 days    Minutes of Exercise per Session: 30 min  Stress: No Stress Concern Present (02/16/2022)   [employer] of Occupational Health - Occupational Stress Questionnaire    Feeling of Stress : Not at all  Social Connections: Socially Integrated (02/16/2022)   Social Connection and Isolation Panel [NHANES]    Frequency of Communication with Friends and Family: Three times a week    Frequency of Social Gatherings with Friends and Family: Twice a week    Attends Religious Services: More than 4 times per year    Active  Member of [employer] or Organizations: Yes    Attends Engineer, structural: More than 4 times per year    Marital Status: Married  Catering manager Violence: Not At Risk (02/16/2022)   Humiliation, Afraid, Rape, and Kick questionnaire    Fear of Current or Ex-Partner: No    Emotionally Abused: No    Physically Abused: No    Sexually Abused: No    Functional Status Survey:    Family History  Problem Relation Age of Onset   Lung cancer Mother    Skin cancer Sister    Colon cancer Neg Hx    Esophageal cancer Neg Hx    Pancreatic cancer Neg Hx    Stomach  cancer Neg Hx    Rectal cancer Neg Hx     Health Maintenance  Topic Date Due   Zoster Vaccines- Shingrix (1 of 2) Never done   Medicare Annual Wellness (AWV)  02/17/2023   COVID-19 Vaccine (5 - 2023-24 season) 05/05/2023   DTaP/Tdap/Td (3 - Td or Tdap) 09/14/2024   Colonoscopy  04/10/2026   Pneumonia Vaccine 48+ Years old  Completed   INFLUENZA VACCINE  Completed   HPV VACCINES  Aged Out   Hepatitis C Screening  Discontinued    No Known Allergies  Outpatient Encounter Medications as of 06/24/2023  Medication Sig   acetaminophen (TYLENOL) 500 MG tablet Take 1,000 mg by mouth every 8 (eight) hours as needed for mild pain (pain score 1-3).   allopurinol (ZYLOPRIM) 300 MG tablet TAKE 1 TABLET BY MOUTH EVERY DAY   amLODipine (NORVASC) 5 MG tablet Take 1 tablet (5 mg total) by mouth daily.   ampicillin (PRINCIPEN) 500 MG capsule Take 500 mg by mouth 2 (two) times daily as needed.   apixaban (ELIQUIS) 2.5 MG TABS tablet Take 1 tablet (2.5 mg total) by mouth 2 (two) times daily.   candesartan-hydrochlorothiazide (ATACAND HCT) 32-12.5 MG tablet Take 1 tablet by mouth daily.   Docusate Sodium (DSS) 100 MG CAPS Take 100 mg by mouth 2 (two) times daily.   ferrous sulfate 325 (65 FE) MG tablet Take 325 mg by mouth daily.   finasteride (PROSCAR) 5 MG tablet Take 5 mg by mouth.   furosemide (LASIX) 40 MG tablet TAKE 1 TABLET(40 MG)  BY MOUTH EVERY OTHER DAY. MAY TAKE EXTRA AS NEEDED   metoprolol succinate (TOPROL-XL) 50 MG 24 hr tablet Take 1 tablet (50 mg total) by mouth daily. Take with or immediately following a meal.   omeprazole (PRILOSEC) 20 MG capsule Take 1 capsule (20 mg total) by mouth daily.   oxyCODONE (OXY IR/ROXICODONE) 5 MG immediate release tablet Take 5 mg by mouth every 4 (four) hours as needed for moderate pain (pain score 4-6).   polyethylene glycol (MIRALAX / GLYCOLAX) 17 g packet Take 17 g by mouth daily.   sodium chloride 1 g tablet Take 2 g by mouth 3 (three) times daily with meals.   tamsulosin (FLOMAX) 0.4 MG CAPS capsule Take 0.4 mg by mouth daily.   No facility-administered encounter medications on file as of 06/24/2023.    Review of Systems  Constitutional:  Negative for activity change, appetite change and unexpected weight change.  HENT: Negative.    Respiratory:  Negative for cough and shortness of breath.   Cardiovascular:  Positive for leg swelling.  Gastrointestinal:  Negative for constipation.  Genitourinary:  Positive for difficulty urinating. Negative for frequency.  Musculoskeletal:  Negative for arthralgias, gait problem and myalgias.  Skin: Negative.  Negative for rash.  Neurological:  Negative for dizziness and weakness.  Psychiatric/Behavioral:  Negative for confusion and sleep disturbance.   All other systems reviewed and are negative.   Vitals:   06/24/23 1015  BP: (!) 147/70  Pulse: 74  Resp: 12  Temp: 98.7 F (37.1 C)  TempSrc: Temporal  SpO2: 99%  Weight: 190 lb (86.2 kg)  Height: 5\' 11"  (1.803 m)   Body mass index is 26.5 kg/m. Physical Exam Vitals reviewed.  Constitutional:      Appearance: Normal appearance.  HENT:     Head: Normocephalic.     Nose: Nose normal.     Mouth/Throat:     Mouth: Mucous membranes are moist.  Pharynx: Oropharynx is clear.  Eyes:     Pupils: Pupils are equal, round, and reactive to light.  Cardiovascular:     Rate  and Rhythm: Normal rate. Rhythm irregular.     Pulses: Normal pulses.     Heart sounds: No murmur heard. Pulmonary:     Effort: Pulmonary effort is normal. No respiratory distress.     Breath sounds: Normal breath sounds. No rales.     Comments: Few rales in Right lower lobe Abdominal:     General: Abdomen is flat. Bowel sounds are normal.     Palpations: Abdomen is soft.  Musculoskeletal:        General: Swelling present.     Cervical back: Neck supple.  Skin:    General: Skin is warm.  Neurological:     General: No focal deficit present.     Mental Status: He is alert and oriented to person, place, and time.  Psychiatric:        Mood and Affect: Mood normal.        Thought Content: Thought content normal.     Labs reviewed: Basic Metabolic Panel: Recent Labs    10/10/22 1015  NA 133*  K 4.4  CL 94*  CO2 22  GLUCOSE 97  BUN 27  CREATININE 1.65*  CALCIUM 9.7   Liver Function Tests: No results for input(s): "AST", "ALT", "ALKPHOS", "BILITOT", "PROT", "ALBUMIN" in the last 8760 hours. No results for input(s): "LIPASE", "AMYLASE" in the last 8760 hours. No results for input(s): "AMMONIA" in the last 8760 hours. CBC: Recent Labs    10/10/22 1015  WBC 5.7  HGB 12.4*  HCT 35.7*  MCV 104*  PLT 245   Cardiac Enzymes: No results for input(s): "CKTOTAL", "CKMB", "CKMBINDEX", "TROPONINI" in the last 8760 hours. BNP: Invalid input(s): "POCBNP" Lab Results  Component Value Date   HGBA1C 5.4 06/06/2020   Lab Results  Component Value Date   TSH 3.130 02/20/2021   Lab Results  Component Value Date   VITAMINB12 411 09/15/2021   Lab Results  Component Value Date   FOLATE 4.9 09/15/2021   Lab Results  Component Value Date   IRON 86 04/16/2018   FERRITIN 12 (L) 02/07/2018    Imaging and Procedures obtained prior to SNF admission: No results found.  Assessment/Plan 1. Hyponatremia Most likely post surgery and he is also on HCTZ On Sodium Tabs  Will Check  Bmp today If Sodium is better will taper him off Tabs  2. Bilateral leg edema On Lasix every day Some worsening due to Being on Sodium tabs  3. Primary hypertension On Atacand and Amlodipine  4. Paroxysmal atrial fibrillation (HCC) On Eliquis and Metoprolol  5. S/P lobectomy of lung Doing well On Oxycodone PRn  6. Urinary retention On Flomax and Proscar Plan for Voiding trial on Thurs Also Made referral to Urology at his request  7 GERD On Prilosec  Family/ staff Communication:   Labs/tests ordered:BMP

## 2023-06-25 ENCOUNTER — Non-Acute Institutional Stay (SKILLED_NURSING_FACILITY): Payer: Self-pay | Admitting: Internal Medicine

## 2023-06-25 ENCOUNTER — Encounter: Payer: Self-pay | Admitting: Internal Medicine

## 2023-06-25 DIAGNOSIS — I480 Paroxysmal atrial fibrillation: Secondary | ICD-10-CM

## 2023-06-25 DIAGNOSIS — I10 Essential (primary) hypertension: Secondary | ICD-10-CM

## 2023-06-25 DIAGNOSIS — R339 Retention of urine, unspecified: Secondary | ICD-10-CM

## 2023-06-25 DIAGNOSIS — Z902 Acquired absence of lung [part of]: Secondary | ICD-10-CM | POA: Diagnosis not present

## 2023-06-25 DIAGNOSIS — E871 Hypo-osmolality and hyponatremia: Secondary | ICD-10-CM

## 2023-06-25 DIAGNOSIS — R918 Other nonspecific abnormal finding of lung field: Secondary | ICD-10-CM | POA: Diagnosis not present

## 2023-06-25 DIAGNOSIS — R600 Localized edema: Secondary | ICD-10-CM

## 2023-06-25 DIAGNOSIS — M6281 Muscle weakness (generalized): Secondary | ICD-10-CM | POA: Diagnosis not present

## 2023-06-25 NOTE — Progress Notes (Signed)
Location:  [facility] Room Number: 159A Place of Service:  SNF [phone]) Provider: Celene Skeen, MD  Patient Care Team: Ardith Dark, MD as PCP - General (Family Medicine) Duke Salvia, MD as PCP - Electrophysiology (Cardiology) Duke Salvia, MD (Cardiology) Marcine Matar, MD as Attending Physician (Urology) Donzetta Starch, MD as Consulting Physician (Dermatology) Antony Contras, MD as Consulting Physician (Ophthalmology) Hilarie Fredrickson, MD as Consulting Physician (Gastroenterology) Dahlia Byes, [facility] (Inactive) as Pharmacist (Pharmacist) Alejandro Mulling, RN as [facility] Management  Extended Emergency Contact Information Primary Emergency Contact: Eline,Betsy Address: [address]          [place], [place] [address] [place] of [place] Home Phone: [phone] Relation: Spouse Secondary Emergency Contact: Clayton L[place] Mobile Phone: [phone] Relation: Daughter  Code Status:  Full COde Goals of care: Advanced Directive information    06/25/2023   10:17 AM  Advanced Directives  Does Patient Have a Medical Advance Directive? No  Would patient like information on creating a medical advance directive? No - Patient declined     Chief Complaint  Patient presents with   Acute Visit    Patient is being seen for Hyponatremia    HPI:  Pt is a 82 y.o. male seen today for an acute visit for Sodium in BMP is 124  Patient is in Rehab in [place] Rechecked BMP shows Sodium is still 124 Patient on high doses of Sodium tabs He is asymptomatic except LE edema   Patient was admitted in the hospital from 10/11-10/18   He has history of stage III (melanoma of L temple s/p Chemo  Was found to have  right upper lobe nodule. Admitted  On10/11 for right video assisted thoracoscopic surgery with right upper lobectomy and right middle lobe pexy.Post op he has Air leak and was  kept in the hospital till the leak closed by itself He also had Urinary Retention needing Foley Catheter   He also Had Hyponatremia and is on high dose of Sodium tabs for Hyponatremia   Patient also has h/o HTN,A Fib on Eliquis,LE edema on lasix and Anemia  Past Medical History:  Diagnosis Date   Anticoagulant long-term use    Atrial fibrillation, permanent (HCC)    CARIOLOGIST-  DR Graciela Husbands   Bladder diverticulum    BPH (benign prostatic hypertrophy)    ED (erectile dysfunction) of organic origin    Elevated LFTs    CHRONIC   Gall stones    Gout    per pt 12-09-2013  gout is stable   Hematuria    History of pancreatitis    10/2012--  resolved    History of TIA (transient ischemic attack)    04/2009--  no residual   Hyperlipemia    Hypertension    Liver disease    Melanoma (HCC)    Personal history of colonic polyps 07/12/2008   TUBULAR ADENOMA   Rosacea    Past Surgical History:  Procedure Laterality Date   CARDIAC CATHETERIZATION  09-19-1998   false positive stress test---  normal cath and preserved lvf   CATARACT EXTRACTION W/ INTRAOCULAR LENS  IMPLANT, BILATERAL Bilateral 2008   CYSTOSCOPY WITH BIOPSY N/A 12/14/2013   Procedure: CYSTOSCOPY WITH BIOPSY;  Surgeon: Marcine Matar, MD;  [facility];  Service: Urology;  Laterality: N/A;   INGUINAL HERNIA REPAIR Left 01-14-2001   LAPAROSCOPIC CHOLECYSTECTOMY  10-21-2008   AND LIVER  BX'S   LAPAROSCOPIC INGUINAL HERNIA REPAIR Bilateral 05-22-2009    No Known Allergies  Outpatient Encounter Medications as of 06/25/2023  Medication Sig   acetaminophen (TYLENOL) 500 MG tablet Take 1,000 mg by mouth every 8 (eight) hours as needed for mild pain (pain score 1-3).   allopurinol (ZYLOPRIM) 300 MG tablet TAKE 1 TABLET BY MOUTH EVERY DAY   amLODipine (NORVASC) 5 MG tablet Take 1 tablet (5 mg total) by mouth daily.   ampicillin (PRINCIPEN) 500 MG capsule Take 500 mg by mouth 2 (two) times daily as needed.    apixaban (ELIQUIS) 2.5 MG TABS tablet Take 1 tablet (2.5 mg total) by mouth 2 (two) times daily.   candesartan-hydrochlorothiazide (ATACAND HCT) 32-12.5 MG tablet Take 1 tablet by mouth daily.   Docusate Sodium (DSS) 100 MG CAPS Take 100 mg by mouth 2 (two) times daily.   ferrous sulfate 325 (65 FE) MG tablet Take 325 mg by mouth daily.   finasteride (PROSCAR) 5 MG tablet Take 5 mg by mouth.   furosemide (LASIX) 40 MG tablet Take 40 mg by mouth daily.   metoprolol succinate (TOPROL-XL) 50 MG 24 hr tablet Take 1 tablet (50 mg total) by mouth daily. Take with or immediately following a meal.   omeprazole (PRILOSEC) 20 MG capsule Take 1 capsule (20 mg total) by mouth daily.   oxyCODONE (OXY IR/ROXICODONE) 5 MG immediate release tablet Take 5 mg by mouth every 4 (four) hours as needed for moderate pain (pain score 4-6).   polyethylene glycol (MIRALAX / GLYCOLAX) 17 g packet Take 17 g by mouth daily.   sodium chloride 1 g tablet Take 2 g by mouth 3 (three) times daily with meals.   tamsulosin (FLOMAX) 0.4 MG CAPS capsule Take 0.4 mg by mouth daily.   No facility-administered encounter medications on file as of 06/25/2023.    Review of Systems  Constitutional:  Negative for activity change, appetite change and unexpected weight change.  HENT: Negative.    Respiratory:  Negative for cough and shortness of breath.   Cardiovascular:  Positive for leg swelling.  Gastrointestinal:  Negative for constipation.  Genitourinary:  Negative for frequency.  Musculoskeletal:  Negative for arthralgias, gait problem and myalgias.  Skin: Negative.  Negative for rash.  Neurological:  Negative for dizziness and weakness.  Psychiatric/Behavioral:  Negative for confusion and sleep disturbance.   All other systems reviewed and are negative.   Immunization History  Administered Date(s) Administered   Fluad Quad(high Dose 65+) 05/19/2019, 06/06/2020, 07/04/2022   Influenza Split 07/09/2012, 07/08/2013,  07/19/2016   Influenza Whole 08/09/2009   Influenza, High Dose Seasonal PF 06/04/2018, 06/06/2023   Influenza-Unspecified 07/05/2015, 05/29/2021   PFIZER(Purple Top)SARS-COV-2 Vaccination 09/23/2019, 10/14/2019, 06/05/2020, 05/18/2021   Pneumococcal Conjugate-13 09/14/2014   Pneumococcal Polysaccharide-23 07/09/2012   Td 09/03/2004   Tdap 09/14/2014   Pertinent  Health Maintenance Due  Topic Date Due   Colonoscopy  04/10/2026   INFLUENZA VACCINE  Completed      09/27/2021   10:24 PM 09/30/2021    6:44 PM 11/30/2021    1:13 PM 12/20/2021    9:42 AM 02/16/2022   10:40 AM  Fall Risk  Falls in the past year?   0 0 0  Was there an injury with Fall?   0 0 0  Fall Risk Category Calculator   0 0 0  Fall Risk Category (Retired)   Low Low Low  (RETIRED) Patient Fall Risk Level Low fall risk Low fall risk Low fall  risk Low fall risk   Patient at Risk for Falls Due to   No Fall Risks  Impaired vision  Fall risk Follow up   Falls evaluation completed  Falls prevention discussed   Functional Status Survey:    Vitals:   06/25/23 0958  BP: 139/70  Pulse: 99  Resp: 20  Temp: 98.2 F (36.8 C)  TempSrc: Temporal  SpO2: 99%  Height: 5\' 11"  (1.803 m)   Body mass index is 26.5 kg/m. Physical Exam Vitals reviewed.  Constitutional:      Appearance: Normal appearance.  HENT:     Head: Normocephalic.     Nose: Nose normal.     Mouth/Throat:     Mouth: Mucous membranes are moist.     Pharynx: Oropharynx is clear.  Eyes:     Pupils: Pupils are equal, round, and reactive to light.  Cardiovascular:     Rate and Rhythm: Normal rate and regular rhythm.     Pulses: Normal pulses.     Heart sounds: No murmur heard. Pulmonary:     Effort: Pulmonary effort is normal. No respiratory distress.     Breath sounds: Rales present.  Abdominal:     General: Abdomen is flat. Bowel sounds are normal.     Palpations: Abdomen is soft.  Musculoskeletal:        General: Swelling present.     Cervical  back: Neck supple.  Skin:    General: Skin is warm.  Neurological:     General: No focal deficit present.     Mental Status: He is alert and oriented to person, place, and time.  Psychiatric:        Mood and Affect: Mood normal.        Thought Content: Thought content normal.     Labs reviewed: Recent Labs    10/10/22 1015  NA 133*  K 4.4  CL 94*  CO2 22  GLUCOSE 97  BUN 27  CREATININE 1.65*  CALCIUM 9.7   No results for input(s): "AST", "ALT", "ALKPHOS", "BILITOT", "PROT", "ALBUMIN" in the last 8760 hours. Recent Labs    10/10/22 1015  WBC 5.7  HGB 12.4*  HCT 35.7*  MCV 104*  PLT 245   Lab Results  Component Value Date   TSH 3.130 02/20/2021   Lab Results  Component Value Date   HGBA1C 5.4 06/06/2020   Lab Results  Component Value Date   CHOL 180 06/06/2020   HDL 60 06/06/2020   LDLCALC 99 06/06/2020   LDLDIRECT 145.2 07/22/2013   TRIG 113 06/06/2020   CHOLHDL 3.0 06/06/2020    Significant Diagnostic Results in last 30 days:  No results found.  Assessment/Plan 1. Hyponatremia Sodium still Low On High doses of Sodium tabs Will discontinue his hydrochlorothiazide Start on Valsartan 80 mg every day Repeat BMP on Fri  2. Primary hypertension Will start him on valsartan  Discontinue Atacand  3. Bilateral leg edema On Lasix  4. Paroxysmal atrial fibrillation (HCC) Eliquis and Metoprolol  5. S/P lobectomy of lung Doing well  6. Urinary retention Has Foley Also Appointment with urology    Family/ staff Communication:   Labs/tests ordered:  BMP on Fri

## 2023-06-26 DIAGNOSIS — R918 Other nonspecific abnormal finding of lung field: Secondary | ICD-10-CM | POA: Diagnosis not present

## 2023-06-26 DIAGNOSIS — M6281 Muscle weakness (generalized): Secondary | ICD-10-CM | POA: Diagnosis not present

## 2023-06-26 DIAGNOSIS — N323 Diverticulum of bladder: Secondary | ICD-10-CM | POA: Diagnosis not present

## 2023-06-26 DIAGNOSIS — R338 Other retention of urine: Secondary | ICD-10-CM | POA: Diagnosis not present

## 2023-06-26 DIAGNOSIS — N401 Enlarged prostate with lower urinary tract symptoms: Secondary | ICD-10-CM | POA: Diagnosis not present

## 2023-06-26 DIAGNOSIS — Z902 Acquired absence of lung [part of]: Secondary | ICD-10-CM | POA: Diagnosis not present

## 2023-06-28 ENCOUNTER — Non-Acute Institutional Stay (SKILLED_NURSING_FACILITY): Payer: Medicare HMO | Admitting: Adult Health

## 2023-06-28 ENCOUNTER — Encounter: Payer: Self-pay | Admitting: Adult Health

## 2023-06-28 DIAGNOSIS — Z902 Acquired absence of lung [part of]: Secondary | ICD-10-CM | POA: Diagnosis not present

## 2023-06-28 DIAGNOSIS — I4821 Permanent atrial fibrillation: Secondary | ICD-10-CM

## 2023-06-28 DIAGNOSIS — R911 Solitary pulmonary nodule: Secondary | ICD-10-CM

## 2023-06-28 DIAGNOSIS — I10 Essential (primary) hypertension: Secondary | ICD-10-CM | POA: Diagnosis not present

## 2023-06-28 DIAGNOSIS — E871 Hypo-osmolality and hyponatremia: Secondary | ICD-10-CM

## 2023-06-28 DIAGNOSIS — R339 Retention of urine, unspecified: Secondary | ICD-10-CM | POA: Diagnosis not present

## 2023-06-28 DIAGNOSIS — C439 Malignant melanoma of skin, unspecified: Secondary | ICD-10-CM | POA: Diagnosis not present

## 2023-06-28 LAB — COMPREHENSIVE METABOLIC PANEL
Calcium: 9 (ref 8.7–10.7)
eGFR: 89

## 2023-06-28 LAB — BASIC METABOLIC PANEL
BUN: 11 (ref 4–21)
CO2: 24 — AB (ref 13–22)
Chloride: 91 — AB (ref 99–108)
Creatinine: 0.8 (ref 0.6–1.3)
Glucose: 103
Potassium: 4.1 meq/L (ref 3.5–5.1)
Sodium: 126 — AB (ref 137–147)

## 2023-06-28 MED ORDER — MUPIROCIN 2 % EX OINT: 1.0000 | TOPICAL_OINTMENT | Freq: Two times a day (BID) | CUTANEOUS | 0 refills | Status: DC

## 2023-06-28 MED ORDER — VALSARTAN 80 MG PO TABS: 80.0000 mg | ORAL_TABLET | Freq: Every day | ORAL | 1 refills | Status: DC

## 2023-06-28 NOTE — Progress Notes (Signed)
Location:  Medical illustrator of Service:  SNF ([id number])  Provider:  Peggye Ley, ANP [facility] [phone]   PCP: Ardith Dark, MD Patient Care Team: Ardith Dark, MD as PCP - General (Family Medicine) Duke Salvia, MD as PCP - Electrophysiology (Cardiology) Duke Salvia, MD (Cardiology) Marcine Matar, MD as Attending Physician (Urology) Donzetta Starch, MD as Consulting Physician (Dermatology) Antony Contras, MD as Consulting Physician (Ophthalmology) Hilarie Fredrickson, MD as Consulting Physician (Gastroenterology) Dahlia Byes, [facility] (Inactive) as Pharmacist (Pharmacist) Alejandro Mulling, RN as [facility] Management  Extended Emergency Contact Information Primary Emergency Contact: Harrell,Betsy Address: [address]          [place], [place] [address] [place] of [place] Home Phone: [phone] Relation: Spouse Secondary Emergency Contact: Clayton [place] of [place] Mobile Phone: [phone] Relation: Daughter  Code Status: Full Goals of care:  Advanced Directive information    06/25/2023   10:17 AM  Advanced Directives  Does Patient Have a Medical Advance Directive? No  Would patient like information on creating a medical advance directive? No - Patient declined     No Known Allergies  Chief Complaint  Patient presents with   Acute Visit   Discharge Note    HPI:  82 y.o. male seen for discharge from [facility].   PMH significant for melanoma, afib, cirrhosis, HTN, BPH, gout,  HLD,  [place] Admitted to rehab after procedure: 06/14/23 he underwent a right video assisted thoracoscopic surgery with right upper lobectomy and right middle lobe pexy at [facility].  Discharged from the hospital on 06/21/23 with foley He has followed up with urology and the foley was removed and now he is self cathing  He is ambulating independently with a walker. No urinary symptoms,  eating and drinking well.   During his stay his sodium was 124 06/24/23 which was unchanged from hospital values.  Hydrochlorothiazide was stopped which was a combo drug called Atacand and Diovan started. His bp has been ok. He does have edema in his legs. Taking lasix. Repeat BMP drawn 06/28/23.        Past Medical History:  Diagnosis Date   Anticoagulant long-term use    Atrial fibrillation, permanent (HCC)    CARIOLOGIST-  DR Graciela Husbands   Bladder diverticulum    BPH (benign prostatic hypertrophy)    ED (erectile dysfunction) of organic origin    Elevated LFTs    CHRONIC   Gall stones    Gout    per pt 12-09-2013  gout is stable   Hematuria    History of pancreatitis    10/2012--  resolved    History of TIA (transient ischemic attack)    04/2009--  no residual   Hyperlipemia    Hypertension    Liver disease    Melanoma (HCC)    Personal history of colonic polyps 07/12/2008   TUBULAR ADENOMA   Rosacea     Past Surgical History:  Procedure Laterality Date   CARDIAC CATHETERIZATION  09-19-1998   false positive stress test---  normal cath and preserved lvf   CATARACT EXTRACTION W/ INTRAOCULAR LENS  IMPLANT, BILATERAL Bilateral 2008   CYSTOSCOPY WITH BIOPSY N/A 12/14/2013   Procedure: CYSTOSCOPY WITH BIOPSY;  Surgeon: Marcine Matar, MD;  Location: [facility];  Service: Urology;  Laterality: N/A;   INGUINAL HERNIA REPAIR Left 01-14-2001   LAPAROSCOPIC CHOLECYSTECTOMY  10-21-2008   AND  LIVER BX'S   LAPAROSCOPIC INGUINAL HERNIA REPAIR Bilateral 05-22-2009      reports that he has never smoked. He has never used smokeless tobacco. He reports current alcohol use of about 14.0 standard drinks of alcohol per week. He reports that he does not use drugs. Social History   Socioeconomic History   Marital status: Married    Spouse name: Not on file   Number of children: 2   Years of education: Not on file   Highest education level: Not on file  Occupational  History   Occupation: retired    Associate Professor: RETIRED  Tobacco Use   Smoking status: Never   Smokeless tobacco: Never  Vaping Use   Vaping status: Never Used  Substance and Sexual Activity   Alcohol use: Yes    Alcohol/week: 14.0 standard drinks of alcohol    Types: 14 Shots of liquor per week    Comment: 3 oz. per day   Drug use: No   Sexual activity: Yes    Partners: Female  Other Topics Concern   Not on file  Social History Narrative   Not on file   Social Determinants of Health   Financial Resource Strain: Low Risk  (06/20/2023)   Received from [facility]   Overall Financial Resource Strain (CARDIA)    Difficulty of Paying Living Expenses: Not hard at all  Food Insecurity: No Food Insecurity (06/20/2023)   Received from [facility]   Hunger Vital Sign    Worried About Running Out of Food in the Last Year: Never true    Ran Out of Food in the Last Year: Never true  Transportation Needs: No Transportation Needs (06/20/2023)   Received from [facility] - Transportation    Lack of Transportation (Medical): No    Lack of Transportation (Non-Medical): No  Physical Activity: Sufficiently Active (02/16/2022)   Exercise Vital Sign    Days of Exercise per Week: 6 days    Minutes of Exercise per Session: 30 min  Stress: No Stress Concern Present (02/16/2022)   [employer] of Occupational Health - Occupational Stress Questionnaire    Feeling of Stress : Not at all  Social Connections: Socially Integrated (02/16/2022)   Social Connection and Isolation Panel [NHANES]    Frequency of Communication with Friends and Family: Three times a week    Frequency of Social Gatherings with Friends and Family: Twice a week    Attends Religious Services: More than 4 times per year    Active Member of [employer] or Organizations: Yes    Attends Engineer, structural: More than 4 times per year    Marital Status: Married  Catering manager Violence: Not At Risk  (02/16/2022)   Humiliation, Afraid, Rape, and Kick questionnaire    Fear of Current or Ex-Partner: No    Emotionally Abused: No    Physically Abused: No    Sexually Abused: No   Functional Status Survey:    No Known Allergies  Pertinent  Health Maintenance Due  Topic Date Due   Colonoscopy  04/10/2026   INFLUENZA VACCINE  Completed    Medications: Outpatient Encounter Medications as of 06/28/2023  Medication Sig   acetaminophen (TYLENOL) 500 MG tablet Take 1,000 mg by mouth every 8 (eight) hours as needed for mild pain (pain score 1-3).   allopurinol (ZYLOPRIM) 300 MG tablet TAKE 1 TABLET BY MOUTH EVERY DAY   amLODipine (NORVASC) 5 MG tablet Take 1 tablet (5 mg  total) by mouth daily.   ampicillin (PRINCIPEN) 500 MG capsule Take 500 mg by mouth 2 (two) times daily as needed.   apixaban (ELIQUIS) 2.5 MG TABS tablet Take 1 tablet (2.5 mg total) by mouth 2 (two) times daily.   Docusate Sodium (DSS) 100 MG CAPS Take 100 mg by mouth 2 (two) times daily.   ferrous sulfate 325 (65 FE) MG tablet Take 325 mg by mouth daily.   finasteride (PROSCAR) 5 MG tablet Take 5 mg by mouth.   furosemide (LASIX) 40 MG tablet Take 40 mg by mouth daily.   metoprolol succinate (TOPROL-XL) 50 MG 24 hr tablet Take 1 tablet (50 mg total) by mouth daily. Take with or immediately following a meal.   omeprazole (PRILOSEC) 20 MG capsule Take 1 capsule (20 mg total) by mouth daily.   polyethylene glycol (MIRALAX / GLYCOLAX) 17 g packet Take 17 g by mouth daily.   sodium chloride 1 g tablet Take 2 g by mouth 3 (three) times daily with meals.   tamsulosin (FLOMAX) 0.4 MG CAPS capsule Take 0.4 mg by mouth daily.   valsartan (DIOVAN) 80 MG tablet Take 80 mg by mouth daily.   [DISCONTINUED] oxyCODONE (OXY IR/ROXICODONE) 5 MG immediate release tablet Take 5 mg by mouth every 4 (four) hours as needed for moderate pain (pain score 4-6).   No facility-administered encounter medications on file as of 06/28/2023.     Review of Systems  Constitutional:  Negative for activity change, appetite change, chills, diaphoresis, fatigue, fever and unexpected weight change.  Respiratory:  Negative for cough, shortness of breath, wheezing and stridor.   Cardiovascular:  Negative for chest pain, palpitations and leg swelling.  Gastrointestinal:  Negative for abdominal distention, abdominal pain, constipation and diarrhea.  Genitourinary:  Positive for difficulty urinating (self cath). Negative for dysuria, flank pain, hematuria and penile discharge.  Musculoskeletal:  Positive for gait problem (using walker.). Negative for arthralgias, back pain, joint swelling and myalgias.  Neurological:  Negative for dizziness, seizures, syncope, facial asymmetry, speech difficulty, weakness and headaches.  Hematological:  Negative for adenopathy. Does not bruise/bleed easily.  Psychiatric/Behavioral:  Negative for agitation, behavioral problems and confusion.     Vitals:   06/28/23 1049  BP: 133/73  Pulse: 77  Resp: 12  Temp: 98.3 F (36.8 C)  SpO2: 98%   There is no height or weight on file to calculate BMI. Physical Exam Vitals and nursing note reviewed.  Constitutional:      General: He is not in acute distress.    Appearance: He is not diaphoretic.  HENT:     Head: Normocephalic and atraumatic.  Neck:     Thyroid: No thyromegaly.     Vascular: No JVD.     Trachea: No tracheal deviation.  Cardiovascular:     Rate and Rhythm: Normal rate. Rhythm irregular.     Heart sounds: No murmur heard. Pulmonary:     Effort: Pulmonary effort is normal. No respiratory distress.     Breath sounds: Normal breath sounds. No wheezing.     Comments: Slight decrease right base Abdominal:     General: Bowel sounds are normal. There is no distension.     Palpations: Abdomen is soft.     Tenderness: There is no abdominal tenderness.  Musculoskeletal:     Cervical back: Normal range of motion and neck supple.     Comments:  BLE edema +2  Lymphadenopathy:     Cervical: No cervical adenopathy.  Skin:  General: Skin is warm and dry.     Comments: Right chest wall incision site with erythema and mild swelling. Not tender. Small amt of yellow and brown drainage is noted. Slight dehiscence of the wound is noted.   Neurological:     Mental Status: He is alert and oriented to person, place, and time.     Cranial Nerves: No cranial nerve deficit.     Labs reviewed: Basic Metabolic Panel: Recent Labs    10/10/22 1015 06/24/23 0000  NA 133* 124*  K 4.4 4.0  CL 94* 89*  CO2 22 26*  GLUCOSE 97  --   BUN 27 12  CREATININE 1.65* 1.0  CALCIUM 9.7 8.5*   Liver Function Tests: No results for input(s): "AST", "ALT", "ALKPHOS", "BILITOT", "PROT", "ALBUMIN" in the last 8760 hours. No results for input(s): "LIPASE", "AMYLASE" in the last 8760 hours. No results for input(s): "AMMONIA" in the last 8760 hours. CBC: Recent Labs    10/10/22 1015  WBC 5.7  HGB 12.4*  HCT 35.7*  MCV 104*  PLT 245   Cardiac Enzymes: No results for input(s): "CKTOTAL", "CKMB", "CKMBINDEX", "TROPONINI" in the last 8760 hours. BNP: Invalid input(s): "POCBNP" CBG: No results for input(s): "GLUCAP" in the last 8760 hours.  Procedures and Imaging Studies During Stay: No results found.  Assessment/Plan:   1. Pulmonary nodule RUL  Led to thoracoscopy  2. S/P lobectomy of lung Site has some redness and swelling. Also slight wound dehiscence I have ordered bactroban and instructed the nurse and patient to call the surgeon today.  Ok to discharge with follow up closely  3. Malignant melanoma, unspecified site [facility]) S/p 6 cycles of TVEC and WLE  Followed at [facility]  4. Primary hypertension Controlled on Diovan Off atacand due to low sodium  5. Hyponatremia Currently on sodium tablets high dose with edema Hydrochlorothiazide discontinued repeat BMP pending He is asymptomatic.  Recommend compression hose for edema May need  lasix increase if worsening edema Follow up with PCP regarding low sodium, BP, edema.   6. Urinary retention Has demonstrated self cath and doing well No urinary symptoms Went over signs of a UTI Will f/u with urology   7. Permanent atrial fibrillation (HCC) Follows with Dr Graciela Husbands Rate controlled On eliquis for CVA risk reduction.     Patient is being discharged with the following home health services:  NA  Patient is being discharged with the following durable medical equipment:  NA  Patient has been advised to f/u with their PCP in 1-2 weeks to for a transitions of care visit.  Social services at their facility was responsible for arranging this appointment.  Pt was provided with adequate prescriptions of noncontrolled medications to reach the scheduled appointment .  For controlled substances, a limited supply was provided as appropriate for the individual patient.  If the pt normally receives these medications from a pain clinic or has a contract with another physician, these medications should be received from that clinic or physician only).    Future labs/tests needed:  CBC BMP   Discharge review, assessment, plan, and coordination took >30 min

## 2023-07-01 DIAGNOSIS — Z9889 Other specified postprocedural states: Secondary | ICD-10-CM | POA: Diagnosis not present

## 2023-07-01 DIAGNOSIS — R911 Solitary pulmonary nodule: Secondary | ICD-10-CM | POA: Diagnosis not present

## 2023-07-01 DIAGNOSIS — J939 Pneumothorax, unspecified: Secondary | ICD-10-CM | POA: Diagnosis not present

## 2023-07-01 DIAGNOSIS — C4339 Malignant melanoma of other parts of face: Secondary | ICD-10-CM | POA: Diagnosis not present

## 2023-07-03 DIAGNOSIS — R918 Other nonspecific abnormal finding of lung field: Secondary | ICD-10-CM | POA: Diagnosis not present

## 2023-07-03 DIAGNOSIS — M6281 Muscle weakness (generalized): Secondary | ICD-10-CM | POA: Diagnosis not present

## 2023-07-03 DIAGNOSIS — Z902 Acquired absence of lung [part of]: Secondary | ICD-10-CM | POA: Diagnosis not present

## 2023-07-04 ENCOUNTER — Encounter: Payer: Self-pay | Admitting: Family Medicine

## 2023-07-04 ENCOUNTER — Ambulatory Visit (INDEPENDENT_AMBULATORY_CARE_PROVIDER_SITE_OTHER): Payer: Medicare HMO | Admitting: Family Medicine

## 2023-07-04 VITALS — BP 136/75 | HR 87 | Temp 97.8°F | Ht 71.0 in | Wt 180.8 lb

## 2023-07-04 DIAGNOSIS — E871 Hypo-osmolality and hyponatremia: Secondary | ICD-10-CM | POA: Insufficient documentation

## 2023-07-04 DIAGNOSIS — I480 Paroxysmal atrial fibrillation: Secondary | ICD-10-CM | POA: Diagnosis not present

## 2023-07-04 DIAGNOSIS — C439 Malignant melanoma of skin, unspecified: Secondary | ICD-10-CM

## 2023-07-04 DIAGNOSIS — I10 Essential (primary) hypertension: Secondary | ICD-10-CM | POA: Diagnosis not present

## 2023-07-04 DIAGNOSIS — R948 Abnormal results of function studies of other organs and systems: Secondary | ICD-10-CM | POA: Diagnosis not present

## 2023-07-04 DIAGNOSIS — N400 Enlarged prostate without lower urinary tract symptoms: Secondary | ICD-10-CM | POA: Diagnosis not present

## 2023-07-04 DIAGNOSIS — C434 Malignant melanoma of scalp and neck: Secondary | ICD-10-CM | POA: Diagnosis not present

## 2023-07-04 DIAGNOSIS — G4700 Insomnia, unspecified: Secondary | ICD-10-CM | POA: Diagnosis not present

## 2023-07-04 DIAGNOSIS — R600 Localized edema: Secondary | ICD-10-CM | POA: Insufficient documentation

## 2023-07-04 DIAGNOSIS — Z85820 Personal history of malignant melanoma of skin: Secondary | ICD-10-CM | POA: Diagnosis not present

## 2023-07-04 DIAGNOSIS — D485 Neoplasm of uncertain behavior of skin: Secondary | ICD-10-CM | POA: Diagnosis not present

## 2023-07-04 DIAGNOSIS — C7800 Secondary malignant neoplasm of unspecified lung: Secondary | ICD-10-CM | POA: Diagnosis not present

## 2023-07-04 DIAGNOSIS — C4402 Squamous cell carcinoma of skin of lip: Secondary | ICD-10-CM | POA: Diagnosis not present

## 2023-07-04 MED ORDER — MELATONIN 10 MG PO CAPS: ORAL_CAPSULE | ORAL | 5 refills | Status: DC

## 2023-07-04 MED ORDER — TAMSULOSIN HCL 0.4 MG PO CAPS: 0.4000 mg | ORAL_CAPSULE | Freq: Every day | ORAL | 0 refills | Status: AC

## 2023-07-04 NOTE — Patient Instructions (Addendum)
It was very nice to see you today!  Please continue with the Flomax until you see urology.  Please try taking 10 mg melatonin at night.  Let me know in a few weeks how this is working for you.  We probably should stop all 3 of the Lasix, sodium chloride, and amlodipine.  Can discuss this with your cardiologist.  I will see back in 6 months for a physical.  Come back sooner if needed.  Return in about 6 months (around 01/01/2024) for Annual Physical.   Take care, Dr Jimmey Ralph  PLEASE NOTE:  If you had any lab tests, please let us know if you have not heard back within a few days. You may see your results on mychart before we have a chance to review them but we will give you a call once they are reviewed by [place].   If we ordered any referrals today, please let us know if you have not heard from their office within the next week.   If you had any urgent prescriptions sent in today, please check with the pharmacy within an hour of our visit to make sure the prescription was transmitted appropriately.   Please try these tips to maintain a healthy lifestyle:  Eat at least 3 REAL meals and 1-2 snacks per day.  Aim for no more than 5 hours between eating.  If you eat breakfast, please do so within one hour of getting up.   Each meal should contain half fruits/vegetables, one quarter protein, and one quarter carbs (no bigger than a computer mouse)  Cut down on sweet beverages. This includes juice, soda, and sweet tea.   Drink at least 1 glass of water with each meal and aim for at least 8 glasses per day  Exercise at least 150 minutes every week.

## 2023-07-04 NOTE — Assessment & Plan Note (Signed)
Multifactorial.  Currently on Lasix which is likely causing some hyponatremia issues.  Also on amlodipine which is contributing.  Will defer further management to cardiology as above.

## 2023-07-04 NOTE — Assessment & Plan Note (Signed)
Improving on most recent metabolic panel.  Likely does not need to be on both Lasix and sodium chloride tablets however he can discuss this with cardiology as above.

## 2023-07-04 NOTE — Assessment & Plan Note (Signed)
Recently underwent surgical resection of right upper lung lobe which was consistent with malignant melanoma.  He has recovered from the surgery.  He will follow-up with oncology as previously planned for further management.

## 2023-07-04 NOTE — Progress Notes (Signed)
Nicholas Bird is a 82 y.o. male who presents today for an office visit.  Assessment/Plan:  Chronic Problems Addressed Today: Malignant melanoma Recently underwent surgical resection of right upper lung lobe which was consistent with malignant melanoma.  He has recovered from the surgery.  He will follow-up with oncology as previously planned for further management.   BPH (benign prostatic hyperplasia) Unfortunately his postoperative course was complicated by urinary retention.  He is following with urology for this and will be seeing them soon.  He is on finasteride 5 mg daily and was recently started on Flomax 0.4 mg daily.  We will refill his Flomax today.  He will continue intermittent catheterization until he can see his urology soon.  HTN (hypertension) Follows with cardiology for this.  He is currently on valsartan 80 mg daily, amlodipine 5 mg daily, and metoprolol succinate 50 mg daily.  His blood pressure is well-controlled today however he has having some issues with both lower extremity edema as well as hyponatremia.  His hyponatremia is improving with the sodium chloride tablets that were starting in the nursing facility however he has had worsening leg edema as well.   Discussed with patient that his amlodipine, Lasix, and sodium chloride tablets are likely causing counterproductive effects and that it would be reasonable for [place] to stop all 3 and then subsequently monitor his sodium and blood pressure afterwards.  He will discuss this soon with his cardiologist.  Hyponatremia Improving on most recent metabolic panel.  Likely does not need to be on both Lasix and sodium chloride tablets however he can discuss this with cardiology as above.  Leg edema Multifactorial.  Currently on Lasix which is likely causing some hyponatremia issues.  Also on amlodipine which is contributing.  Will defer further management to cardiology as above.  Insomnia Discussed with patient and may take  several weeks before his circadian rhythm returns to normal after his recent hospitalization and rehab stay.  He can try melatonin nightly.  Would avoid any prescription medications at this point due to anticholinergic properties in light of his recent urinary retention issues.  Atrial fibrillation (HCC) Rate controlled on metoprolol 50 mg daily and anticoagulated on Eliquis 5 mg daily.    Subjective:  HPI:  See Assessment / plan for status of chronic conditions. Patient is here for follow up.  We last saw him about a year and a half ago.  Since her last visit he has been following with oncology for melanoma of the scalp.  As art of this, he gets routine PET scans. His most recent scan showed a new pulmonary lesion.  He underwent partial lobectomy for this.  Biopsy was consistent with malignant melanoma.  His postoperative course was complicated by postoperative pneumothorax.  This was treated with a thoracotomy and chest tube.  Chest tube was removed 3 days ago.  He was in rehab for about a week or so however has been home for the last 6 days.  Since his hospitalization he has had issues with urinary retention.  He was started on Flomax during his hospitalization and has been continue with this.  He is now intermittent catheterizing which seems to be working well.  He does have follow-up with urology planned.  He has a known history of BPH and is on finasteride for this.  He has also had some issues with insomnia.  This has been much worse the last few weeks since all of his medical issues.  He was given  melatonin in a rehab facility which did seem to work well however he is interested in a discussion of alternative options.  He has also been having ongoing issues with leg edema.  He was started on Lasix a few months ago due to edema secondary to his amlodipine.  He was stable on this for a while however while in the nursing facility he was found to be hyponatremic and was started on sodium chloride.   Since then he has noticed worsening edema in his left leg.        Objective:  Physical Exam: BP 136/75   Pulse 87   Temp 97.8 F (36.6 C) (Temporal)   Ht 5\' 11"  (1.803 m)   Wt 180 lb 12.8 oz (82 kg)   SpO2 100%   BMI 25.22 kg/m   Gen: No acute distress, resting comfortably CV: Irregular with no murmurs appreciated Pulm: Normal work of breathing, clear to auscultation bilaterally with no crackles, wheezes, or rhonchi MUSCULOSKELETAL: 2+ pitting edema to the left leg.  Varicosities noted. Neuro: Grossly normal, moves all extremities Psych: Normal affect and thought content  Time Spent: 45 minutes of total time was spent on the date of the encounter performing the following actions: chart review prior to seeing the patient including recent hospitalization, obtaining history, performing a medically necessary exam, counseling on the treatment plan, placing orders, and documenting in our EHR.        Nicholas Degree. Jimmey Ralph, MD 07/04/2023 10:41 AM

## 2023-07-04 NOTE — Assessment & Plan Note (Signed)
Follows with cardiology for this.  He is currently on valsartan 80 mg daily, amlodipine 5 mg daily, and metoprolol succinate 50 mg daily.  His blood pressure is well-controlled today however he has having some issues with both lower extremity edema as well as hyponatremia.  His hyponatremia is improving with the sodium chloride tablets that were starting in the nursing facility however he has had worsening leg edema as well.   Discussed with patient that his amlodipine, Lasix, and sodium chloride tablets are likely causing counterproductive effects and that it would be reasonable for [place] to stop all 3 and then subsequently monitor his sodium and blood pressure afterwards.  He will discuss this soon with his cardiologist.

## 2023-07-04 NOTE — Assessment & Plan Note (Signed)
Discussed with patient and may take several weeks before his circadian rhythm returns to normal after his recent hospitalization and rehab stay.  He can try melatonin nightly.  Would avoid any prescription medications at this point due to anticholinergic properties in light of his recent urinary retention issues.

## 2023-07-04 NOTE — Assessment & Plan Note (Signed)
Unfortunately his postoperative course was complicated by urinary retention.  He is following with urology for this and will be seeing them soon.  He is on finasteride 5 mg daily and was recently started on Flomax 0.4 mg daily.  We will refill his Flomax today.  He will continue intermittent catheterization until he can see his urology soon.

## 2023-07-04 NOTE — Assessment & Plan Note (Signed)
Rate controlled on metoprolol 50 mg daily and anticoagulated on Eliquis 5 mg daily.

## 2023-07-05 ENCOUNTER — Telehealth: Payer: Self-pay | Admitting: Internal Medicine

## 2023-07-05 ENCOUNTER — Other Ambulatory Visit: Payer: Self-pay | Admitting: *Deleted

## 2023-07-05 DIAGNOSIS — I4821 Permanent atrial fibrillation: Secondary | ICD-10-CM

## 2023-07-05 NOTE — Telephone Encounter (Deleted)
Prescription refill request for Eliquis received. Indication: a fib Last office visit: 2/7 /24 Scr: 1.0 Age: 82 Weight: 82kg

## 2023-07-05 NOTE — Telephone Encounter (Signed)
Pt c/o medication issue:  1. Name of Medication:  Valsartan 80 MG  2. How are you currently taking this medication (dosage and times per day)?   3. Are you having a reaction (difficulty breathing--STAT)?   4. What is your medication issue?   Patient would like to inform Dr. Graciela Husbands that Candesartan was discontinued by a doctor at [facility] and he has been switched to Valsartan 80 MG.

## 2023-07-05 NOTE — Telephone Encounter (Signed)
Prescription refill request for Eliquis received. Indication: a fib Last office visit: 2/7 /24 Scr: 1.0 Age: 82 Weight: 82kg  Improvement in renal function. Can now be switched back to 5mg  BID.  Spoke with patient to let him know of change. Patient self catheterizes 4x a day and is concerned he will begin bleeding if he is placed on 5mg  BID. Prefers to stay on 2.5mg  BID. Would like input from Dr Graciela Husbands.

## 2023-07-05 NOTE — Telephone Encounter (Signed)
Spoke with Dr Graciela Husbands who recommends pt take Eliquis 5mg  - 1 tablet by mouth twice daily and if he develops issues with bleeding we will address at that time.

## 2023-07-05 NOTE — Telephone Encounter (Signed)
*  STAT* If patient is at the pharmacy, call can be transferred to refill team.   1. Which medications need to be refilled? (please list name of each medication and dose if known)  apixaban (ELIQUIS) 2.5 MG TABS tablet  2. Which pharmacy/location (including street and city if local pharmacy) is medication to be sent to? [id number] - [place], [place] - [address]  3. Do they need a 30 day or 90 day supply? 90 day supply

## 2023-07-08 ENCOUNTER — Telehealth: Payer: Self-pay | Admitting: Internal Medicine

## 2023-07-08 ENCOUNTER — Encounter: Payer: Self-pay | Admitting: Family Medicine

## 2023-07-08 DIAGNOSIS — I10 Essential (primary) hypertension: Secondary | ICD-10-CM

## 2023-07-08 NOTE — Telephone Encounter (Signed)
Pt states he just had surgery and needs to speak with a nurse about changing some prescriptions. Please advise

## 2023-07-08 NOTE — Telephone Encounter (Signed)
Patient wants Dr. Graciela Husbands to change his amlodipine for something else for BP due to edema in his BLE. Patient also wants to make sure his eliquis is not being increased. Patient wants to make sure Valsartan is being prescribed and not his previous ARB. Informed patient that no changes have been made to his medication list recently. Will send message to Dr. Graciela Husbands for BP medication change.

## 2023-07-09 DIAGNOSIS — C7800 Secondary malignant neoplasm of unspecified lung: Secondary | ICD-10-CM | POA: Diagnosis not present

## 2023-07-09 DIAGNOSIS — Z09 Encounter for follow-up examination after completed treatment for conditions other than malignant neoplasm: Secondary | ICD-10-CM | POA: Diagnosis not present

## 2023-07-10 MED ORDER — APIXABAN 5 MG PO TABS: 5.0000 mg | ORAL_TABLET | Freq: Two times a day (BID) | ORAL | 5 refills | Status: DC

## 2023-07-10 MED ORDER — VALSARTAN 80 MG PO TABS: 80.0000 mg | ORAL_TABLET | Freq: Every day | ORAL | 1 refills | Status: DC

## 2023-07-10 MED ORDER — FUROSEMIDE 40 MG PO TABS: 20.0000 mg | ORAL_TABLET | Freq: Every day | ORAL | Status: DC

## 2023-07-10 MED ORDER — AMLODIPINE BESYLATE 5 MG PO TABS: 2.5000 mg | ORAL_TABLET | Freq: Every day | ORAL | Status: DC

## 2023-07-10 NOTE — Telephone Encounter (Signed)
Spoke with pt who states he has seen his PCP who recommends Amlodipine 5mg  be replaced with another medication due to swelling.  PCP also recommended pt's Furosemide be replaced with another diuretic due to pt's low sodium.  Pt states he will also need another Rx for Valsartan if Dr Graciela Husbands would like for him to continue this medication.  Pt has recently been discharged from rehab.  Pt is due to be seen by Dr Graciela Husbands 10/2022. Pt advised will discuss with Dr Graciela Husbands for further recommendation.  Pt verbalizes understanding and thanked Charity fundraiser for the call.

## 2023-07-11 DIAGNOSIS — N401 Enlarged prostate with lower urinary tract symptoms: Secondary | ICD-10-CM | POA: Diagnosis not present

## 2023-07-11 DIAGNOSIS — R338 Other retention of urine: Secondary | ICD-10-CM | POA: Diagnosis not present

## 2023-07-16 DIAGNOSIS — R948 Abnormal results of function studies of other organs and systems: Secondary | ICD-10-CM | POA: Insufficient documentation

## 2023-07-16 NOTE — Assessment & Plan Note (Addendum)
04/2021 Colonoscopy with cold snare polypectomy x 4 - Four 2 to 5 mm polyps in the ascending colon, removed with a cold snare. Resected and retrieved. - Diverticulosis in the sigmoid colon. - Internal hemorrhoids. Last PET in 05/2023: "Focal uptake within the sigmoid colon which has increased in conspicuity over time. Recommend correlation with colonoscopy"   Will follow up with New PET to decide on management.

## 2023-07-16 NOTE — Progress Notes (Addendum)
[facility] CONSULT NOTE  Patient Care Team: Ardith Dark, MD as PCP - General (Family Medicine) Duke Salvia, MD as PCP - Electrophysiology (Cardiology) Duke Salvia, MD (Cardiology) Marcine Matar, MD as Attending Physician (Urology) Donzetta Starch, MD as Consulting Physician (Dermatology) Antony Contras, MD as Consulting Physician (Ophthalmology) Hilarie Fredrickson, MD as Consulting Physician (Gastroenterology) Dahlia Byes, [facility] (Inactive) as Pharmacist (Pharmacist) Alejandro Mulling, RN as [facility] Care Management  ASSESSMENT & PLAN:  Nicholas Bird is a 82 y.o.male with history of atrial fibrillation on anticoagulation, hypertension, hyperlipidemia [facility].  Current Diagnosis: Metastatic melanoma with lung metastases.  Suspicious for bone metastases. BRAF negative. Initial diagnosis: 2016 melanoma of scalp  We discussed his diagnosis, management plan.  We discussed that stage IV melanoma is treatable, and treatment is considered palliative and not curable.  Immunotherapy has improved survival in many patients over the past few years.  He has talked to Dr. Dan Europe at [facility] about nivolumab. We went over the side effects today.  I explained to him how it works. Side effects of immunotherapy are mostly related to immune related reaction.  They depend on which organ may be affected.  Patient can have hyper or hypothyroidism, skin rash, fever, pneumonitis, hepatitis, carditis, colitis, nephritis or other endorgan damage from immune related reaction.  Severe fatal reaction such as carditis or encephalitis has been reported. Rarely severe side effects can result in death.  After discussion Nicholas Bird understands and would like to proceed.  Malignant melanoma Will repeat PET whole body in the coming weeks as it is almost 3 months follow up from September as new baseline MRI of brain scheduled on 12/2 at [facility]  to start nivolumab on Friday 12/6 and then every 2 weeks  Abnormal PET scan of colon 04/2021 Colonoscopy with cold snare polypectomy x 4 - Four 2 to 5 mm polyps in the ascending colon, removed with a cold snare. Resected and retrieved. - Diverticulosis in the sigmoid colon. - Internal hemorrhoids. Last PET in 05/2023: "Focal uptake within the sigmoid colon which has increased in conspicuity over time. Recommend correlation with colonoscopy"   Will follow up with New PET to decide on management.  Macrocytic anemia Macrocytic anemia with low B12. Will start B12 500 mcg daily  Bone lesion Two sites of focal osseous avidity which are suspicious but indeterminate for additional sites of involvement by metastasis involving the T8 and L1 vertebral bodies   Will follow up after PET. No back pain currently.   Orders Placed This Encounter  Procedures   NM PET Image Initial (PI) Whole Body    Standing Status:   Future    Standing Expiration Date:   07/17/2024    Scheduling Instructions:     please get [facility] PET for comparison    Order Specific Question:   If indicated for the ordered procedure, I authorize the administration of a radiopharmaceutical per Radiology protocol    Answer:   Yes    Order Specific Question:   Preferred imaging location?    Answer:   [place]   Lactate dehydrogenase    Standing Status:   Future    Standing Expiration Date:   08/07/2024   CBC with Differential (Cancer Center Only)    Standing Status:   Future    Standing Expiration Date:   08/07/2024   CMP (Cancer Center only)    Standing Status:   Future  Standing Expiration Date:   08/07/2024   T4    Standing Status:   Future    Standing Expiration Date:   08/07/2024   TSH    Standing Status:   Future    Standing Expiration Date:   08/07/2024   CBC with Differential (Cancer Center Only)    Standing Status:   Future    Number of Occurrences:   1    Standing Expiration Date:   07/17/2024   CMP (Cancer Center  only)    Standing Status:   Future    Number of Occurrences:   1    Standing Expiration Date:   07/17/2024   Ferritin    Standing Status:   Future    Number of Occurrences:   1    Standing Expiration Date:   07/17/2024   Vitamin B12    Standing Status:   Future    Number of Occurrences:   1    Standing Expiration Date:   07/17/2024   Folate    Standing Status:   Future    Number of Occurrences:   1    Standing Expiration Date:   07/17/2024   Lactate dehydrogenase    Standing Status:   Future    Number of Occurrences:   1    Standing Expiration Date:   07/17/2024   TSH    Standing Status:   Standing    Number of Occurrences:   22    Standing Expiration Date:   07/17/2024   T4, free    Standing Status:   Standing    Number of Occurrences:   22    Standing Expiration Date:   07/17/2024   Lactate dehydrogenase    Standing Status:   Future    Standing Expiration Date:   08/21/2024   CBC with Differential (Cancer Center Only)    Standing Status:   Future    Standing Expiration Date:   08/21/2024   CMP (Cancer Center only)    Standing Status:   Future    Standing Expiration Date:   08/21/2024   Lactate dehydrogenase    Standing Status:   Future    Standing Expiration Date:   09/04/2024   CBC with Differential (Cancer Center Only)    Standing Status:   Future    Standing Expiration Date:   09/04/2024   CMP (Cancer Center only)    Standing Status:   Future    Standing Expiration Date:   09/04/2024   T4    Standing Status:   Future    Standing Expiration Date:   09/04/2024   TSH    Standing Status:   Future    Standing Expiration Date:   09/04/2024   Lactate dehydrogenase    Standing Status:   Future    Standing Expiration Date:   09/18/2024   CBC with Differential (Cancer Center Only)    Standing Status:   Future    Standing Expiration Date:   09/18/2024   CMP (Cancer Center only)    Standing Status:   Future    Standing Expiration Date:   09/18/2024   Lactate dehydrogenase     Standing Status:   Future    Standing Expiration Date:   10/02/2024   CBC with Differential (Cancer Center Only)    Standing Status:   Future    Standing Expiration Date:   10/02/2024   CMP (Cancer Center only)    Standing Status:   Future  Standing Expiration Date:   10/02/2024   Lactate dehydrogenase    Standing Status:   Future    Standing Expiration Date:   10/16/2024   CBC with Differential (Cancer Center Only)    Standing Status:   Future    Standing Expiration Date:   10/16/2024   CMP (Cancer Center only)    Standing Status:   Future    Standing Expiration Date:   10/16/2024   T4    Standing Status:   Future    Standing Expiration Date:   10/16/2024   TSH    Standing Status:   Future    Standing Expiration Date:   10/16/2024     All questions were answered. The patient knows to call the clinic with any problems, questions or concerns. No barriers to learning was detected.  Melven Sartorius, MD 11/14/20242:55 PM  CHIEF COMPLAINTS/PURPOSE OF CONSULTATION:  Melanoma  HISTORY OF PRESENTING ILLNESS:  Nicholas Bird 82 y.o. male is here because of invasive melanoma I have reviewed his chart and materials related to his cancer extensively and collaborated history with the patient. Summary of oncologic history is as follows: Oncology History  Malignant melanoma (HCC)  01/01/2015 Initial Diagnosis   Malignant melanoma (HCC)   12/2014 Surgery   April 2016 WLE and SLN 01/31/2015: Reexcision    02/25/2018 Imaging   CT CAP Subcentimeter right upper lobe subpleural and left upper lobe perifissural nodules, new from 01/24/2016, indeterminate. Recommend short interval follow-up (3 months), given histor    04/07/2019 Imaging   CT Chest: No evidence of metastatic disease within the chest. Left upper lobe perifissural nodule no longer evident. Right upper lobe subpleural nodule no longer evident.   CT AP No evidence of metastatic disease.     04/2020 Pathology Results   Summer 2021: New  scalp lesion, in transit  path: -Malignant melanoma, involving dermal tissue    04/12/2020 Imaging   CT chest: NED CT AP: NED    05/04/2020 Imaging   CT Neck: No mass or lymphadenopathy within the neck.    05/25/2020 - 07/25/2020 Chemotherapy   5 cycles of TVEC   01/24/2021 PET scan   PET whole body - No abnormal foci of increased radiotracer uptake to suggest metastatic disease.  -Small focus of increased uptake within the proximal sigmoid colon without adjacent stranding or definite wall thickening, indeterminant. Recommend further evaluation with colonoscopy if clinically    08/24/2021 PET scan   PET whole body - No abnormal foci of increased radiotracer uptake to suggest metastatic disease.   - Small focus of increased uptake within the proximal sigmoid colon, similar to 01/24/2021. Recommend clinical correlation and/or further evaluation with colonoscopy if clinically indicated    01/31/2022 Surgery   (Outside case, [phone], 1 slide, 01/23/2022) Skin, left inferior lateral anterior scalp, shave - Metastatic malignant melanoma Size: 2 mm Pattern: Pigmented epithelioid cells forming small plaque in superficial dermis and showing epidermotropism Tumor infiltrating lymphocytes: Brisk Nuclear grade: 3/3 Margins: Focally present adjacent to edge of tissue sections    02/21/2022 - 05/09/2022 Chemotherapy   had an intransit recurrence of melanoma and was retreated with TVEC.  Biopsy in October 2023 showed NED.    05/09/2022 PET scan   - Mildly avid 3 mm right upper lobe subcentimeter pulmonary nodule, possibly increased from 2 mm on prior; recommend attention on follow-up.   -No definite evidence of metabolically active disease.    05/28/2022 Pathology Results   Diagnosis   Skin, Left  temple, excision - Dermal fibrosis with lymphocytic and granulomatous inflammation consistent with site of recent therapy - No residual melanoma identified - Margins free of tumor      02/15/2023  PET scan   - Focal area of hypermetabolic activity is seen in the ascending colon, indeterminate. Recommend correlation with colonoscopy.   -Unchanged size of mildly hypermetabolic left paratracheal lymph node, indeterminate. Recommend attention on follow-up imaging.   -Pancreatic calcifications likely reflect sequela of chronic pancreatitis.   -Bladder diverticuli and enlarged prostate.    05/13/2023 PET scan   1.  Evidence of progressive disease as demonstrated by interval enlargement of right upper lobe pulmonary nodule most concerning for metastasis by melanoma. Additional single right pulmonary nodule and 2 sites of focal osseous avidity which are suspicious but indeterminate for additional sites of involvement by metastasis involving the T8 and L1 vertebral bodies.  2.  Focal uptake within the sigmoid colon which has increased in conspicuity over time. Recommend correlation with colonoscopy if not recently completed with differential diagnosis including adenoma, carcinoma, and inflammatory lesion (given associated coarse calcifications/high density material). No evidence of complication (i.e.  fluid collection, or free air) by low-dose unenhanced CT.  3.  Persistent FDG abnormality of the left anterior shin associated with a dermal nodule. Recommend direct inspection as additional site of cutaneous neoplasm is not excluded.    06/14/2023 Pathology Results   A: Lung, right upper lobe, lobectomy  - Metastatic melanoma, multifocal (largest focus 2.4 cm, with two sub-centimeter nodules also identified microscopically, each 3 mm) - Surgical margins negative  - BRAF (VE1): Negative - TILs: brisk - Two lymph nodes, negative for malignancy (0/2)   08/08/2023 -  Chemotherapy   Patient is on Treatment Plan : METASTATIC MELANOMA Nivolumab (240) q14d       MEDICAL HISTORY:  Past Medical History:  Diagnosis Date   Anticoagulant long-term use    Atrial fibrillation, permanent (HCC)     CARIOLOGIST-  DR Graciela Husbands   Bladder diverticulum    BPH (benign prostatic hypertrophy)    ED (erectile dysfunction) of organic origin    Elevated LFTs    CHRONIC   Gall stones    Gout    per pt 12-09-2013  gout is stable   Hematuria    History of pancreatitis    10/2012--  resolved    History of TIA (transient ischemic attack)    04/2009--  no residual   Hyperlipemia    Hypertension    Liver disease    Melanoma (HCC)    Personal history of colonic polyps 07/12/2008   TUBULAR ADENOMA   Rosacea     SURGICAL HISTORY: Past Surgical History:  Procedure Laterality Date   CARDIAC CATHETERIZATION  09-19-1998   false positive stress test---  normal cath and preserved lvf   CATARACT EXTRACTION W/ INTRAOCULAR LENS  IMPLANT, BILATERAL Bilateral 2008   CYSTOSCOPY WITH BIOPSY N/A 12/14/2013   Procedure: CYSTOSCOPY WITH BIOPSY;  Surgeon: Marcine Matar, MD;  Location: [facility];  Service: Urology;  Laterality: N/A;   INGUINAL HERNIA REPAIR Left 01-14-2001   LAPAROSCOPIC CHOLECYSTECTOMY  10-21-2008   AND LIVER BX'S   LAPAROSCOPIC INGUINAL HERNIA REPAIR Bilateral 05-22-2009    SOCIAL HISTORY: Social History   Socioeconomic History   Marital status: Married    Spouse name: Not on file   Number of children: 2   Years of education: Not on file   Highest education level: Bachelor's degree (e.g., BA, AB, BS)  Occupational  History   Occupation: retired    Associate Professor: RETIRED  Tobacco Use   Smoking status: Never   Smokeless tobacco: Never  Vaping Use   Vaping status: Never Used  Substance and Sexual Activity   Alcohol use: Yes    Alcohol/week: 14.0 standard drinks of alcohol    Types: 14 Shots of liquor per week    Comment: 3 oz. per day   Drug use: No   Sexual activity: Yes    Partners: Female  Other Topics Concern   Not on file  Social History Narrative   Not on file   Social Determinants of Health   Financial Resource Strain: Low Risk  (06/20/2023)    Received from [facility]   Overall Financial Resource Strain (CARDIA)    Difficulty of Paying Living Expenses: Not hard at all  Food Insecurity: No Food Insecurity (06/30/2023)   Hunger Vital Sign    Worried About Running Out of Food in the Last Year: Never true    Ran Out of Food in the Last Year: Never true  Transportation Needs: No Transportation Needs (06/30/2023)   PRAPARE - Administrator, Civil Service (Medical): No    Lack of Transportation (Non-Medical): No  Physical Activity: Sufficiently Active (02/16/2022)   Exercise Vital Sign    Days of Exercise per Week: 6 days    Minutes of Exercise per Session: 30 min  Stress: Stress Concern Present (06/30/2023)   [employer] of Occupational Health - Occupational Stress Questionnaire    Feeling of Stress : Rather much  Social Connections: Socially Integrated (06/30/2023)   Social Connection and Isolation Panel [NHANES]    Frequency of Communication with Friends and Family: More than three times a week    Frequency of Social Gatherings with Friends and Family: Three times a week    Attends Religious Services: More than 4 times per year    Active Member of Clubs or Organizations: Yes    Attends Banker Meetings: More than 4 times per year    Marital Status: Married  Catering manager Violence: Not At Risk (02/16/2022)   Humiliation, Afraid, Rape, and Kick questionnaire    Fear of Current or Ex-Partner: No    Emotionally Abused: No    Physically Abused: No    Sexually Abused: No    FAMILY HISTORY: Family History  Problem Relation Age of Onset   Lung cancer Mother    Skin cancer Sister    Colon cancer Neg Hx    Esophageal cancer Neg Hx    Pancreatic cancer Neg Hx    Stomach cancer Neg Hx    Rectal cancer Neg Hx     ALLERGIES:  is allergic to pertussis vaccines.  MEDICATIONS:  Current Outpatient Medications  Medication Sig Dispense Refill   allopurinol (ZYLOPRIM) 300 MG tablet TAKE 1  TABLET BY MOUTH EVERY DAY 90 tablet 0   amLODipine (NORVASC) 5 MG tablet Take 0.5 tablets (2.5 mg total) by mouth daily.     ampicillin (PRINCIPEN) 500 MG capsule Take 500 mg by mouth 2 (two) times daily as needed. (Patient not taking: Reported on 07/04/2023)     apixaban (ELIQUIS) 5 MG TABS tablet Take 1 tablet (5 mg total) by mouth 2 (two) times daily. 60 tablet 5   ferrous sulfate 325 (65 FE) MG tablet Take 325 mg by mouth daily.     finasteride (PROSCAR) 5 MG tablet Take 5 mg by mouth.     furosemide (LASIX)  40 MG tablet Take 0.5 tablets (20 mg total) by mouth daily.     Melatonin 10 MG CAPS Take 10 mg nightly 30 capsule 5   metoprolol succinate (TOPROL-XL) 50 MG 24 hr tablet Take 1 tablet (50 mg total) by mouth daily. Take with or immediately following a meal. 90 tablet 3   omeprazole (PRILOSEC) 20 MG capsule Take 1 capsule (20 mg total) by mouth daily. 30 capsule 11   tamsulosin (FLOMAX) 0.4 MG CAPS capsule Take 1 capsule (0.4 mg total) by mouth daily. 30 capsule 0   valsartan (DIOVAN) 80 MG tablet Take 1 tablet (80 mg total) by mouth daily. 90 tablet 1   No current facility-administered medications for this visit.    REVIEW OF SYSTEMS:   Constitutional: Denies fevers, weight loss or abnormal night sweats Eyes: Denies blurriness of vision, double vision Respiratory: Denies cough, shortness of breath or wheezes Cardiovascular: Denies palpitation, chest discomfort or lower extremity swelling Gastrointestinal:  Denies nausea, vomiting, abdominal pain, diarrhea, constipation or change in bowel habits GU: Denies any dysuria, hematuria, hesitancy Skin: Denies abnormal skin rashes Lymphatics: Denies new lymphadenopathy or easy bruising Neurological: Denies numbness, tingling or new weaknesses Behavioral/Psych: Mood is stable, no new changes  All other systems were reviewed with the patient and are negative.  PHYSICAL EXAMINATION: ECOG PERFORMANCE STATUS: 1 - Symptomatic but completely  ambulatory  Vitals:   07/18/23 0926  BP: (!) 144/90  Pulse: 76  Resp: 17  Temp: (!) 97.5 F (36.4 C)  SpO2: 99%   Filed Weights   07/18/23 0926  Weight: 181 lb 6 oz (82.3 kg)    GENERAL: alert, no distress and comfortable SKIN: skin color is normal, no jaundice, rashes or significant lesions EYES: sclera clear OROPHARYNX: no exudate, no erythema NECK: supple LYMPH:  no palpable lymphadenopathy in the cervical, axillary regions LUNGS: Effort normal, no respiratory distress.  Clear to auscultation bilaterally HEART: regular rate & rhythm and no lower extremity edema ABDOMEN: soft, non-tender and nondistended Musculoskeletal: no point tenderness NEURO: no focal motor/sensory deficits  LABORATORY DATA:  I have reviewed the data as listed Lab Results  Component Value Date   WBC 5.9 07/18/2023   HGB 12.1 (L) 07/18/2023   HCT 35.6 (L) 07/18/2023   MCV 103.5 (H) 07/18/2023   PLT 217 07/18/2023   Recent Labs    10/10/22 1015 06/24/23 0000 06/28/23 0000 07/18/23 1055  NA 133* 124* 126* 135  K 4.4 4.0 4.1 3.5  CL 94* 89* 91* 98  CO2 22 26* 24* 30  GLUCOSE 97  --   --  133*  BUN 27 12 11 8   CREATININE 1.65* 1.0 0.8 0.89  CALCIUM 9.7 8.5* 9.0 9.8  GFRNONAA  --   --   --  >60  PROT  --   --   --  7.3  ALBUMIN  --   --   --  4.5  AST  --   --   --  15  ALT  --   --   --  11  ALKPHOS  --   --   --  89  BILITOT  --   --   --  1.0    RADIOGRAPHIC STUDIES: I have personally reviewed the radiological images as listed and agreed with the findings in the report. No results found.

## 2023-07-16 NOTE — Assessment & Plan Note (Signed)
Will repeat PET whole body in the coming weeks as it is almost 3 months follow up from September as new baseline MRI of brain scheduled on 12/2 at [facility] to start nivolumab on Friday 12/6 and then every 2 weeks

## 2023-07-18 ENCOUNTER — Inpatient Hospital Stay: Payer: Medicare HMO

## 2023-07-18 ENCOUNTER — Encounter: Payer: Self-pay | Admitting: Internal Medicine

## 2023-07-18 VITALS — BP 144/90 | HR 76 | Temp 97.5°F | Resp 17 | Wt 181.4 lb

## 2023-07-18 DIAGNOSIS — R948 Abnormal results of function studies of other organs and systems: Secondary | ICD-10-CM

## 2023-07-18 DIAGNOSIS — D539 Nutritional anemia, unspecified: Secondary | ICD-10-CM

## 2023-07-18 DIAGNOSIS — C7800 Secondary malignant neoplasm of unspecified lung: Secondary | ICD-10-CM | POA: Diagnosis not present

## 2023-07-18 DIAGNOSIS — E785 Hyperlipidemia, unspecified: Secondary | ICD-10-CM | POA: Insufficient documentation

## 2023-07-18 DIAGNOSIS — C434 Malignant melanoma of scalp and neck: Secondary | ICD-10-CM | POA: Diagnosis not present

## 2023-07-18 DIAGNOSIS — I4821 Permanent atrial fibrillation: Secondary | ICD-10-CM | POA: Diagnosis not present

## 2023-07-18 DIAGNOSIS — C438 Malignant melanoma of overlapping sites of skin: Secondary | ICD-10-CM

## 2023-07-18 DIAGNOSIS — M899 Disorder of bone, unspecified: Secondary | ICD-10-CM

## 2023-07-18 DIAGNOSIS — I10 Essential (primary) hypertension: Secondary | ICD-10-CM | POA: Diagnosis not present

## 2023-07-18 DIAGNOSIS — D649 Anemia, unspecified: Secondary | ICD-10-CM | POA: Insufficient documentation

## 2023-07-18 DIAGNOSIS — Z7901 Long term (current) use of anticoagulants: Secondary | ICD-10-CM | POA: Insufficient documentation

## 2023-07-18 DIAGNOSIS — Z79899 Other long term (current) drug therapy: Secondary | ICD-10-CM | POA: Insufficient documentation

## 2023-07-18 LAB — TSH: TSH: 1.776 u[IU]/mL (ref 0.350–4.500)

## 2023-07-18 LAB — CBC WITH DIFFERENTIAL (CANCER CENTER ONLY)
Abs Immature Granulocytes: 0.02 10*3/uL (ref 0.00–0.07)
Basophils Absolute: 0 10*3/uL (ref 0.0–0.1)
Basophils Relative: 1 %
Eosinophils Absolute: 0.1 10*3/uL (ref 0.0–0.5)
Eosinophils Relative: 1 %
HCT: 35.6 % — ABNORMAL LOW (ref 39.0–52.0)
Hemoglobin: 12.1 g/dL — ABNORMAL LOW (ref 13.0–17.0)
Immature Granulocytes: 0 %
Lymphocytes Relative: 11 %
Lymphs Abs: 0.6 10*3/uL — ABNORMAL LOW (ref 0.7–4.0)
MCH: 35.2 pg — ABNORMAL HIGH (ref 26.0–34.0)
MCHC: 34 g/dL (ref 30.0–36.0)
MCV: 103.5 fL — ABNORMAL HIGH (ref 80.0–100.0)
Monocytes Absolute: 0.7 10*3/uL (ref 0.1–1.0)
Monocytes Relative: 11 %
Neutro Abs: 4.6 10*3/uL (ref 1.7–7.7)
Neutrophils Relative %: 76 %
Platelet Count: 217 10*3/uL (ref 150–400)
RBC: 3.44 MIL/uL — ABNORMAL LOW (ref 4.22–5.81)
RDW: 15.3 % (ref 11.5–15.5)
WBC Count: 5.9 10*3/uL (ref 4.0–10.5)
nRBC: 0 % (ref 0.0–0.2)

## 2023-07-18 LAB — CMP (CANCER CENTER ONLY)
ALT: 11 U/L (ref 0–44)
AST: 15 U/L (ref 15–41)
Albumin: 4.5 g/dL (ref 3.5–5.0)
Alkaline Phosphatase: 89 U/L (ref 38–126)
Anion gap: 7 (ref 5–15)
BUN: 8 mg/dL (ref 8–23)
CO2: 30 mmol/L (ref 22–32)
Calcium: 9.8 mg/dL (ref 8.9–10.3)
Chloride: 98 mmol/L (ref 98–111)
Creatinine: 0.89 mg/dL (ref 0.61–1.24)
GFR, Estimated: >60 mL/min (ref >60)
Glucose, Bld: 133 mg/dL — ABNORMAL HIGH (ref 70–99)
Potassium: 3.5 mmol/L (ref 3.5–5.1)
Sodium: 135 mmol/L (ref 135–145)
Total Bilirubin: 1 mg/dL (ref <1.2)
Total Protein: 7.3 g/dL (ref 6.5–8.1)

## 2023-07-18 LAB — LACTATE DEHYDROGENASE: LDH: 155 U/L (ref 98–192)

## 2023-07-18 LAB — T4, FREE: Free T4: 1.23 ng/dL — ABNORMAL HIGH (ref 0.61–1.12)

## 2023-07-18 LAB — FERRITIN: Ferritin: 97 ng/mL (ref 24–336)

## 2023-07-18 LAB — VITAMIN B12: Vitamin B-12: 275 pg/mL (ref 180–914)

## 2023-07-18 LAB — FOLATE: Folate: 6 ng/mL (ref >5.9)

## 2023-07-18 NOTE — Progress Notes (Signed)
START ON PATHWAY REGIMEN - Melanoma and Other Skin Cancers     A cycle is every 14 days:     Nivolumab   **Always confirm dose/schedule in your pharmacy ordering system**  Patient Characteristics: Melanoma, Cutaneous/Unknown Primary, Distant Metastases, Unresectable, No Brain Metastases, First Line, BRAF V600 Wild Type / BRAF V600 Results Pending or Unknown, Candidate for Immunotherapy Disease Classification: Melanoma Disease Subtype: Cutaneous BRAF V600 Mutation Status: Awaiting BRAF V600 Results Therapeutic Status: Distant Metastases Line of Therapy: First Line Immunotherapy Candidate Status: Candidate for Immunotherapy Intent of Therapy: Non-Curative / Palliative Intent, Not Discussed with Patient

## 2023-07-18 NOTE — Assessment & Plan Note (Addendum)
Macrocytic anemia with low B12. Will start B12 500 mcg daily

## 2023-07-18 NOTE — Assessment & Plan Note (Signed)
Two sites of focal osseous avidity which are suspicious but indeterminate for additional sites of involvement by metastasis involving the T8 and L1 vertebral bodies   Will follow up after PET. No back pain currently.

## 2023-07-19 ENCOUNTER — Other Ambulatory Visit: Payer: Self-pay

## 2023-07-22 ENCOUNTER — Telehealth: Payer: Self-pay

## 2023-07-22 DIAGNOSIS — C7800 Secondary malignant neoplasm of unspecified lung: Secondary | ICD-10-CM | POA: Diagnosis not present

## 2023-07-22 DIAGNOSIS — C434 Malignant melanoma of scalp and neck: Secondary | ICD-10-CM | POA: Diagnosis not present

## 2023-07-22 NOTE — Telephone Encounter (Signed)
[facility] Clinical Social Work  Clinical Social Work was referred by new patient protocol for assessment of psychosocial needs.  Clinical Social Worker attempted to contact patient by phone to offer support and assess for needs.   No answer, left vm with direct contact information.      Marguerita Merles, LCSW Clinical Social Worker [facility]

## 2023-07-23 DIAGNOSIS — C4402 Squamous cell carcinoma of skin of lip: Secondary | ICD-10-CM | POA: Diagnosis not present

## 2023-07-23 DIAGNOSIS — Z85820 Personal history of malignant melanoma of skin: Secondary | ICD-10-CM | POA: Diagnosis not present

## 2023-07-31 NOTE — Progress Notes (Signed)

## 2023-08-05 DIAGNOSIS — R911 Solitary pulmonary nodule: Secondary | ICD-10-CM | POA: Diagnosis not present

## 2023-08-05 DIAGNOSIS — G939 Disorder of brain, unspecified: Secondary | ICD-10-CM | POA: Diagnosis not present

## 2023-08-05 DIAGNOSIS — J90 Pleural effusion, not elsewhere classified: Secondary | ICD-10-CM | POA: Diagnosis not present

## 2023-08-05 DIAGNOSIS — C7800 Secondary malignant neoplasm of unspecified lung: Secondary | ICD-10-CM | POA: Diagnosis not present

## 2023-08-05 DIAGNOSIS — Z902 Acquired absence of lung [part of]: Secondary | ICD-10-CM | POA: Diagnosis not present

## 2023-08-05 DIAGNOSIS — C7801 Secondary malignant neoplasm of right lung: Secondary | ICD-10-CM | POA: Diagnosis not present

## 2023-08-05 DIAGNOSIS — I2584 Coronary atherosclerosis due to calcified coronary lesion: Secondary | ICD-10-CM | POA: Diagnosis not present

## 2023-08-05 DIAGNOSIS — G9389 Other specified disorders of brain: Secondary | ICD-10-CM | POA: Diagnosis not present

## 2023-08-05 DIAGNOSIS — I2510 Atherosclerotic heart disease of native coronary artery without angina pectoris: Secondary | ICD-10-CM | POA: Diagnosis not present

## 2023-08-06 ENCOUNTER — Telehealth: Payer: Self-pay

## 2023-08-06 NOTE — Progress Notes (Unsigned)
Patient Care Team: Ardith Dark, MD as PCP - General (Family Medicine) Duke Salvia, MD as PCP - Electrophysiology (Cardiology) Duke Salvia, MD (Cardiology) Marcine Matar, MD as Attending Physician (Urology) Donzetta Starch, MD as Consulting Physician (Dermatology) Antony Contras, MD as Consulting Physician (Ophthalmology) Hilarie Fredrickson, MD as Consulting Physician (Gastroenterology) Dahlia Byes, [facility] (Inactive) as Pharmacist (Pharmacist) Alejandro Mulling, RN as [facility] Care Management  Clinic Day:  08/06/2023  Referring physician: Melven Sartorius, MD  ASSESSMENT & PLAN:   Assessment & Plan: No problem-specific Assessment & Plan notes found for this encounter.    The patient understands the plans discussed today and is in agreement with them.  He knows to contact our office if he develops concerns prior to his next appointment.  Melven Sartorius, MD  [facility] - A DEPT OF [facility] [address] [place] [address] Dept: [phone] Dept Fax: [phone]   No orders of the defined types were placed in this encounter.     CHIEF COMPLAINT:  CC: ***  Current Treatment:  ***  INTERVAL HISTORY:  Kapena is here today for repeat clinical assessment. He denies fevers or chills. He denies pain. His appetite is good. His weight {Weight change:10426}.  I have reviewed the past medical history, past surgical history, social history and family history with the patient and they are unchanged from previous note.  ALLERGIES:  is allergic to pertussis vaccines.  MEDICATIONS:  Current Outpatient Medications  Medication Sig Dispense Refill   allopurinol (ZYLOPRIM) 300 MG tablet TAKE 1 TABLET BY MOUTH EVERY DAY 90 tablet 0   amLODipine (NORVASC) 5 MG tablet Take 0.5 tablets (2.5 mg total) by mouth daily.     ampicillin (PRINCIPEN) 500 MG capsule Take 500 mg by mouth 2 (two) times daily  as needed. (Patient not taking: Reported on 07/04/2023)     apixaban (ELIQUIS) 5 MG TABS tablet Take 1 tablet (5 mg total) by mouth 2 (two) times daily. 60 tablet 5   ferrous sulfate 325 (65 FE) MG tablet Take 325 mg by mouth daily.     finasteride (PROSCAR) 5 MG tablet Take 5 mg by mouth.     furosemide (LASIX) 40 MG tablet Take 0.5 tablets (20 mg total) by mouth daily.     Melatonin 10 MG CAPS Take 10 mg nightly 30 capsule 5   metoprolol succinate (TOPROL-XL) 50 MG 24 hr tablet Take 1 tablet (50 mg total) by mouth daily. Take with or immediately following a meal. 90 tablet 3   omeprazole (PRILOSEC) 20 MG capsule Take 1 capsule (20 mg total) by mouth daily. 30 capsule 11   tamsulosin (FLOMAX) 0.4 MG CAPS capsule Take 1 capsule (0.4 mg total) by mouth daily. 30 capsule 0   valsartan (DIOVAN) 80 MG tablet Take 1 tablet (80 mg total) by mouth daily. 90 tablet 1   No current facility-administered medications for this visit.    HISTORY OF PRESENT ILLNESS:   Oncology History  Malignant melanoma (HCC)  01/01/2015 Initial Diagnosis   Malignant melanoma (HCC)   12/2014 Surgery   April 2016 WLE and SLN 01/31/2015: Reexcision    02/25/2018 Imaging   CT CAP Subcentimeter right upper lobe subpleural and left upper lobe perifissural nodules, new from 01/24/2016, indeterminate. Recommend short interval follow-up (3 months), given histor    04/07/2019 Imaging   CT Chest: No evidence of metastatic disease within  the chest. Left upper lobe perifissural nodule no longer evident. Right upper lobe subpleural nodule no longer evident.   CT AP No evidence of metastatic disease.     04/2020 Pathology Results   Summer 2021: New scalp lesion, in transit  path: -Malignant melanoma, involving dermal tissue    04/12/2020 Imaging   CT chest: NED CT AP: NED    05/04/2020 Imaging   CT Neck: No mass or lymphadenopathy within the neck.    05/25/2020 - 07/25/2020 Chemotherapy   5 cycles of TVEC   01/24/2021 PET  scan   PET whole body - No abnormal foci of increased radiotracer uptake to suggest metastatic disease.  -Small focus of increased uptake within the proximal sigmoid colon without adjacent stranding or definite wall thickening, indeterminant. Recommend further evaluation with colonoscopy if clinically    08/24/2021 PET scan   PET whole body - No abnormal foci of increased radiotracer uptake to suggest metastatic disease.   - Small focus of increased uptake within the proximal sigmoid colon, similar to 01/24/2021. Recommend clinical correlation and/or further evaluation with colonoscopy if clinically indicated    01/31/2022 Surgery   (Outside case, [phone], 1 slide, 01/23/2022) Skin, left inferior lateral anterior scalp, shave - Metastatic malignant melanoma Size: 2 mm Pattern: Pigmented epithelioid cells forming small plaque in superficial dermis and showing epidermotropism Tumor infiltrating lymphocytes: Brisk Nuclear grade: 3/3 Margins: Focally present adjacent to edge of tissue sections    02/21/2022 - 05/09/2022 Chemotherapy   had an intransit recurrence of melanoma and was retreated with TVEC.  Biopsy in October 2023 showed NED.    05/09/2022 PET scan   - Mildly avid 3 mm right upper lobe subcentimeter pulmonary nodule, possibly increased from 2 mm on prior; recommend attention on follow-up.   -No definite evidence of metabolically active disease.    05/28/2022 Pathology Results   Diagnosis   Skin, Left temple, excision - Dermal fibrosis with lymphocytic and granulomatous inflammation consistent with site of recent therapy - No residual melanoma identified - Margins free of tumor      02/15/2023 PET scan   - Focal area of hypermetabolic activity is seen in the ascending colon, indeterminate. Recommend correlation with colonoscopy.   -Unchanged size of mildly hypermetabolic left paratracheal lymph node, indeterminate. Recommend attention on follow-up imaging.   -Pancreatic  calcifications likely reflect sequela of chronic pancreatitis.   -Bladder diverticuli and enlarged prostate.    05/13/2023 PET scan   1.  Evidence of progressive disease as demonstrated by interval enlargement of right upper lobe pulmonary nodule most concerning for metastasis by melanoma. Additional single right pulmonary nodule and 2 sites of focal osseous avidity which are suspicious but indeterminate for additional sites of involvement by metastasis involving the T8 and L1 vertebral bodies.  2.  Focal uptake within the sigmoid colon which has increased in conspicuity over time. Recommend correlation with colonoscopy if not recently completed with differential diagnosis including adenoma, carcinoma, and inflammatory lesion (given associated coarse calcifications/high density material). No evidence of complication (i.e.  fluid collection, or free air) by low-dose unenhanced CT.  3.  Persistent FDG abnormality of the left anterior shin associated with a dermal nodule. Recommend direct inspection as additional site of cutaneous neoplasm is not excluded.    06/14/2023 Pathology Results   A: Lung, right upper lobe, lobectomy  - Metastatic melanoma, multifocal (largest focus 2.4 cm, with two sub-centimeter nodules also identified microscopically, each 3 mm) - Surgical margins negative  - BRAF (VE1): Negative -  TILs: brisk - Two lymph nodes, negative for malignancy (0/2)   07/17/2023 Genetic Testing   [facility]: pertinent negatives: BRAF. KIT TERT C.-124C>T variant- promoter mutation NRAS p.G13D Missense variant (exon 2) - GOF TP53 p.R342P ARID1A p.G273fs NF1 p.R1362* PHLPP2 p.Q300* NF1 p.K191_R192delinsN*  Multiple VUS findings.   08/08/2023 -  Chemotherapy   Patient is on Treatment Plan : METASTATIC MELANOMA Nivolumab (240) q14d         REVIEW OF SYSTEMS:   All relevant systems were reviewed with the patient and are negative.   VITALS:  There were no vitals taken for this visit.  Wt  Readings from Last 3 Encounters:  07/18/23 181 lb 6 oz (82.3 kg)  07/04/23 180 lb 12.8 oz (82 kg)  06/24/23 190 lb (86.2 kg)    There is no height or weight on file to calculate BMI.  Performance status (ECOG): {CHL ONC [id number]  PHYSICAL EXAM:   GENERAL:alert, no distress and comfortable SKIN: skin color normal, no rashes  EYES: normal, sclera clear OROPHARYNX: no exudate, no erythema    NECK: supple,  non-tender, without nodularity LYMPH:  no palpable cervical lymphadenopathy LUNGS: clear to auscultation with normal breathing effort.  No wheeze or rales HEART: regular rate & rhythm and no murmurs and no lower extremity edema ABDOMEN: abdomen soft, non-tender and nondistended Musculoskeletal: no edema NEURO: alert, fluent speech, no focal motor/sensory deficits.  Strength and sensation equal bilaterally.  LABORATORY DATA:  I have reviewed the data as listed    Component Value Date/Time   NA 135 07/18/2023 1055   NA 126 (A) 06/28/2023 0000   K 3.5 07/18/2023 1055   CL 98 07/18/2023 1055   CO2 30 07/18/2023 1055   GLUCOSE 133 (H) 07/18/2023 1055   BUN 8 07/18/2023 1055   BUN 11 06/28/2023 0000   CREATININE 0.89 07/18/2023 1055   CREATININE 1.49 (H) 06/06/2020 1022   CALCIUM 9.8 07/18/2023 1055   PROT 7.3 07/18/2023 1055   PROT 6.9 02/20/2021 0920   ALBUMIN 4.5 07/18/2023 1055   ALBUMIN 4.6 02/20/2021 0920   AST 15 07/18/2023 1055   ALT 11 07/18/2023 1055   ALKPHOS 89 07/18/2023 1055   BILITOT 1.0 07/18/2023 1055   GFRNONAA >60 07/18/2023 1055   GFRAA 53 05/10/2020 0000    No results found for: "SPEP", "UPEP"  Lab Results  Component Value Date   WBC 5.9 07/18/2023   NEUTROABS 4.6 07/18/2023   HGB 12.1 (L) 07/18/2023   HCT 35.6 (L) 07/18/2023   MCV 103.5 (H) 07/18/2023   PLT 217 07/18/2023      Chemistry      Component Value Date/Time   NA 135 07/18/2023 1055   NA 126 (A) 06/28/2023 0000   K 3.5 07/18/2023 1055   CL 98 07/18/2023 1055   CO2 30  07/18/2023 1055   BUN 8 07/18/2023 1055   BUN 11 06/28/2023 0000   CREATININE 0.89 07/18/2023 1055   CREATININE 1.49 (H) 06/06/2020 1022   GLU 103 06/28/2023 0000      Component Value Date/Time   CALCIUM 9.8 07/18/2023 1055   ALKPHOS 89 07/18/2023 1055   AST 15 07/18/2023 1055   ALT 11 07/18/2023 1055   BILITOT 1.0 07/18/2023 1055       RADIOGRAPHIC STUDIES: I have personally reviewed the radiological images as listed and agreed with the findings in the report. No results found.

## 2023-08-06 NOTE — Assessment & Plan Note (Signed)
Will repeat PET whole body as scheduled Given new brain met, will refer for SRS first before starting immunotherapy.   Plan to start nivolumab after 1/1

## 2023-08-06 NOTE — Telephone Encounter (Signed)
Per secure chat patient is aware of rescheduled appointment times/dates for follow up

## 2023-08-07 ENCOUNTER — Encounter (HOSPITAL_COMMUNITY): Payer: Self-pay

## 2023-08-07 ENCOUNTER — Ambulatory Visit (HOSPITAL_COMMUNITY): Payer: Medicare HMO

## 2023-08-08 ENCOUNTER — Other Ambulatory Visit: Payer: Self-pay | Admitting: Radiation Therapy

## 2023-08-08 ENCOUNTER — Inpatient Hospital Stay: Payer: Medicare HMO

## 2023-08-08 ENCOUNTER — Inpatient Hospital Stay
Admission: RE | Admit: 2023-08-08 | Discharge: 2023-08-08 | Disposition: A | Discharge status: Home / Self Care | Payer: Self-pay | Source: Ambulatory Visit | Attending: Radiation Oncology | Admitting: Radiation Oncology

## 2023-08-08 ENCOUNTER — Other Ambulatory Visit: Payer: Self-pay

## 2023-08-08 VITALS — BP 150/79 | HR 99 | Temp 97.9°F | Resp 20 | Wt 178.6 lb

## 2023-08-08 DIAGNOSIS — C434 Malignant melanoma of scalp and neck: Secondary | ICD-10-CM | POA: Insufficient documentation

## 2023-08-08 DIAGNOSIS — Z79899 Other long term (current) drug therapy: Secondary | ICD-10-CM | POA: Insufficient documentation

## 2023-08-08 DIAGNOSIS — C438 Malignant melanoma of overlapping sites of skin: Secondary | ICD-10-CM

## 2023-08-08 DIAGNOSIS — I2510 Atherosclerotic heart disease of native coronary artery without angina pectoris: Secondary | ICD-10-CM | POA: Diagnosis not present

## 2023-08-08 DIAGNOSIS — Z7901 Long term (current) use of anticoagulants: Secondary | ICD-10-CM | POA: Insufficient documentation

## 2023-08-08 DIAGNOSIS — E785 Hyperlipidemia, unspecified: Secondary | ICD-10-CM | POA: Insufficient documentation

## 2023-08-08 DIAGNOSIS — I10 Essential (primary) hypertension: Secondary | ICD-10-CM | POA: Insufficient documentation

## 2023-08-08 DIAGNOSIS — C7800 Secondary malignant neoplasm of unspecified lung: Secondary | ICD-10-CM | POA: Diagnosis not present

## 2023-08-08 DIAGNOSIS — C7931 Secondary malignant neoplasm of brain: Secondary | ICD-10-CM

## 2023-08-08 DIAGNOSIS — I4891 Unspecified atrial fibrillation: Secondary | ICD-10-CM | POA: Diagnosis not present

## 2023-08-08 DIAGNOSIS — Z887 Allergy status to serum and vaccine status: Secondary | ICD-10-CM | POA: Diagnosis not present

## 2023-08-08 MED ORDER — PROCHLORPERAZINE MALEATE 10 MG PO TABS: 10.0000 mg | ORAL_TABLET | Freq: Four times a day (QID) | ORAL | 1 refills | Status: DC | PRN

## 2023-08-08 MED ORDER — ONDANSETRON HCL 8 MG PO TABS: 8.0000 mg | ORAL_TABLET | Freq: Three times a day (TID) | ORAL | 1 refills | Status: DC | PRN

## 2023-08-09 ENCOUNTER — Other Ambulatory Visit: Payer: Medicare HMO

## 2023-08-09 ENCOUNTER — Ambulatory Visit: Payer: Medicare HMO

## 2023-08-09 ENCOUNTER — Inpatient Hospital Stay: Payer: Medicare HMO

## 2023-08-11 ENCOUNTER — Encounter: Payer: Self-pay | Admitting: Urology

## 2023-08-11 ENCOUNTER — Ambulatory Visit (HOSPITAL_COMMUNITY)
Admission: RE | Admit: 2023-08-11 | Discharge: 2023-08-11 | Disposition: A | Discharge status: Home / Self Care | Payer: Medicare HMO | Source: Ambulatory Visit | Attending: Radiation Oncology | Admitting: Radiation Oncology

## 2023-08-11 DIAGNOSIS — C7931 Secondary malignant neoplasm of brain: Secondary | ICD-10-CM | POA: Diagnosis not present

## 2023-08-11 DIAGNOSIS — I6782 Cerebral ischemia: Secondary | ICD-10-CM | POA: Diagnosis not present

## 2023-08-11 MED ORDER — GADOBUTROL 1 MMOL/ML IV SOLN
8.0000 mL | Freq: Once | INTRAVENOUS | Status: AC | PRN
  Administered 2023-08-11: 8 mL via INTRAVENOUS

## 2023-08-12 ENCOUNTER — Ambulatory Visit: Payer: Medicare HMO

## 2023-08-12 ENCOUNTER — Ambulatory Visit
Admission: RE | Admit: 2023-08-12 | Discharge: 2023-08-12 | Disposition: A | Discharge status: Home / Self Care | Payer: Medicare HMO | Source: Ambulatory Visit | Attending: Radiation Oncology | Admitting: Radiation Oncology

## 2023-08-12 ENCOUNTER — Other Ambulatory Visit: Payer: Self-pay

## 2023-08-12 ENCOUNTER — Encounter: Payer: Self-pay | Admitting: Radiation Oncology

## 2023-08-12 ENCOUNTER — Ambulatory Visit: Payer: Medicare HMO | Admitting: Radiation Oncology

## 2023-08-12 VITALS — BP 144/86 | HR 80 | Temp 97.7°F | Resp 18 | Ht 70.5 in | Wt 175.0 lb

## 2023-08-12 DIAGNOSIS — C7931 Secondary malignant neoplasm of brain: Secondary | ICD-10-CM | POA: Insufficient documentation

## 2023-08-12 DIAGNOSIS — C7800 Secondary malignant neoplasm of unspecified lung: Secondary | ICD-10-CM | POA: Insufficient documentation

## 2023-08-12 DIAGNOSIS — C434 Malignant melanoma of scalp and neck: Secondary | ICD-10-CM | POA: Diagnosis not present

## 2023-08-12 MED ORDER — SODIUM CHLORIDE 0.9% FLUSH
10.0000 mL | INTRAVENOUS | Status: DC | PRN
  Administered 2023-08-12: 10 mL via INTRAVENOUS

## 2023-08-12 NOTE — Progress Notes (Signed)
  Radiation Oncology         [phone]) [phone] ________________________________  Name: Nicholas Bird MRN: [id number]  Date: 08/12/2023  DOB: [birth year]  SIMULATION AND TREATMENT PLANNING NOTE    ICD-10-CM   1. Malignant melanoma metastatic to brain [facility])  C79.31       DIAGNOSIS:  82 year old man with 4 subcentimeter brain metastases secondary to stage IV malignant melanoma  NARRATIVE:  The patient was brought to the CT Simulation planning suite.  Identity was confirmed.  All relevant records and images related to the planned course of therapy were reviewed.  The patient freely provided informed written consent to proceed with treatment after reviewing the details related to the planned course of therapy. The consent form was witnessed and verified by the simulation staff. Intravenous access was established for contrast administration. Then, the patient was set-up in a stable reproducible supine position for radiation therapy.  A relocatable thermoplastic stereotactic head frame was fabricated for precise immobilization.  CT images were obtained.  Surface markings were placed.  The CT images were loaded into the planning software and fused with the patient's targeting MRI scan.  Then the target and avoidance structures were contoured.  Treatment planning then occurred.  The radiation prescription was entered and confirmed.  I have requested 3D planning  I have requested a DVH of the following structures: Brain stem, brain, left eye, right eye, lenses, optic chiasm, target volumes, uninvolved brain, and normal tissue.    SPECIAL TREATMENT PROCEDURE:  The planned course of therapy using radiation constitutes a special treatment procedure. Special care is required in the management of this patient for the following reasons. This treatment constitutes a Special Treatment Procedure for the following reason: High dose per fraction requiring special monitoring for increased toxicities of treatment including daily  imaging.  The special nature of the planned course of radiotherapy will require increased physician supervision and oversight to ensure patient's safety with optimal treatment outcomes.  This requires extended time and effort.  PLAN:  The patient will receive 20 Gy in 1 fraction to the four targets.  ________________________________  Artist Pais Kathrynn Running, M.D.

## 2023-08-12 NOTE — Progress Notes (Signed)
Radiation Oncology         [phone] ________________________________  Initial outpatient Consultation - Conducted via telephone due to current COVID-19 concerns for limiting patient exposure- CT SIMULATION SAME DAY  Name: Nicholas Bird MRN: [id number]  Date of Service: 08/12/2023 DOB: 08/15/1941  [id number], Katina Degree, MD  Melven Sartorius, MD   REFERRING PHYSICIAN: Melven Sartorius, MD  DIAGNOSIS: 82 year old man with brain metastases secondary to stage IV malignant melanoma    ICD-10-CM   1. Malignant melanoma metastatic to brain [facility])  C79.31       HISTORY OF PRESENT ILLNESS: Nicholas Bird is a 82 y.o. male seen at the request of Dr. Cherly Hensen. He was initially diagnosed with T2a malignant melanoma of the scalp on biopsy with his dermatologist, Dr. Donzetta Starch, on 12/14/14. This was followed by wide local excision and lymph node biopsies on 12/31/14, under the care of Dr. Lucretia Roers at [facility], with pathology confirming residual malignant melanoma with negative margins and 5 negative lymph nodes (0/5). He continued in close follow up under the care of Dr. Dan Europe at [facility] and remained disease-free through 2020. However, in 2021, he was found to have a new scalp lesion, and biopsy on 04/14/20 confirmed recurrent malignant melanoma. While staging scans were negative for any metastatic disease, he was treated with 5 cycles of TVEC from 05/25/20 through 07/25/20, under the care of Dr. Tama Gander. He had a second recurrence of the scalp on 01/23/22 and was retreated with TVEC from 02/21/22 through 05/09/22. Unfortunately, on surveillance PET scan on 05/13/23, he was found to have pulmonary metastatic disease in the right upper lobe. He was referred to Dr. Dortha Schwalbe and underwent RUL lobectomy on 06/14/23. Pathology from the procedure confirmed multifocal metastatic melanoma, with negative margins and two negative lymph nodes. He was subsequently referred to Dr. Cherly Hensen here in medical oncology who recommended getting  updated disease restaging with PET scan and MRI brain scans prior to starting systemic immunotherapy with nivolumab in the near future.  A staging brain MRI on 08/05/23 showed a solitary 7 mm right occipital metastasis and a 7 mm enhancing lesion in the right posterior parietal bone.  The lesion in the right posterior parietal bone was not avid on PET/CT scan and September 2024 so the recommendation was to continue to monitor this.  A 3T SRS planning MRI brain scan was performed on 08/11/2023 and confirms a total of 4 metastatic lesions- -3 mm contrast-enhancing cerebellar vermis (image 85). -6 mm contrast-enhancing lesion in the right occipital lobe (image 125) -3 mm contrast-enhancing lesion in the right temporal lobe (image 124) -2 mm contrast-enhancing lesion in the left parietal lobe (image 192)  He has been kindly referred to [place] today to discuss the potential role for radiotherapy in the management of metastatic brain disease.  PREVIOUS RADIATION THERAPY: No  PAST MEDICAL HISTORY:  Past Medical History:  Diagnosis Date   Anticoagulant long-term use    Atrial fibrillation, permanent (HCC)    CARIOLOGIST-  DR Graciela Husbands   Bladder diverticulum    BPH (benign prostatic hypertrophy)    ED (erectile dysfunction) of organic origin    Elevated LFTs    CHRONIC   Gall stones    Gout    per pt 12-09-2013  gout is stable   Hematuria    History of pancreatitis    10/2012--  resolved    History of TIA (transient ischemic attack)    04/2009--  no residual   Hyperlipemia  Hypertension    Liver disease    Melanoma (HCC)    Personal history of colonic polyps 07/12/2008   TUBULAR ADENOMA   Rosacea       PAST SURGICAL HISTORY: Past Surgical History:  Procedure Laterality Date   CARDIAC CATHETERIZATION  09-19-1998   false positive stress test---  normal cath and preserved lvf   CATARACT EXTRACTION W/ INTRAOCULAR LENS  IMPLANT, BILATERAL Bilateral 2008   CYSTOSCOPY WITH BIOPSY N/A  12/14/2013   Procedure: CYSTOSCOPY WITH BIOPSY;  Surgeon: Marcine Matar, MD;  [facility];  Service: Urology;  Laterality: N/A;   INGUINAL HERNIA REPAIR Left 01-14-2001   LAPAROSCOPIC CHOLECYSTECTOMY  10-21-2008   AND LIVER BX'S   LAPAROSCOPIC INGUINAL HERNIA REPAIR Bilateral 05-22-2009    FAMILY HISTORY:  Family History  Problem Relation Age of Onset   Lung cancer Mother    Skin cancer Sister    Colon cancer Neg Hx    Esophageal cancer Neg Hx    Pancreatic cancer Neg Hx    Stomach cancer Neg Hx    Rectal cancer Neg Hx     SOCIAL HISTORY:  Social History   Socioeconomic History   Marital status: Married    Spouse name: Not on file   Number of children: 2   Years of education: Not on file   Highest education level: Bachelor's degree (e.g., BA, AB, BS)  Occupational History   Occupation: retired    Associate Professor: RETIRED  Tobacco Use   Smoking status: Never   Smokeless tobacco: Never  Vaping Use   Vaping status: Never Used  Substance and Sexual Activity   Alcohol use: Yes    Alcohol/week: 14.0 standard drinks of alcohol    Types: 14 Shots of liquor per week    Comment: 3 oz. per day   Drug use: No   Sexual activity: Yes    Partners: Female  Other Topics Concern   Not on file  Social History Narrative   Not on file   Social Determinants of Health   Financial Resource Strain: Low Risk  (06/20/2023)   Received from [facility]   Overall Financial Resource Strain (CARDIA)    Difficulty of Paying Living Expenses: Not hard at all  Food Insecurity: No Food Insecurity (06/30/2023)   Hunger Vital Sign    Worried About Running Out of Food in the Last Year: Never true    Ran Out of Food in the Last Year: Never true  Transportation Needs: No Transportation Needs (06/30/2023)   PRAPARE - Administrator, Civil Service (Medical): No    Lack of Transportation (Non-Medical): No  Physical Activity: Sufficiently Active (02/16/2022)    Exercise Vital Sign    Days of Exercise per Week: 6 days    Minutes of Exercise per Session: 30 min  Stress: Stress Concern Present (06/30/2023)   [employer] of Occupational Health - Occupational Stress Questionnaire    Feeling of Stress : Rather much  Social Connections: Socially Integrated (06/30/2023)   Social Connection and Isolation Panel [NHANES]    Frequency of Communication with Friends and Family: More than three times a week    Frequency of Social Gatherings with Friends and Family: Three times a week    Attends Religious Services: More than 4 times per year    Active Member of Clubs or Organizations: Yes    Attends Banker Meetings: More than 4 times per year    Marital Status: Married  Intimate Partner Violence: Not At Risk (02/16/2022)   Humiliation, Afraid, Rape, and Kick questionnaire    Fear of Current or Ex-Partner: No    Emotionally Abused: No    Physically Abused: No    Sexually Abused: No    ALLERGIES: Pertussis vaccines  MEDICATIONS:  Current Outpatient Medications  Medication Sig Dispense Refill   allopurinol (ZYLOPRIM) 300 MG tablet TAKE 1 TABLET BY MOUTH EVERY DAY 90 tablet 0   amLODipine (NORVASC) 5 MG tablet Take 0.5 tablets (2.5 mg total) by mouth daily.     ampicillin (PRINCIPEN) 500 MG capsule Take 500 mg by mouth 2 (two) times daily as needed. (Patient not taking: Reported on 07/04/2023)     apixaban (ELIQUIS) 5 MG TABS tablet Take 1 tablet (5 mg total) by mouth 2 (two) times daily. 60 tablet 5   ferrous sulfate 325 (65 FE) MG tablet Take 325 mg by mouth daily.     finasteride (PROSCAR) 5 MG tablet Take 5 mg by mouth.     furosemide (LASIX) 40 MG tablet Take 0.5 tablets (20 mg total) by mouth daily.     Melatonin 10 MG CAPS Take 10 mg nightly 30 capsule 5   metoprolol succinate (TOPROL-XL) 50 MG 24 hr tablet Take 1 tablet (50 mg total) by mouth daily. Take with or immediately following a meal. 90 tablet 3   ondansetron (ZOFRAN) 8 MG  tablet Take 1 tablet (8 mg total) by mouth every 8 (eight) hours as needed for nausea or vomiting. 30 tablet 1   prochlorperazine (COMPAZINE) 10 MG tablet Take 1 tablet (10 mg total) by mouth every 6 (six) hours as needed for nausea or vomiting. 30 tablet 1   tamsulosin (FLOMAX) 0.4 MG CAPS capsule Take 1 capsule (0.4 mg total) by mouth daily. 30 capsule 0   valsartan (DIOVAN) 80 MG tablet Take 1 tablet (80 mg total) by mouth daily. 90 tablet 1   No current facility-administered medications for this encounter.    REVIEW OF SYSTEMS:  On review of systems, the patient reports that he is doing well overall. He denies any chest pain, shortness of breath, cough, fevers, chills, night sweats, or unintended weight changes.  He denies dizziness, headaches, changes in visual or auditory acuity, imbalance or seizure activity.  He denies any bowel or bladder disturbances, and denies abdominal pain, nausea or vomiting. He denies any new musculoskeletal or joint aches or pains. A complete review of systems is obtained and is otherwise negative.    PHYSICAL EXAM:  Wt Readings from Last 3 Encounters:  08/08/23 178 lb 9.6 oz (81 kg)  07/18/23 181 lb 6 oz (82.3 kg)  07/04/23 180 lb 12.8 oz (82 kg)   Temp Readings from Last 3 Encounters:  08/08/23 97.9 F (36.6 C)  07/18/23 (!) 97.5 F (36.4 C) (Temporal)  07/04/23 97.8 F (36.6 C) (Temporal)   BP Readings from Last 3 Encounters:  08/08/23 (!) 150/79  07/18/23 (!) 144/90  07/04/23 136/75   Pulse Readings from Last 3 Encounters:  08/08/23 99  07/18/23 76  07/04/23 87   Pain Assessment Pain Score: 0-No pain/10  Physical exam not performed in light of telephone consult visit format.   KPS = 100  100 - Normal; no complaints; no evidence of disease. 90   - Able to carry on normal activity; minor signs or symptoms of disease. 80   - Normal activity with effort; some signs or symptoms of disease. 77   - Cares for self;  unable to carry on normal  activity or to do active work. 60   - Requires occasional assistance, but is able to care for most of his personal needs. 50   - Requires considerable assistance and frequent medical care. 40   - Disabled; requires special care and assistance. 30   - Severely disabled; hospital admission is indicated although death not imminent. 20   - Very sick; hospital admission necessary; active supportive treatment necessary. 10   - Moribund; fatal processes progressing rapidly. 0     - Dead  Karnofsky DA, Abelmann WH, Craver LS and Burchenal [facility] [phone]) The use of the nitrogen mustards in the palliative treatment of carcinoma: with particular reference to bronchogenic carcinoma Cancer 1 634-56  LABORATORY DATA:  Lab Results  Component Value Date   WBC 5.9 07/18/2023   HGB 12.1 (L) 07/18/2023   HCT 35.6 (L) 07/18/2023   MCV 103.5 (H) 07/18/2023   PLT 217 07/18/2023   Lab Results  Component Value Date   NA 135 07/18/2023   K 3.5 07/18/2023   CL 98 07/18/2023   CO2 30 07/18/2023   Lab Results  Component Value Date   ALT 11 07/18/2023   AST 15 07/18/2023   ALKPHOS 89 07/18/2023   BILITOT 1.0 07/18/2023     RADIOGRAPHY: MR Brain W Wo Contrast  Result Date: 08/11/2023 CLINICAL DATA:  Metastatic disease evaluation 7mm lesion seen on [facility] brain MRI- requesting 3T SRS Protocol for radiation treatment planning EXAM: MRI HEAD WITHOUT AND WITH CONTRAST TECHNIQUE: Multiplanar, multiecho pulse sequences of the brain and surrounding structures were obtained without and with intravenous contrast. CONTRAST:  8mL GADAVIST GADOBUTROL 1 MMOL/ML IV SOLN COMPARISON:  None Available. FINDINGS: Brain: Negative for an acute infarct. No hemorrhage. No hydrocephalus. No extra-axial fluid collection. No mass effect. No mass lesion. There is a background of mild chronic microvascular ischemic change. There are multiple intracranial contrast-enhancing lesions (all lesions seen on series 1100), including: -3 mm  contrast-enhancing cerebellar vermis (image 85). -6 mm contrast-enhancing lesion in the right occipital lobe (image 125) -3 mm contrast-enhancing lesion in the right temporal lobe (image 124) -2 mm contrast-enhancing lesion in the left parietal lobe (image 192) - Vascular: Normal flow voids. Skull and upper cervical spine: Normal marrow signal. Sinuses/Orbits: No middle ear or mastoid effusion. Polypoid mucosal thickening in the left maxillary sinus. Bilateral lens replacement. Orbits are otherwise unremarkable. Other: None. IMPRESSION: 1.  No acute intracranial abnormality. 2. Contrast-enhancing lesions in the cerebellar vermis, right occipital lobe, right temporal lobe, and left parietal lobe, as above, compatible with metastatic disease. Electronically Signed   By: Lorenza Cambridge M.D.   On: 08/11/2023 15:31      IMPRESSION/PLAN: This visit was conducted via telephone to spare the patient unnecessary potential exposure in the healthcare setting during the current COVID-19 pandemic. 1. 82 y.o. man with brain metastases secondary to stage IV malignant melanoma  At this point, the patient would potentially benefit from radiotherapy. The options include whole brain irradiation versus stereotactic radiosurgery. There are pros and cons associated with each of these potential treatment options. Whole brain radiotherapy would treat the known metastatic deposits and help provide some reduction of risk for future brain metastases. However, whole brain radiotherapy carries potential risks including hair loss, subacute somnolence, and neurocognitive changes including a possible reduction in short-term memory. Whole brain radiotherapy also may carry a lower likelihood of tumor control at the treatment sites because of the low-dose used. Stereotactic radiosurgery carries a  higher likelihood for local tumor control at the targeted sites with lower associated risk for neurocognitive changes such as memory loss. However, the  use of stereotactic radiosurgery in this setting may leave the patient at increased risk for new brain metastases elsewhere in the brain as high as 50-60%. Accordingly, patients who receive stereotactic radiosurgery in this setting should undergo ongoing surveillance imaging with brain MRI more frequently in order to identify and treat new small brain metastases before they become symptomatic. Stereotactic radiosurgery does carry some different risks, including a risk of radionecrosis.  PLAN: Today, I reviewed the findings and workup thus far with the patient and his wife. We discussed the dilemma regarding whole brain radiotherapy versus stereotactic radiosurgery. We discussed the pros and cons of each. We also discussed the logistics and delivery of each. We reviewed the results associated with each of the treatments described above. The patient seems to understand the treatment options and would like to proceed with stereotactic radiosurgery.  He is tentatively scheduled for CT simulation/treatment planning this afternoon at 2:30 PM, in anticipation of proceeding with his treatment in the near future, in collaboration with one of our local neurosurgeons.  He has provided verbal consent to proceed today in the office and will sign formal written consent when he comes for simulation this afternoon.  We will share our discussion with Dr. Cherly Hensen and his team at [facility], to keep everyone informed of our plan.  The patient and his wife know that they are welcome to call at anytime in the interim with any further questions or concerns related to our discussion today.  We enjoyed meeting him today and look forward to continuing to participate in his care.   Given current concerns for patient exposure during the COVID-19 pandemic, this encounter was conducted via telephone. The patient was notified in advance and was offered a WebEX meeting to allow for face to face communication but unfortunately reported that he  did not have the appropriate resources/technology to support such a visit and instead preferred to proceed with telephone consult. The patient has given verbal consent for this type of encounter. The attendants for this meeting include Margaretmary Dys MD, Ashlyn Bruning PA-C, Katie Daubenspeck- scribe, patient Nicholas Bird and his wife, Tamela Oddi. During the encounter, Margaretmary Dys MD, Ashlyn Bruning PA-C, and scribe, Mickie Bail were located at [facility] Radiation Oncology Department.  Patient Nicholas Bird and his wife, Tamela Oddi, were were located at home.  We personally spent 60 minutes in this encounter including chart review, reviewing radiological studies, telephone conversation with the patient, entering orders, coordinating care and completing documentation.    Marguarite Arbour, PA-C    Margaretmary Dys, MD  [facility]  Radiation Oncology Direct Dial: [phone]  Fax: [phone] [email or web address]  Skype  [email or web address]   This document serves as a record of services personally performed by Margaretmary Dys, MD and Marcello Fennel, PA-C. It was created on their behalf by Mickie Bail, a trained medical scribe. The creation of this record is based on the scribe's personal observations and the provider's statements to them. This document has been checked and approved by the attending provider.

## 2023-08-12 NOTE — Progress Notes (Signed)
Location/Histology of Brain Tumor: Melanoma metastatic to brain  Patient presented with symptoms of:  Referral from Dr. Geanie Berlin   08/11/2023 Dr. Margaretmary Dys MR Brain with/without Contrast CLINICAL DATA:  Metastatic disease evaluation 7mm lesion seen on [facility] brain MRI- requesting 3T SRS Protocol for radiation treatment planning  IMPRESSION: 1.  No acute intracranial abnormality. 2. Contrast-enhancing lesions in the cerebellar vermis, right occipital lobe, right temporal lobe, and left parietal lobe, as above, compatible with metastatic disease.    Past or anticipated interventions, if any, per neurosurgery: NA  Past or anticipated interventions, if any, per medical oncology:  08/08/2023 Dr. Geanie Berlin    Dose of Decadron, if applicable: No  Recent neurologic symptoms, if any:  Seizures: No Headaches: No Nausea: No Dizziness/ataxia: No Difficulty with hand coordination: No Focal numbness/weakness: No Visual deficits/changes: No Confusion/Memory deficits: No  Painful bone metastases at present, if any: No  SAFETY ISSUES: Prior radiation?  No Pacemaker/ICD? No Possible current pregnancy? Male Is the patient on methotrexate?  No  Additional Complaints / other details:

## 2023-08-12 NOTE — Progress Notes (Signed)
Has armband been applied?  Yes.    Does patient have an allergy to IV contrast dye?: No.   Has patient ever received premedication for IV contrast dye?: No.   Does patient take metformin?: No.  If patient does take metformin when was the last dose: None  Date of lab work: July 18, 2023 BUN: 8 CR: 0.89 eGfr: >60  IV site: antecubital right, condition patent and no redness  Has IV site been added to flowsheet?  Yes.    BP (!) 144/86 (BP Location: Right Arm, Patient Position: Sitting, Cuff Size: Normal)   Pulse 80   Temp 97.7 F (36.5 C)   Resp 18   Ht 5' 10.5" (1.791 m)   Wt 175 lb (79.4 kg)   SpO2 98%   BMI 24.75 kg/m   This concludes the interaction.  Ruel Favors, LPN

## 2023-08-13 ENCOUNTER — Encounter (HOSPITAL_COMMUNITY): Admission: RE | Admit: 2023-08-13 | Payer: Medicare HMO | Source: Ambulatory Visit

## 2023-08-13 DIAGNOSIS — C434 Malignant melanoma of scalp and neck: Secondary | ICD-10-CM | POA: Diagnosis not present

## 2023-08-13 DIAGNOSIS — C7800 Secondary malignant neoplasm of unspecified lung: Secondary | ICD-10-CM | POA: Diagnosis not present

## 2023-08-13 DIAGNOSIS — C7931 Secondary malignant neoplasm of brain: Secondary | ICD-10-CM | POA: Diagnosis not present

## 2023-08-16 ENCOUNTER — Encounter (HOSPITAL_COMMUNITY)
Admission: RE | Admit: 2023-08-16 | Discharge: 2023-08-16 | Disposition: A | Discharge status: Home / Self Care | Payer: Medicare HMO | Source: Ambulatory Visit

## 2023-08-16 DIAGNOSIS — C438 Malignant melanoma of overlapping sites of skin: Secondary | ICD-10-CM | POA: Diagnosis not present

## 2023-08-16 DIAGNOSIS — R932 Abnormal findings on diagnostic imaging of liver and biliary tract: Secondary | ICD-10-CM | POA: Diagnosis not present

## 2023-08-16 DIAGNOSIS — C7951 Secondary malignant neoplasm of bone: Secondary | ICD-10-CM | POA: Diagnosis not present

## 2023-08-16 LAB — GLUCOSE, CAPILLARY: Glucose-Capillary: 133 mg/dL — ABNORMAL HIGH (ref 70–99)

## 2023-08-16 MED ORDER — FLUDEOXYGLUCOSE F - 18 (FDG) INJECTION
8.6000 | Freq: Once | INTRAVENOUS | Status: AC
  Administered 2023-08-16: 8.69 via INTRAVENOUS

## 2023-08-19 ENCOUNTER — Other Ambulatory Visit: Payer: Self-pay

## 2023-08-19 ENCOUNTER — Ambulatory Visit
Admission: RE | Admit: 2023-08-19 | Discharge: 2023-08-19 | Disposition: A | Discharge status: Home / Self Care | Payer: Medicare HMO | Source: Ambulatory Visit | Attending: Radiation Oncology | Admitting: Radiation Oncology

## 2023-08-19 DIAGNOSIS — Z510 Encounter for antineoplastic radiation therapy: Secondary | ICD-10-CM | POA: Diagnosis not present

## 2023-08-19 DIAGNOSIS — C434 Malignant melanoma of scalp and neck: Secondary | ICD-10-CM | POA: Diagnosis not present

## 2023-08-19 DIAGNOSIS — C7800 Secondary malignant neoplasm of unspecified lung: Secondary | ICD-10-CM | POA: Diagnosis not present

## 2023-08-19 DIAGNOSIS — C7931 Secondary malignant neoplasm of brain: Secondary | ICD-10-CM

## 2023-08-19 LAB — RAD ONC ARIA SESSION SUMMARY
Course Elapsed Days: 0
Plan Fractions Treated to Date: 1
Plan Prescribed Dose Per Fraction: 20 Gy
Plan Total Fractions Prescribed: 1
Plan Total Prescribed Dose: 20 Gy
Reference Point Dosage Given to Date: 20 Gy
Reference Point Session Dosage Given: 20 Gy
Session Number: 1

## 2023-08-19 NOTE — Progress Notes (Signed)
Patient to nursing for 30 minutes observation SRS Brain.  Denies headache, fatigue, ringing in ears, visual changes, nausea, or skin irritation at this time.  Independently walked out of clinic without difficulty.  No Decadron orders.  Vitals:  96.9-65-18-140/66 O2 sat 100% RA.  Advised not to do anything in a 24 hours period and to call [phone] if need.

## 2023-08-20 NOTE — Radiation Completion Notes (Signed)
Patient Name: Nicholas Bird, Nicholas Bird MRN: [id number] Date of Birth: 10/20/1940 Referring Physician: Geanie Berlin, M.D. Date of Service: 2023-08-20 Radiation Oncologist: Margaretmary Bayley, M.D. [facility]                             RADIATION ONCOLOGY END OF TREATMENT NOTE     Diagnosis: C79.31 Secondary malignant neoplasm of brain Intent: Palliative     ==========DELIVERED PLANS==========  First Treatment Date: 2023-08-19 Last Treatment Date: 2023-08-19   Plan Name: Brain_SRS Site: Brain Technique: SBRT/SRT-IMRT Mode: Photon Dose Per Fraction: 20 Gy Prescribed Dose (Delivered / Prescribed): 20 Gy / 20 Gy Prescribed Fxs (Delivered / Prescribed): 1 / 1     ==========ON TREATMENT VISIT DATES========== 2023-08-19     ==========UPCOMING VISITS========== 09/24/2023 [facility]-RADIATION ONC POST TREATMENT CALL [facility]POST TREATMENT  09/20/2023 [facility]-MED ONCOLOGY NUT 45 Noreene Larsson, [place]  09/20/2023 [facility]-MED ONCOLOGY INFUSION 1HR30MIN (90) [facility]MEDONC INFUSION  09/20/2023 [facility]-MED ONCOLOGY EST PT 30 Melven Sartorius, [place]  09/20/2023 [facility]MED ONCOLOGY LAB [facility]-ONC LAB  09/06/2023 [facility]-MED ONCOLOGY EST PT 30 Melven Sartorius, MD  09/06/2023 [facility]-MED ONCOLOGY INFUSION 2HR30MIN (150) [facility]MED[facility] INFUSION  09/06/2023 [facility]-MED ONCOLOGY LAB [facility]-ONC LAB        ==========APPENDIX - ON TREATMENT VISIT NOTES==========   See weekly On Treatment Notes in Epic for details in the Media tab (listed as Progress notes on the On Treatment Visit Dates listed above).

## 2023-08-20 NOTE — Progress Notes (Signed)
  Radiation Oncology         [phone]) [phone] ________________________________  Stereotactic Treatment Procedure Note  Name: Nicholas Bird MRN: [id number]  Date: 08/19/2023  DOB: September 07, 1940  SPECIAL TREATMENT PROCEDURE    ICD-10-CM   1. Malignant melanoma metastatic to brain [facility])  C79.31       3D TREATMENT PLANNING AND DOSIMETRY:  The patient's radiation plan was reviewed and approved by neurosurgery and radiation oncology prior to treatment.  It showed 3-dimensional radiation distributions overlaid onto the planning CT/MRI image set.  The [facility] for the target structures as well as the organs at risk were reviewed. The documentation of the 3D plan and dosimetry are filed in the radiation oncology EMR.  NARRATIVE:  Nicholas Bird was brought to the TrueBeam stereotactic radiation treatment machine and placed supine on the CT couch. The head frame was applied, and the patient was set up for stereotactic radiosurgery.  Neurosurgery was present for the set-up and delivery  SIMULATION VERIFICATION:  In the couch zero-angle position, the patient underwent Exactrac imaging using the Brainlab system with orthogonal KV images.  These were carefully aligned and repeated to confirm treatment position for each of the isocenters.  The Exactrac snap film verification was repeated at each couch angle.  PROCEDURE: Nicholas Bird received stereotactic radiosurgery to the following targets:   These targets were treated using 5 Rapid Arc VMAT Beams to a prescription dose of 20 Gy.  ExacTrac registration was performed for each couch angle.  The 100% isodose line was prescribed.  6 MV X-rays were delivered in the flattening filter free beam mode.  STEREOTACTIC TREATMENT MANAGEMENT:  Following delivery, the patient was transported to nursing in stable condition and monitored for possible acute effects.  Vital signs were recorded . The patient tolerated treatment without significant acute effects, and was discharged to  home in stable condition.    PLAN: Follow-up in one month.  ________________________________  Artist Pais. Kathrynn Running, M.D.

## 2023-08-26 NOTE — Procedures (Signed)
  Name: Nicholas Bird  MRN: [id number]  Date: 08/19/2023   DOB: 1941/04/25  Stereotactic Radiosurgery Operative Note  PRE-OPERATIVE DIAGNOSIS:  Multiple Brain Metastases  POST-OPERATIVE DIAGNOSIS:  Multiple Brain Metastases  PROCEDURE:  Stereotactic Radiosurgery  SURGEON:  Mariam Dollar, MD  NARRATIVE: The patient underwent a radiation treatment planning session in the radiation oncology simulation suite under the care of the radiation oncology physician and physicist.  I participated closely in the radiation treatment planning afterwards. The patient underwent planning CT which was fused to 3T high resolution MRI with 1 mm axial slices.  These images were fused on the planning system.  We contoured the gross target volumes and subsequently expanded this to yield the Planning Target Volume. I actively participated in the planning process.  I helped to define and review the target contours and also the contours of the optic pathway, eyes, brainstem and selected nearby organs at risk.  All the dose constraints for critical structures were reviewed and compared to AAPM Task Group 101.  The prescription dose conformity was reviewed.  I approved the plan electronically.    Accordingly, [name] was brought to the TrueBeam stereotactic radiation treatment linac and placed in the custom immobilization mask.  The patient was aligned according to the IR fiducial markers with BrainLab Exactrac, then orthogonal x-rays were used in ExacTrac with the 6DOF robotic table and the shifts were made to align the patient  [name] received stereotactic radiosurgery uneventfully.    Lesions treated:  4   Complex lesions treated:  0 (>3.5 cm, <43mm of optic path, or within the brainstem)   The detailed description of the procedure is recorded in the radiation oncology procedure note.  I was present for the duration of the procedure.  DISPOSITION:  Following delivery, the patient was transported to nursing in  stable condition and monitored for possible acute effects to be discharged to home in stable condition with follow-up in one month.  Mariam Dollar, MD 08/26/2023 9:33 AM

## 2023-08-26 NOTE — Addendum Note (Signed)
Encounter addended by: Donalee Citrin, MD on: 08/26/2023 9:34 AM  Actions taken: Clinical Note Signed

## 2023-08-29 ENCOUNTER — Telehealth: Payer: Self-pay | Admitting: *Deleted

## 2023-08-29 NOTE — Telephone Encounter (Signed)
-----   Message from Melven Sartorius sent at 08/26/2023  9:29 PM EST ----- FYI. This is served as new baseline before starting immunotherapy. I will go over details on the follow up appointment already scheduled. Thank you

## 2023-08-29 NOTE — Telephone Encounter (Signed)
Notified of note below

## 2023-08-30 ENCOUNTER — Other Ambulatory Visit: Payer: Self-pay | Admitting: Internal Medicine

## 2023-08-30 ENCOUNTER — Other Ambulatory Visit: Payer: Self-pay | Admitting: Family Medicine

## 2023-09-05 NOTE — Progress Notes (Signed)
 Patient Care Team: [name], MD as PCP - General (Family Medicine) [name], MD as PCP - Electrophysiology (Cardiology) [name], MD (Cardiology) [name], MD as Attending Physician (Urology) [name], MD as Consulting Physician (Dermatology) [name], MD as Consulting Physician (Ophthalmology) [name], MD as Consulting Physician (Gastroenterology) [name], [facility] (Inactive) as Pharmacist (Pharmacist) [name], RN as [facility] Care Management  Clinic Day:  09/08/2023  Referring physician: [name], MD  ASSESSMENT & PLAN:   Assessment & Plan: Nicholas Bird is a 83 y.o.male with history of atrial fibrillation on anticoagulation, hypertension, hyperlipidemia being [facility].    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  Suspicious for bone metastases. BRAF negative. Initial diagnosis: 2016 melanoma of scalp [place] is a 83 y.o.male with history of atrial fibrillation on anticoagulation, hypertension, hyperlipidemia being [facility] for cutaneous melanoma.  Initially seen after right UL lobectomy with plan to start immunotherapy for stage 4 melanoma.  During restaging workup in December found to have solitary brain metastasis.  He has now completed SRS.  Discussed disease progression with him today.  Discussed with patient and wife on the PET today with multiple sites of metastases.  Recommend proceed with immunotherapy.    Side effects of immunotherapy are mostly related to immune related reaction.  They depend on which organ may be affected.  Patient can have hyper or hypothyroidism, skin rash, fever, pneumonitis, hepatitis, carditis, colitis, nephritis or other endorgan damage from immune related reaction.  Severe fatal reaction such as carditis or encephalitis has been reported. Rarely severe side  effects can result in death.  After discussion Nicholas Bird understands and would like to proceed.  Current Diagnosis: Metastatic melanoma with lung and brain metastases, live and bone metastases. BRAF negative. Initial diagnosis: 2016 melanoma of scalp Previous treatment: SRS to brain metastases  Malignant melanoma metastatic to brain [facility]) Completed SRS Start nivolumab  today  Malignant melanoma Plan to start nivolumab  today on 1/3  At risk for side effect of medication Monitor with CBC, CMP each visit for anemia related toxicity.      The patient understands the plans discussed today and is in agreement with them.  He knows to contact our office if he develops concerns prior to his next appointment.  [name], MD  [facility] - A DEPT OF [facility] [address] [place] [address] Dept: [phone] Dept Fax: [phone]   No orders of the defined types were placed in this encounter.     CHIEF COMPLAINT:  CC: stage IV melanoma  Current Treatment:  nivolumab   INTERVAL HISTORY:  Nicholas Bird is here today for repeat clinical assessment. He denies changes. No bone pain, back pain, rib pain or any pain.  Overall doing well.  No headaches.  I have reviewed the past medical history, past surgical history, social history and family history with the patient and they are unchanged from previous note.  ALLERGIES:  is allergic to pertussis vaccines.  MEDICATIONS:  Current Outpatient Medications  Medication Sig Dispense Refill   allopurinol  (ZYLOPRIM ) 300 MG tablet TAKE 1 TABLET BY MOUTH EVERY DAY 90 tablet 0   amLODipine  (NORVASC ) 5 MG tablet Take 0.5 tablets (2.5 mg total) by mouth daily.     ampicillin (PRINCIPEN) 500 MG capsule Take 500 mg by mouth  2 (two) times daily as needed. (Patient not taking: Reported on 07/04/2023)     apixaban  (ELIQUIS ) 5 MG TABS tablet Take 1 tablet (5 mg total) by mouth 2 (two)  times daily. 60 tablet 5   ferrous sulfate 325 (65 FE) MG tablet Take 325 mg by mouth daily.     finasteride (PROSCAR) 5 MG tablet Take 5 mg by mouth.     furosemide  (LASIX ) 40 MG tablet Take 0.5 tablets (20 mg total) by mouth daily.     Melatonin 10 MG CAPS Take 10 mg nightly 30 capsule 5   metoprolol  succinate (TOPROL -XL) 50 MG 24 hr tablet TAKE 1 TABLET DAILY WITH OR IMMEDIATELY FOLLOWING A MEAL. PATIENT *NEED OFFICE VISIT [phone]. 90 tablet 0   ondansetron  (ZOFRAN ) 8 MG tablet Take 1 tablet (8 mg total) by mouth every 8 (eight) hours as needed for nausea or vomiting. 30 tablet 1   prochlorperazine  (COMPAZINE ) 10 MG tablet Take 1 tablet (10 mg total) by mouth every 6 (six) hours as needed for nausea or vomiting. 30 tablet 1   tamsulosin  (FLOMAX ) 0.4 MG CAPS capsule Take 1 capsule (0.4 mg total) by mouth daily. 30 capsule 0   valsartan  (DIOVAN ) 80 MG tablet Take 1 tablet (80 mg total) by mouth daily. 90 tablet 1   No current facility-administered medications for this visit.    HISTORY OF PRESENT ILLNESS:   Oncology History  Malignant melanoma (HCC)  01/01/2015 Initial Diagnosis   Malignant melanoma (HCC)   12/2014 Surgery   April 2016 WLE and SLN 01/31/2015: Reexcision    02/25/2018 Imaging   CT CAP Subcentimeter right upper lobe subpleural and left upper lobe perifissural nodules, new from 01/24/2016, indeterminate. Recommend short interval follow-up (3 months), given histor    04/07/2019 Imaging   CT Chest: No evidence of metastatic disease within the chest. Left upper lobe perifissural nodule no longer evident. Right upper lobe subpleural nodule no longer evident.   CT AP No evidence of metastatic disease.     04/2020 Pathology Results   Summer 2021: New scalp lesion, in transit  path: -Malignant melanoma, involving dermal tissue    04/12/2020 Imaging   CT chest: NED CT AP: NED    05/04/2020 Imaging   CT Neck: No mass or lymphadenopathy within the neck.    05/25/2020 -  07/25/2020 Chemotherapy   5 cycles of TVEC   01/24/2021 PET scan   PET whole body - No abnormal foci of increased radiotracer uptake to suggest metastatic disease.  -Small focus of increased uptake within the proximal sigmoid colon without adjacent stranding or definite wall thickening, indeterminant. Recommend further evaluation with colonoscopy if clinically    08/24/2021 PET scan   PET whole body - No abnormal foci of increased radiotracer uptake to suggest metastatic disease.   - Small focus of increased uptake within the proximal sigmoid colon, similar to 01/24/2021. Recommend clinical correlation and/or further evaluation with colonoscopy if clinically indicated    01/31/2022 Surgery   (Outside case, [phone], 1 slide, 01/23/2022) Skin, left inferior lateral anterior scalp, shave - Metastatic malignant melanoma Size: 2 mm Pattern: Pigmented epithelioid cells forming small plaque in superficial dermis and showing epidermotropism Tumor infiltrating lymphocytes: Brisk Nuclear grade: 3/3 Margins: Focally present adjacent to edge of tissue sections    02/21/2022 - 05/09/2022 Chemotherapy   had an intransit recurrence of melanoma and was retreated with TVEC.  Biopsy in October 2023 showed NED.    05/09/2022 PET scan   - Mildly  avid 3 mm right upper lobe subcentimeter pulmonary nodule, possibly increased from 2 mm on prior; recommend attention on follow-up.   -No definite evidence of metabolically active disease.    05/28/2022 Pathology Results   Diagnosis   Skin, Left temple, excision - Dermal fibrosis with lymphocytic and granulomatous inflammation consistent with site of recent therapy - No residual melanoma identified - Margins free of tumor      02/15/2023 PET scan   - Focal area of hypermetabolic activity is seen in the ascending colon, indeterminate. Recommend correlation with colonoscopy.   -Unchanged size of mildly hypermetabolic left paratracheal lymph node,  indeterminate. Recommend attention on follow-up imaging.   -Pancreatic calcifications likely reflect sequela of chronic pancreatitis.   -Bladder diverticuli and enlarged prostate.    05/13/2023 PET scan   1.  Evidence of progressive disease as demonstrated by interval enlargement of right upper lobe pulmonary nodule most concerning for metastasis by melanoma. Additional single right pulmonary nodule and 2 sites of focal osseous avidity which are suspicious but indeterminate for additional sites of involvement by metastasis involving the T8 and L1 vertebral bodies.  2.  Focal uptake within the sigmoid colon which has increased in conspicuity over time. Recommend correlation with colonoscopy if not recently completed with differential diagnosis including adenoma, carcinoma, and inflammatory lesion (given associated coarse calcifications/high density material). No evidence of complication (i.e.  fluid collection, or free air) by low-dose unenhanced CT.  3.  Persistent FDG abnormality of the left anterior shin associated with a dermal nodule. Recommend direct inspection as additional site of cutaneous neoplasm is not excluded.    06/14/2023 Pathology Results   A: Lung, right upper lobe, lobectomy  - Metastatic melanoma, multifocal (largest focus 2.4 cm, with two sub-centimeter nodules also identified microscopically, each 3 mm) - Surgical margins negative  - BRAF (VE1): Negative - TILs: brisk - Two lymph nodes, negative for malignancy (0/2)   07/17/2023 Genetic Testing   [facility]: pertinent negatives: BRAF. KIT TERT C.-124C>T variant- promoter mutation NRAS p.G13D Missense variant (exon 2) - GOF TP53 p.R342P ARID1A p.G285fs NF1 p.R1362* PHLPP2 p.Q300* NF1 p.K191_R192delinsN*  Multiple VUS findings.   08/05/2023 Imaging   MRI brain  --Single enhancing right occipital cortical lesion is most concerning for intracranial metastatic disease.   --Indeterminate 7 mm enhancing lesion in the right  posterior parietal bone. There is no definite FDG avidity in this region on the recent PET/CT, arguing against malignancy, however close attention to this area on follow-up imaging is recommended.    08/05/2023 Imaging   CT chest IMPRESSION:   -Right upper lobectomy with moderate sized loculated right pleural effusion. New 0.3 cm right lower lobe micronodule. Recommend attention on follow-up CT in 3 months.   Few subcentimeter right infra-axillary lymph nodes with adjacent fat stranding and benign morphology, new since prior. Recommendation follow-up CT.   Markedly enlarged left atrium and mild qualitative dilatation of right atrium. Extensive coronary artery calcifications.    09/06/2023 -  Chemotherapy   Patient is on Treatment Plan : METASTATIC MELANOMA Nivolumab  (240) q14d         REVIEW OF SYSTEMS:   All relevant systems were reviewed with the patient and are negative.   VITALS:  Blood pressure (!) 154/72, pulse 86, temperature (!) 97.3 F (36.3 C), temperature source Temporal, resp. rate 19, weight 178 lb 9.6 oz (81 kg), SpO2 99%.  Wt Readings from Last 3 Encounters:  09/06/23 178 lb 9.6 oz (81 kg)  08/12/23 175 lb (79.4 kg)  08/08/23 178 lb 9.6 oz (81 kg)    Body mass index is 25.26 kg/m.  Performance status (ECOG): 0 - Asymptomatic  PHYSICAL EXAM:   GENERAL:alert, no distress and comfortable SKIN: skin color normal, no rashes  EYES: normal, sclera clear LUNGS: clear to auscultation with normal breathing effort.  No wheeze or rales HEART: regular rate & rhythm and no murmurs and no lower extremity edema ABDOMEN: abdomen soft, non-tender and nondistended Musculoskeletal: no edema NEURO: alert, fluent speech  LABORATORY DATA:  I have reviewed the data as listed    Component Value Date/Time   NA 131 (L) 09/06/2023 1254   NA 126 (A) 06/28/2023 0000   K 3.9 09/06/2023 1254   CL 97 (L) 09/06/2023 1254   CO2 26 09/06/2023 1254   GLUCOSE 206 (H) 09/06/2023 1254    BUN 12 09/06/2023 1254   BUN 11 06/28/2023 0000   CREATININE 0.88 09/06/2023 1254   CREATININE 1.49 (H) 06/06/2020 1022   CALCIUM  9.8 09/06/2023 1254   PROT 7.5 09/06/2023 1254   PROT 6.9 02/20/2021 0920   ALBUMIN 4.6 09/06/2023 1254   ALBUMIN 4.6 02/20/2021 0920   AST 19 09/06/2023 1254   ALT 14 09/06/2023 1254   ALKPHOS 86 09/06/2023 1254   BILITOT 1.1 09/06/2023 1254   GFRNONAA >60 09/06/2023 1254   GFRAA 53 05/10/2020 0000    No results found for: SPEP, UPEP  Lab Results  Component Value Date   WBC 5.8 09/06/2023   NEUTROABS 4.4 09/06/2023   HGB 15.0 09/06/2023   HCT 43.1 09/06/2023   MCV 100.2 (H) 09/06/2023   PLT 196 09/06/2023      Chemistry      Component Value Date/Time   NA 131 (L) 09/06/2023 1254   NA 126 (A) 06/28/2023 0000   K 3.9 09/06/2023 1254   CL 97 (L) 09/06/2023 1254   CO2 26 09/06/2023 1254   BUN 12 09/06/2023 1254   BUN 11 06/28/2023 0000   CREATININE 0.88 09/06/2023 1254   CREATININE 1.49 (H) 06/06/2020 1022   GLU 103 06/28/2023 0000      Component Value Date/Time   CALCIUM  9.8 09/06/2023 1254   ALKPHOS 86 09/06/2023 1254   AST 19 09/06/2023 1254   ALT 14 09/06/2023 1254   BILITOT 1.1 09/06/2023 1254       RADIOGRAPHIC STUDIES: I have personally reviewed the radiological images as listed and agreed with the findings in the report. NM PET Image Initial (PI) Whole Body Result Date: 08/25/2023 CLINICAL DATA:  Subsequent treatment strategy for metastatic melanoma. EXAM: NUCLEAR MEDICINE PET WHOLE BODY TECHNIQUE: 8.7 mCi F-18 FDG was injected intravenously. Full-ring PET imaging was performed from the head to foot after the radiotracer. CT data was obtained and used for attenuation correction and anatomic localization. Fasting blood glucose: 133 mg/dl COMPARISON:  Correlation with outside hospital [facility]) PET-CT report dated 05/13/2023. Images not available. FINDINGS: Mediastinal blood pool activity: SUV max 2.3 HEAD/NECK: No hypermetabolic  activity in the scalp. No hypermetabolic cervical lymph nodes. Incidental CT findings: none CHEST: Status post right upper lobectomy. Small to moderate right pleural effusion. No suspicious pulmonary nodules. No hypermetabolic thoracic lymphadenopathy. Incidental CT findings: Cardiomegaly. Atherosclerotic calcifications of the aortic arch. Mild three-vessel coronary atherosclerosis. ABDOMEN/PELVIS: Dominant 16 mm lesion in the posterior right hepatic lobe (series 4/image 135), max SUV 10.9. This was not described on prior PET and is favored to be new. Additional small hepatic metastases are possible. No abnormal hypermetabolism in the pancreas, spleen, or  adrenal glands. No focal hypermetabolism the sigmoid colon to correspond to the prior PET abnormality (by report). No hypermetabolic abdominopelvic lymphadenopathy. Incidental CT findings: Status post cholecystectomy. Dystrophic parenchymal pancreatic calcifications, reflecting sequela of prior/chronic pancreatitis. Atherosclerotic calcifications the abdominal aorta and branch vessels. Enlargement of the central gland of the prostate, suggesting BPH. Dominant left posterolateral bladder diverticulum. Small fat containing right inguinal hernia. SKELETON: Multifocal osseous metastases, including: --Left T8 vertebral body, max SUV 19.3 --L1 vertebral body, max SUV 10.6 --Right L3 vertebral body, max SUV 9.4 --Right iliac bone, max SUV 3.7 --Left posterior acetabulum, max SUV 13.6 Although T8 and L1 were noted on the prior report, they have significantly progressed in of it of the (previously 2.6 and 2.8 respectively). The other lesions were not noted. As such, progression is suspected. Incidental CT findings: none EXTREMITIES: No abnormal hypermetabolic activity in the lower extremities. Incidental CT findings: none IMPRESSION: Status post right upper lobectomy. Small to moderate right pleural effusion. Suspected hepatic metastases, including a dominant 15 mm lesion  in the posterior right hepatic lobe, new. Multifocal osseous metastases, progressive. No focal metabolism in the sigmoid colon correspond to the prior PET abnormality (by report). Electronically Signed   By: Pinkie Pebbles M.D.   On: 08/25/2023 04:32   MR Brain W Wo Contrast Result Date: 08/11/2023 CLINICAL DATA:  Metastatic disease evaluation 7mm lesion seen on [facility] brain MRI- requesting 3T SRS Protocol for radiation treatment planning EXAM: MRI HEAD WITHOUT AND WITH CONTRAST TECHNIQUE: Multiplanar, multiecho pulse sequences of the brain and surrounding structures were obtained without and with intravenous contrast. CONTRAST:  8mL GADAVIST  GADOBUTROL  1 MMOL/ML IV SOLN COMPARISON:  None Available. FINDINGS: Brain: Negative for an acute infarct. No hemorrhage. No hydrocephalus. No extra-axial fluid collection. No mass effect. No mass lesion. There is a background of mild chronic microvascular ischemic change. There are multiple intracranial contrast-enhancing lesions (all lesions seen on series 1100), including: -3 mm contrast-enhancing cerebellar vermis (image 85). -6 mm contrast-enhancing lesion in the right occipital lobe (image 125) -3 mm contrast-enhancing lesion in the right temporal lobe (image 124) -2 mm contrast-enhancing lesion in the left parietal lobe (image 192) - Vascular: Normal flow voids. Skull and upper cervical spine: Normal marrow signal. Sinuses/Orbits: No middle ear or mastoid effusion. Polypoid mucosal thickening in the left maxillary sinus. Bilateral lens replacement. Orbits are otherwise unremarkable. Other: None. IMPRESSION: 1.  No acute intracranial abnormality. 2. Contrast-enhancing lesions in the cerebellar vermis, right occipital lobe, right temporal lobe, and left parietal lobe, as above, compatible with metastatic disease. Electronically Signed   By: Lyndall Gore M.D.   On: 08/11/2023 15:31

## 2023-09-06 ENCOUNTER — Inpatient Hospital Stay: Payer: Medicare HMO

## 2023-09-06 VITALS — BP 154/72 | HR 86 | Temp 97.3°F | Resp 19 | Wt 178.6 lb

## 2023-09-06 DIAGNOSIS — C7931 Secondary malignant neoplasm of brain: Secondary | ICD-10-CM

## 2023-09-06 DIAGNOSIS — C438 Malignant melanoma of overlapping sites of skin: Secondary | ICD-10-CM | POA: Diagnosis not present

## 2023-09-06 DIAGNOSIS — Z79899 Other long term (current) drug therapy: Secondary | ICD-10-CM | POA: Insufficient documentation

## 2023-09-06 DIAGNOSIS — Z5112 Encounter for antineoplastic immunotherapy: Secondary | ICD-10-CM | POA: Insufficient documentation

## 2023-09-06 DIAGNOSIS — Z9189 Other specified personal risk factors, not elsewhere classified: Secondary | ICD-10-CM | POA: Insufficient documentation

## 2023-09-06 DIAGNOSIS — C787 Secondary malignant neoplasm of liver and intrahepatic bile duct: Secondary | ICD-10-CM | POA: Diagnosis not present

## 2023-09-06 DIAGNOSIS — C434 Malignant melanoma of scalp and neck: Secondary | ICD-10-CM | POA: Diagnosis not present

## 2023-09-06 DIAGNOSIS — C7951 Secondary malignant neoplasm of bone: Secondary | ICD-10-CM | POA: Diagnosis not present

## 2023-09-06 DIAGNOSIS — C7800 Secondary malignant neoplasm of unspecified lung: Secondary | ICD-10-CM | POA: Diagnosis not present

## 2023-09-06 LAB — CMP (CANCER CENTER ONLY)
ALT: 14 U/L (ref 0–44)
AST: 19 U/L (ref 15–41)
Albumin: 4.6 g/dL (ref 3.5–5.0)
Alkaline Phosphatase: 86 U/L (ref 38–126)
Anion gap: 8 (ref 5–15)
BUN: 12 mg/dL (ref 8–23)
CO2: 26 mmol/L (ref 22–32)
Calcium: 9.8 mg/dL (ref 8.9–10.3)
Chloride: 97 mmol/L — ABNORMAL LOW (ref 98–111)
Creatinine: 0.88 mg/dL (ref 0.61–1.24)
GFR, Estimated: >60 mL/min (ref >60)
Glucose, Bld: 206 mg/dL — ABNORMAL HIGH (ref 70–99)
Potassium: 3.9 mmol/L (ref 3.5–5.1)
Sodium: 131 mmol/L — ABNORMAL LOW (ref 135–145)
Total Bilirubin: 1.1 mg/dL (ref 0.0–1.2)
Total Protein: 7.5 g/dL (ref 6.5–8.1)

## 2023-09-06 LAB — CBC WITH DIFFERENTIAL (CANCER CENTER ONLY)
Abs Immature Granulocytes: 0.01 10*3/uL (ref 0.00–0.07)
Basophils Absolute: 0 10*3/uL (ref 0.0–0.1)
Basophils Relative: 1 %
Eosinophils Absolute: 0.1 10*3/uL (ref 0.0–0.5)
Eosinophils Relative: 1 %
HCT: 43.1 % (ref 39.0–52.0)
Hemoglobin: 15 g/dL (ref 13.0–17.0)
Immature Granulocytes: 0 %
Lymphocytes Relative: 13 %
Lymphs Abs: 0.8 10*3/uL (ref 0.7–4.0)
MCH: 34.9 pg — ABNORMAL HIGH (ref 26.0–34.0)
MCHC: 34.8 g/dL (ref 30.0–36.0)
MCV: 100.2 fL — ABNORMAL HIGH (ref 80.0–100.0)
Monocytes Absolute: 0.5 10*3/uL (ref 0.1–1.0)
Monocytes Relative: 9 %
Neutro Abs: 4.4 10*3/uL (ref 1.7–7.7)
Neutrophils Relative %: 76 %
Platelet Count: 196 10*3/uL (ref 150–400)
RBC: 4.3 MIL/uL (ref 4.22–5.81)
RDW: 14 % (ref 11.5–15.5)
WBC Count: 5.8 10*3/uL (ref 4.0–10.5)
nRBC: 0 % (ref 0.0–0.2)

## 2023-09-06 LAB — LACTATE DEHYDROGENASE: LDH: 138 U/L (ref 98–192)

## 2023-09-06 LAB — TSH: TSH: 2.137 u[IU]/mL (ref 0.350–4.500)

## 2023-09-06 MED ORDER — NIVOLUMAB CHEMO INJECTION 100 MG/10ML
240.0000 mg | Freq: Once | INTRAVENOUS | Status: AC
  Administered 2023-09-06: 240 mg via INTRAVENOUS
  Filled 2023-09-06: qty 24

## 2023-09-06 MED ORDER — SODIUM CHLORIDE 0.9 % IV SOLN
INTRAVENOUS | Status: DC
Start: 2023-09-06 — End: 2023-09-06

## 2023-09-06 NOTE — Assessment & Plan Note (Signed)
 Monitor with CBC, CMP each visit for anemia related toxicity.

## 2023-09-06 NOTE — Assessment & Plan Note (Signed)
 Plan to start nivolumab today on 1/3

## 2023-09-06 NOTE — Assessment & Plan Note (Addendum)
 Completed SRS Start nivolumab today

## 2023-09-06 NOTE — Patient Instructions (Signed)
 [facility]  Discharge Instructions: Thank you for choosing [facility] to provide your oncology and hematology care.   If you have a lab appointment with the Cancer Center, please go directly to the Cancer Center and check in at the registration area.   Wear comfortable clothing and clothing appropriate for easy access to any Portacath or PICC line.   We strive to give you quality time with your provider. You may need to reschedule your appointment if you arrive late (15 or more minutes).  Arriving late affects you and other patients whose appointments are after yours.  Also, if you miss three or more appointments without notifying the office, you may be dismissed from the clinic at the provider's discretion.      For prescription refill requests, have your pharmacy contact our office and allow 72 hours for refills to be completed.    Today you received the following chemotherapy and/or immunotherapy agents opdivo      To help prevent nausea and vomiting after your treatment, we encourage you to take your nausea medication as directed.  BELOW ARE SYMPTOMS THAT SHOULD BE REPORTED IMMEDIATELY: *FEVER GREATER THAN 100.4 F (38 C) OR HIGHER *CHILLS OR SWEATING *NAUSEA AND VOMITING THAT IS NOT CONTROLLED WITH YOUR NAUSEA MEDICATION *UNUSUAL SHORTNESS OF BREATH *UNUSUAL BRUISING OR BLEEDING *URINARY PROBLEMS (pain or burning when urinating, or frequent urination) *BOWEL PROBLEMS (unusual diarrhea, constipation, pain near the anus) TENDERNESS IN MOUTH AND THROAT WITH OR WITHOUT PRESENCE OF ULCERS (sore throat, sores in mouth, or a toothache) UNUSUAL RASH, SWELLING OR PAIN  UNUSUAL VAGINAL DISCHARGE OR ITCHING   Items with * indicate a potential emergency and should be followed up as soon as possible or go to the Emergency Department if any problems should occur.  Please show the CHEMOTHERAPY ALERT CARD or IMMUNOTHERAPY  ALERT CARD at check-in to the Emergency Department and triage nurse.  Should you have questions after your visit or need to cancel or reschedule your appointment, please contact [facility] A [facility]  Dept: [phone]  and follow the prompts.  Office hours are 8:00 a.m. to 4:30 p.m. Monday - Friday. Please note that voicemails left after 4:00 p.m. may not be returned until the following business day.  We are closed weekends and major holidays. You have access to a nurse at all times for urgent questions. Please call the main number to the clinic Dept: [phone] and follow the prompts.   For any non-urgent questions, you may also contact your provider using MyChart. We now offer e-Visits for anyone 74 and older to request care online for non-urgent symptoms. For details visit [email or web address].   Also download the MyChart app! Go to the app store, search "MyChart", open the app, select [facility], and log in with your MyChart username and password.

## 2023-09-07 LAB — T4: T4, Total: 6.4 ug/dL (ref 4.5–12.0)

## 2023-09-09 ENCOUNTER — Telehealth: Payer: Self-pay | Admitting: *Deleted

## 2023-09-09 NOTE — Telephone Encounter (Signed)
 Left message for pt to return call to discuss how he did with his recent treatment.

## 2023-09-09 NOTE — Telephone Encounter (Signed)
-----   Message from Nurse Guilford Shi sent at 09/06/2023  4:07 PM EST ----- Regarding: FT Chemo Chang FT opdivo. Dr. Cherly Hensen. Able to complete appointment without incident

## 2023-09-11 DIAGNOSIS — C4339 Malignant melanoma of other parts of face: Secondary | ICD-10-CM | POA: Diagnosis not present

## 2023-09-11 DIAGNOSIS — C44729 Squamous cell carcinoma of skin of left lower limb, including hip: Secondary | ICD-10-CM | POA: Diagnosis not present

## 2023-09-19 NOTE — Assessment & Plan Note (Signed)
Continue nivolumab

## 2023-09-19 NOTE — Progress Notes (Signed)
Patient Care Team: Ardith Dark, MD as PCP - General (Family Medicine) Duke Salvia, MD as PCP - Electrophysiology (Cardiology) Duke Salvia, MD (Cardiology) Marcine Matar, MD as Attending Physician (Urology) Donzetta Starch, MD as Consulting Physician (Dermatology) Antony Contras, MD as Consulting Physician (Ophthalmology) Hilarie Fredrickson, MD as Consulting Physician (Gastroenterology) Dahlia Byes, [facility] (Inactive) as Pharmacist (Pharmacist) Alejandro Mulling, RN as [facility] Care Management  Clinic Day:  09/20/2023  Referring physician: Melven Sartorius, MD  ASSESSMENT & PLAN:   Assessment & Plan: Nile is a 83 y.o.male with history of atrial fibrillation on anticoagulation, hypertension, hyperlipidemia being seen at [facility] for cutaneous melanoma.    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  Suspicious for bone metastases. BRAF negative. Initial diagnosis: 2016 melanoma of scalp  Initially seen after right UL lobectomy with plan to start immunotherapy for stage 4 melanoma.  During restaging workup in December found to have solitary brain metastasis.  He has now completed SRS. PET showed interval development of diffuse metastasis Current Treatment: 09/06/23 Nivolumab  Continue monitor for any immune related toxicity.  Malignant melanoma Continue nivolumab   Return as scheduled.  The patient understands the plans discussed today and is in agreement with them.  He knows to contact our office if he develops concerns prior to his next appointment.  Melven Sartorius, MD  [facility] - [facility] DEPT [facility] [address] [place] [address] Dept: [phone] Dept Fax: [phone]   No orders of the defined types were placed in this encounter.     CHIEF COMPLAINT:  CC: Stage IV melanoma  Current Treatment:  Nivolumab  INTERVAL HISTORY:  Selena is here today for repeat clinical assessment. He denies fevers or chills.   Some back pain intermittently.  He is not sure if related to physical activity. No nausea, vomiting, rash, joint pain, coughing, shortness of breath, chest pain, headaches, abdominal pain, diarrhea, difficulty urinating.  Reported history of eczema unchanged.  I have reviewed the past medical history, past surgical history, social history and family history with the patient and they are unchanged from previous note.  ALLERGIES:  is allergic to pertussis vaccines.  MEDICATIONS:  Current Outpatient Medications  Medication Sig Dispense Refill   allopurinol (ZYLOPRIM) 300 MG tablet TAKE 1 TABLET BY MOUTH EVERY DAY 90 tablet 0   amLODipine (NORVASC) 5 MG tablet Take 0.5 tablets (2.5 mg total) by mouth daily.     ampicillin (PRINCIPEN) 500 MG capsule Take 500 mg by mouth 2 (two) times daily as needed. (Patient not taking: Reported on 07/04/2023)     apixaban (ELIQUIS) 5 MG TABS tablet Take 1 tablet (5 mg total) by mouth 2 (two) times daily. 60 tablet 5   ferrous sulfate 325 (65 FE) MG tablet Take 325 mg by mouth daily.     finasteride (PROSCAR) 5 MG tablet Take 5 mg by mouth.     furosemide (LASIX) 40 MG tablet Take 0.5 tablets (20 mg total) by mouth daily.     Melatonin 10 MG CAPS Take 10 mg nightly 30 capsule 5   metoprolol succinate (TOPROL-XL) 50 MG 24 hr tablet TAKE 1 TABLET DAILY WITH OR IMMEDIATELY FOLLOWING A MEAL. PATIENT *NEED OFFICE VISIT [phone]. 90 tablet 0   ondansetron (ZOFRAN) 8 MG tablet Take 1 tablet (8 mg total) by mouth every 8 (eight)  hours as needed for nausea or vomiting. 30 tablet 1   prochlorperazine (COMPAZINE) 10 MG tablet Take 1 tablet (10 mg total) by mouth every 6 (six) hours as needed for nausea or vomiting. 30 tablet 1   tamsulosin (FLOMAX) 0.4 MG CAPS capsule Take 1 capsule (0.4 mg total) by mouth daily. 30 capsule 0   valsartan (DIOVAN) 80 MG  tablet Take 1 tablet (80 mg total) by mouth daily. 90 tablet 1   No current facility-administered medications for this visit.   Facility-Administered Medications Ordered in Other Visits  Medication Dose Route Frequency Provider Last Rate Last Admin   0.9 %  sodium chloride infusion   Intravenous Continuous Melven Sartorius, MD   Stopped at 09/20/23 1453    HISTORY OF PRESENT ILLNESS:   Oncology History  Malignant melanoma (HCC)  01/01/2015 Initial Diagnosis   Malignant melanoma (HCC)   12/2014 Surgery   April 2016 WLE and SLN 01/31/2015: Reexcision    02/25/2018 Imaging   CT CAP Subcentimeter right upper lobe subpleural and left upper lobe perifissural nodules, new from 01/24/2016, indeterminate. Recommend short interval follow-up (3 months), given histor    04/07/2019 Imaging   CT Chest: No evidence of metastatic disease within the chest. Left upper lobe perifissural nodule no longer evident. Right upper lobe subpleural nodule no longer evident.   CT AP No evidence of metastatic disease.     04/2020 Pathology Results   Summer 2021: New scalp lesion, in transit  path: -Malignant melanoma, involving dermal tissue    04/12/2020 Imaging   CT chest: NED CT AP: NED    05/04/2020 Imaging   CT Neck: No mass or lymphadenopathy within the neck.    05/25/2020 - 07/25/2020 Chemotherapy   5 cycles of TVEC   01/24/2021 PET scan   PET whole body - No abnormal foci of increased radiotracer uptake to suggest metastatic disease.  -Small focus of increased uptake within the proximal sigmoid colon without adjacent stranding or definite wall thickening, indeterminant. Recommend further evaluation with colonoscopy if clinically    08/24/2021 PET scan   PET whole body - No abnormal foci of increased radiotracer uptake to suggest metastatic disease.   - Small focus of increased uptake within the proximal sigmoid colon, similar to 01/24/2021. Recommend clinical correlation and/or further evaluation  with colonoscopy if clinically indicated    01/31/2022 Surgery   (Outside case, [phone], 1 slide, 01/23/2022) Skin, left inferior lateral anterior scalp, shave - Metastatic malignant melanoma Size: 2 mm Pattern: Pigmented epithelioid cells forming small plaque in superficial dermis and showing epidermotropism Tumor infiltrating lymphocytes: Brisk Nuclear grade: 3/3 Margins: Focally present adjacent to edge of tissue sections    02/21/2022 - 05/09/2022 Chemotherapy   had an intransit recurrence of melanoma and was retreated with TVEC.  Biopsy in October 2023 showed NED.    05/09/2022 PET scan   - Mildly avid 3 mm right upper lobe subcentimeter pulmonary nodule, possibly increased from 2 mm on prior; recommend attention on follow-up.   -No definite evidence of metabolically active disease.    05/28/2022 Pathology Results   Diagnosis   Skin, Left temple, excision - Dermal fibrosis with lymphocytic and granulomatous inflammation consistent with site of recent therapy - No residual melanoma identified - Margins free of tumor      02/15/2023 PET scan   - Focal area of hypermetabolic activity is seen in the ascending colon, indeterminate. Recommend correlation with colonoscopy.   -Unchanged size of mildly hypermetabolic left paratracheal lymph  node, indeterminate. Recommend attention on follow-up imaging.   -Pancreatic calcifications likely reflect sequela of chronic pancreatitis.   -Bladder diverticuli and enlarged prostate.    05/13/2023 PET scan   1.  Evidence of progressive disease as demonstrated by interval enlargement of right upper lobe pulmonary nodule most concerning for metastasis by melanoma. Additional single right pulmonary nodule and 2 sites of focal osseous avidity which are suspicious but indeterminate for additional sites of involvement by metastasis involving the T8 and L1 vertebral bodies.  2.  Focal uptake within the sigmoid colon which has increased in conspicuity over  time. Recommend correlation with colonoscopy if not recently completed with differential diagnosis including adenoma, carcinoma, and inflammatory lesion (given associated coarse calcifications/high density material). No evidence of complication (i.e.  fluid collection, or free air) by low-dose unenhanced CT.  3.  Persistent FDG abnormality of the left anterior shin associated with a dermal nodule. Recommend direct inspection as additional site of cutaneous neoplasm is not excluded.    06/14/2023 Pathology Results   A: Lung, right upper lobe, lobectomy  - Metastatic melanoma, multifocal (largest focus 2.4 cm, with two sub-centimeter nodules also identified microscopically, each 3 mm) - Surgical margins negative  - BRAF (VE1): Negative - TILs: brisk - Two lymph nodes, negative for malignancy (0/2)   07/17/2023 Genetic Testing   [facility]: pertinent negatives: BRAF. KIT TERT C.-124C>T variant- promoter mutation NRAS p.G13D Missense variant (exon 2) - GOF TP53 p.R342P ARID1A p.G264fs NF1 p.R1362* PHLPP2 p.Q300* NF1 p.K191_R192delinsN*  Multiple VUS findings.   08/05/2023 Imaging   MRI brain  --Single enhancing right occipital cortical lesion is most concerning for intracranial metastatic disease.   --Indeterminate 7 mm enhancing lesion in the right posterior parietal bone. There is no definite FDG avidity in this region on the recent PET/CT, arguing against malignancy, however close attention to this area on follow-up imaging is recommended.    08/05/2023 Imaging   CT chest IMPRESSION:   -Right upper lobectomy with moderate sized loculated right pleural effusion. New 0.3 cm right lower lobe micronodule. Recommend attention on follow-up CT in 3 months.   Few subcentimeter right infra-axillary lymph nodes with adjacent fat stranding and benign morphology, new since prior. Recommendation follow-up CT.   Markedly enlarged left atrium and mild qualitative dilatation of right atrium. Extensive  coronary artery calcifications.    09/06/2023 -  Chemotherapy   Patient is on Treatment Plan : METASTATIC MELANOMA Nivolumab (240) q14d     09/19/2023 Cancer Staging   Staging form: Melanoma of the Skin, AJCC 7th Edition - Clinical: Stage IV (TX, N0, M1c) - Signed by Melven Sartorius, MD on 09/19/2023 Biopsy of metastatic site performed: Yes Source of metastatic specimen: Lung       REVIEW OF SYSTEMS:   All relevant systems were reviewed with the patient and are negative.   VITALS:  Blood pressure 133/75, pulse 72, temperature 97.8 F (36.6 C), temperature source Oral, resp. rate 18, weight 176 lb 4.8 oz (80 kg), SpO2 100%.  Wt Readings from Last 3 Encounters:  09/20/23 176 lb 4.8 oz (80 kg)  09/06/23 178 lb 9.6 oz (81 kg)  08/12/23 175 lb (79.4 kg)    Body mass index is 24.94 kg/m.  Performance status (ECOG): 0 - Asymptomatic  PHYSICAL EXAM:   GENERAL:alert, no distress and comfortable SKIN: skin color normal, facial and extremity eczema EYES: normal, sclera clear OROPHARYNX: no exudate, no erythema    NECK: supple,  non-tender, without nodularity LYMPH:  no palpable  cervical lymphadenopathy LUNGS: clear to auscultation with normal breathing effort.  No wheeze or rales HEART: regular rate & rhythm and no murmurs and no lower extremity edema ABDOMEN: abdomen soft, non-tender and nondistended Musculoskeletal: no edema NEURO: alert, fluent speech, no focal motor/sensory deficits.  Strength and sensation equal bilaterally.  LABORATORY DATA:  I have reviewed the data as listed    Component Value Date/Time   NA 131 (L) 09/20/2023 1146   NA 126 (A) 06/28/2023 0000   K 4.0 09/20/2023 1146   CL 96 (L) 09/20/2023 1146   CO2 29 09/20/2023 1146   GLUCOSE 157 (H) 09/20/2023 1146   BUN 18 09/20/2023 1146   BUN 11 06/28/2023 0000   CREATININE 0.97 09/20/2023 1146   CREATININE 1.49 (H) 06/06/2020 1022   CALCIUM 9.8 09/20/2023 1146   PROT 7.1 09/20/2023 1146   PROT 6.9  02/20/2021 0920   ALBUMIN 4.3 09/20/2023 1146   ALBUMIN 4.6 02/20/2021 0920   AST 28 09/20/2023 1146   ALT 22 09/20/2023 1146   ALKPHOS 92 09/20/2023 1146   BILITOT 0.7 09/20/2023 1146   GFRNONAA >60 09/20/2023 1146   GFRAA 53 05/10/2020 0000    No results found for: "SPEP", "UPEP"  Lab Results  Component Value Date   WBC 5.7 09/20/2023   NEUTROABS 4.1 09/20/2023   HGB 14.2 09/20/2023   HCT 41.8 09/20/2023   MCV 100.0 09/20/2023   PLT 224 09/20/2023      Chemistry      Component Value Date/Time   NA 131 (L) 09/20/2023 1146   NA 126 (A) 06/28/2023 0000   K 4.0 09/20/2023 1146   CL 96 (L) 09/20/2023 1146   CO2 29 09/20/2023 1146   BUN 18 09/20/2023 1146   BUN 11 06/28/2023 0000   CREATININE 0.97 09/20/2023 1146   CREATININE 1.49 (H) 06/06/2020 1022   GLU 103 06/28/2023 0000      Component Value Date/Time   CALCIUM 9.8 09/20/2023 1146   ALKPHOS 92 09/20/2023 1146   AST 28 09/20/2023 1146   ALT 22 09/20/2023 1146   BILITOT 0.7 09/20/2023 1146       RADIOGRAPHIC STUDIES: I have personally reviewed the radiological images as listed and agreed with the findings in the report. No results found.

## 2023-09-20 ENCOUNTER — Inpatient Hospital Stay: Payer: Medicare HMO

## 2023-09-20 ENCOUNTER — Other Ambulatory Visit: Payer: Self-pay

## 2023-09-20 ENCOUNTER — Inpatient Hospital Stay: Payer: Medicare HMO | Admitting: Dietician

## 2023-09-20 VITALS — BP 133/75 | HR 72 | Temp 97.8°F | Resp 18 | Wt 176.3 lb

## 2023-09-20 DIAGNOSIS — C438 Malignant melanoma of overlapping sites of skin: Secondary | ICD-10-CM

## 2023-09-20 DIAGNOSIS — C434 Malignant melanoma of scalp and neck: Secondary | ICD-10-CM | POA: Diagnosis not present

## 2023-09-20 DIAGNOSIS — C7931 Secondary malignant neoplasm of brain: Secondary | ICD-10-CM | POA: Diagnosis not present

## 2023-09-20 DIAGNOSIS — Z79899 Other long term (current) drug therapy: Secondary | ICD-10-CM | POA: Diagnosis not present

## 2023-09-20 DIAGNOSIS — C7951 Secondary malignant neoplasm of bone: Secondary | ICD-10-CM | POA: Diagnosis not present

## 2023-09-20 DIAGNOSIS — C787 Secondary malignant neoplasm of liver and intrahepatic bile duct: Secondary | ICD-10-CM | POA: Diagnosis not present

## 2023-09-20 DIAGNOSIS — D539 Nutritional anemia, unspecified: Secondary | ICD-10-CM

## 2023-09-20 DIAGNOSIS — Z5112 Encounter for antineoplastic immunotherapy: Secondary | ICD-10-CM | POA: Diagnosis not present

## 2023-09-20 DIAGNOSIS — C7800 Secondary malignant neoplasm of unspecified lung: Secondary | ICD-10-CM | POA: Diagnosis not present

## 2023-09-20 LAB — TSH: TSH: 2.268 u[IU]/mL (ref 0.350–4.500)

## 2023-09-20 LAB — CMP (CANCER CENTER ONLY)
ALT: 22 U/L (ref 0–44)
AST: 28 U/L (ref 15–41)
Albumin: 4.3 g/dL (ref 3.5–5.0)
Alkaline Phosphatase: 92 U/L (ref 38–126)
Anion gap: 6 (ref 5–15)
BUN: 18 mg/dL (ref 8–23)
CO2: 29 mmol/L (ref 22–32)
Calcium: 9.8 mg/dL (ref 8.9–10.3)
Chloride: 96 mmol/L — ABNORMAL LOW (ref 98–111)
Creatinine: 0.97 mg/dL (ref 0.61–1.24)
GFR, Estimated: >60 mL/min (ref >60)
Glucose, Bld: 157 mg/dL — ABNORMAL HIGH (ref 70–99)
Potassium: 4 mmol/L (ref 3.5–5.1)
Sodium: 131 mmol/L — ABNORMAL LOW (ref 135–145)
Total Bilirubin: 0.7 mg/dL (ref 0.0–1.2)
Total Protein: 7.1 g/dL (ref 6.5–8.1)

## 2023-09-20 LAB — CBC WITH DIFFERENTIAL (CANCER CENTER ONLY)
Abs Immature Granulocytes: 0.02 10*3/uL (ref 0.00–0.07)
Basophils Absolute: 0 10*3/uL (ref 0.0–0.1)
Basophils Relative: 1 %
Eosinophils Absolute: 0.1 10*3/uL (ref 0.0–0.5)
Eosinophils Relative: 2 %
HCT: 41.8 % (ref 39.0–52.0)
Hemoglobin: 14.2 g/dL (ref 13.0–17.0)
Immature Granulocytes: 0 %
Lymphocytes Relative: 13 %
Lymphs Abs: 0.8 10*3/uL (ref 0.7–4.0)
MCH: 34 pg (ref 26.0–34.0)
MCHC: 34 g/dL (ref 30.0–36.0)
MCV: 100 fL (ref 80.0–100.0)
Monocytes Absolute: 0.6 10*3/uL (ref 0.1–1.0)
Monocytes Relative: 11 %
Neutro Abs: 4.1 10*3/uL (ref 1.7–7.7)
Neutrophils Relative %: 73 %
Platelet Count: 224 10*3/uL (ref 150–400)
RBC: 4.18 MIL/uL — ABNORMAL LOW (ref 4.22–5.81)
RDW: 13.7 % (ref 11.5–15.5)
WBC Count: 5.7 10*3/uL (ref 4.0–10.5)
nRBC: 0 % (ref 0.0–0.2)

## 2023-09-20 LAB — T4, FREE: Free T4: 1.12 ng/dL (ref 0.61–1.12)

## 2023-09-20 LAB — LACTATE DEHYDROGENASE: LDH: 146 U/L (ref 98–192)

## 2023-09-20 MED ORDER — SODIUM CHLORIDE 0.9 % IV SOLN
INTRAVENOUS | Status: DC
Start: 2023-09-20 — End: 2023-09-20

## 2023-09-20 MED ORDER — SODIUM CHLORIDE 0.9 % IV SOLN
240.0000 mg | Freq: Once | INTRAVENOUS | Status: AC
  Administered 2023-09-20: 240 mg via INTRAVENOUS
  Filled 2023-09-20: qty 24

## 2023-09-20 NOTE — Progress Notes (Signed)
Nutrition Assessment   Reason for Assessment: +MST (wt loss)   ASSESSMENT: 83 year old male with metastatic melanoma to brain, lung, bone, and liver. He is receiving nivolumab q14d (first 09/06/23)  Past medical history includes HTN, atrial fibrillation, GERD, hepatic cirrhosis, polyneuropathy, BPH, HLD, gout, macrocytic anemia  Met with patient in infusion. He reports tolerating first immunotherapy well without side effects. Patient endorses decreased appetite. He is eating 50-70% of most meals. Patient and his wife reside at [employer]. He states the food is great and really enjoys the soups. Recalls glass of juice, one cup coffee, half banana, 2 muffins or toast, sausage or bacon. Lunch is usually half sandwich and soup. Patient had side salad with bleu cheese, risotto, duck, broccoli, and ice cream last night. Says he ate 50% of  dinner. He is drinking Ensure HP occasionally. Patient denies nausea, vomiting, diarrhea, constipation.   Nutrition Focused Physical Exam: deferred    Medications: zyloprim, norvasc, eliquis, ferrous sulfate, lasix, toprol, zofran, compazine, flomax, diovan   Labs: Na 131, glucose 157   Anthropometrics:   Height: 5'10.5" Weight: 176 lb 4.8 oz  UBW: 190 lb (10/2022) BMI: 24.94   NUTRITION DIAGNOSIS: Food and nutrition related knowledge deficit related to cancer as evidenced by no prior need for associated nutrition information   INTERVENTION:  Educated on importance of adequate calorie/protein energy intake to maintain strength/weights, prevent loss of lean body mass Encouraged high calorie high protein snacks in between meals - ideas provided Suggested daily Ensure Complete/equivalent - samples + coupons  Contact information provided     MONITORING, EVALUATION, GOAL: Patient will tolerate increased calories and protein to minimize further wt loss    Next Visit: Thursday February 13 during infusion

## 2023-09-20 NOTE — Patient Instructions (Signed)
 [facility]  Discharge Instructions: Thank you for choosing [facility] to provide your oncology and hematology care.   If you have a lab appointment with the Cancer Center, please go directly to the Cancer Center and check in at the registration area.   Wear comfortable clothing and clothing appropriate for easy access to any Portacath or PICC line.   We strive to give you quality time with your provider. You may need to reschedule your appointment if you arrive late (15 or more minutes).  Arriving late affects you and other patients whose appointments are after yours.  Also, if you miss three or more appointments without notifying the office, you may be dismissed from the clinic at the provider's discretion.      For prescription refill requests, have your pharmacy contact our office and allow 72 hours for refills to be completed.    Today you received the following chemotherapy and/or immunotherapy agents: Opdivo.       To help prevent nausea and vomiting after your treatment, we encourage you to take your nausea medication as directed.  BELOW ARE SYMPTOMS THAT SHOULD BE REPORTED IMMEDIATELY: *FEVER GREATER THAN 100.4 F (38 C) OR HIGHER *CHILLS OR SWEATING *NAUSEA AND VOMITING THAT IS NOT CONTROLLED WITH YOUR NAUSEA MEDICATION *UNUSUAL SHORTNESS OF BREATH *UNUSUAL BRUISING OR BLEEDING *URINARY PROBLEMS (pain or burning when urinating, or frequent urination) *BOWEL PROBLEMS (unusual diarrhea, constipation, pain near the anus) TENDERNESS IN MOUTH AND THROAT WITH OR WITHOUT PRESENCE OF ULCERS (sore throat, sores in mouth, or a toothache) UNUSUAL RASH, SWELLING OR PAIN  UNUSUAL VAGINAL DISCHARGE OR ITCHING   Items with * indicate a potential emergency and should be followed up as soon as possible or go to the Emergency Department if any problems should occur.  Please show the CHEMOTHERAPY ALERT CARD or IMMUNOTHERAPY  ALERT CARD at check-in to the Emergency Department and triage nurse.  Should you have questions after your visit or need to cancel or reschedule your appointment, please contact [facility]  Dept: [phone]  and follow the prompts.  Office hours are 8:00 a.m. to 4:30 p.m. Monday - Friday. Please note that voicemails left after 4:00 p.m. may not be returned until the following business day.  We are closed weekends and major holidays. You have access to a nurse at all times for urgent questions. Please call the main number to the clinic Dept: [phone] and follow the prompts.   For any non-urgent questions, you may also contact your provider using MyChart. We now offer e-Visits for anyone 45 and older to request care online for non-urgent symptoms. For details visit [email or web address].   Also download the MyChart app! Go to the app store, search "MyChart", open the app, select [place], and log in with your MyChart username and password.

## 2023-09-24 ENCOUNTER — Ambulatory Visit
Admission: RE | Admit: 2023-09-24 | Discharge: 2023-09-24 | Disposition: A | Discharge status: Home / Self Care | Payer: Medicare HMO | Source: Ambulatory Visit | Attending: Internal Medicine | Admitting: Internal Medicine

## 2023-09-24 NOTE — Progress Notes (Signed)
  Radiation Oncology         [phone]) [phone] ________________________________  Name: Nicholas Bird MRN: [id number]  Date of Service: 09/24/2023  DOB: 02/06/1941  Post Treatment Telephone Note  Diagnosis:  C79.31 Secondary malignant neoplasm of brain (as documented in provider EOT note)  The patient was available for call today.  The patient did not note fatigue during radiation. The patient did not note hair loss or skin changes in the field of radiation during therapy. The patient is not taking dexamethasone. The patient does not have symptoms of  weakness or loss of control of the extremities. The patient does not have symptoms of headache. The patient does not have symptoms of seizure or uncontrolled movement. The patient does not have symptoms of changes in vision. The patient does not have changes in speech. The patient does not have confusion.   The patient was counseled that he  will be contacted by our brain and spine navigator to schedule surveillance imaging. The patient was encouraged to call if he  have not received a call to schedule imaging, or if he  develops concerns or questions regarding radiation. The patient will also continue to follow up with Dr. Cherly Hensen in medical oncology.   This concludes the interaction.  Ruel Favors, LPN

## 2023-09-25 ENCOUNTER — Other Ambulatory Visit: Payer: Self-pay | Admitting: Radiation Therapy

## 2023-09-25 DIAGNOSIS — C7931 Secondary malignant neoplasm of brain: Secondary | ICD-10-CM

## 2023-09-26 DIAGNOSIS — D0339 Melanoma in situ of other parts of face: Secondary | ICD-10-CM | POA: Diagnosis not present

## 2023-10-02 ENCOUNTER — Other Ambulatory Visit: Payer: Self-pay | Admitting: Internal Medicine

## 2023-10-04 ENCOUNTER — Inpatient Hospital Stay: Payer: Medicare HMO

## 2023-10-04 VITALS — BP 134/65 | HR 73 | Temp 97.7°F | Resp 16 | Ht 70.5 in | Wt 176.9 lb

## 2023-10-04 DIAGNOSIS — C7951 Secondary malignant neoplasm of bone: Secondary | ICD-10-CM | POA: Diagnosis not present

## 2023-10-04 DIAGNOSIS — C787 Secondary malignant neoplasm of liver and intrahepatic bile duct: Secondary | ICD-10-CM | POA: Diagnosis not present

## 2023-10-04 DIAGNOSIS — C7931 Secondary malignant neoplasm of brain: Secondary | ICD-10-CM | POA: Diagnosis not present

## 2023-10-04 DIAGNOSIS — C438 Malignant melanoma of overlapping sites of skin: Secondary | ICD-10-CM

## 2023-10-04 DIAGNOSIS — E538 Deficiency of other specified B group vitamins: Secondary | ICD-10-CM | POA: Insufficient documentation

## 2023-10-04 DIAGNOSIS — Z79899 Other long term (current) drug therapy: Secondary | ICD-10-CM | POA: Diagnosis not present

## 2023-10-04 DIAGNOSIS — D539 Nutritional anemia, unspecified: Secondary | ICD-10-CM

## 2023-10-04 DIAGNOSIS — Z9189 Other specified personal risk factors, not elsewhere classified: Secondary | ICD-10-CM | POA: Diagnosis not present

## 2023-10-04 DIAGNOSIS — Z5112 Encounter for antineoplastic immunotherapy: Secondary | ICD-10-CM | POA: Diagnosis not present

## 2023-10-04 DIAGNOSIS — C7800 Secondary malignant neoplasm of unspecified lung: Secondary | ICD-10-CM | POA: Diagnosis not present

## 2023-10-04 DIAGNOSIS — C434 Malignant melanoma of scalp and neck: Secondary | ICD-10-CM | POA: Diagnosis not present

## 2023-10-04 LAB — CBC WITH DIFFERENTIAL (CANCER CENTER ONLY)
Abs Immature Granulocytes: 0.01 10*3/uL (ref 0.00–0.07)
Basophils Absolute: 0 10*3/uL (ref 0.0–0.1)
Basophils Relative: 1 %
Eosinophils Absolute: 0.1 10*3/uL (ref 0.0–0.5)
Eosinophils Relative: 2 %
HCT: 40.5 % (ref 39.0–52.0)
Hemoglobin: 14 g/dL (ref 13.0–17.0)
Immature Granulocytes: 0 %
Lymphocytes Relative: 15 %
Lymphs Abs: 0.8 10*3/uL (ref 0.7–4.0)
MCH: 33.8 pg (ref 26.0–34.0)
MCHC: 34.6 g/dL (ref 30.0–36.0)
MCV: 97.8 fL (ref 80.0–100.0)
Monocytes Absolute: 0.6 10*3/uL (ref 0.1–1.0)
Monocytes Relative: 11 %
Neutro Abs: 4 10*3/uL (ref 1.7–7.7)
Neutrophils Relative %: 71 %
Platelet Count: 228 10*3/uL (ref 150–400)
RBC: 4.14 MIL/uL — ABNORMAL LOW (ref 4.22–5.81)
RDW: 13.5 % (ref 11.5–15.5)
WBC Count: 5.5 10*3/uL (ref 4.0–10.5)
nRBC: 0 % (ref 0.0–0.2)

## 2023-10-04 LAB — CMP (CANCER CENTER ONLY)
ALT: 39 U/L (ref 0–44)
AST: 34 U/L (ref 15–41)
Albumin: 4.4 g/dL (ref 3.5–5.0)
Alkaline Phosphatase: 111 U/L (ref 38–126)
Anion gap: 8 (ref 5–15)
BUN: 14 mg/dL (ref 8–23)
CO2: 26 mmol/L (ref 22–32)
Calcium: 9.7 mg/dL (ref 8.9–10.3)
Chloride: 98 mmol/L (ref 98–111)
Creatinine: 0.82 mg/dL (ref 0.61–1.24)
GFR, Estimated: >60 mL/min (ref >60)
Glucose, Bld: 163 mg/dL — ABNORMAL HIGH (ref 70–99)
Potassium: 3.8 mmol/L (ref 3.5–5.1)
Sodium: 132 mmol/L — ABNORMAL LOW (ref 135–145)
Total Bilirubin: 1 mg/dL (ref 0.0–1.2)
Total Protein: 7.3 g/dL (ref 6.5–8.1)

## 2023-10-04 LAB — TSH: TSH: 2.08 u[IU]/mL (ref 0.350–4.500)

## 2023-10-04 LAB — LACTATE DEHYDROGENASE: LDH: 142 U/L (ref 98–192)

## 2023-10-04 MED ORDER — NIVOLUMAB CHEMO INJECTION 100 MG/10ML
240.0000 mg | Freq: Once | INTRAVENOUS | Status: AC
  Administered 2023-10-04: 240 mg via INTRAVENOUS
  Filled 2023-10-04: qty 24

## 2023-10-04 MED ORDER — SODIUM CHLORIDE 0.9 % IV SOLN
INTRAVENOUS | Status: DC
Start: 2023-10-04 — End: 2023-10-04

## 2023-10-04 NOTE — Assessment & Plan Note (Signed)
Completed SRS Continue nivolumab today Follow up MRI had been ordered and scheduled

## 2023-10-04 NOTE — Assessment & Plan Note (Addendum)
No anemia. Will repeat B12 with MMA next time.

## 2023-10-04 NOTE — Assessment & Plan Note (Addendum)
Monitor with CBC, CMP each visit and H&P for immune related toxicity.

## 2023-10-04 NOTE — Progress Notes (Signed)
Patient Care Team: Ardith Dark, MD as PCP - General (Family Medicine) Duke Salvia, MD as PCP - Electrophysiology (Cardiology) Duke Salvia, MD (Cardiology) Marcine Matar, MD as Attending Physician (Urology) Donzetta Starch, MD as Consulting Physician (Dermatology) Antony Contras, MD as Consulting Physician (Ophthalmology) Hilarie Fredrickson, MD as Consulting Physician (Gastroenterology) Dahlia Byes, [facility] (Inactive) as Pharmacist (Pharmacist) Alejandro Mulling, RN as [facility] Care Management  Clinic Day:  10/04/2023  Referring physician: Melven Sartorius, MD  ASSESSMENT & PLAN:   Assessment & Plan: Nicholas Bird is a 83 y.o.male with history of atrial fibrillation on anticoagulation, hypertension, hyperlipidemia being seen [facility] for cutaneous melanoma. Currently on active treatment with nivolumab.  Initially seen after right UL lobectomy with plan to start immunotherapy for stage 4 melanoma.  During restaging workup in December found to have solitary brain metastasis.  He has now completed SRS. Repeat imaging found bone and liver metastases, asymptomatic.    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 Initial diagnosis: 2016 melanoma of scalp Current treatment: Nivolumab every 2 weeks.  Treatment requires intense monitoring for immune related toxicities with history, physical and labs.  Malignant melanoma metastatic to brain [facility]) Completed SRS Continue nivolumab today Follow up MRI had been ordered and scheduled  At risk for side effect of medication Monitor with CBC, CMP each visit and H&P for immune related toxicity.   Low serum vitamin B12 No anemia. Will repeat B12 with MMA next time.   The patient understands the plans discussed today and is in agreement with them.  He knows to contact our office if he develops concerns prior to his next appointment.  Melven Sartorius, MD  [facility] - A DEPT OF [facility] [address] [place] [address] Dept: [phone] Dept Fax: [phone]   Orders Placed This Encounter  Procedures   NM PET Image Initial (PI) Whole Body    Standing Status:   Future    Expected Date:   11/22/2023    Expiration Date:   10/03/2024    If indicated for the ordered procedure, I authorize the administration of a radiopharmaceutical per Radiology protocol:   Yes    Preferred imaging location?:   [facility]   Lactate dehydrogenase    Standing Status:   Future    Expected Date:   11/29/2023    Expiration Date:   11/28/2024   CBC with Differential (Cancer Center Only)    Standing Status:   Future    Expected Date:   11/29/2023    Expiration Date:   11/28/2024   CMP (Cancer Center only)    Standing Status:   Future    Expected Date:   11/29/2023    Expiration Date:   11/28/2024   T4    Standing Status:   Future    Expected Date:   11/29/2023    Expiration Date:   11/28/2024   TSH    Standing Status:   Future    Expected Date:   11/29/2023    Expiration Date:   11/28/2024   Vitamin B12    Standing Status:   Future    Expected Date:   10/18/2023    Expiration Date:   10/17/2024   Methylmalonic acid, serum    Standing Status:   Future    Expected Date:   10/18/2023  Expiration Date:   10/17/2024      CHIEF COMPLAINT:  CC: melanoma  Current Treatment:  nivolumab  INTERVAL HISTORY:  [name] is here today for repeat clinical assessment.  Chills on first infusion resolved. He denies fevers or chills. He reports unchanged lower back pain. His appetite is better.   No headaches, nausea, vomiting, chest pain, coughing, or short of breath, diarrhea, stomach pain,   He uses stool softeners and Miralax for constipation and has helped.   Self cath with 8-12 oz every 6-8 hours. He drinks about 4 glasses of water and milk and juice.  Slight leg ankle edema improved after  cardiologist reduced dose of amlodipine and improved. Last week removed left side temporal melanoma.   No new rash.   I have reviewed the past medical history, past surgical history, social history and family history with the patient and they are unchanged from previous note.  ALLERGIES:  is allergic to pertussis vaccines.  MEDICATIONS:  Current Outpatient Medications  Medication Sig Dispense Refill   allopurinol (ZYLOPRIM) 300 MG tablet TAKE 1 TABLET BY MOUTH EVERY DAY 90 tablet 0   amLODipine (NORVASC) 5 MG tablet TAKE 1 TABLET (5 MG TOTAL) BY MOUTH DAILY. 30 tablet 0   ampicillin (PRINCIPEN) 500 MG capsule Take 500 mg by mouth 2 (two) times daily as needed. (Patient not taking: Reported on 07/04/2023)     apixaban (ELIQUIS) 5 MG TABS tablet Take 1 tablet (5 mg total) by mouth 2 (two) times daily. 60 tablet 5   ferrous sulfate 325 (65 FE) MG tablet Take 325 mg by mouth daily.     finasteride (PROSCAR) 5 MG tablet Take 5 mg by mouth.     furosemide (LASIX) 40 MG tablet Take 0.5 tablets (20 mg total) by mouth daily.     Melatonin 10 MG CAPS Take 10 mg nightly 30 capsule 5   metoprolol succinate (TOPROL-XL) 50 MG 24 hr tablet TAKE 1 TABLET DAILY WITH OR IMMEDIATELY FOLLOWING A MEAL. PATIENT *NEED OFFICE VISIT [phone]. 90 tablet 0   ondansetron (ZOFRAN) 8 MG tablet Take 1 tablet (8 mg total) by mouth every 8 (eight) hours as needed for nausea or vomiting. 30 tablet 1   prochlorperazine (COMPAZINE) 10 MG tablet Take 1 tablet (10 mg total) by mouth every 6 (six) hours as needed for nausea or vomiting. 30 tablet 1   tamsulosin (FLOMAX) 0.4 MG CAPS capsule Take 1 capsule (0.4 mg total) by mouth daily. 30 capsule 0   valsartan (DIOVAN) 80 MG tablet Take 1 tablet (80 mg total) by mouth daily. 90 tablet 1   No current facility-administered medications for this visit.   Facility-Administered Medications Ordered in Other Visits  Medication Dose Route Frequency Provider Last Rate Last Admin    0.9 %  sodium chloride infusion   Intravenous Continuous Melven Sartorius, MD   Stopped at 10/04/23 1505    HISTORY OF PRESENT ILLNESS:   Oncology History  Malignant melanoma (HCC)  01/01/2015 Initial Diagnosis   Malignant melanoma (HCC)   12/2014 Surgery   April 2016 WLE and SLN 01/31/2015: Reexcision    02/25/2018 Imaging   CT CAP Subcentimeter right upper lobe subpleural and left upper lobe perifissural nodules, new from 01/24/2016, indeterminate. Recommend short interval follow-up (3 months), given histor    04/07/2019 Imaging   CT Chest: No evidence of metastatic disease within the chest. Left upper lobe perifissural nodule no longer evident. Right upper lobe subpleural nodule no longer evident.  CT AP No evidence of metastatic disease.     04/2020 Pathology Results   Summer 2021: New scalp lesion, in transit  path: -Malignant melanoma, involving dermal tissue    04/12/2020 Imaging   CT chest: NED CT AP: NED    05/04/2020 Imaging   CT Neck: No mass or lymphadenopathy within the neck.    05/25/2020 - 07/25/2020 Chemotherapy   5 cycles of TVEC   01/24/2021 PET scan   PET whole body - No abnormal foci of increased radiotracer uptake to suggest metastatic disease.  -Small focus of increased uptake within the proximal sigmoid colon without adjacent stranding or definite wall thickening, indeterminant. Recommend further evaluation with colonoscopy if clinically    08/24/2021 PET scan   PET whole body - No abnormal foci of increased radiotracer uptake to suggest metastatic disease.   - Small focus of increased uptake within the proximal sigmoid colon, similar to 01/24/2021. Recommend clinical correlation and/or further evaluation with colonoscopy if clinically indicated    01/31/2022 Surgery   (Outside case, [id number], 1 slide, 01/23/2022) Skin, left inferior lateral anterior scalp, shave - Metastatic malignant melanoma Size: 2 mm Pattern: Pigmented epithelioid cells forming  small plaque in superficial dermis and showing epidermotropism Tumor infiltrating lymphocytes: Brisk Nuclear grade: 3/3 Margins: Focally present adjacent to edge of tissue sections    02/21/2022 - 05/09/2022 Chemotherapy   had an intransit recurrence of melanoma and was retreated with TVEC.  Biopsy in October 2023 showed NED.    05/09/2022 PET scan   - Mildly avid 3 mm right upper lobe subcentimeter pulmonary nodule, possibly increased from 2 mm on prior; recommend attention on follow-up.   -No definite evidence of metabolically active disease.    05/28/2022 Pathology Results   Diagnosis   Skin, Left temple, excision - Dermal fibrosis with lymphocytic and granulomatous inflammation consistent with site of recent therapy - No residual melanoma identified - Margins free of tumor      02/15/2023 PET scan   - Focal area of hypermetabolic activity is seen in the ascending colon, indeterminate. Recommend correlation with colonoscopy.   -Unchanged size of mildly hypermetabolic left paratracheal lymph node, indeterminate. Recommend attention on follow-up imaging.   -Pancreatic calcifications likely reflect sequela of chronic pancreatitis.   -Bladder diverticuli and enlarged prostate.    05/13/2023 PET scan   1.  Evidence of progressive disease as demonstrated by interval enlargement of right upper lobe pulmonary nodule most concerning for metastasis by melanoma. Additional single right pulmonary nodule and 2 sites of focal osseous avidity which are suspicious but indeterminate for additional sites of involvement by metastasis involving the T8 and L1 vertebral bodies.  2.  Focal uptake within the sigmoid colon which has increased in conspicuity over time. Recommend correlation with colonoscopy if not recently completed with differential diagnosis including adenoma, carcinoma, and inflammatory lesion (given associated coarse calcifications/high density material). No evidence of complication (i.e.   fluid collection, or free air) by low-dose unenhanced CT.  3.  Persistent FDG abnormality of the left anterior shin associated with a dermal nodule. Recommend direct inspection as additional site of cutaneous neoplasm is not excluded.    06/14/2023 Pathology Results   A: Lung, right upper lobe, lobectomy  - Metastatic melanoma, multifocal (largest focus 2.4 cm, with two sub-centimeter nodules also identified microscopically, each 3 mm) - Surgical margins negative  - BRAF (VE1): Negative - TILs: brisk - Two lymph nodes, negative for malignancy (0/2)   07/17/2023 Genetic Testing   [place]: pertinent  negatives: BRAF. KIT TERT C.-124C>T variant- promoter mutation NRAS p.G13D Missense variant (exon 2) - GOF TP53 p.R342P ARID1A p.G243fs NF1 p.R1362* PHLPP2 p.Q300* NF1 p.K191_R192delinsN*  Multiple VUS findings.   08/05/2023 Imaging   MRI brain  --Single enhancing right occipital cortical lesion is most concerning for intracranial metastatic disease.   --Indeterminate 7 mm enhancing lesion in the right posterior parietal bone. There is no definite FDG avidity in this region on the recent PET/CT, arguing against malignancy, however close attention to this area on follow-up imaging is recommended.    08/05/2023 Imaging   CT chest IMPRESSION:   -Right upper lobectomy with moderate sized loculated right pleural effusion. New 0.3 cm right lower lobe micronodule. Recommend attention on follow-up CT in 3 months.   Few subcentimeter right infra-axillary lymph nodes with adjacent fat stranding and benign morphology, new since prior. Recommendation follow-up CT.   Markedly enlarged left atrium and mild qualitative dilatation of right atrium. Extensive coronary artery calcifications.    09/06/2023 -  Chemotherapy   Patient is on Treatment Plan : METASTATIC MELANOMA Nivolumab (240) q14d     09/19/2023 Cancer Staging   Staging form: Melanoma of the Skin, AJCC 7th Edition - Clinical: Stage IV (TX,  N0, M1c) - Signed by Melven Sartorius, MD on 09/19/2023 Biopsy of metastatic site performed: Yes Source of metastatic specimen: Lung       REVIEW OF SYSTEMS:   All relevant systems were reviewed with the patient and are negative.   VITALS:  Blood pressure 134/65, pulse 73, temperature 97.7 F (36.5 C), temperature source Temporal, resp. rate 16, height 5' 10.5" (1.791 m), weight 176 lb 14.4 oz (80.2 kg), SpO2 100%.  Wt Readings from Last 3 Encounters:  10/04/23 176 lb 14.4 oz (80.2 kg)  09/20/23 176 lb 4.8 oz (80 kg)  09/06/23 178 lb 9.6 oz (81 kg)    Body mass index is 25.02 kg/m.  Performance status (ECOG): 1 - Symptomatic but completely ambulatory  PHYSICAL EXAM:   GENERAL:alert, no distress and comfortable SKIN: facial rash unchanged. Left temporal bandage  EYES: normal, sclera clear OROPHARYNX: no exudate, no erythema    NECK: supple,  non-tender, without nodularity LYMPH:  no palpable cervical lymphadenopathy LUNGS: clear to auscultation with normal breathing effort.  No wheeze or rales HEART: regular rate & rhythm and no murmurs and no lower extremity edema ABDOMEN: abdomen soft, non-tender and nondistended Musculoskeletal: no edema NEURO: alert, fluent speech, no focal motor/sensory deficits.  Strength and sensation equal bilaterally.  LABORATORY DATA:  I have reviewed the data as listed    Component Value Date/Time   NA 132 (L) 10/04/2023 1236   NA 126 (A) 06/28/2023 0000   K 3.8 10/04/2023 1236   CL 98 10/04/2023 1236   CO2 26 10/04/2023 1236   GLUCOSE 163 (H) 10/04/2023 1236   BUN 14 10/04/2023 1236   BUN 11 06/28/2023 0000   CREATININE 0.82 10/04/2023 1236   CREATININE 1.49 (H) 06/06/2020 1022   CALCIUM 9.7 10/04/2023 1236   PROT 7.3 10/04/2023 1236   PROT 6.9 02/20/2021 0920   ALBUMIN 4.4 10/04/2023 1236   ALBUMIN 4.6 02/20/2021 0920   AST 34 10/04/2023 1236   ALT 39 10/04/2023 1236   ALKPHOS 111 10/04/2023 1236   BILITOT 1.0 10/04/2023 1236    GFRNONAA >60 10/04/2023 1236   GFRAA 53 05/10/2020 0000    No results found for: "SPEP", "UPEP"  Lab Results  Component Value Date   WBC 5.5 10/04/2023  NEUTROABS 4.0 10/04/2023   HGB 14.0 10/04/2023   HCT 40.5 10/04/2023   MCV 97.8 10/04/2023   PLT 228 10/04/2023      Chemistry      Component Value Date/Time   NA 132 (L) 10/04/2023 1236   NA 126 (A) 06/28/2023 0000   K 3.8 10/04/2023 1236   CL 98 10/04/2023 1236   CO2 26 10/04/2023 1236   BUN 14 10/04/2023 1236   BUN 11 06/28/2023 0000   CREATININE 0.82 10/04/2023 1236   CREATININE 1.49 (H) 06/06/2020 1022   GLU 103 06/28/2023 0000      Component Value Date/Time   CALCIUM 9.7 10/04/2023 1236   ALKPHOS 111 10/04/2023 1236   AST 34 10/04/2023 1236   ALT 39 10/04/2023 1236   BILITOT 1.0 10/04/2023 1236       RADIOGRAPHIC STUDIES: I have personally reviewed the radiological images as listed and agreed with the findings in the report. No results found.

## 2023-10-04 NOTE — Patient Instructions (Signed)
 [facility]  Discharge Instructions: Thank you for choosing [facility] to provide your oncology and hematology care.   If you have a lab appointment with the Cancer Center, please go directly to the Cancer Center and check in at the registration area.   Wear comfortable clothing and clothing appropriate for easy access to any Portacath or PICC line.   We strive to give you quality time with your provider. You may need to reschedule your appointment if you arrive late (15 or more minutes).  Arriving late affects you and other patients whose appointments are after yours.  Also, if you miss three or more appointments without notifying the office, you may be dismissed from the clinic at the provider's discretion.      For prescription refill requests, have your pharmacy contact our office and allow 72 hours for refills to be completed.    Today you received the following chemotherapy and/or immunotherapy agents opdivo      To help prevent nausea and vomiting after your treatment, we encourage you to take your nausea medication as directed.  BELOW ARE SYMPTOMS THAT SHOULD BE REPORTED IMMEDIATELY: *FEVER GREATER THAN 100.4 F (38 C) OR HIGHER *CHILLS OR SWEATING *NAUSEA AND VOMITING THAT IS NOT CONTROLLED WITH YOUR NAUSEA MEDICATION *UNUSUAL SHORTNESS OF BREATH *UNUSUAL BRUISING OR BLEEDING *URINARY PROBLEMS (pain or burning when urinating, or frequent urination) *BOWEL PROBLEMS (unusual diarrhea, constipation, pain near the anus) TENDERNESS IN MOUTH AND THROAT WITH OR WITHOUT PRESENCE OF ULCERS (sore throat, sores in mouth, or a toothache) UNUSUAL RASH, SWELLING OR PAIN  UNUSUAL VAGINAL DISCHARGE OR ITCHING   Items with * indicate a potential emergency and should be followed up as soon as possible or go to the Emergency Department if any problems should occur.  Please show the CHEMOTHERAPY ALERT CARD or IMMUNOTHERAPY  ALERT CARD at check-in to the Emergency Department and triage nurse.  Should you have questions after your visit or need to cancel or reschedule your appointment, please contact [facility] A [facility]  Dept: [phone]  and follow the prompts.  Office hours are 8:00 a.m. to 4:30 p.m. Monday - Friday. Please note that voicemails left after 4:00 p.m. may not be returned until the following business day.  We are closed weekends and major holidays. You have access to a nurse at all times for urgent questions. Please call the main number to the clinic Dept: [phone] and follow the prompts.   For any non-urgent questions, you may also contact your provider using MyChart. We now offer e-Visits for anyone 74 and older to request care online for non-urgent symptoms. For details visit [email or web address].   Also download the MyChart app! Go to the app store, search "MyChart", open the app, select [facility], and log in with your MyChart username and password.

## 2023-10-05 ENCOUNTER — Other Ambulatory Visit: Payer: Self-pay | Admitting: Family Medicine

## 2023-10-06 LAB — T4: T4, Total: 7.6 ug/dL (ref 4.5–12.0)

## 2023-10-07 ENCOUNTER — Other Ambulatory Visit: Payer: Self-pay | Admitting: *Deleted

## 2023-10-07 MED ORDER — MELATONIN 10 MG PO CAPS: 10.0000 mg | ORAL_CAPSULE | Freq: Every evening | ORAL | 1 refills | Status: DC

## 2023-10-17 ENCOUNTER — Inpatient Hospital Stay: Payer: Medicare HMO | Admitting: Dietician

## 2023-10-17 ENCOUNTER — Inpatient Hospital Stay: Payer: Medicare HMO

## 2023-10-17 ENCOUNTER — Ambulatory Visit: Payer: Medicare HMO | Admitting: Nurse Practitioner

## 2023-10-17 ENCOUNTER — Other Ambulatory Visit: Payer: Medicare HMO

## 2023-10-17 VITALS — BP 135/66 | HR 70 | Temp 97.8°F | Resp 16

## 2023-10-17 DIAGNOSIS — C7800 Secondary malignant neoplasm of unspecified lung: Secondary | ICD-10-CM | POA: Diagnosis not present

## 2023-10-17 DIAGNOSIS — Z5112 Encounter for antineoplastic immunotherapy: Secondary | ICD-10-CM | POA: Insufficient documentation

## 2023-10-17 DIAGNOSIS — C438 Malignant melanoma of overlapping sites of skin: Secondary | ICD-10-CM

## 2023-10-17 DIAGNOSIS — C787 Secondary malignant neoplasm of liver and intrahepatic bile duct: Secondary | ICD-10-CM | POA: Diagnosis not present

## 2023-10-17 DIAGNOSIS — C7951 Secondary malignant neoplasm of bone: Secondary | ICD-10-CM | POA: Insufficient documentation

## 2023-10-17 DIAGNOSIS — Z79899 Other long term (current) drug therapy: Secondary | ICD-10-CM | POA: Diagnosis not present

## 2023-10-17 DIAGNOSIS — C7931 Secondary malignant neoplasm of brain: Secondary | ICD-10-CM | POA: Insufficient documentation

## 2023-10-17 DIAGNOSIS — C434 Malignant melanoma of scalp and neck: Secondary | ICD-10-CM | POA: Diagnosis not present

## 2023-10-17 LAB — CMP (CANCER CENTER ONLY)
ALT: 42 U/L (ref 0–44)
AST: 37 U/L (ref 15–41)
Albumin: 4.2 g/dL (ref 3.5–5.0)
Alkaline Phosphatase: 103 U/L (ref 38–126)
Anion gap: 5 (ref 5–15)
BUN: 16 mg/dL (ref 8–23)
CO2: 28 mmol/L (ref 22–32)
Calcium: 9.7 mg/dL (ref 8.9–10.3)
Chloride: 99 mmol/L (ref 98–111)
Creatinine: 1.01 mg/dL (ref 0.61–1.24)
GFR, Estimated: >60 mL/min (ref >60)
Glucose, Bld: 169 mg/dL — ABNORMAL HIGH (ref 70–99)
Potassium: 3.9 mmol/L (ref 3.5–5.1)
Sodium: 132 mmol/L — ABNORMAL LOW (ref 135–145)
Total Bilirubin: 0.8 mg/dL (ref 0.0–1.2)
Total Protein: 7.2 g/dL (ref 6.5–8.1)

## 2023-10-17 LAB — CBC WITH DIFFERENTIAL (CANCER CENTER ONLY)
Abs Immature Granulocytes: 0.01 10*3/uL (ref 0.00–0.07)
Basophils Absolute: 0 10*3/uL (ref 0.0–0.1)
Basophils Relative: 1 %
Eosinophils Absolute: 0.1 10*3/uL (ref 0.0–0.5)
Eosinophils Relative: 1 %
HCT: 39.2 % (ref 39.0–52.0)
Hemoglobin: 13.5 g/dL (ref 13.0–17.0)
Immature Granulocytes: 0 %
Lymphocytes Relative: 14 %
Lymphs Abs: 0.9 10*3/uL (ref 0.7–4.0)
MCH: 33.2 pg (ref 26.0–34.0)
MCHC: 34.4 g/dL (ref 30.0–36.0)
MCV: 96.3 fL (ref 80.0–100.0)
Monocytes Absolute: 0.6 10*3/uL (ref 0.1–1.0)
Monocytes Relative: 10 %
Neutro Abs: 4.5 10*3/uL (ref 1.7–7.7)
Neutrophils Relative %: 74 %
Platelet Count: 217 10*3/uL (ref 150–400)
RBC: 4.07 MIL/uL — ABNORMAL LOW (ref 4.22–5.81)
RDW: 13.8 % (ref 11.5–15.5)
WBC Count: 6.1 10*3/uL (ref 4.0–10.5)
nRBC: 0 % (ref 0.0–0.2)

## 2023-10-17 LAB — LACTATE DEHYDROGENASE: LDH: 129 U/L (ref 98–192)

## 2023-10-17 LAB — VITAMIN B12: Vitamin B-12: 335 pg/mL (ref 180–914)

## 2023-10-17 MED ORDER — SODIUM CHLORIDE 0.9 % IV SOLN
240.0000 mg | Freq: Once | INTRAVENOUS | Status: AC
  Administered 2023-10-17: 240 mg via INTRAVENOUS
  Filled 2023-10-17: qty 24

## 2023-10-17 MED ORDER — SODIUM CHLORIDE 0.9 % IV SOLN: INTRAVENOUS | Status: DC

## 2023-10-17 NOTE — Progress Notes (Signed)
Nutrition Follow-up:  Pt with metastatic melanoma to brain, lung, bone, and liver. He is receiving nivolumab q14d (first 09/06/23)   Met with patient in infusion. He is in good spirits today. Reports increased appetite and has implemented nutrition recommendations with snacks and daily Ensure. Patient had rye toast with butter, sausage, coffee for breakfast. Recalls fried catfish, mashed potatoes, asparagus for dinner last night. Patient reports drinking one non-alcoholic beer in the evenings instead of glass of wine. He is enjoying this and feels this helps boost appetite as well. Patient denies nutrition impact symptoms at this time.    Medications: reviewed   Labs: Na 132, glucose 169  Anthropometrics: Last wt 176 lb 14.4 oz on 1/31 - stable  1/17 - 176 lb 4.8 oz     NUTRITION DIAGNOSIS: Food and nutrition related knowledge deficit - improved     INTERVENTION:  Continue high protein snacks in between meals Continue daily Ensure Complete     MONITORING, EVALUATION, GOAL: wt trends, intake   NEXT VISIT: Friday March 14 during infusion

## 2023-10-17 NOTE — Patient Instructions (Signed)
[facility] - A DEPT OF [facility]  Discharge Instructions: Thank you for choosing [facility] to provide your oncology and hematology care.   If you have a lab appointment with the Cancer Center, please go directly to the Cancer Center and check in at the registration area.   Wear comfortable clothing and clothing appropriate for easy access to any Portacath or PICC line.   We strive to give you quality time with your provider. You may need to reschedule your appointment if you arrive late (15 or more minutes).  Arriving late affects you and other patients whose appointments are after yours.  Also, if you miss three or more appointments without notifying the office, you may be dismissed from the clinic at the provider's discretion.      For prescription refill requests, have your pharmacy contact our office and allow 72 hours for refills to be completed.    Today you received the following chemotherapy and/or immunotherapy agents: Opdivo      To help prevent nausea and vomiting after your treatment, we encourage you to take your nausea medication as directed.  BELOW ARE SYMPTOMS THAT SHOULD BE REPORTED IMMEDIATELY: *FEVER GREATER THAN 100.4 F (38 C) OR HIGHER *CHILLS OR SWEATING *NAUSEA AND VOMITING THAT IS NOT CONTROLLED WITH YOUR NAUSEA MEDICATION *UNUSUAL SHORTNESS OF BREATH *UNUSUAL BRUISING OR BLEEDING *URINARY PROBLEMS (pain or burning when urinating, or frequent urination) *BOWEL PROBLEMS (unusual diarrhea, constipation, pain near the anus) TENDERNESS IN MOUTH AND THROAT WITH OR WITHOUT PRESENCE OF ULCERS (sore throat, sores in mouth, or a toothache) UNUSUAL RASH, SWELLING OR PAIN  UNUSUAL VAGINAL DISCHARGE OR ITCHING   Items with * indicate a potential emergency and should be followed up as soon as possible or go to the Emergency Department if any problems should occur.  Please show the CHEMOTHERAPY ALERT CARD or IMMUNOTHERAPY  ALERT CARD at check-in to the Emergency Department and triage nurse.  Should you have questions after your visit or need to cancel or reschedule your appointment, please contact [facility] - [facility]  Dept: [phone]  and follow the prompts.  Office hours are 8:00 a.m. to 4:30 p.m. Monday - Friday. Please note that voicemails left after 4:00 p.m. may not be returned until the following business day.  We are closed weekends and major holidays. You have access to a nurse at all times for urgent questions. Please call the main number to the clinic Dept: [phone] and follow the prompts.   For any non-urgent questions, you may also contact your provider using MyChart. We now offer e-Visits for anyone 57 and older to request care online for non-urgent symptoms. For details visit [email or web address].   Also download the MyChart app! Go to the app store, search "MyChart", open the app, select , and log in with your MyChart username and password.

## 2023-10-19 LAB — METHYLMALONIC ACID, SERUM: Methylmalonic Acid, Quantitative: 301 nmol/L (ref 0–378)

## 2023-10-29 ENCOUNTER — Other Ambulatory Visit: Payer: Self-pay | Admitting: Internal Medicine

## 2023-10-31 ENCOUNTER — Inpatient Hospital Stay: Payer: Medicare HMO

## 2023-10-31 ENCOUNTER — Ambulatory Visit: Payer: Medicare HMO | Admitting: Nurse Practitioner

## 2023-10-31 VITALS — BP 126/74 | HR 67 | Temp 97.6°F | Resp 18 | Wt 179.8 lb

## 2023-10-31 DIAGNOSIS — C438 Malignant melanoma of overlapping sites of skin: Secondary | ICD-10-CM

## 2023-10-31 DIAGNOSIS — C787 Secondary malignant neoplasm of liver and intrahepatic bile duct: Secondary | ICD-10-CM | POA: Diagnosis not present

## 2023-10-31 DIAGNOSIS — C434 Malignant melanoma of scalp and neck: Secondary | ICD-10-CM | POA: Diagnosis not present

## 2023-10-31 DIAGNOSIS — Z79899 Other long term (current) drug therapy: Secondary | ICD-10-CM | POA: Diagnosis not present

## 2023-10-31 DIAGNOSIS — C7800 Secondary malignant neoplasm of unspecified lung: Secondary | ICD-10-CM | POA: Diagnosis not present

## 2023-10-31 DIAGNOSIS — C7951 Secondary malignant neoplasm of bone: Secondary | ICD-10-CM | POA: Diagnosis not present

## 2023-10-31 DIAGNOSIS — Z5112 Encounter for antineoplastic immunotherapy: Secondary | ICD-10-CM | POA: Diagnosis not present

## 2023-10-31 DIAGNOSIS — D539 Nutritional anemia, unspecified: Secondary | ICD-10-CM

## 2023-10-31 DIAGNOSIS — C7931 Secondary malignant neoplasm of brain: Secondary | ICD-10-CM | POA: Diagnosis not present

## 2023-10-31 LAB — CBC WITH DIFFERENTIAL (CANCER CENTER ONLY)
Abs Immature Granulocytes: 0.01 10*3/uL (ref 0.00–0.07)
Basophils Absolute: 0 10*3/uL (ref 0.0–0.1)
Basophils Relative: 1 %
Eosinophils Absolute: 0.1 10*3/uL (ref 0.0–0.5)
Eosinophils Relative: 2 %
HCT: 37.7 % — ABNORMAL LOW (ref 39.0–52.0)
Hemoglobin: 13 g/dL (ref 13.0–17.0)
Immature Granulocytes: 0 %
Lymphocytes Relative: 14 %
Lymphs Abs: 0.8 10*3/uL (ref 0.7–4.0)
MCH: 33.1 pg (ref 26.0–34.0)
MCHC: 34.5 g/dL (ref 30.0–36.0)
MCV: 95.9 fL (ref 80.0–100.0)
Monocytes Absolute: 0.7 10*3/uL (ref 0.1–1.0)
Monocytes Relative: 12 %
Neutro Abs: 4.2 10*3/uL (ref 1.7–7.7)
Neutrophils Relative %: 71 %
Platelet Count: 187 10*3/uL (ref 150–400)
RBC: 3.93 MIL/uL — ABNORMAL LOW (ref 4.22–5.81)
RDW: 14.2 % (ref 11.5–15.5)
WBC Count: 5.9 10*3/uL (ref 4.0–10.5)
nRBC: 0 % (ref 0.0–0.2)

## 2023-10-31 LAB — CMP (CANCER CENTER ONLY)
ALT: 29 U/L (ref 0–44)
AST: 29 U/L (ref 15–41)
Albumin: 4.2 g/dL (ref 3.5–5.0)
Alkaline Phosphatase: 91 U/L (ref 38–126)
Anion gap: 6 (ref 5–15)
BUN: 17 mg/dL (ref 8–23)
CO2: 27 mmol/L (ref 22–32)
Calcium: 9.5 mg/dL (ref 8.9–10.3)
Chloride: 99 mmol/L (ref 98–111)
Creatinine: 1.09 mg/dL (ref 0.61–1.24)
GFR, Estimated: >60 mL/min (ref >60)
Glucose, Bld: 148 mg/dL — ABNORMAL HIGH (ref 70–99)
Potassium: 4 mmol/L (ref 3.5–5.1)
Sodium: 132 mmol/L — ABNORMAL LOW (ref 135–145)
Total Bilirubin: 1.1 mg/dL (ref 0.0–1.2)
Total Protein: 7.2 g/dL (ref 6.5–8.1)

## 2023-10-31 LAB — LACTATE DEHYDROGENASE: LDH: 120 U/L (ref 98–192)

## 2023-10-31 LAB — T4, FREE: Free T4: 1.03 ng/dL (ref 0.61–1.12)

## 2023-10-31 LAB — TSH: TSH: 1.829 u[IU]/mL (ref 0.350–4.500)

## 2023-10-31 MED ORDER — HEPARIN SOD (PORK) LOCK FLUSH 100 UNIT/ML IV SOLN: 500.0000 [IU] | Freq: Once | INTRAVENOUS | Status: DC | PRN

## 2023-10-31 MED ORDER — SODIUM CHLORIDE 0.9 % IV SOLN: INTRAVENOUS | Status: DC

## 2023-10-31 MED ORDER — SODIUM CHLORIDE 0.9% FLUSH
10.0000 mL | INTRAVENOUS | Status: DC | PRN
Start: 2023-10-31 — End: 2023-10-31

## 2023-10-31 MED ORDER — SODIUM CHLORIDE 0.9 % IV SOLN
240.0000 mg | Freq: Once | INTRAVENOUS | Status: AC
  Administered 2023-10-31: 240 mg via INTRAVENOUS
  Filled 2023-10-31: qty 24

## 2023-10-31 NOTE — Patient Instructions (Signed)
 [facility] - A DEPT OF [facility]  Discharge Instructions: Thank you for choosing [facility] to provide your oncology and hematology care.   If you have a lab appointment with the Cancer Center, please go directly to the Cancer Center and check in at the registration area.   Wear comfortable clothing and clothing appropriate for easy access to any Portacath or PICC line.   We strive to give you quality time with your provider. You may need to reschedule your appointment if you arrive late (15 or more minutes).  Arriving late affects you and other patients whose appointments are after yours.  Also, if you miss three or more appointments without notifying the office, you may be dismissed from the clinic at the provider's discretion.      For prescription refill requests, have your pharmacy contact our office and allow 72 hours for refills to be completed.    Today you received the following chemotherapy and/or immunotherapy agents: Opdivo      To help prevent nausea and vomiting after your treatment, we encourage you to take your nausea medication as directed.  BELOW ARE SYMPTOMS THAT SHOULD BE REPORTED IMMEDIATELY: *FEVER GREATER THAN 100.4 F (38 C) OR HIGHER *CHILLS OR SWEATING *NAUSEA AND VOMITING THAT IS NOT CONTROLLED WITH YOUR NAUSEA MEDICATION *UNUSUAL SHORTNESS OF BREATH *UNUSUAL BRUISING OR BLEEDING *URINARY PROBLEMS (pain or burning when urinating, or frequent urination) *BOWEL PROBLEMS (unusual diarrhea, constipation, pain near the anus) TENDERNESS IN MOUTH AND THROAT WITH OR WITHOUT PRESENCE OF ULCERS (sore throat, sores in mouth, or a toothache) UNUSUAL RASH, SWELLING OR PAIN  UNUSUAL VAGINAL DISCHARGE OR ITCHING   Items with * indicate a potential emergency and should be followed up as soon as possible or go to the Emergency Department if any problems should occur.  Please show the CHEMOTHERAPY ALERT CARD or IMMUNOTHERAPY  ALERT CARD at check-in to the Emergency Department and triage nurse.  Should you have questions after your visit or need to cancel or reschedule your appointment, please contact [facility]  Dept: [phone]  and follow the prompts.  Office hours are 8:00 a.m. to 4:30 p.m. Monday - Friday. Please note that voicemails left after 4:00 p.m. may not be returned until the following business day.  We are closed weekends and major holidays. You have access to a nurse at all times for urgent questions. Please call the main number to the clinic Dept: [phone] and follow the prompts.   For any non-urgent questions, you may also contact your provider using MyChart. We now offer e-Visits for anyone 57 and older to request care online for non-urgent symptoms. For details visit [email or web address].   Also download the MyChart app! Go to the app store, search "MyChart", open the app, select , and log in with your MyChart username and password.

## 2023-11-08 DIAGNOSIS — N401 Enlarged prostate with lower urinary tract symptoms: Secondary | ICD-10-CM | POA: Diagnosis not present

## 2023-11-08 DIAGNOSIS — R338 Other retention of urine: Secondary | ICD-10-CM | POA: Diagnosis not present

## 2023-11-12 ENCOUNTER — Other Ambulatory Visit: Payer: Self-pay | Admitting: Internal Medicine

## 2023-11-15 ENCOUNTER — Telehealth: Payer: Self-pay

## 2023-11-15 ENCOUNTER — Inpatient Hospital Stay: Payer: Medicare HMO

## 2023-11-15 ENCOUNTER — Inpatient Hospital Stay: Payer: Medicare HMO | Admitting: Dietician

## 2023-11-15 VITALS — BP 132/68 | HR 63 | Temp 97.6°F | Resp 16 | Ht 70.5 in | Wt 178.6 lb

## 2023-11-15 DIAGNOSIS — C7951 Secondary malignant neoplasm of bone: Secondary | ICD-10-CM | POA: Diagnosis not present

## 2023-11-15 DIAGNOSIS — Z79899 Other long term (current) drug therapy: Secondary | ICD-10-CM | POA: Diagnosis not present

## 2023-11-15 DIAGNOSIS — C438 Malignant melanoma of overlapping sites of skin: Secondary | ICD-10-CM

## 2023-11-15 DIAGNOSIS — Z85820 Personal history of malignant melanoma of skin: Secondary | ICD-10-CM | POA: Insufficient documentation

## 2023-11-15 DIAGNOSIS — C7931 Secondary malignant neoplasm of brain: Secondary | ICD-10-CM | POA: Diagnosis not present

## 2023-11-15 DIAGNOSIS — Z7901 Long term (current) use of anticoagulants: Secondary | ICD-10-CM | POA: Diagnosis not present

## 2023-11-15 DIAGNOSIS — C434 Malignant melanoma of scalp and neck: Secondary | ICD-10-CM | POA: Insufficient documentation

## 2023-11-15 DIAGNOSIS — Z5112 Encounter for antineoplastic immunotherapy: Secondary | ICD-10-CM | POA: Insufficient documentation

## 2023-11-15 DIAGNOSIS — I10 Essential (primary) hypertension: Secondary | ICD-10-CM | POA: Insufficient documentation

## 2023-11-15 DIAGNOSIS — C787 Secondary malignant neoplasm of liver and intrahepatic bile duct: Secondary | ICD-10-CM | POA: Insufficient documentation

## 2023-11-15 DIAGNOSIS — I4891 Unspecified atrial fibrillation: Secondary | ICD-10-CM | POA: Diagnosis not present

## 2023-11-15 DIAGNOSIS — I2510 Atherosclerotic heart disease of native coronary artery without angina pectoris: Secondary | ICD-10-CM | POA: Insufficient documentation

## 2023-11-15 DIAGNOSIS — E785 Hyperlipidemia, unspecified: Secondary | ICD-10-CM | POA: Diagnosis not present

## 2023-11-15 LAB — CMP (CANCER CENTER ONLY)
ALT: 22 U/L (ref 0–44)
AST: 24 U/L (ref 15–41)
Albumin: 4.5 g/dL (ref 3.5–5.0)
Alkaline Phosphatase: 88 U/L (ref 38–126)
Anion gap: 7 (ref 5–15)
BUN: 16 mg/dL (ref 8–23)
CO2: 28 mmol/L (ref 22–32)
Calcium: 9.8 mg/dL (ref 8.9–10.3)
Chloride: 98 mmol/L (ref 98–111)
Creatinine: 0.95 mg/dL (ref 0.61–1.24)
GFR, Estimated: >60 mL/min (ref >60)
Glucose, Bld: 153 mg/dL — ABNORMAL HIGH (ref 70–99)
Potassium: 3.9 mmol/L (ref 3.5–5.1)
Sodium: 133 mmol/L — ABNORMAL LOW (ref 135–145)
Total Bilirubin: 1.3 mg/dL — ABNORMAL HIGH (ref 0.0–1.2)
Total Protein: 7.7 g/dL (ref 6.5–8.1)

## 2023-11-15 LAB — CBC WITH DIFFERENTIAL (CANCER CENTER ONLY)
Abs Immature Granulocytes: 0.03 10*3/uL (ref 0.00–0.07)
Basophils Absolute: 0 10*3/uL (ref 0.0–0.1)
Basophils Relative: 0 %
Eosinophils Absolute: 0.1 10*3/uL (ref 0.0–0.5)
Eosinophils Relative: 1 %
HCT: 38.3 % — ABNORMAL LOW (ref 39.0–52.0)
Hemoglobin: 13.2 g/dL (ref 13.0–17.0)
Immature Granulocytes: 1 %
Lymphocytes Relative: 14 %
Lymphs Abs: 0.9 10*3/uL (ref 0.7–4.0)
MCH: 33.6 pg (ref 26.0–34.0)
MCHC: 34.5 g/dL (ref 30.0–36.0)
MCV: 97.5 fL (ref 80.0–100.0)
Monocytes Absolute: 0.7 10*3/uL (ref 0.1–1.0)
Monocytes Relative: 12 %
Neutro Abs: 4.5 10*3/uL (ref 1.7–7.7)
Neutrophils Relative %: 72 %
Platelet Count: 220 10*3/uL (ref 150–400)
RBC: 3.93 MIL/uL — ABNORMAL LOW (ref 4.22–5.81)
RDW: 14.8 % (ref 11.5–15.5)
WBC Count: 6.1 10*3/uL (ref 4.0–10.5)
nRBC: 0 % (ref 0.0–0.2)

## 2023-11-15 LAB — LACTATE DEHYDROGENASE: LDH: 130 U/L (ref 98–192)

## 2023-11-15 LAB — T4, FREE: Free T4: 1.06 ng/dL (ref 0.61–1.12)

## 2023-11-15 LAB — TSH: TSH: 2.203 u[IU]/mL (ref 0.350–4.500)

## 2023-11-15 MED ORDER — SODIUM CHLORIDE 0.9 % IV SOLN
240.0000 mg | Freq: Once | INTRAVENOUS | Status: AC
  Administered 2023-11-15: 240 mg via INTRAVENOUS
  Filled 2023-11-15: qty 24

## 2023-11-15 MED ORDER — SODIUM CHLORIDE 0.9 % IV SOLN: INTRAVENOUS | Status: DC

## 2023-11-15 NOTE — Patient Instructions (Signed)
 [facility] - A DEPT OF [facility]  Discharge Instructions: Thank you for choosing [facility] to provide your oncology and hematology care.   If you have a lab appointment with the Cancer Center, please go directly to the Cancer Center and check in at the registration area.   Wear comfortable clothing and clothing appropriate for easy access to any Portacath or PICC line.   We strive to give you quality time with your provider. You may need to reschedule your appointment if you arrive late (15 or more minutes).  Arriving late affects you and other patients whose appointments are after yours.  Also, if you miss three or more appointments without notifying the office, you may be dismissed from the clinic at the provider's discretion.      For prescription refill requests, have your pharmacy contact our office and allow 72 hours for refills to be completed.    Today you received the following chemotherapy and/or immunotherapy agents: Opdivo      To help prevent nausea and vomiting after your treatment, we encourage you to take your nausea medication as directed.  BELOW ARE SYMPTOMS THAT SHOULD BE REPORTED IMMEDIATELY: *FEVER GREATER THAN 100.4 F (38 C) OR HIGHER *CHILLS OR SWEATING *NAUSEA AND VOMITING THAT IS NOT CONTROLLED WITH YOUR NAUSEA MEDICATION *UNUSUAL SHORTNESS OF BREATH *UNUSUAL BRUISING OR BLEEDING *URINARY PROBLEMS (pain or burning when urinating, or frequent urination) *BOWEL PROBLEMS (unusual diarrhea, constipation, pain near the anus) TENDERNESS IN MOUTH AND THROAT WITH OR WITHOUT PRESENCE OF ULCERS (sore throat, sores in mouth, or a toothache) UNUSUAL RASH, SWELLING OR PAIN  UNUSUAL VAGINAL DISCHARGE OR ITCHING   Items with * indicate a potential emergency and should be followed up as soon as possible or go to the Emergency Department if any problems should occur.  Please show the CHEMOTHERAPY ALERT CARD or IMMUNOTHERAPY  ALERT CARD at check-in to the Emergency Department and triage nurse.  Should you have questions after your visit or need to cancel or reschedule your appointment, please contact [facility]  Dept: [phone]  and follow the prompts.  Office hours are 8:00 a.m. to 4:30 p.m. Monday - Friday. Please note that voicemails left after 4:00 p.m. may not be returned until the following business day.  We are closed weekends and major holidays. You have access to a nurse at all times for urgent questions. Please call the main number to the clinic Dept: [phone] and follow the prompts.   For any non-urgent questions, you may also contact your provider using MyChart. We now offer e-Visits for anyone 57 and older to request care online for non-urgent symptoms. For details visit [email or web address].   Also download the MyChart app! Go to the app store, search "MyChart", open the app, select , and log in with your MyChart username and password.

## 2023-11-15 NOTE — Assessment & Plan Note (Signed)
 Completed SRS to 4 lesions on 08/20/23 Continue nivolumab today Follow up MRI had been ordered and scheduled on 3/18

## 2023-11-15 NOTE — Telephone Encounter (Signed)
 Spoke to [employer] on [address] for PET.  He has liver and bone metastases from melanoma. Especially with bone metastases will be hard to know if patient is responding or with residual disease may be amenable for radiation.  Auth# [id number]. Exp on 05/13/24

## 2023-11-15 NOTE — Progress Notes (Signed)
 Patient Care Team: Ardith Dark, MD as PCP - General (Family Medicine) Duke Salvia, MD as PCP - Electrophysiology (Cardiology) Duke Salvia, MD (Cardiology) Marcine Matar, MD as Attending Physician (Urology) Donzetta Starch, MD as Consulting Physician (Dermatology) Antony Contras, MD as Consulting Physician (Ophthalmology) Hilarie Fredrickson, MD as Consulting Physician (Gastroenterology) Dahlia Byes, [facility] as Pharmacist (Pharmacist) Alejandro Mulling, RN as [facility] Care Management  Clinic Day:  11/15/2023  Referring physician: Melven Sartorius, MD  ASSESSMENT & PLAN:   Assessment & Plan: Nicholas Bird is a 83 y.o.male with history of atrial fibrillation on anticoagulation, hypertension, hyperlipidemia being [facility] cutaneous melanoma. Currently on active treatment with nivolumab.   Initially seen after right UL lobectomy with plan to start immunotherapy for stage 4 melanoma.  During restaging workup in December found to have solitary brain metastasis.  He has now completed SRS. Repeat imaging found bone and liver metastases, asymptomatic.    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 Initial diagnosis: 2016 melanoma of scalp Current treatment: Nivolumab every 2 weeks.   Treatment requires intense monitoring for immune related toxicities with history, physical and labs.  Recommend repeat PET/CT for staging.  He has bone and liver metastases.  CT would be difficult to assess residual activity associated with bone metastases.  Malignant melanoma metastatic to brain [facility]) Completed SRS to 4 lesions on 08/20/23 Continue nivolumab today Follow up MRI had been ordered and scheduled on 3/18  Malignant melanoma Continue nivolumab PET next week Follow up every cycle   Return as scheduled.  The patient understands the plans discussed today and is in agreement with them.  He knows to  contact our office if he develops concerns prior to his next appointment.  Melven Sartorius, MD  [facility] - A DEPT OF [facility] [address] [place] [address] Dept: [phone] Dept Fax: [phone]   No orders of the defined types were placed in this encounter.     CHIEF COMPLAINT:  CC: Stage IV melanoma  Current Treatment: nivolumab  INTERVAL HISTORY:  Nicholas Bird is here today for repeat clinical assessment. He denies coughing, shortness of breath, chest pain, rib pain, bone pain, back pain, abdominal pain, nausea, vomiting, diarrhea, trouble urinating, headaches or neurologic symptoms. His appetite is good.  Reports previously taking stool softener and has stopped.  I have reviewed the past medical history, past surgical history, social history and family history with the patient and they are unchanged from previous note.  ALLERGIES:  is allergic to pertussis vaccines.  MEDICATIONS:  Current Outpatient Medications  Medication Sig Dispense Refill   allopurinol (ZYLOPRIM) 300 MG tablet TAKE 1 TABLET BY MOUTH EVERY DAY 90 tablet 0   amLODipine (NORVASC) 5 MG tablet TAKE 1 TABLET (5 MG TOTAL) BY MOUTH DAILY. 30 tablet 0   ampicillin (PRINCIPEN) 500 MG capsule Take 500 mg by mouth 2 (two) times daily as needed. (Patient not taking: Reported on 07/04/2023)     apixaban (ELIQUIS) 5 MG TABS tablet Take 1 tablet (5 mg total) by mouth 2 (two) times daily. 60 tablet 5   ferrous sulfate 325 (65 FE) MG tablet Take 325 mg by mouth daily.     finasteride (PROSCAR) 5 MG tablet Take 5 mg by mouth.     furosemide (LASIX) 40 MG tablet Take 0.5 tablets (20 mg total) by  mouth daily.     Melatonin ([employer] MELATONIN) 10 MG CAPS Take 10 mg by mouth at bedtime. 100 capsule 1   metoprolol succinate (TOPROL-XL) 50 MG 24 hr tablet TAKE 1 TABLET DAILY WITH OR IMMEDIATELY FOLLOWING A MEAL. PATIENT *NEED OFFICE VISIT [phone]. 90  tablet 0   ondansetron (ZOFRAN) 8 MG tablet Take 1 tablet (8 mg total) by mouth every 8 (eight) hours as needed for nausea or vomiting. 30 tablet 1   prochlorperazine (COMPAZINE) 10 MG tablet Take 1 tablet (10 mg total) by mouth every 6 (six) hours as needed for nausea or vomiting. 30 tablet 1   tamsulosin (FLOMAX) 0.4 MG CAPS capsule Take 1 capsule (0.4 mg total) by mouth daily. 30 capsule 0   valsartan (DIOVAN) 80 MG tablet Take 1 tablet (80 mg total) by mouth daily. 90 tablet 1   No current facility-administered medications for this visit.   Facility-Administered Medications Ordered in Other Visits  Medication Dose Route Frequency Provider Last Rate Last Admin   0.9 %  sodium chloride infusion   Intravenous Continuous Melven Sartorius, MD 10 mL/hr at 11/15/23 1415 New Bag at 11/15/23 1415   nivolumab (OPDIVO) 240 mg in sodium chloride 0.9 % 100 mL chemo infusion  240 mg Intravenous Once Melven Sartorius, MD 248 mL/hr at 11/15/23 1434 240 mg at 11/15/23 1434    HISTORY OF PRESENT ILLNESS:   Oncology History  Malignant melanoma (HCC)  01/01/2015 Initial Diagnosis   Malignant melanoma (HCC)   12/2014 Surgery   April 2016 WLE and SLN 01/31/2015: Reexcision    02/25/2018 Imaging   CT CAP Subcentimeter right upper lobe subpleural and left upper lobe perifissural nodules, new from 01/24/2016, indeterminate. Recommend short interval follow-up (3 months), given histor    04/07/2019 Imaging   CT Chest: No evidence of metastatic disease within the chest. Left upper lobe perifissural nodule no longer evident. Right upper lobe subpleural nodule no longer evident.   CT AP No evidence of metastatic disease.     04/2020 Pathology Results   Summer 2021: New scalp lesion, in transit  path: -Malignant melanoma, involving dermal tissue    04/12/2020 Imaging   CT chest: NED CT AP: NED    05/04/2020 Imaging   CT Neck: No mass or lymphadenopathy within the neck.    05/25/2020 - 07/25/2020 Chemotherapy    5 cycles of TVEC   01/24/2021 PET scan   PET whole body - No abnormal foci of increased radiotracer uptake to suggest metastatic disease.  -Small focus of increased uptake within the proximal sigmoid colon without adjacent stranding or definite wall thickening, indeterminant. Recommend further evaluation with colonoscopy if clinically    08/24/2021 PET scan   PET whole body - No abnormal foci of increased radiotracer uptake to suggest metastatic disease.   - Small focus of increased uptake within the proximal sigmoid colon, similar to 01/24/2021. Recommend clinical correlation and/or further evaluation with colonoscopy if clinically indicated    01/31/2022 Surgery   (Outside case, [phone], 1 slide, 01/23/2022) Skin, left inferior lateral anterior scalp, shave - Metastatic malignant melanoma Size: 2 mm Pattern: Pigmented epithelioid cells forming small plaque in superficial dermis and showing epidermotropism Tumor infiltrating lymphocytes: Brisk Nuclear grade: 3/3 Margins: Focally present adjacent to edge of tissue sections    02/21/2022 - 05/09/2022 Chemotherapy   had an intransit recurrence of melanoma and was retreated with TVEC.  Biopsy in October 2023 showed NED.    05/09/2022 PET scan   -  Mildly avid 3 mm right upper lobe subcentimeter pulmonary nodule, possibly increased from 2 mm on prior; recommend attention on follow-up.   -No definite evidence of metabolically active disease.    05/28/2022 Pathology Results   Diagnosis   Skin, Left temple, excision - Dermal fibrosis with lymphocytic and granulomatous inflammation consistent with site of recent therapy - No residual melanoma identified - Margins free of tumor      02/15/2023 PET scan   - Focal area of hypermetabolic activity is seen in the ascending colon, indeterminate. Recommend correlation with colonoscopy.   -Unchanged size of mildly hypermetabolic left paratracheal lymph node, indeterminate. Recommend attention on  follow-up imaging.   -Pancreatic calcifications likely reflect sequela of chronic pancreatitis.   -Bladder diverticuli and enlarged prostate.    05/13/2023 PET scan   1.  Evidence of progressive disease as demonstrated by interval enlargement of right upper lobe pulmonary nodule most concerning for metastasis by melanoma. Additional single right pulmonary nodule and 2 sites of focal osseous avidity which are suspicious but indeterminate for additional sites of involvement by metastasis involving the T8 and L1 vertebral bodies.  2.  Focal uptake within the sigmoid colon which has increased in conspicuity over time. Recommend correlation with colonoscopy if not recently completed with differential diagnosis including adenoma, carcinoma, and inflammatory lesion (given associated coarse calcifications/high density material). No evidence of complication (i.e.  fluid collection, or free air) by low-dose unenhanced CT.  3.  Persistent FDG abnormality of the left anterior shin associated with a dermal nodule. Recommend direct inspection as additional site of cutaneous neoplasm is not excluded.    06/14/2023 Pathology Results   A: Lung, right upper lobe, lobectomy  - Metastatic melanoma, multifocal (largest focus 2.4 cm, with two sub-centimeter nodules also identified microscopically, each 3 mm) - Surgical margins negative  - BRAF (VE1): Negative - TILs: brisk - Two lymph nodes, negative for malignancy (0/2)   07/17/2023 Genetic Testing   [place]: pertinent negatives: BRAF. KIT TERT C.-124C>T variant- promoter mutation NRAS p.G13D Missense variant (exon 2) - GOF TP53 p.R342P ARID1A p.G262fs NF1 p.R1362* PHLPP2 p.Q300* NF1 p.K191_R192delinsN*  Multiple VUS findings.   08/05/2023 Imaging   MRI brain  --Single enhancing right occipital cortical lesion is most concerning for intracranial metastatic disease.   --Indeterminate 7 mm enhancing lesion in the right posterior parietal bone. There is no  definite FDG avidity in this region on the recent PET/CT, arguing against malignancy, however close attention to this area on follow-up imaging is recommended.    08/05/2023 Imaging   CT chest IMPRESSION:   -Right upper lobectomy with moderate sized loculated right pleural effusion. New 0.3 cm right lower lobe micronodule. Recommend attention on follow-up CT in 3 months.   Few subcentimeter right infra-axillary lymph nodes with adjacent fat stranding and benign morphology, new since prior. Recommendation follow-up CT.   Markedly enlarged left atrium and mild qualitative dilatation of right atrium. Extensive coronary artery calcifications.    09/06/2023 -  Chemotherapy   Patient is on Treatment Plan : METASTATIC MELANOMA Nivolumab (240) q14d     09/19/2023 Cancer Staging   Staging form: Melanoma of the Skin, AJCC 7th Edition - Clinical: Stage IV (TX, N0, M1c) - Signed by Melven Sartorius, MD on 09/19/2023 Biopsy of metastatic site performed: Yes Source of metastatic specimen: Lung       REVIEW OF SYSTEMS:   All relevant systems were reviewed with the patient and are negative.   VITALS:  Blood pressure 132/68, pulse  63, temperature 97.6 F (36.4 C), temperature source Temporal, resp. rate 16, height 5' 10.5" (1.791 m), weight 178 lb 9.6 oz (81 kg), SpO2 98%.  Wt Readings from Last 3 Encounters:  11/15/23 178 lb 9.6 oz (81 kg)  10/31/23 179 lb 12.8 oz (81.6 kg)  10/04/23 176 lb 14.4 oz (80.2 kg)    Body mass index is 25.26 kg/m.  Performance status (ECOG): 1 - Symptomatic but completely ambulatory  PHYSICAL EXAM:   GENERAL: alert, no distress and comfortable SKIN: skin color normal, no rashes  EYES: normal, sclera clear OROPHARYNX: no exudate, no erythema    NECK: supple,  non-tender, without nodularity LYMPH:  no palpable cervical lymphadenopathy LUNGS: clear to auscultation with normal breathing effort.  No wheeze or rales HEART: regular rate & rhythm ABDOMEN: abdomen  soft, non-tender and nondistended Musculoskeletal: no edema NEURO: alert, fluent speech, no focal motor/sensory deficits.  Strength and sensation equal bilaterally.  LABORATORY DATA:  I have reviewed the data as listed    Component Value Date/Time   NA 133 (L) 11/15/2023 1308   NA 126 (A) 06/28/2023 0000   K 3.9 11/15/2023 1308   CL 98 11/15/2023 1308   CO2 28 11/15/2023 1308   GLUCOSE 153 (H) 11/15/2023 1308   BUN 16 11/15/2023 1308   BUN 11 06/28/2023 0000   CREATININE 0.95 11/15/2023 1308   CREATININE 1.49 (H) 06/06/2020 1022   CALCIUM 9.8 11/15/2023 1308   PROT 7.7 11/15/2023 1308   PROT 6.9 02/20/2021 0920   ALBUMIN 4.5 11/15/2023 1308   ALBUMIN 4.6 02/20/2021 0920   AST 24 11/15/2023 1308   ALT 22 11/15/2023 1308   ALKPHOS 88 11/15/2023 1308   BILITOT 1.3 (H) 11/15/2023 1308   GFRNONAA >60 11/15/2023 1308   GFRAA 53 05/10/2020 0000    No results found for: "SPEP", "UPEP"  Lab Results  Component Value Date   WBC 6.1 11/15/2023   NEUTROABS 4.5 11/15/2023   HGB 13.2 11/15/2023   HCT 38.3 (L) 11/15/2023   MCV 97.5 11/15/2023   PLT 220 11/15/2023      Chemistry      Component Value Date/Time   NA 133 (L) 11/15/2023 1308   NA 126 (A) 06/28/2023 0000   K 3.9 11/15/2023 1308   CL 98 11/15/2023 1308   CO2 28 11/15/2023 1308   BUN 16 11/15/2023 1308   BUN 11 06/28/2023 0000   CREATININE 0.95 11/15/2023 1308   CREATININE 1.49 (H) 06/06/2020 1022   GLU 103 06/28/2023 0000      Component Value Date/Time   CALCIUM 9.8 11/15/2023 1308   ALKPHOS 88 11/15/2023 1308   AST 24 11/15/2023 1308   ALT 22 11/15/2023 1308   BILITOT 1.3 (H) 11/15/2023 1308       RADIOGRAPHIC STUDIES: I have personally reviewed the radiological images as listed and agreed with the findings in the report. No results found.

## 2023-11-15 NOTE — Assessment & Plan Note (Signed)
 Continue nivolumab PET next week Follow up every cycle

## 2023-11-15 NOTE — Progress Notes (Signed)
 Nutrition Follow-up:  Pt with metastatic melanoma to brain, lung, bone, and liver. He is receiving nivolumab q14d (first 09/06/23)   Met with pt in infusion. He reports doing well overall. Appetite is not great, but making himself eat 3 times/day. He is also drinking one Ensure Complete. Patient denies nausea, vomiting, diarrhea, constipation.    Medications: reviewed   Labs: glucose 153, Na 133  Anthropometrics: Wt 178 lb 9.6 oz   2/27 - 179 lb 12.8 oz 1/31 - 176 lb 14.4 oz  1/3 - 178 lb 9.6 oz    NUTRITION DIAGNOSIS: Food and nutrition related knowledge deficit improved    INTERVENTION:  Continue Ensure Complete once daily for added calories and protein    MONITORING, EVALUATION, GOAL: wt trends, intake   NEXT VISIT: Thursday April 10 during infusion

## 2023-11-18 ENCOUNTER — Other Ambulatory Visit: Payer: Self-pay | Admitting: Internal Medicine

## 2023-11-19 ENCOUNTER — Ambulatory Visit
Admission: RE | Admit: 2023-11-19 | Discharge: 2023-11-19 | Disposition: A | Discharge status: Home / Self Care | Payer: Medicare HMO | Source: Ambulatory Visit | Attending: Radiation Oncology | Admitting: Radiation Oncology

## 2023-11-19 DIAGNOSIS — C7931 Secondary malignant neoplasm of brain: Secondary | ICD-10-CM | POA: Diagnosis not present

## 2023-11-19 DIAGNOSIS — Z85820 Personal history of malignant melanoma of skin: Secondary | ICD-10-CM | POA: Diagnosis not present

## 2023-11-19 MED ORDER — GADOPICLENOL 0.5 MMOL/ML IV SOLN
9.0000 mL | Freq: Once | INTRAVENOUS | Status: AC | PRN
  Administered 2023-11-19: 9 mL via INTRAVENOUS

## 2023-11-21 ENCOUNTER — Encounter (HOSPITAL_COMMUNITY)
Admission: RE | Admit: 2023-11-21 | Discharge: 2023-11-21 | Disposition: A | Discharge status: Home / Self Care | Payer: Medicare HMO | Source: Ambulatory Visit

## 2023-11-21 DIAGNOSIS — C439 Malignant melanoma of skin, unspecified: Secondary | ICD-10-CM | POA: Diagnosis not present

## 2023-11-21 DIAGNOSIS — C438 Malignant melanoma of overlapping sites of skin: Secondary | ICD-10-CM | POA: Diagnosis not present

## 2023-11-21 LAB — GLUCOSE, CAPILLARY: Glucose-Capillary: 118 mg/dL — ABNORMAL HIGH (ref 70–99)

## 2023-11-21 MED ORDER — FLUDEOXYGLUCOSE F - 18 (FDG) INJECTION
8.9000 | Freq: Once | INTRAVENOUS | Status: AC
  Administered 2023-11-21: 8.9 via INTRAVENOUS

## 2023-11-22 ENCOUNTER — Telehealth: Payer: Self-pay

## 2023-11-22 NOTE — Telephone Encounter (Signed)
 Left Message - Informed patient scheduled appts.

## 2023-11-25 ENCOUNTER — Inpatient Hospital Stay: Payer: Medicare HMO

## 2023-11-25 NOTE — Progress Notes (Signed)
 Telephone nursing appointment for review of most recent MRI-Brain results. I verified patient's identity x2 and began nursing interview.   Patient reports ***. Patient denies any other related issues at this time.   Meaningful use complete.   Patient aware of their telephone appointment w/ Ashlyn Bruning PA-C. I left my extension [phone] in case patient needs anything. Patient verbalized understanding. This concludes the nursing interview.   Patient preferred phone # [phone]     Ruel Favors, LPN

## 2023-11-26 ENCOUNTER — Telehealth: Payer: Self-pay | Admitting: *Deleted

## 2023-11-26 NOTE — Telephone Encounter (Signed)
 Called patient to ask if he would be willing to have PA Ashlyn Bruning call him @ 9 am on 11-28-23, spoke with patient and he agreed to do so

## 2023-11-27 ENCOUNTER — Ambulatory Visit: Payer: Medicare HMO | Admitting: Urology

## 2023-11-28 ENCOUNTER — Encounter: Payer: Self-pay | Admitting: Urology

## 2023-11-28 ENCOUNTER — Other Ambulatory Visit: Payer: Self-pay | Admitting: Radiation Therapy

## 2023-11-28 ENCOUNTER — Ambulatory Visit
Admission: RE | Admit: 2023-11-28 | Discharge: 2023-11-28 | Disposition: A | Discharge status: Home / Self Care | Source: Ambulatory Visit | Attending: Urology | Admitting: Urology

## 2023-11-28 DIAGNOSIS — C7931 Secondary malignant neoplasm of brain: Secondary | ICD-10-CM | POA: Diagnosis not present

## 2023-11-28 DIAGNOSIS — C434 Malignant melanoma of scalp and neck: Secondary | ICD-10-CM | POA: Diagnosis not present

## 2023-11-28 NOTE — Progress Notes (Signed)
 Radiation Oncology         [phone] ________________________________  Name: Nicholas Bird MRN: [id number]  Date: 11/28/2023  DOB: Feb 21, 1941  Post Treatment Note  CC: Ardith Dark, MD  Ardith Dark, MD  Diagnosis:   83 year old man with brain metastases secondary to stage IV malignant melanoma   Interval Since Last Radiation:  3 months  08/19/23:  The 4 targets in the brain were treated to 20 Gy in a single fraction of SRS.  Narrative:  I spoke with the patient to conduct his routine scheduled 3 month follow up visit to review the results of his recent post-treatment MRI brain scan via telephone to spare the patient unnecessary potential exposure in the healthcare setting during the current COVID-19 pandemic.  The patient was notified in advance and gave permission to proceed with this visit format.  He tolerated the treatment well and was discharged home in stable condition.  He has continued without complaints. His initial post-treatment MRI brain scan from 11/19/23 shows an excellent treatment response with resolution of the previously treated lesions and no evidence of new lesions. We reviewed the results today by telephone.  He also had recent restaging PET scan on 11/21/23 which also shows an excellent treatment response to his systemic immunotherapy with resolution of the hypermetabolic activity in the liver and bone lesions.  He remains on nivolumab immunotherapy, started 09/06/2023, under the care of Dr. Cherly Hensen and has a follow-up visit scheduled with Dr. Cherly Hensen on 11/29/2023.                          On review of systems, the patient states that he is doing very well in general and is without complaints.  He specifically denies headaches, dizziness, N/V, imbalance or difficulty with speech.  He has not noted any focal weakness and overall, remains pleased with his progress to date.  ALLERGIES:  is allergic to pertussis vaccines.  Meds: Current Outpatient Medications  Medication  Sig Dispense Refill   allopurinol (ZYLOPRIM) 300 MG tablet TAKE 1 TABLET BY MOUTH EVERY DAY 90 tablet 0   amLODipine (NORVASC) 5 MG tablet TAKE 1 TABLET (5 MG TOTAL) BY MOUTH DAILY. 30 tablet 0   ampicillin (PRINCIPEN) 500 MG capsule Take 500 mg by mouth 2 (two) times daily as needed.     apixaban (ELIQUIS) 5 MG TABS tablet Take 1 tablet (5 mg total) by mouth 2 (two) times daily. 60 tablet 5   ferrous sulfate 325 (65 FE) MG tablet Take 325 mg by mouth daily.     finasteride (PROSCAR) 5 MG tablet Take 5 mg by mouth.     furosemide (LASIX) 40 MG tablet Take 0.5 tablets (20 mg total) by mouth daily.     Melatonin ([employer] MELATONIN) 10 MG CAPS Take 10 mg by mouth at bedtime. 100 capsule 1   metoprolol succinate (TOPROL-XL) 50 MG 24 hr tablet TAKE 1 TABLET DAILY WITH OR IMMEDIATELY FOLLOWING A MEAL. PATIENT *NEED OFFICE VISIT [phone]. 90 tablet 0   ondansetron (ZOFRAN) 8 MG tablet Take 1 tablet (8 mg total) by mouth every 8 (eight) hours as needed for nausea or vomiting. 30 tablet 1   tamsulosin (FLOMAX) 0.4 MG CAPS capsule Take 1 capsule (0.4 mg total) by mouth daily. 30 capsule 0   valsartan (DIOVAN) 80 MG tablet Take 1 tablet (80 mg total) by mouth daily. 90 tablet 1   prochlorperazine (COMPAZINE) 10 MG tablet Take 1  tablet (10 mg total) by mouth every 6 (six) hours as needed for nausea or vomiting. (Patient not taking: Reported on 11/28/2023) 30 tablet 1   No current facility-administered medications for this encounter.    Physical Findings:  vitals were not taken for this visit.   /10 Unable to assess due to telephone follow-up visit format.  Lab Findings: Lab Results  Component Value Date   WBC 6.1 11/15/2023   HGB 13.2 11/15/2023   HCT 38.3 (L) 11/15/2023   MCV 97.5 11/15/2023   PLT 220 11/15/2023     Radiographic Findings: NM PET Image Initial (PI) Whole Body Result Date: 11/21/2023 CLINICAL DATA:  Subsequent treatment strategy for malignant melanoma, chemotherapy ongoing.  Brain radiation last year. EXAM: NUCLEAR MEDICINE PET WHOLE BODY TECHNIQUE: 8.9 mCi F-18 FDG was injected intravenously. Full-ring PET imaging was performed from the head to foot after the radiotracer. CT data was obtained and used for attenuation correction and anatomic localization. Fasting blood glucose: 118 mg/dl COMPARISON:  78/46/9629 FINDINGS: Mediastinal blood pool activity: SUV max 2.3 HEAD/NECK: No cervical nodal hypermetabolism. No intracranial hypermetabolism. Incidental CT findings: Cerebral atrophy. No cervical adenopathy. Bilateral carotid atherosclerosis. Left maxillary sinus mucous retention cyst or polyp. CHEST: A focus of hypermetabolism about the inferolateral right pleural space, along the lateral aspect of the right pleural fluid, measures a S.U.V. max of 3.4 today (106/4) versus a S.U.V. max of 2.7 on the prior exam. No CT correlate. No thoracic nodal hypermetabolism. Incidental CT findings: Similar small right pleural effusion with minimal loculation anteriorly. Mild cardiomegaly. Aortic and coronary artery calcification. Similar trace pericardial fluid. Right upper lobectomy. ABDOMEN/PELVIS: Previously described hepatic hypermetabolism has resolved. Suspect a 1.3 cm hypoattenuating lesion of 137/4, at the site of 1.6 cm hypermetabolic lesion previously. No abdominopelvic nodal hypermetabolism. Incidental CT findings: Pneumobilia. Cholecystectomy. Moderate caudate lobe enlargement. Irregular hepatic capsule. Normal adrenal glands. Chronic calcific pancreatitis. Pancreatic body and tail atrophy. Moderate bladder wall thickening with a large left-sided diverticulum. Mild prostatomegaly. Small fat containing right inguinal hernia. SKELETON: Resolution of previously described multifocal hypermetabolic osseous metastasis. Incidental CT findings: none EXTREMITIES: No areas of abnormal hypermetabolism. Incidental CT findings: none IMPRESSION: 1. Marked response to therapy, including resolution of  hepatic and osseous hypermetabolism. 2. Small right pleural effusion with a similar focus of nonspecific mild pleural hypermetabolism. Recommend attention on follow-up. 3. Incidental findings, including: Chronic calcific pancreatitis. Probable cirrhosis. Coronary artery atherosclerosis. Aortic Atherosclerosis (ICD10-I70.0). Bladder distension with dominant bladder diverticulum. Mild prostatomegaly. Sinus disease. Electronically Signed   By: Jeronimo Greaves M.D.   On: 11/21/2023 11:20   MR Brain W Wo Contrast Result Date: 11/19/2023 CLINICAL DATA:  Brain metastases, assess treatment response. History of melanoma and brain metastasis radiotherapy EXAM: MRI HEAD WITHOUT AND WITH CONTRAST TECHNIQUE: Multiplanar, multiecho pulse sequences of the brain and surrounding structures were obtained without and with intravenous contrast. CONTRAST:  9 cc vueway intravenous COMPARISON:  08/11/2023 FINDINGS: Brain: Previously seen lesions are no longer visible. No new lesion or swelling. No acute infarct, hemorrhage, hydrocephalus, or collection. Mild chronic white matter disease is stable. Vascular: Major flow voids and vascular enhancements are preserved. Prominent plaque in the left V4 segment. Skull and upper cervical spine: No enhancing marrow lesion or change. Scalp scarring at the vertex without nodule or osseous involvement. Sinuses/Orbits: Retention cyst in the left maxillary sinus. Other: Enhancing varix in the right cheek affecting the facial vein, stable from prior. IMPRESSION: Positive treatment response, previously seen brain lesions are no longer detectable. No  new lesion. Electronically Signed   By: Tiburcio Pea M.D.   On: 11/19/2023 12:31    Impression/Plan: 36. 83 year old man with brain metastases secondary to stage IV malignant melanoma. He tolerated the [facility] treatment very well and remains without complaints.  We reviewed the results of his recent posttreatment MRI brain scan which shows an excellent  treatment response with resolution of the treated lesions and no new lesions noted.  We discussed the plan to continue with surveillance MRI brain scans every 3 months to continue to monitor for any evidence of disease progression or recurrence.  I will plan to follow-up by telephone following each scan to review the results and recommendations.  He knows that he is welcome to call at anytime with any questions or concerns in the interim.  I personally spent 20 minutes in this encounter including chart review, reviewing radiological studies, telephone conversation with the patient, entering orders and completing documentation.    Marguarite Arbour, PA-C

## 2023-11-28 NOTE — Assessment & Plan Note (Addendum)
 Excellent response Hold nivolumab for diarrhea

## 2023-11-28 NOTE — Progress Notes (Unsigned)
 [facility] OFFICE PROGRESS NOTE  Patient Care Team: Ardith Dark, MD as PCP - General (Family Medicine) Duke Salvia, MD as PCP - Electrophysiology (Cardiology) Duke Salvia, MD (Cardiology) Marcine Matar, MD as Attending Physician (Urology) Donzetta Starch, MD as Consulting Physician (Dermatology) Antony Contras, MD as Consulting Physician (Ophthalmology) Hilarie Fredrickson, MD as Consulting Physician (Gastroenterology) Dahlia Byes, [facility] as Pharmacist (Pharmacist) Alejandro Mulling, RN as [facility] Care Management  Desi is a 83 y.o.male with history of atrial fibrillation on anticoagulation, hypertension, hyperlipidemia [facility]. Currently on active treatment with nivolumab.   Initially seen after right UL lobectomy with plan to start immunotherapy for stage 4 melanoma.  During restaging workup in December found to have solitary brain metastasis.  He has now completed SRS. Repeat imaging found bone and liver metastases, asymptomatic.    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 Initial diagnosis: 2016 melanoma of scalp Current treatment: Nivolumab every 2 weeks.  Interval response after 6 cycles of nivolumab.  Now developing diarrhea suspect immune related toxicity. Assessment & Plan Malignant melanoma of scalp (HCC) Excellent response Hold nivolumab for diarrhea Low serum vitamin B12 Mild anemia. B12 500 mcg daily. Diarrhea, unspecified type Concerning for low grade immune related toxicity. Will start prednisone 40 mg daily x 2 days, then 30 mg daily x 2 days, then 20 mg daily x 2 days, then 10 mg daily x 7 days, then stop  Famotidine 20 mg twice daily for 2 weeks. Prescriptions sent today to [facility]. See me in 1 week  Melven Sartorius, MD  INTERVAL HISTORY: he returns for treatment follow-up. Report diarrhea, loose, and watery about 10  days at about 3-4 a day. Mostly in the morning. No a/w particular food. He was taking miralax but stopped 3 weeks ago. No bloody. He had it this morning. He reports he had diarrhea all his life and lasted few days. No chill, fever, rigor, stomach pain, nausea, vomiting.  He had 3 watery diarrhea today.  He has been taking ampicillin for acne.  Oncology History  Malignant melanoma (HCC)  01/01/2015 Initial Diagnosis   Malignant melanoma (HCC)   12/2014 Surgery   April 2016 WLE and SLN 01/31/2015: Reexcision    02/25/2018 Imaging   CT CAP Subcentimeter right upper lobe subpleural and left upper lobe perifissural nodules, new from 01/24/2016, indeterminate. Recommend short interval follow-up (3 months), given histor    04/07/2019 Imaging   CT Chest: No evidence of metastatic disease within the chest. Left upper lobe perifissural nodule no longer evident. Right upper lobe subpleural nodule no longer evident.   CT AP No evidence of metastatic disease.     04/2020 Pathology Results   Summer 2021: New scalp lesion, in transit  path: -Malignant melanoma, involving dermal tissue    04/12/2020 Imaging   CT chest: NED CT AP: NED    05/04/2020 Imaging   CT Neck: No mass or lymphadenopathy within the neck.    05/25/2020 - 07/25/2020 Chemotherapy   5 cycles of TVEC   01/24/2021 PET scan   PET whole body - No abnormal foci of increased radiotracer uptake to suggest metastatic disease.  -Small focus of increased uptake within the proximal sigmoid colon without adjacent stranding or definite wall thickening, indeterminant. Recommend further evaluation with colonoscopy if clinically    08/24/2021 PET scan   PET whole body - No abnormal foci  of increased radiotracer uptake to suggest metastatic disease.   - Small focus of increased uptake within the proximal sigmoid colon, similar to 01/24/2021. Recommend clinical correlation and/or further evaluation with colonoscopy if clinically indicated     01/31/2022 Surgery   (Outside case, [phone], 1 slide, 01/23/2022) Skin, left inferior lateral anterior scalp, shave - Metastatic malignant melanoma Size: 2 mm Pattern: Pigmented epithelioid cells forming small plaque in superficial dermis and showing epidermotropism Tumor infiltrating lymphocytes: Brisk Nuclear grade: 3/3 Margins: Focally present adjacent to edge of tissue sections    02/21/2022 - 05/09/2022 Chemotherapy   had an intransit recurrence of melanoma and was retreated with TVEC.  Biopsy in October 2023 showed NED.    05/09/2022 PET scan   - Mildly avid 3 mm right upper lobe subcentimeter pulmonary nodule, possibly increased from 2 mm on prior; recommend attention on follow-up.   -No definite evidence of metabolically active disease.    05/28/2022 Pathology Results   Diagnosis   Skin, Left temple, excision - Dermal fibrosis with lymphocytic and granulomatous inflammation consistent with site of recent therapy - No residual melanoma identified - Margins free of tumor      02/15/2023 PET scan   - Focal area of hypermetabolic activity is seen in the ascending colon, indeterminate. Recommend correlation with colonoscopy.   -Unchanged size of mildly hypermetabolic left paratracheal lymph node, indeterminate. Recommend attention on follow-up imaging.   -Pancreatic calcifications likely reflect sequela of chronic pancreatitis.   -Bladder diverticuli and enlarged prostate.    05/13/2023 PET scan   1.  Evidence of progressive disease as demonstrated by interval enlargement of right upper lobe pulmonary nodule most concerning for metastasis by melanoma. Additional single right pulmonary nodule and 2 sites of focal osseous avidity which are suspicious but indeterminate for additional sites of involvement by metastasis involving the T8 and L1 vertebral bodies.  2.  Focal uptake within the sigmoid colon which has increased in conspicuity over time. Recommend correlation with  colonoscopy if not recently completed with differential diagnosis including adenoma, carcinoma, and inflammatory lesion (given associated coarse calcifications/high density material). No evidence of complication (i.e.  fluid collection, or free air) by low-dose unenhanced CT.  3.  Persistent FDG abnormality of the left anterior shin associated with a dermal nodule. Recommend direct inspection as additional site of cutaneous neoplasm is not excluded.    06/14/2023 Pathology Results   A: Lung, right upper lobe, lobectomy  - Metastatic melanoma, multifocal (largest focus 2.4 cm, with two sub-centimeter nodules also identified microscopically, each 3 mm) - Surgical margins negative  - BRAF (VE1): Negative - TILs: brisk - Two lymph nodes, negative for malignancy (0/2)   07/17/2023 Genetic Testing   [place]: pertinent negatives: BRAF. KIT TERT C.-124C>T variant- promoter mutation NRAS p.G13D Missense variant (exon 2) - GOF TP53 p.R342P ARID1A p.G260fs NF1 p.R1362* PHLPP2 p.Q300* NF1 p.K191_R192delinsN*  Multiple VUS findings.   08/05/2023 Imaging   MRI brain  --Single enhancing right occipital cortical lesion is most concerning for intracranial metastatic disease.   --Indeterminate 7 mm enhancing lesion in the right posterior parietal bone. There is no definite FDG avidity in this region on the recent PET/CT, arguing against malignancy, however close attention to this area on follow-up imaging is recommended.    08/05/2023 Imaging   CT chest IMPRESSION:   -Right upper lobectomy with moderate sized loculated right pleural effusion. New 0.3 cm right lower lobe micronodule. Recommend attention on follow-up CT in 3 months.   Few subcentimeter right infra-axillary  lymph nodes with adjacent fat stranding and benign morphology, new since prior. Recommendation follow-up CT.   Markedly enlarged left atrium and mild qualitative dilatation of right atrium. Extensive coronary artery calcifications.     09/06/2023 -  Chemotherapy   Patient is on Treatment Plan : METASTATIC MELANOMA Nivolumab (240) q14d     09/19/2023 Cancer Staging   Staging form: Melanoma of the Skin, AJCC 7th Edition - Clinical: Stage IV (TX, N0, M1c) - Signed by Melven Sartorius, MD on 09/19/2023 Biopsy of metastatic site performed: Yes Source of metastatic specimen: Lung   11/19/2023 Imaging   MRI brain: Positive treatment response, previously seen brain lesions are no longer detectable. No new lesion.   11/21/2023 PET scan   PET: after 6C of nivo every 2 weeks. Marked response to therapy, including resolution of hepatic and osseous hypermetabolism.      PHYSICAL EXAMINATION: ECOG PERFORMANCE STATUS: 1 - Symptomatic but completely ambulatory  Vitals:   11/29/23 0949  BP: 121/68  Pulse: 70  Resp: 16  Temp: 97.9 F (36.6 C)  SpO2: 100%   Filed Weights   11/29/23 0949  Weight: 180 lb 4.8 oz (81.8 kg)   ABD: soft, non-tender or distended  Relevant data reviewed during this visit included labs.

## 2023-11-29 ENCOUNTER — Other Ambulatory Visit: Payer: Self-pay

## 2023-11-29 ENCOUNTER — Inpatient Hospital Stay: Payer: Medicare HMO

## 2023-11-29 VITALS — BP 121/68 | HR 70 | Temp 97.9°F | Resp 16 | Wt 180.3 lb

## 2023-11-29 DIAGNOSIS — Z7901 Long term (current) use of anticoagulants: Secondary | ICD-10-CM | POA: Diagnosis not present

## 2023-11-29 DIAGNOSIS — I10 Essential (primary) hypertension: Secondary | ICD-10-CM | POA: Diagnosis not present

## 2023-11-29 DIAGNOSIS — C7951 Secondary malignant neoplasm of bone: Secondary | ICD-10-CM | POA: Diagnosis not present

## 2023-11-29 DIAGNOSIS — C7931 Secondary malignant neoplasm of brain: Secondary | ICD-10-CM | POA: Diagnosis not present

## 2023-11-29 DIAGNOSIS — R197 Diarrhea, unspecified: Secondary | ICD-10-CM

## 2023-11-29 DIAGNOSIS — Z79899 Other long term (current) drug therapy: Secondary | ICD-10-CM | POA: Diagnosis not present

## 2023-11-29 DIAGNOSIS — C787 Secondary malignant neoplasm of liver and intrahepatic bile duct: Secondary | ICD-10-CM | POA: Diagnosis not present

## 2023-11-29 DIAGNOSIS — E538 Deficiency of other specified B group vitamins: Secondary | ICD-10-CM

## 2023-11-29 DIAGNOSIS — D539 Nutritional anemia, unspecified: Secondary | ICD-10-CM

## 2023-11-29 DIAGNOSIS — I2510 Atherosclerotic heart disease of native coronary artery without angina pectoris: Secondary | ICD-10-CM | POA: Diagnosis not present

## 2023-11-29 DIAGNOSIS — C434 Malignant melanoma of scalp and neck: Secondary | ICD-10-CM

## 2023-11-29 DIAGNOSIS — E785 Hyperlipidemia, unspecified: Secondary | ICD-10-CM | POA: Diagnosis not present

## 2023-11-29 DIAGNOSIS — Z85820 Personal history of malignant melanoma of skin: Secondary | ICD-10-CM | POA: Diagnosis not present

## 2023-11-29 DIAGNOSIS — C438 Malignant melanoma of overlapping sites of skin: Secondary | ICD-10-CM

## 2023-11-29 DIAGNOSIS — I4891 Unspecified atrial fibrillation: Secondary | ICD-10-CM | POA: Diagnosis not present

## 2023-11-29 DIAGNOSIS — Z5112 Encounter for antineoplastic immunotherapy: Secondary | ICD-10-CM | POA: Diagnosis not present

## 2023-11-29 LAB — CMP (CANCER CENTER ONLY)
ALT: 18 U/L (ref 0–44)
AST: 25 U/L (ref 15–41)
Albumin: 4.3 g/dL (ref 3.5–5.0)
Alkaline Phosphatase: 73 U/L (ref 38–126)
Anion gap: 8 (ref 5–15)
BUN: 22 mg/dL (ref 8–23)
CO2: 27 mmol/L (ref 22–32)
Calcium: 9.7 mg/dL (ref 8.9–10.3)
Chloride: 99 mmol/L (ref 98–111)
Creatinine: 1.06 mg/dL (ref 0.61–1.24)
GFR, Estimated: >60 mL/min (ref >60)
Glucose, Bld: 153 mg/dL — ABNORMAL HIGH (ref 70–99)
Potassium: 3.9 mmol/L (ref 3.5–5.1)
Sodium: 134 mmol/L — ABNORMAL LOW (ref 135–145)
Total Bilirubin: 1.6 mg/dL — ABNORMAL HIGH (ref 0.0–1.2)
Total Protein: 7.4 g/dL (ref 6.5–8.1)

## 2023-11-29 LAB — CBC WITH DIFFERENTIAL (CANCER CENTER ONLY)
Abs Immature Granulocytes: 0.02 10*3/uL (ref 0.00–0.07)
Basophils Absolute: 0 10*3/uL (ref 0.0–0.1)
Basophils Relative: 1 %
Eosinophils Absolute: 0.1 10*3/uL (ref 0.0–0.5)
Eosinophils Relative: 2 %
HCT: 34.8 % — ABNORMAL LOW (ref 39.0–52.0)
Hemoglobin: 12.2 g/dL — ABNORMAL LOW (ref 13.0–17.0)
Immature Granulocytes: 0 %
Lymphocytes Relative: 18 %
Lymphs Abs: 1 10*3/uL (ref 0.7–4.0)
MCH: 33.5 pg (ref 26.0–34.0)
MCHC: 35.1 g/dL (ref 30.0–36.0)
MCV: 95.6 fL (ref 80.0–100.0)
Monocytes Absolute: 0.7 10*3/uL (ref 0.1–1.0)
Monocytes Relative: 13 %
Neutro Abs: 3.6 10*3/uL (ref 1.7–7.7)
Neutrophils Relative %: 66 %
Platelet Count: 220 10*3/uL (ref 150–400)
RBC: 3.64 MIL/uL — ABNORMAL LOW (ref 4.22–5.81)
RDW: 15 % (ref 11.5–15.5)
WBC Count: 5.4 10*3/uL (ref 4.0–10.5)
nRBC: 0 % (ref 0.0–0.2)

## 2023-11-29 LAB — LACTATE DEHYDROGENASE: LDH: 124 U/L (ref 98–192)

## 2023-11-29 LAB — TSH: TSH: 2.101 u[IU]/mL (ref 0.350–4.500)

## 2023-11-29 LAB — T4, FREE: Free T4: 1.04 ng/dL (ref 0.61–1.12)

## 2023-11-29 MED ORDER — FAMOTIDINE 20 MG PO TABS: 20.0000 mg | ORAL_TABLET | Freq: Two times a day (BID) | ORAL | 0 refills | Status: DC

## 2023-11-29 MED ORDER — PREDNISONE 10 MG PO TABS: ORAL_TABLET | ORAL | 0 refills | Status: DC

## 2023-11-29 NOTE — Assessment & Plan Note (Addendum)
 Mild anemia. B12 500 mcg daily.

## 2023-11-29 NOTE — Assessment & Plan Note (Addendum)
 Concerning for low grade immune related toxicity. Will start prednisone 40 mg daily x 2 days, then 30 mg daily x 2 days, then 20 mg daily x 2 days, then 10 mg daily x 7 days, then stop  Famotidine 20 mg twice daily for 2 weeks.

## 2023-12-02 ENCOUNTER — Telehealth: Payer: Self-pay

## 2023-12-02 ENCOUNTER — Other Ambulatory Visit: Payer: Self-pay | Admitting: Family Medicine

## 2023-12-02 NOTE — Telephone Encounter (Signed)
 Informed patient of upcoming appointments. Fleet is aware of all appointment details in regards to Dr. Nelta Numbers availability on 5/9.

## 2023-12-03 NOTE — Progress Notes (Unsigned)
 [facility] OFFICE PROGRESS NOTE  Patient Care Team: Ardith Dark, MD as PCP - General (Family Medicine) Duke Salvia, MD as PCP - Electrophysiology (Cardiology) Duke Salvia, MD (Cardiology) Marcine Matar, MD as Attending Physician (Urology) Donzetta Starch, MD as Consulting Physician (Dermatology) Antony Contras, MD as Consulting Physician (Ophthalmology) Hilarie Fredrickson, MD as Consulting Physician (Gastroenterology) Dahlia Byes, [facility] as Pharmacist (Pharmacist) Alejandro Mulling, RN as [facility] Care Management  Assessment & Plan   No orders of the defined types were placed in this encounter.    Melven Sartorius, MD  INTERVAL HISTORY: he returns for treatment follow-up Complications related to previous cycle of chemotherapy included {NG Chemo Complications (Optional):32385}  Oncology History  Malignant melanoma (HCC)  01/01/2015 Initial Diagnosis   Malignant melanoma (HCC)   12/2014 Surgery   April 2016 WLE and SLN 01/31/2015: Reexcision    02/25/2018 Imaging   CT CAP Subcentimeter right upper lobe subpleural and left upper lobe perifissural nodules, new from 01/24/2016, indeterminate. Recommend short interval follow-up (3 months), given histor    04/07/2019 Imaging   CT Chest: No evidence of metastatic disease within the chest. Left upper lobe perifissural nodule no longer evident. Right upper lobe subpleural nodule no longer evident.   CT AP No evidence of metastatic disease.     04/2020 Pathology Results   Summer 2021: New scalp lesion, in transit  path: -Malignant melanoma, involving dermal tissue    04/12/2020 Imaging   CT chest: NED CT AP: NED    05/04/2020 Imaging   CT Neck: No mass or lymphadenopathy within the neck.    05/25/2020 - 07/25/2020 Chemotherapy   5 cycles of TVEC   01/24/2021 PET scan   PET whole body - No abnormal foci of increased radiotracer uptake to suggest metastatic disease.  -Small focus of increased  uptake within the proximal sigmoid colon without adjacent stranding or definite wall thickening, indeterminant. Recommend further evaluation with colonoscopy if clinically    08/24/2021 PET scan   PET whole body - No abnormal foci of increased radiotracer uptake to suggest metastatic disease.   - Small focus of increased uptake within the proximal sigmoid colon, similar to 01/24/2021. Recommend clinical correlation and/or further evaluation with colonoscopy if clinically indicated    01/31/2022 Surgery   (Outside case, [phone], 1 slide, 01/23/2022) Skin, left inferior lateral anterior scalp, shave - Metastatic malignant melanoma Size: 2 mm Pattern: Pigmented epithelioid cells forming small plaque in superficial dermis and showing epidermotropism Tumor infiltrating lymphocytes: Brisk Nuclear grade: 3/3 Margins: Focally present adjacent to edge of tissue sections    02/21/2022 - 05/09/2022 Chemotherapy   had an intransit recurrence of melanoma and was retreated with TVEC.  Biopsy in October 2023 showed NED.    05/09/2022 PET scan   - Mildly avid 3 mm right upper lobe subcentimeter pulmonary nodule, possibly increased from 2 mm on prior; recommend attention on follow-up.   -No definite evidence of metabolically active disease.    05/28/2022 Pathology Results   Diagnosis   Skin, Left temple, excision - Dermal fibrosis with lymphocytic and granulomatous inflammation consistent with site of recent therapy - No residual melanoma identified - Margins free of tumor      02/15/2023 PET scan   - Focal area of hypermetabolic activity is seen in the ascending colon, indeterminate. Recommend correlation with colonoscopy.   -Unchanged size of mildly hypermetabolic left paratracheal lymph node, indeterminate. Recommend attention on follow-up imaging.   -Pancreatic calcifications  likely reflect sequela of chronic pancreatitis.   -Bladder diverticuli and enlarged prostate.    05/13/2023 PET scan    1.  Evidence of progressive disease as demonstrated by interval enlargement of right upper lobe pulmonary nodule most concerning for metastasis by melanoma. Additional single right pulmonary nodule and 2 sites of focal osseous avidity which are suspicious but indeterminate for additional sites of involvement by metastasis involving the T8 and L1 vertebral bodies.  2.  Focal uptake within the sigmoid colon which has increased in conspicuity over time. Recommend correlation with colonoscopy if not recently completed with differential diagnosis including adenoma, carcinoma, and inflammatory lesion (given associated coarse calcifications/high density material). No evidence of complication (i.e.  fluid collection, or free air) by low-dose unenhanced CT.  3.  Persistent FDG abnormality of the left anterior shin associated with a dermal nodule. Recommend direct inspection as additional site of cutaneous neoplasm is not excluded.    06/14/2023 Pathology Results   A: Lung, right upper lobe, lobectomy  - Metastatic melanoma, multifocal (largest focus 2.4 cm, with two sub-centimeter nodules also identified microscopically, each 3 mm) - Surgical margins negative  - BRAF (VE1): Negative - TILs: brisk - Two lymph nodes, negative for malignancy (0/2)   07/17/2023 Genetic Testing   [facility]: pertinent negatives: BRAF. KIT TERT C.-124C>T variant- promoter mutation NRAS p.G13D Missense variant (exon 2) - GOF TP53 p.R342P ARID1A p.G216fs NF1 p.R1362* PHLPP2 p.Q300* NF1 p.K191_R192delinsN*  Multiple VUS findings.   08/05/2023 Imaging   MRI brain  --Single enhancing right occipital cortical lesion is most concerning for intracranial metastatic disease.   --Indeterminate 7 mm enhancing lesion in the right posterior parietal bone. There is no definite FDG avidity in this region on the recent PET/CT, arguing against malignancy, however close attention to this area on follow-up imaging is recommended.    08/05/2023  Imaging   CT chest IMPRESSION:   -Right upper lobectomy with moderate sized loculated right pleural effusion. New 0.3 cm right lower lobe micronodule. Recommend attention on follow-up CT in 3 months.   Few subcentimeter right infra-axillary lymph nodes with adjacent fat stranding and benign morphology, new since prior. Recommendation follow-up CT.   Markedly enlarged left atrium and mild qualitative dilatation of right atrium. Extensive coronary artery calcifications.    09/06/2023 -  Chemotherapy   Patient is on Treatment Plan : METASTATIC MELANOMA Nivolumab (240) q14d     09/19/2023 Cancer Staging   Staging form: Melanoma of the Skin, AJCC 7th Edition - Clinical: Stage IV (TX, N0, M1c) - Signed by Melven Sartorius, MD on 09/19/2023 Biopsy of metastatic site performed: Yes Source of metastatic specimen: Lung   11/19/2023 Imaging   MRI brain: Positive treatment response, previously seen brain lesions are no longer detectable. No new lesion.   11/21/2023 PET scan   PET: after 6C of nivo every 2 weeks. Marked response to therapy, including resolution of hepatic and osseous hypermetabolism.      PHYSICAL EXAMINATION: ECOG PERFORMANCE STATUS: {CHL ONC ECOG PS:[phone]}  There were no vitals filed for this visit. There were no vitals filed for this visit.  Relevant data reviewed during this visit included ***

## 2023-12-03 NOTE — Assessment & Plan Note (Signed)
 Concerning for low grade immune related toxicity. Improved on prednisone.  Will wean off prednisone now at 20 mg daily x 2 days, then 10 mg daily x 7 days, then stop  Famotidine 20 mg twice daily for 2 weeks.

## 2023-12-03 NOTE — Assessment & Plan Note (Signed)
 Excellent response Hold nivolumab next week (4/10) for diarrhea and resume in 3 weeks (4/24) if continues to feel well without new symptoms.

## 2023-12-05 ENCOUNTER — Inpatient Hospital Stay

## 2023-12-05 ENCOUNTER — Other Ambulatory Visit: Payer: Self-pay

## 2023-12-05 DIAGNOSIS — C7931 Secondary malignant neoplasm of brain: Secondary | ICD-10-CM | POA: Diagnosis not present

## 2023-12-05 DIAGNOSIS — Z5112 Encounter for antineoplastic immunotherapy: Secondary | ICD-10-CM | POA: Diagnosis present

## 2023-12-05 DIAGNOSIS — D649 Anemia, unspecified: Secondary | ICD-10-CM | POA: Insufficient documentation

## 2023-12-05 DIAGNOSIS — R197 Diarrhea, unspecified: Secondary | ICD-10-CM | POA: Diagnosis not present

## 2023-12-05 DIAGNOSIS — Z79899 Other long term (current) drug therapy: Secondary | ICD-10-CM | POA: Insufficient documentation

## 2023-12-05 DIAGNOSIS — C7951 Secondary malignant neoplasm of bone: Secondary | ICD-10-CM | POA: Diagnosis not present

## 2023-12-05 DIAGNOSIS — C434 Malignant melanoma of scalp and neck: Secondary | ICD-10-CM | POA: Diagnosis present

## 2023-12-05 DIAGNOSIS — C439 Malignant melanoma of skin, unspecified: Secondary | ICD-10-CM

## 2023-12-06 ENCOUNTER — Other Ambulatory Visit: Payer: Self-pay

## 2023-12-09 ENCOUNTER — Ambulatory Visit (INDEPENDENT_AMBULATORY_CARE_PROVIDER_SITE_OTHER): Payer: Medicare HMO

## 2023-12-09 VITALS — BP 120/70 | HR 95 | Temp 98.5°F | Ht 70.5 in | Wt 177.0 lb

## 2023-12-09 DIAGNOSIS — Z0000 Encounter for general adult medical examination without abnormal findings: Secondary | ICD-10-CM

## 2023-12-09 NOTE — Patient Instructions (Signed)
 [name] , Thank you for taking time to come for your Medicare Wellness Visit. I appreciate your ongoing commitment to your health goals. Please review the following plan we discussed and let me know if I can assist you in the future.   Referrals/Orders/Follow-Ups/Clinician Recommendations: maintain health and activity   This is a list of the screening recommended for you and due dates:  Health Maintenance  Topic Date Due   Zoster (Shingles) Vaccine (1 of 2) Never done   Medicare Annual Wellness Visit  02/17/2023   COVID-19 Vaccine (5 - 2024-25 season) 05/05/2023   Flu Shot  04/03/2024   DTaP/Tdap/Td vaccine (3 - Td or Tdap) 09/14/2024   Colon Cancer Screening  04/10/2026   Pneumonia Vaccine  Completed   HPV Vaccine  Aged Out   Hepatitis C Screening  Discontinued    Advanced directives: (Copy Requested) Please bring a copy of your health care power of attorney and living will to the office to be added to your chart at your convenience. You can mail to [facility] [address] [place], [place] [address] or email to [email or web address]  Next Medicare Annual Wellness Visit scheduled for next year: Yes

## 2023-12-09 NOTE — Progress Notes (Signed)
 Subjective:   Nicholas Bird is a 83 y.o. who presents for a Medicare Wellness preventive visit.  Visit Complete: In person    Persons Participating in Visit: Patient.  AWV Questionnaire: Yes: Patient Medicare AWV questionnaire was completed by the patient on 12/05/23; I have confirmed that all information answered by patient is correct and no changes since this date.  Cardiac Risk Factors include: advanced age (>11men, >40 women);dyslipidemia;hypertension     Objective:    Today's Vitals   12/09/23 1306  BP: 120/70  Pulse: 95  Temp: 98.5 F (36.9 C)  SpO2: 99%  Weight: 177 lb (80.3 kg)  Height: 5' 10.5" (1.791 m)   Body mass index is 25.04 kg/m.     12/09/2023    1:10 PM 11/28/2023    8:26 AM 08/12/2023    9:11 AM 06/25/2023   10:17 AM 06/24/2023   10:25 AM 05/31/2022   10:59 AM 02/16/2022   10:38 AM  Advanced Directives  Does Patient Have a Medical Advance Directive? Yes Yes Yes No No Yes Yes  Type of Estate agent of [place];Living will Living will Living will;Healthcare Power of [employer] Power of [place];Living will Healthcare Power of Attorney  Does patient want to make changes to medical advance directive?  No - Patient declined       Copy of Healthcare Power of Attorney in Chart? No - copy requested      No - copy requested  Would patient like information on creating a medical advance directive?    No - Patient declined No - Patient declined      Current Medications (verified) Outpatient Encounter Medications as of 12/09/2023  Medication Sig   allopurinol (ZYLOPRIM) 300 MG tablet TAKE 1 TABLET BY MOUTH EVERY DAY   amLODipine (NORVASC) 5 MG tablet TAKE 1 TABLET (5 MG TOTAL) BY MOUTH DAILY.   ampicillin (PRINCIPEN) 500 MG capsule Take 500 mg by mouth 2 (two) times daily as needed.   apixaban (ELIQUIS) 5 MG TABS tablet Take 1 tablet (5 mg total) by mouth 2 (two) times daily.   famotidine (PEPCID) 20 MG tablet Take 1 tablet (20 mg  total) by mouth 2 (two) times daily.   ferrous sulfate 325 (65 FE) MG tablet Take 325 mg by mouth daily.   finasteride (PROSCAR) 5 MG tablet Take 5 mg by mouth.   furosemide (LASIX) 40 MG tablet Take 0.5 tablets (20 mg total) by mouth daily.   Melatonin ([employer] MELATONIN) 10 MG CAPS Take 10 mg by mouth at bedtime.   metoprolol succinate (TOPROL-XL) 50 MG 24 hr tablet TAKE 1 TABLET DAILY WITH OR IMMEDIATELY FOLLOWING A MEAL. PATIENT *NEED OFFICE VISIT [phone].   ondansetron (ZOFRAN) 8 MG tablet Take 1 tablet (8 mg total) by mouth every 8 (eight) hours as needed for nausea or vomiting.   predniSONE (DELTASONE) 10 MG tablet 40 mg daily x 2 days, then 30 mg daily x 2 days, then 20 mg daily x 2 days, then 10 mg daily x 7 days, then stop   tamsulosin (FLOMAX) 0.4 MG CAPS capsule Take 1 capsule (0.4 mg total) by mouth daily.   valsartan (DIOVAN) 80 MG tablet Take 1 tablet (80 mg total) by mouth daily.   prochlorperazine (COMPAZINE) 10 MG tablet Take 1 tablet (10 mg total) by mouth every 6 (six) hours as needed for nausea or vomiting. (Patient not taking: Reported on 12/09/2023)   No facility-administered encounter medications on file as of 12/09/2023.  Allergies (verified) Pertussis vaccines   History: Past Medical History:  Diagnosis Date   Anticoagulant long-term use    Atrial fibrillation, permanent (HCC)    CARIOLOGIST-  DR Graciela Husbands   Bladder diverticulum    BPH (benign prostatic hypertrophy)    ED (erectile dysfunction) of organic origin    Elevated LFTs    CHRONIC   Gall stones    Gout    per pt 12-09-2013  gout is stable   Hematuria    History of pancreatitis    10/2012--  resolved    History of TIA (transient ischemic attack)    04/2009--  no residual   Hyperlipemia    Hypertension    Liver disease    Melanoma (HCC)    Personal history of colonic polyps 07/12/2008   TUBULAR ADENOMA   Rosacea    Past Surgical History:  Procedure Laterality Date   CARDIAC CATHETERIZATION   09-19-1998   false positive stress test---  normal cath and preserved lvf   CATARACT EXTRACTION W/ INTRAOCULAR LENS  IMPLANT, BILATERAL Bilateral 2008   CYSTOSCOPY WITH BIOPSY N/A 12/14/2013   Procedure: CYSTOSCOPY WITH BIOPSY;  Surgeon: Marcine Matar, MD;  Location: [facility];  Service: Urology;  Laterality: N/A;   INGUINAL HERNIA REPAIR Left 01-14-2001   LAPAROSCOPIC CHOLECYSTECTOMY  10-21-2008   AND LIVER BX'S   LAPAROSCOPIC INGUINAL HERNIA REPAIR Bilateral 05-22-2009   Family History  Problem Relation Age of Onset   Lung cancer Mother    Skin cancer Sister    Colon cancer Neg Hx    Esophageal cancer Neg Hx    Pancreatic cancer Neg Hx    Stomach cancer Neg Hx    Rectal cancer Neg Hx    Social History   Socioeconomic History   Marital status: Married    Spouse name: Not on file   Number of children: 2   Years of education: Not on file   Highest education level: Bachelor's degree (e.g., BA, AB, BS)  Occupational History   Occupation: retired    Associate Professor: RETIRED  Tobacco Use   Smoking status: Never   Smokeless tobacco: Never  Vaping Use   Vaping status: Never Used  Substance and Sexual Activity   Alcohol use: Yes    Alcohol/week: 14.0 standard drinks of alcohol    Types: 14 Shots of liquor per week    Comment: 3 oz. per day   Drug use: No   Sexual activity: Yes    Partners: Female  Other Topics Concern   Not on file  Social History Narrative   Not on file   Social Drivers of Health   Financial Resource Strain: Low Risk  (12/05/2023)   Overall Financial Resource Strain (CARDIA)    Difficulty of Paying Living Expenses: Not hard at all  Food Insecurity: No Food Insecurity (12/05/2023)   Hunger Vital Sign    Worried About Running Out of Food in the Last Year: Never true    Ran Out of Food in the Last Year: Never true  Transportation Needs: No Transportation Needs (12/05/2023)   PRAPARE - Administrator, Civil Service (Medical): No     Lack of Transportation (Non-Medical): No  Physical Activity: Insufficiently Active (12/05/2023)   Exercise Vital Sign    Days of Exercise per Week: 5 days    Minutes of Exercise per Session: 20 min  Stress: Stress Concern Present (12/05/2023)   [employer] of Occupational Health - Occupational Stress Questionnaire  Feeling of Stress : To some extent  Social Connections: Socially Integrated (12/05/2023)   Social Connection and Isolation Panel [NHANES]    Frequency of Communication with Friends and Family: Three times a week    Frequency of Social Gatherings with Friends and Family: Twice a week    Attends Religious Services: More than 4 times per year    Active Member of [employer] or Organizations: Yes    Attends Engineer, structural: More than 4 times per year    Marital Status: Married    Tobacco Counseling Counseling given: Not Answered    Clinical Intake:  Pre-visit preparation completed: Yes  Pain : No/denies pain     BMI - recorded: 25.04 Nutritional Status: BMI 25 -29 Overweight  Lab Results  Component Value Date   HGBA1C 5.4 06/06/2020   HGBA1C 5.4 11/16/2016     How often do you need to have someone help you when you read instructions, pamphlets, or other written materials from your doctor or pharmacy?: 1 - Never  Interpreter Needed?: No  Information entered by :: Lanier Ensign, LPN   Activities of Daily Living     12/05/2023    8:44 AM  In your present state of health, do you have any difficulty performing the following activities:  Hearing? 0  Vision? 0  Difficulty concentrating or making decisions? 0  Walking or climbing stairs? 0  Dressing or bathing? 0  Doing errands, shopping? 0  Preparing Food and eating ? N  Using the Toilet? N  In the past six months, have you accidently leaked urine? N  Do you have problems with loss of bowel control? N  Managing your Medications? N  Managing your Finances? N  Housekeeping or managing your  Housekeeping? N    Patient Care Team: Ardith Dark, MD as PCP - General (Family Medicine) Duke Salvia, MD as PCP - Electrophysiology (Cardiology) Duke Salvia, MD (Cardiology) Marcine Matar, MD as Attending Physician (Urology) Donzetta Starch, MD as Consulting Physician (Dermatology) Antony Contras, MD as Consulting Physician (Ophthalmology) Hilarie Fredrickson, MD as Consulting Physician (Gastroenterology) Dahlia Byes, [facility] as Pharmacist (Pharmacist) Alejandro Mulling, RN as [facility] Care Management  Indicate any recent Medical Services you may have received from other than [facility] providers in the past year (date may be approximate).     Assessment:   This is a routine wellness examination for Nicholas Bird.  Hearing/Vision screen Hearing Screening - Comments:: Pt denies any hearing issues  Vision Screening - Comments:: Wears rx glasses - up to date with routine eye exams with Dr Randon Goldsmith     Goals Addressed             This Visit's Progress    Patient Stated       Maintain health and activity        Depression Screen     12/09/2023    1:10 PM 07/04/2023    9:56 AM 05/31/2022   10:59 AM 02/16/2022   10:37 AM 12/20/2021    9:42 AM 11/30/2021    1:13 PM 09/16/2020    2:36 PM  PHQ 2/9 Scores  PHQ - 2 Score 0 0 0 0 0 0 0    Fall Risk     12/05/2023    8:44 AM 07/04/2023    9:56 AM 02/16/2022   10:40 AM 12/20/2021    9:42 AM 11/30/2021    1:13 PM  Fall Risk   Falls in the past year?  0 0 0 0 0  Number falls in past yr:  0 0 0 0  Injury with Fall?  0 0 0 0  Risk for fall due to : Impaired balance/gait No Fall Risks Impaired vision  No Fall Risks  Follow up Falls prevention discussed  Falls prevention discussed  Falls evaluation completed    MEDICARE RISK AT HOME:  Medicare Risk at Home Any stairs in or around the home?: (Patient-Rptd) No If so, are there any without handrails?: (Patient-Rptd) No Home free of loose throw rugs in walkways, pet beds,  electrical cords, etc?: (Patient-Rptd) Yes Adequate lighting in your home to reduce risk of falls?: (Patient-Rptd) Yes Life alert?: (Patient-Rptd) Yes Use of a cane, walker or w/c?: (Patient-Rptd) No Grab bars in the bathroom?: (Patient-Rptd) Yes Shower chair or bench in shower?: (Patient-Rptd) No Elevated toilet seat or a handicapped toilet?: (Patient-Rptd) Yes  TIMED UP AND GO:  Was the test performed?  Yes  Length of time to ambulate 10 feet: 10 sec Gait steady and fast without use of assistive device  Cognitive Function: 6CIT completed        12/09/2023    1:12 PM 02/16/2022   10:41 AM 09/16/2020    2:44 PM  6CIT Screen  What Year? 0 points 0 points 0 points  What month? 0 points 0 points 0 points  What time? 0 points 0 points   Count back from 20 0 points 0 points 0 points  Months in reverse 0 points 0 points 0 points  Repeat phrase 0 points 0 points 0 points  Total Score 0 points 0 points     Immunizations Immunization History  Administered Date(s) Administered   Fluad Quad(high Dose 65+) 05/19/2019, 06/06/2020, 07/04/2022   Influenza Split 07/09/2012, 07/08/2013, 07/19/2016   Influenza Whole 08/09/2009   Influenza, High Dose Seasonal PF 06/04/2018, 06/06/2023   Influenza-Unspecified 07/05/2015, 05/29/2021   PFIZER(Purple Top)SARS-COV-2 Vaccination 09/23/2019, 10/14/2019, 06/05/2020, 05/18/2021   Pneumococcal Conjugate-13 09/14/2014   Pneumococcal Polysaccharide-23 07/09/2012   Td 09/03/2004   Tdap 09/14/2014    Screening Tests Health Maintenance  Topic Date Due   Zoster Vaccines- Shingrix (1 of 2) Never done   COVID-19 Vaccine (5 - 2024-25 season) 05/05/2023   INFLUENZA VACCINE  04/03/2024   DTaP/Tdap/Td (3 - Td or Tdap) 09/14/2024   Medicare Annual Wellness (AWV)  12/08/2024   Colonoscopy  04/10/2026   Pneumonia Vaccine 24+ Years old  Completed   HPV VACCINES  Aged Out   Hepatitis C Screening  Discontinued    Health Maintenance  Health Maintenance  Due  Topic Date Due   Zoster Vaccines- Shingrix (1 of 2) Never done   COVID-19 Vaccine (5 - 2024-25 season) 05/05/2023   Health Maintenance Items Addressed: See Nurse Notes  Additional Screening:  Vision Screening: Recommended annual ophthalmology exams for early detection of glaucoma and other disorders of the eye.  Dental Screening: Recommended annual dental exams for proper oral hygiene  Community Resource Referral / Chronic Care Management: CRR required this visit?  No   CCM required this visit?  No     Plan:     I have personally reviewed and noted the following in the patient's chart:   Medical and social history Use of alcohol, tobacco or illicit drugs  Current medications and supplements including opioid prescriptions. Patient is not currently taking opioid prescriptions. Functional ability and status Nutritional status Physical activity Advanced directives List of other physicians Hospitalizations, surgeries, and ER visits in previous 12 months  Vitals Screenings to include cognitive, depression, and falls Referrals and appointments  In addition, I have reviewed and discussed with patient certain preventive protocols, quality metrics, and best practice recommendations. A written personalized care plan for preventive services as well as general preventive health recommendations were provided to patient.     Marzella Schlein, LPN   12/08/4257   After Visit Summary: (MyChart) Due to this being a telephonic visit, the after visit summary with patients personalized plan was offered to patient via MyChart   Notes: Nothing significant to report at this time.

## 2023-12-10 ENCOUNTER — Other Ambulatory Visit: Payer: Self-pay

## 2023-12-10 NOTE — Assessment & Plan Note (Signed)
 Excellent response Hold nivolumab next week (4/10) for diarrhea and resume in 3 weeks (4/24) if continues to feel well without new symptoms.

## 2023-12-10 NOTE — Assessment & Plan Note (Addendum)
 Monitor with CBC, CMP each visit and H&P for immune related toxicity.

## 2023-12-10 NOTE — Assessment & Plan Note (Addendum)
 Concerning for low grade immune related toxicity. Resolved on prednisone.  Completed prednisone at 10 mg daily  Report if recurs Famotidine 20 mg twice daily for 2 weeks.

## 2023-12-10 NOTE — Assessment & Plan Note (Signed)
 Completed SRS to 4 lesions on 08/20/23 Continue nivolumab today Interval response on MRI on 3/18

## 2023-12-10 NOTE — Progress Notes (Unsigned)
 [facility] OFFICE PROGRESS NOTE  Patient Care Team: Ardith Dark, MD as PCP - General (Family Medicine) Duke Salvia, MD as PCP - Electrophysiology (Cardiology) Duke Salvia, MD (Cardiology) Marcine Matar, MD as Attending Physician (Urology) Donzetta Starch, MD as Consulting Physician (Dermatology) Antony Contras, MD as Consulting Physician (Ophthalmology) Hilarie Fredrickson, MD as Consulting Physician (Gastroenterology) Dahlia Byes, [facility] as Pharmacist (Pharmacist) Alejandro Mulling, RN as [facility] Care Management  83 y.o.man undergoing nivolumab for stage IV melanoma being follow up for immune related side effects with diarrhea.   Clinically improved. Assessment & Plan Diarrhea, unspecified type Concerning for low grade immune related toxicity. Improved on prednisone.  Completed prednisone at 10 mg daily   Famotidine 20 mg twice daily for 2 weeks. At risk for side effect of medication Monitor with CBC, CMP each visit and H&P for immune related toxicity.   No orders of the defined types were placed in this encounter.    Nicholas Sartorius, MD  INTERVAL HISTORY: he returns for treatment follow-up Complications related to previous cycle of chemotherapy included {NG Chemo Complications (Optional):32385}  Oncology History  Malignant melanoma (HCC)  01/01/2015 Initial Diagnosis   Malignant melanoma (HCC)   12/2014 Surgery   April 2016 WLE and SLN 01/31/2015: Reexcision    02/25/2018 Imaging   CT CAP Subcentimeter right upper lobe subpleural and left upper lobe perifissural nodules, new from 01/24/2016, indeterminate. Recommend short interval follow-up (3 months), given histor    04/07/2019 Imaging   CT Chest: No evidence of metastatic disease within the chest. Left upper lobe perifissural nodule no longer evident. Right upper lobe subpleural nodule no longer evident.   CT AP No evidence of metastatic disease.     04/2020 Pathology Results    Summer 2021: New scalp lesion, in transit  path: -Malignant melanoma, involving dermal tissue    04/12/2020 Imaging   CT chest: NED CT AP: NED    05/04/2020 Imaging   CT Neck: No mass or lymphadenopathy within the neck.    05/25/2020 - 07/25/2020 Chemotherapy   5 cycles of TVEC   01/24/2021 PET scan   PET whole body - No abnormal foci of increased radiotracer uptake to suggest metastatic disease.  -Small focus of increased uptake within the proximal sigmoid colon without adjacent stranding or definite wall thickening, indeterminant. Recommend further evaluation with colonoscopy if clinically    08/24/2021 PET scan   PET whole body - No abnormal foci of increased radiotracer uptake to suggest metastatic disease.   - Small focus of increased uptake within the proximal sigmoid colon, similar to 01/24/2021. Recommend clinical correlation and/or further evaluation with colonoscopy if clinically indicated    01/31/2022 Surgery   (Outside case, [id number], 1 slide, 01/23/2022) Skin, left inferior lateral anterior scalp, shave - Metastatic malignant melanoma Size: 2 mm Pattern: Pigmented epithelioid cells forming small plaque in superficial dermis and showing epidermotropism Tumor infiltrating lymphocytes: Brisk Nuclear grade: 3/3 Margins: Focally present adjacent to edge of tissue sections    02/21/2022 - 05/09/2022 Chemotherapy   had an intransit recurrence of melanoma and was retreated with TVEC.  Biopsy in October 2023 showed NED.    05/09/2022 PET scan   - Mildly avid 3 mm right upper lobe subcentimeter pulmonary nodule, possibly increased from 2 mm on prior; recommend attention on follow-up.   -No definite evidence of metabolically active disease.    05/28/2022 Pathology Results   Diagnosis   Skin, Left temple, excision -  Dermal fibrosis with lymphocytic and granulomatous inflammation consistent with site of recent therapy - No residual melanoma identified - Margins free of tumor       02/15/2023 PET scan   - Focal area of hypermetabolic activity is seen in the ascending colon, indeterminate. Recommend correlation with colonoscopy.   -Unchanged size of mildly hypermetabolic left paratracheal lymph node, indeterminate. Recommend attention on follow-up imaging.   -Pancreatic calcifications likely reflect sequela of chronic pancreatitis.   -Bladder diverticuli and enlarged prostate.    05/13/2023 PET scan   1.  Evidence of progressive disease as demonstrated by interval enlargement of right upper lobe pulmonary nodule most concerning for metastasis by melanoma. Additional single right pulmonary nodule and 2 sites of focal osseous avidity which are suspicious but indeterminate for additional sites of involvement by metastasis involving the T8 and L1 vertebral bodies.  2.  Focal uptake within the sigmoid colon which has increased in conspicuity over time. Recommend correlation with colonoscopy if not recently completed with differential diagnosis including adenoma, carcinoma, and inflammatory lesion (given associated coarse calcifications/high density material). No evidence of complication (i.e.  fluid collection, or free air) by low-dose unenhanced CT.  3.  Persistent FDG abnormality of the left anterior shin associated with a dermal nodule. Recommend direct inspection as additional site of cutaneous neoplasm is not excluded.    06/14/2023 Pathology Results   A: Lung, right upper lobe, lobectomy  - Metastatic melanoma, multifocal (largest focus 2.4 cm, with two sub-centimeter nodules also identified microscopically, each 3 mm) - Surgical margins negative  - BRAF (VE1): Negative - TILs: brisk - Two lymph nodes, negative for malignancy (0/2)   07/17/2023 Genetic Testing   [place]: pertinent negatives: BRAF. KIT TERT C.-124C>T variant- promoter mutation NRAS p.G13D Missense variant (exon 2) - GOF TP53 p.R342P ARID1A p.G214fs NF1 p.R1362* PHLPP2 p.Q300* NF1  p.K191_R192delinsN*  Multiple VUS findings.   08/05/2023 Imaging   MRI brain  --Single enhancing right occipital cortical lesion is most concerning for intracranial metastatic disease.   --Indeterminate 7 mm enhancing lesion in the right posterior parietal bone. There is no definite FDG avidity in this region on the recent PET/CT, arguing against malignancy, however close attention to this area on follow-up imaging is recommended.    08/05/2023 Imaging   CT chest IMPRESSION:   -Right upper lobectomy with moderate sized loculated right pleural effusion. New 0.3 cm right lower lobe micronodule. Recommend attention on follow-up CT in 3 months.   Few subcentimeter right infra-axillary lymph nodes with adjacent fat stranding and benign morphology, new since prior. Recommendation follow-up CT.   Markedly enlarged left atrium and mild qualitative dilatation of right atrium. Extensive coronary artery calcifications.    09/06/2023 -  Chemotherapy   Patient is on Treatment Plan : METASTATIC MELANOMA Nivolumab (240) q14d     09/19/2023 Cancer Staging   Staging form: Melanoma of the Skin, AJCC 7th Edition - Clinical: Stage IV (TX, N0, M1c) - Signed by Nicholas Sartorius, MD on 09/19/2023 Biopsy of metastatic site performed: Yes Source of metastatic specimen: Lung   11/19/2023 Imaging   MRI brain: Positive treatment response, previously seen brain lesions are no longer detectable. No new lesion.   11/21/2023 PET scan   PET: after 6C of nivo every 2 weeks. Marked response to therapy, including resolution of hepatic and osseous hypermetabolism.      PHYSICAL EXAMINATION: ECOG PERFORMANCE STATUS: {CHL ONC ECOG [phone]}  There were no vitals filed for this visit. There were no vitals filed  for this visit.  Relevant data reviewed during this visit included ***

## 2023-12-12 ENCOUNTER — Inpatient Hospital Stay: Payer: Medicare HMO

## 2023-12-12 ENCOUNTER — Other Ambulatory Visit: Payer: Self-pay | Admitting: *Deleted

## 2023-12-12 ENCOUNTER — Inpatient Hospital Stay: Admitting: Dietician

## 2023-12-12 ENCOUNTER — Ambulatory Visit: Payer: Medicare HMO

## 2023-12-12 VITALS — BP 134/73 | HR 120 | Temp 96.8°F | Resp 16 | Wt 177.2 lb

## 2023-12-12 DIAGNOSIS — C439 Malignant melanoma of skin, unspecified: Secondary | ICD-10-CM

## 2023-12-12 DIAGNOSIS — Z9189 Other specified personal risk factors, not elsewhere classified: Secondary | ICD-10-CM | POA: Diagnosis not present

## 2023-12-12 DIAGNOSIS — R197 Diarrhea, unspecified: Secondary | ICD-10-CM

## 2023-12-12 DIAGNOSIS — C7931 Secondary malignant neoplasm of brain: Secondary | ICD-10-CM

## 2023-12-12 DIAGNOSIS — C438 Malignant melanoma of overlapping sites of skin: Secondary | ICD-10-CM | POA: Diagnosis not present

## 2023-12-12 DIAGNOSIS — C434 Malignant melanoma of scalp and neck: Secondary | ICD-10-CM

## 2023-12-12 DIAGNOSIS — Z5112 Encounter for antineoplastic immunotherapy: Secondary | ICD-10-CM | POA: Diagnosis not present

## 2023-12-12 LAB — CBC WITH DIFFERENTIAL (CANCER CENTER ONLY)
Abs Immature Granulocytes: 0.07 10*3/uL (ref 0.00–0.07)
Basophils Absolute: 0.1 10*3/uL (ref 0.0–0.1)
Basophils Relative: 1 %
Eosinophils Absolute: 0.1 10*3/uL (ref 0.0–0.5)
Eosinophils Relative: 1 %
HCT: 36.4 % — ABNORMAL LOW (ref 39.0–52.0)
Hemoglobin: 13 g/dL (ref 13.0–17.0)
Immature Granulocytes: 1 %
Lymphocytes Relative: 10 %
Lymphs Abs: 1.1 10*3/uL (ref 0.7–4.0)
MCH: 34.1 pg — ABNORMAL HIGH (ref 26.0–34.0)
MCHC: 35.7 g/dL (ref 30.0–36.0)
MCV: 95.5 fL (ref 80.0–100.0)
Monocytes Absolute: 1.2 10*3/uL — ABNORMAL HIGH (ref 0.1–1.0)
Monocytes Relative: 12 %
Neutro Abs: 7.7 10*3/uL (ref 1.7–7.7)
Neutrophils Relative %: 75 %
Platelet Count: 193 10*3/uL (ref 150–400)
RBC: 3.81 MIL/uL — ABNORMAL LOW (ref 4.22–5.81)
RDW: 15.4 % (ref 11.5–15.5)
WBC Count: 10.2 10*3/uL (ref 4.0–10.5)
nRBC: 0 % (ref 0.0–0.2)

## 2023-12-12 LAB — CEA (ACCESS): CEA (CHCC): 3.2 ng/mL (ref 0.00–5.00)

## 2023-12-12 LAB — TSH: TSH: 1.559 u[IU]/mL (ref 0.350–4.500)

## 2023-12-12 LAB — T4, FREE: Free T4: 1.36 ng/dL — ABNORMAL HIGH (ref 0.61–1.12)

## 2023-12-12 LAB — LACTATE DEHYDROGENASE: LDH: 138 U/L (ref 98–192)

## 2023-12-12 NOTE — Assessment & Plan Note (Signed)
 Excellent response Hold nivolumab next week (4/10) for diarrhea and resume in 3 weeks (4/24) if continues to feel well without new symptoms.

## 2023-12-24 ENCOUNTER — Telehealth: Payer: Self-pay

## 2023-12-24 NOTE — Telephone Encounter (Signed)
 Nicholas Bird called to confirm his appointment details. Nicholas Bird is aware that the message he received was not for today's appointments but for Jan 23, 2024.

## 2023-12-25 NOTE — Progress Notes (Unsigned)
 [facility] OFFICE PROGRESS NOTE  Patient Care Team: Rodney Clamp, MD as PCP - General (Family Medicine) Verona Goodwill, MD as PCP - Electrophysiology (Cardiology) Verona Goodwill, MD (Cardiology) Trent Frizzle, MD as Attending Physician (Urology) Harlen Lick, MD as Consulting Physician (Dermatology) Alvina Axon, MD as Consulting Physician (Ophthalmology) Tobin Forts, MD as Consulting Physician (Gastroenterology) Rolando Cliche, [facility] as Pharmacist (Pharmacist) Karren Paddock, RN as [facility] Care Management  Assessment & Plan   No orders of the defined types were placed in this encounter.    Lowanda Ruddy, MD  INTERVAL HISTORY: Patient returns for follow-up.  Oncology History  Malignant melanoma (HCC)  01/01/2015 Initial Diagnosis   Malignant melanoma (HCC)   12/2014 Surgery   April 2016 WLE and SLN 01/31/2015: Reexcision    02/25/2018 Imaging   CT CAP Subcentimeter right upper lobe subpleural and left upper lobe perifissural nodules, new from 01/24/2016, indeterminate. Recommend short interval follow-up (3 months), given histor    04/07/2019 Imaging   CT Chest: No evidence of metastatic disease within the chest. Left upper lobe perifissural nodule no longer evident. Right upper lobe subpleural nodule no longer evident.   CT AP No evidence of metastatic disease.     04/2020 Pathology Results   Summer 2021: New scalp lesion, in transit  path: -Malignant melanoma, involving dermal tissue    04/12/2020 Imaging   CT chest: NED CT AP: NED    05/04/2020 Imaging   CT Neck: No mass or lymphadenopathy within the neck.    05/25/2020 - 07/25/2020 Chemotherapy   5 cycles of TVEC   01/24/2021 PET scan   PET whole body - No abnormal foci of increased radiotracer uptake to suggest metastatic disease.  -Small focus of increased uptake within the proximal sigmoid colon without adjacent stranding or definite wall thickening, indeterminant.  Recommend further evaluation with colonoscopy if clinically    08/24/2021 PET scan   PET whole body - No abnormal foci of increased radiotracer uptake to suggest metastatic disease.   - Small focus of increased uptake within the proximal sigmoid colon, similar to 01/24/2021. Recommend clinical correlation and/or further evaluation with colonoscopy if clinically indicated    01/31/2022 Surgery   (Outside case, [phone], 1 slide, 01/23/2022) Skin, left inferior lateral anterior scalp, shave - Metastatic malignant melanoma Size: 2 mm Pattern: Pigmented epithelioid cells forming small plaque in superficial dermis and showing epidermotropism Tumor infiltrating lymphocytes: Brisk Nuclear grade: 3/3 Margins: Focally present adjacent to edge of tissue sections    02/21/2022 - 05/09/2022 Chemotherapy   had an intransit recurrence of melanoma and was retreated with TVEC.  Biopsy in October 2023 showed NED.    05/09/2022 PET scan   - Mildly avid 3 mm right upper lobe subcentimeter pulmonary nodule, possibly increased from 2 mm on prior; recommend attention on follow-up.   -No definite evidence of metabolically active disease.    05/28/2022 Pathology Results   Diagnosis   Skin, Left temple, excision - Dermal fibrosis with lymphocytic and granulomatous inflammation consistent with site of recent therapy - No residual melanoma identified - Margins free of tumor      02/15/2023 PET scan   - Focal area of hypermetabolic activity is seen in the ascending colon, indeterminate. Recommend correlation with colonoscopy.   -Unchanged size of mildly hypermetabolic left paratracheal lymph node, indeterminate. Recommend attention on follow-up imaging.   -Pancreatic calcifications likely reflect sequela of chronic pancreatitis.   -Bladder diverticuli and enlarged prostate.  05/13/2023 PET scan   1.  Evidence of progressive disease as demonstrated by interval enlargement of right upper lobe pulmonary  nodule most concerning for metastasis by melanoma. Additional single right pulmonary nodule and 2 sites of focal osseous avidity which are suspicious but indeterminate for additional sites of involvement by metastasis involving the T8 and L1 vertebral bodies.  2.  Focal uptake within the sigmoid colon which has increased in conspicuity over time. Recommend correlation with colonoscopy if not recently completed with differential diagnosis including adenoma, carcinoma, and inflammatory lesion (given associated coarse calcifications/high density material). No evidence of complication (i.e.  fluid collection, or free air) by low-dose unenhanced CT.  3.  Persistent FDG abnormality of the left anterior shin associated with a dermal nodule. Recommend direct inspection as additional site of cutaneous neoplasm is not excluded.    06/14/2023 Pathology Results   A: Lung, right upper lobe, lobectomy  - Metastatic melanoma, multifocal (largest focus 2.4 cm, with two sub-centimeter nodules also identified microscopically, each 3 mm) - Surgical margins negative  - BRAF (VE1): Negative - TILs: brisk - Two lymph nodes, negative for malignancy (0/2)   07/17/2023 Genetic Testing   [place]us: pertinent negatives: BRAF. KIT TERT C.-124C>T variant- promoter mutation NRAS p.G13D Missense variant (exon 2) - GOF TP53 p.R342P ARID1A p.G274fs NF1 p.R1362* PHLPP2 p.Q300* NF1 p.K191_R192delinsN*  Multiple VUS findings.   08/05/2023 Imaging   MRI brain  --Single enhancing right occipital cortical lesion is most concerning for intracranial metastatic disease.   --Indeterminate 7 mm enhancing lesion in the right posterior parietal bone. There is no definite FDG avidity in this region on the recent PET/CT, arguing against malignancy, however close attention to this area on follow-up imaging is recommended.    08/05/2023 Imaging   CT chest IMPRESSION:   -Right upper lobectomy with moderate sized loculated right pleural  effusion. New 0.3 cm right lower lobe micronodule. Recommend attention on follow-up CT in 3 months.   Few subcentimeter right infra-axillary lymph nodes with adjacent fat stranding and benign morphology, new since prior. Recommendation follow-up CT.   Markedly enlarged left atrium and mild qualitative dilatation of right atrium. Extensive coronary artery calcifications.    09/06/2023 -  Chemotherapy   Patient is on Treatment Plan : METASTATIC MELANOMA Nivolumab  (240) q14d     09/19/2023 Cancer Staging   Staging form: Melanoma of the Skin, AJCC 7th Edition - Clinical: Stage IV (TX, N0, M1c) - Signed by Lowanda Ruddy, MD on 09/19/2023 Biopsy of metastatic site performed: Yes Source of metastatic specimen: Lung   11/19/2023 Imaging   MRI brain: Positive treatment response, previously seen brain lesions are no longer detectable. No new lesion.   11/21/2023 PET scan   PET: after 6C of nivo every 2 weeks. Marked response to therapy, including resolution of hepatic and osseous hypermetabolism.      PHYSICAL EXAMINATION: ECOG PERFORMANCE STATUS: {CHL ONC ECOG [phone]}  There were no vitals filed for this visit. There were no vitals filed for this visit.  Relevant data reviewed during this visit included ***

## 2023-12-26 ENCOUNTER — Inpatient Hospital Stay: Payer: Medicare HMO

## 2023-12-26 VITALS — BP 117/65 | HR 77 | Temp 97.9°F | Resp 18 | Ht 70.5 in | Wt 178.6 lb

## 2023-12-26 DIAGNOSIS — C7931 Secondary malignant neoplasm of brain: Secondary | ICD-10-CM | POA: Diagnosis not present

## 2023-12-26 DIAGNOSIS — Z5112 Encounter for antineoplastic immunotherapy: Secondary | ICD-10-CM | POA: Diagnosis not present

## 2023-12-26 DIAGNOSIS — C434 Malignant melanoma of scalp and neck: Secondary | ICD-10-CM

## 2023-12-26 DIAGNOSIS — M899 Disorder of bone, unspecified: Secondary | ICD-10-CM

## 2023-12-26 DIAGNOSIS — E538 Deficiency of other specified B group vitamins: Secondary | ICD-10-CM | POA: Diagnosis not present

## 2023-12-26 DIAGNOSIS — C438 Malignant melanoma of overlapping sites of skin: Secondary | ICD-10-CM

## 2023-12-26 DIAGNOSIS — Z9189 Other specified personal risk factors, not elsewhere classified: Secondary | ICD-10-CM

## 2023-12-26 DIAGNOSIS — R197 Diarrhea, unspecified: Secondary | ICD-10-CM

## 2023-12-26 LAB — CMP (CANCER CENTER ONLY)
ALT: 12 U/L (ref 0–44)
AST: 17 U/L (ref 15–41)
Albumin: 4.2 g/dL (ref 3.5–5.0)
Alkaline Phosphatase: 79 U/L (ref 38–126)
Anion gap: 8 (ref 5–15)
BUN: 29 mg/dL — ABNORMAL HIGH (ref 8–23)
CO2: 26 mmol/L (ref 22–32)
Calcium: 9.3 mg/dL (ref 8.9–10.3)
Chloride: 98 mmol/L (ref 98–111)
Creatinine: 1.36 mg/dL — ABNORMAL HIGH (ref 0.61–1.24)
GFR, Estimated: 52 mL/min — ABNORMAL LOW (ref >60)
Glucose, Bld: 200 mg/dL — ABNORMAL HIGH (ref 70–99)
Potassium: 4.2 mmol/L (ref 3.5–5.1)
Sodium: 132 mmol/L — ABNORMAL LOW (ref 135–145)
Total Bilirubin: 0.8 mg/dL (ref 0.0–1.2)
Total Protein: 7.1 g/dL (ref 6.5–8.1)

## 2023-12-26 LAB — CBC WITH DIFFERENTIAL (CANCER CENTER ONLY)
Abs Immature Granulocytes: 0.02 10*3/uL (ref 0.00–0.07)
Basophils Absolute: 0.1 10*3/uL (ref 0.0–0.1)
Basophils Relative: 1 %
Eosinophils Absolute: 0.1 10*3/uL (ref 0.0–0.5)
Eosinophils Relative: 1 %
HCT: 31.1 % — ABNORMAL LOW (ref 39.0–52.0)
Hemoglobin: 11 g/dL — ABNORMAL LOW (ref 13.0–17.0)
Immature Granulocytes: 0 %
Lymphocytes Relative: 11 %
Lymphs Abs: 0.8 10*3/uL (ref 0.7–4.0)
MCH: 34.5 pg — ABNORMAL HIGH (ref 26.0–34.0)
MCHC: 35.4 g/dL (ref 30.0–36.0)
MCV: 97.5 fL (ref 80.0–100.0)
Monocytes Absolute: 0.8 10*3/uL (ref 0.1–1.0)
Monocytes Relative: 10 %
Neutro Abs: 5.7 10*3/uL (ref 1.7–7.7)
Neutrophils Relative %: 77 %
Platelet Count: 249 10*3/uL (ref 150–400)
RBC: 3.19 MIL/uL — ABNORMAL LOW (ref 4.22–5.81)
RDW: 14.6 % (ref 11.5–15.5)
WBC Count: 7.5 10*3/uL (ref 4.0–10.5)
nRBC: 0 % (ref 0.0–0.2)

## 2023-12-26 LAB — LACTATE DEHYDROGENASE: LDH: 124 U/L (ref 98–192)

## 2023-12-26 LAB — T4, FREE: Free T4: 0.99 ng/dL (ref 0.61–1.12)

## 2023-12-26 LAB — TSH: TSH: 1.67 u[IU]/mL (ref 0.350–4.500)

## 2023-12-26 MED ORDER — SODIUM CHLORIDE 0.9 % IV SOLN
240.0000 mg | Freq: Once | INTRAVENOUS | Status: AC
  Administered 2023-12-26: 240 mg via INTRAVENOUS
  Filled 2023-12-26: qty 24

## 2023-12-26 MED ORDER — SODIUM CHLORIDE 0.9 % IV SOLN
INTRAVENOUS | Status: DC
Start: 2023-12-26 — End: 2023-12-26

## 2023-12-26 NOTE — Assessment & Plan Note (Addendum)
 Resolved from low grade immune related toxicity. Resolved on prednisone .  Report if recurs

## 2023-12-26 NOTE — Assessment & Plan Note (Addendum)
 Completed SRS to 4 lesions on 08/20/23 Interval response on MRI on 3/18 Continue nivolumab  today

## 2023-12-26 NOTE — Assessment & Plan Note (Addendum)
 Mild anemia. B12 500 mcg daily.

## 2023-12-26 NOTE — Assessment & Plan Note (Addendum)
 Monitor with CBC, CMP each visit and H&P for immune related toxicity.

## 2023-12-26 NOTE — Patient Instructions (Signed)
 [facility] - A DEPT OF [facility]  Discharge Instructions: Thank you for choosing [facility] to provide your oncology and hematology care.   If you have a lab appointment with the Cancer Center, please go directly to the Cancer Center and check in at the registration area.   Wear comfortable clothing and clothing appropriate for easy access to any Portacath or PICC line.   We strive to give you quality time with your provider. You may need to reschedule your appointment if you arrive late (15 or more minutes).  Arriving late affects you and other patients whose appointments are after yours.  Also, if you miss three or more appointments without notifying the office, you may be dismissed from the clinic at the provider's discretion.      For prescription refill requests, have your pharmacy contact our office and allow 72 hours for refills to be completed.    Today you received the following chemotherapy and/or immunotherapy agents: Opdivo      To help prevent nausea and vomiting after your treatment, we encourage you to take your nausea medication as directed.  BELOW ARE SYMPTOMS THAT SHOULD BE REPORTED IMMEDIATELY: *FEVER GREATER THAN 100.4 F (38 C) OR HIGHER *CHILLS OR SWEATING *NAUSEA AND VOMITING THAT IS NOT CONTROLLED WITH YOUR NAUSEA MEDICATION *UNUSUAL SHORTNESS OF BREATH *UNUSUAL BRUISING OR BLEEDING *URINARY PROBLEMS (pain or burning when urinating, or frequent urination) *BOWEL PROBLEMS (unusual diarrhea, constipation, pain near the anus) TENDERNESS IN MOUTH AND THROAT WITH OR WITHOUT PRESENCE OF ULCERS (sore throat, sores in mouth, or a toothache) UNUSUAL RASH, SWELLING OR PAIN  UNUSUAL VAGINAL DISCHARGE OR ITCHING   Items with * indicate a potential emergency and should be followed up as soon as possible or go to the Emergency Department if any problems should occur.  Please show the CHEMOTHERAPY ALERT CARD or IMMUNOTHERAPY  ALERT CARD at check-in to the Emergency Department and triage nurse.  Should you have questions after your visit or need to cancel or reschedule your appointment, please contact [facility]  Dept: [phone]  and follow the prompts.  Office hours are 8:00 a.m. to 4:30 p.m. Monday - Friday. Please note that voicemails left after 4:00 p.m. may not be returned until the following business day.  We are closed weekends and major holidays. You have access to a nurse at all times for urgent questions. Please call the main number to the clinic Dept: [phone] and follow the prompts.   For any non-urgent questions, you may also contact your provider using MyChart. We now offer e-Visits for anyone 57 and older to request care online for non-urgent symptoms. For details visit [email or web address].   Also download the MyChart app! Go to the app store, search "MyChart", open the app, select , and log in with your MyChart username and password.

## 2023-12-26 NOTE — Assessment & Plan Note (Addendum)
 From PET: Resolution of previously described multifocal hypermetabolic osseous metastasis.

## 2023-12-26 NOTE — Assessment & Plan Note (Addendum)
 Excellent response resume nivolumab  today (4/24) Monitor for irAE Repeat labs and physical exam before each treatment.

## 2023-12-28 ENCOUNTER — Other Ambulatory Visit: Payer: Self-pay

## 2023-12-28 ENCOUNTER — Other Ambulatory Visit: Payer: Self-pay | Admitting: Family Medicine

## 2023-12-28 ENCOUNTER — Other Ambulatory Visit: Payer: Self-pay | Admitting: Internal Medicine

## 2023-12-28 DIAGNOSIS — I10 Essential (primary) hypertension: Secondary | ICD-10-CM

## 2024-01-06 ENCOUNTER — Encounter: Payer: Medicare HMO | Admitting: Family Medicine

## 2024-01-06 ENCOUNTER — Ambulatory Visit (INDEPENDENT_AMBULATORY_CARE_PROVIDER_SITE_OTHER): Admitting: Family Medicine

## 2024-01-06 ENCOUNTER — Encounter: Payer: Self-pay | Admitting: Family Medicine

## 2024-01-06 VITALS — BP 124/78 | HR 68 | Temp 97.9°F | Ht 70.5 in | Wt 179.0 lb

## 2024-01-06 DIAGNOSIS — I480 Paroxysmal atrial fibrillation: Secondary | ICD-10-CM

## 2024-01-06 DIAGNOSIS — C439 Malignant melanoma of skin, unspecified: Secondary | ICD-10-CM | POA: Diagnosis not present

## 2024-01-06 DIAGNOSIS — D485 Neoplasm of uncertain behavior of skin: Secondary | ICD-10-CM | POA: Diagnosis not present

## 2024-01-06 DIAGNOSIS — Z0001 Encounter for general adult medical examination with abnormal findings: Secondary | ICD-10-CM | POA: Diagnosis not present

## 2024-01-06 DIAGNOSIS — I10 Essential (primary) hypertension: Secondary | ICD-10-CM

## 2024-01-06 DIAGNOSIS — L812 Freckles: Secondary | ICD-10-CM | POA: Diagnosis not present

## 2024-01-06 DIAGNOSIS — Z85828 Personal history of other malignant neoplasm of skin: Secondary | ICD-10-CM | POA: Diagnosis not present

## 2024-01-06 DIAGNOSIS — M1A9XX0 Chronic gout, unspecified, without tophus (tophi): Secondary | ICD-10-CM | POA: Diagnosis not present

## 2024-01-06 DIAGNOSIS — L738 Other specified follicular disorders: Secondary | ICD-10-CM | POA: Diagnosis not present

## 2024-01-06 DIAGNOSIS — D225 Melanocytic nevi of trunk: Secondary | ICD-10-CM | POA: Diagnosis not present

## 2024-01-06 DIAGNOSIS — L821 Other seborrheic keratosis: Secondary | ICD-10-CM | POA: Diagnosis not present

## 2024-01-06 DIAGNOSIS — E785 Hyperlipidemia, unspecified: Secondary | ICD-10-CM

## 2024-01-06 DIAGNOSIS — Z85820 Personal history of malignant melanoma of skin: Secondary | ICD-10-CM | POA: Diagnosis not present

## 2024-01-06 DIAGNOSIS — L853 Xerosis cutis: Secondary | ICD-10-CM | POA: Diagnosis not present

## 2024-01-06 DIAGNOSIS — L570 Actinic keratosis: Secondary | ICD-10-CM | POA: Diagnosis not present

## 2024-01-06 NOTE — Assessment & Plan Note (Signed)
 Follows with cardiology for this.  Discussed checking labs however he deferred for today.

## 2024-01-06 NOTE — Assessment & Plan Note (Signed)
 Irregular today though rate controlled on metoprolol .  Anticoagulated on Eliquis  per cardiology.

## 2024-01-06 NOTE — Assessment & Plan Note (Signed)
 Blood pressure well-controlled today on current regimen per cardiology with amlodipine  5 mg daily, valsartan  80 mg daily, and metoprolol  succinate 50 mg daily.

## 2024-01-06 NOTE — Patient Instructions (Signed)
 It was very nice to see you today!  Please continue to work on diet and exercise.  Let us  know if you need any referrals.  We will see you back in a year.   Return in about 1 year (around 01/05/2025) for Annual Physical.   Take care, Dr Daneil Dunker  PLEASE NOTE:  If you had any lab tests, please let us  know if you have not heard back within a few days. You may see your results on mychart before we have a chance to review them but we will give you a call once they are reviewed by us .   If we ordered any referrals today, please let us  know if you have not heard from their office within the next week.   If you had any urgent prescriptions sent in today, please check with the pharmacy within an hour of our visit to make sure the prescription was transmitted appropriately.   Please try these tips to maintain a healthy lifestyle:  Eat at least 3 REAL meals and 1-2 snacks per day.  Aim for no more than 5 hours between eating.  If you eat breakfast, please do so within one hour of getting up.   Each meal should contain half fruits/vegetables, one quarter protein, and one quarter carbs (no bigger than a computer mouse)  Cut down on sweet beverages. This includes juice, soda, and sweet tea.   Drink at least 1 glass of water  with each meal and aim for at least 8 glasses per day  Exercise at least 150 minutes every week.    Preventive Care 8 Years and Older, Male Preventive care refers to lifestyle choices and visits with your health care provider that can promote health and wellness. Preventive care visits are also called wellness exams. What can I expect for my preventive care visit? Counseling During your preventive care visit, your health care provider may ask about your: Medical history, including: Past medical problems. Family medical history. History of falls. Current health, including: Emotional well-being. Home life and relationship well-being. Sexual activity. Memory and ability  to understand (cognition). Lifestyle, including: Alcohol, nicotine or tobacco, and drug use. Access to firearms. Diet, exercise, and sleep habits. Work and work Astronomer. Sunscreen use. Safety issues such as seatbelt and bike helmet use. Physical exam Your health care provider will check your: Height and weight. These may be used to calculate your BMI (body mass index). BMI is a measurement that tells if you are at a healthy weight. Waist circumference. This measures the distance around your waistline. This measurement also tells if you are at a healthy weight and may help predict your risk of certain diseases, such as type 2 diabetes and high blood pressure. Heart rate and blood pressure. Body temperature. Skin for abnormal spots. What immunizations do I need?  Vaccines are usually given at various ages, according to a schedule. Your health care provider will recommend vaccines for you based on your age, medical history, and lifestyle or other factors, such as travel or where you work. What tests do I need? Screening Your health care provider may recommend screening tests for certain conditions. This may include: Lipid and cholesterol levels. Diabetes screening. This is done by checking your blood sugar (glucose) after you have not eaten for a while (fasting). Hepatitis C test. Hepatitis B test. HIV (human immunodeficiency virus) test. STI (sexually transmitted infection) testing, if you are at risk. Lung cancer screening. Colorectal cancer screening. Prostate cancer screening. Abdominal aortic aneurysm (AAA) screening.  You may need this if you are a current or former smoker. Talk with your health care provider about your test results, treatment options, and if necessary, the need for more tests. Follow these instructions at home: Eating and drinking  Eat a diet that includes fresh fruits and vegetables, whole grains, lean protein, and low-fat dairy products. Limit your intake  of foods with high amounts of sugar, saturated fats, and salt. Take vitamin and mineral supplements as recommended by your health care provider. Do not drink alcohol if your health care provider tells you not to drink. If you drink alcohol: Limit how much you have to 0-2 drinks a day. Know how much alcohol is in your drink. In the [place] one drink equals one 12 oz bottle of beer (355 mL), one 5 oz glass of wine (148 mL), or one 1 oz glass of hard liquor (44 mL). Lifestyle Brush your teeth every morning and night with fluoride toothpaste. Floss one time each day. Exercise for at least 30 minutes 5 or more days each week. Do not use any products that contain nicotine or tobacco. These products include cigarettes, chewing tobacco, and vaping devices, such as e-cigarettes. If you need help quitting, ask your health care provider. Do not use drugs. If you are sexually active, practice safe sex. Use a condom or other form of protection to prevent STIs. Take aspirin only as told by your health care provider. Make sure that you understand how much to take and what form to take. Work with your health care provider to find out whether it is safe and beneficial for you to take aspirin daily. Ask your health care provider if you need to take a cholesterol-lowering medicine (statin). Find healthy ways to manage stress, such as: Meditation, yoga, or listening to music. Journaling. Talking to a trusted person. Spending time with friends and family. Safety Always wear your seat belt while driving or riding in a vehicle. Do not drive: If you have been drinking alcohol. Do not ride with someone who has been drinking. When you are tired or distracted. While texting. If you have been using any mind-altering substances or drugs. Wear a helmet and other protective equipment during sports activities. If you have firearms in your house, make sure you follow all gun safety procedures. Minimize exposure to UV  radiation to reduce your risk of skin cancer. What's next? Visit your health care provider once a year for an annual wellness visit. Ask your health care provider how often you should have your eyes and teeth checked. Stay up to date on all vaccines. This information is not intended to replace advice given to you by your health care provider. Make sure you discuss any questions you have with your health care provider. Document Revised: 02/15/2021 Document Reviewed: 02/15/2021 Elsevier Patient Education  2024 [employer].

## 2024-01-06 NOTE — Progress Notes (Signed)
 Chief Complaint:  COSTA MACKIEWICZ is a 83 y.o. male who presents today for his annual comprehensive physical exam.    Assessment/Plan:  Chronic Problems Addressed Today: Dyslipidemia Follows with cardiology for this.  Discussed checking labs however he deferred for today.  Gout Doing well now with allopurinol  300 mg daily.  No recent flares.  He would like to continue with current dose.  He has tried weaning off in the past which has caused flareups of his gout.  HTN (hypertension) Blood pressure well-controlled today on current regimen per cardiology with amlodipine  5 mg daily, valsartan  80 mg daily, and metoprolol  succinate 50 mg daily.  Atrial fibrillation (HCC) Irregular today though rate controlled on metoprolol .  Anticoagulated on Eliquis  per cardiology.  Malignant melanoma Continue management per oncology.  Does admit to this causing more stress however this is manageable.  We discussed referral for counseling however he declined.  He will let us  know if he changes his mind.  Decreased hearing No obvious abnormalities on exam today.  We did discuss referral to see an audiologist however he declined.  He will let us  know if he needs any further assistance with this.  Preventative Healthcare: Defer checking labs today.  He can get shingles vaccine at the pharmacy.  Up-to-date on other vaccines and screenings.  Patient Counseling(The following topics were reviewed and/or handout was given):  -Nutrition: Stressed importance of moderation in sodium/caffeine intake, saturated fat and cholesterol, caloric balance, sufficient intake of fresh fruits, vegetables, and fiber.  -Stressed the importance of regular exercise.   -Substance Abuse: Discussed cessation/primary prevention of tobacco, alcohol, or other drug use; driving or other dangerous activities under the influence; availability of treatment for abuse.   -Injury prevention: Discussed safety belts, safety helmets, smoke detector,  smoking near bedding or upholstery.   -Sexuality: Discussed sexually transmitted diseases, partner selection, use of condoms, avoidance of unintended pregnancy and contraceptive alternatives.   -Dental health: Discussed importance of regular tooth brushing, flossing, and dental visits.  -Health maintenance and immunizations reviewed. Please refer to Health maintenance section.  Return to care in 1 year for next preventative visit.     Subjective:  HPI:  Patient here today for annual physical.  No acute concerns.  See A/P for status of chronic conditions.  Since our last visit he has been following with oncology for malignant melanoma.  Had brain radiation.  Does note that this has been a stressful period of time however he seems to be handling well.  Lifestyle Diet: Balanced. Plenty of fruits and vegetables.  Exercise: Exercising 3 days per week and walking daily.      01/06/2024    9:25 AM  Depression screen PHQ 2/9  Decreased Interest 0  Down, Depressed, Hopeless 0  PHQ - 2 Score 0   There are no preventive care reminders to display for this patient.   ROS: Per HPI, otherwise a complete review of systems was negative.   PMH:  The following were reviewed and entered/updated in epic: Past Medical History:  Diagnosis Date   Anticoagulant long-term use    Atrial fibrillation, permanent (HCC)    CARIOLOGIST-  DR Rodolfo Clan   Bladder diverticulum    BPH (benign prostatic hypertrophy)    ED (erectile dysfunction) of organic origin    Elevated LFTs    CHRONIC   Gall stones    Gout    per pt 12-09-2013  gout is stable   Hematuria    History of pancreatitis  10/2012--  resolved    History of TIA (transient ischemic attack)    04/2009--  no residual   Hyperlipemia    Hypertension    Liver disease    Melanoma (HCC)    Personal history of colonic polyps 07/12/2008   TUBULAR ADENOMA   Rosacea    Patient Active Problem List   Diagnosis Date Noted   Diarrhea 11/29/2023   Low  serum vitamin B12 10/04/2023   At risk for side effect of medication 09/06/2023   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) 08/11/2023   Macrocytic anemia 07/18/2023   Bone lesion 07/18/2023   Abnormal PET scan of colon 07/16/2023   Insomnia 07/04/2023   Hyponatremia 07/04/2023   Leg edema 07/04/2023   Bradycardia 10/09/2022   Hepatic cirrhosis (HCC) 11/30/2021   Anemia, iron deficiency 04/16/2018   Malignant melanoma (HCC) 01/01/2015   Hematuria 07/23/2013   Polyneuropathy 07/23/2013   Numbness 07/22/2013   Erectile dysfunction 11/20/2012   Encounter for long-term (current) use of other medications 07/09/2012   GERD 12/07/2008   BPH (benign prostatic hyperplasia) 11/04/2008   Unilateral femoral hernia 10/07/2008   Hemorrhoids 10/06/2008   History of colonic polyps 10/06/2008   ECZEMA 06/03/2008   Dyslipidemia 08/06/2007   HTN (hypertension) 08/06/2007   Gout 05/14/2007   Atrial fibrillation (HCC) 05/14/2007   Past Surgical History:  Procedure Laterality Date   CARDIAC CATHETERIZATION  09-19-1998   false positive stress test---  normal cath and preserved lvf   CATARACT EXTRACTION W/ INTRAOCULAR LENS  IMPLANT, BILATERAL Bilateral 2008   CYSTOSCOPY WITH BIOPSY N/A 12/14/2013   Procedure: CYSTOSCOPY WITH BIOPSY;  Surgeon: Trent Frizzle, MD;  Location: [facility] [place];  Service: Urology;  Laterality: N/A;   INGUINAL HERNIA REPAIR Left 01-14-2001   LAPAROSCOPIC CHOLECYSTECTOMY  10-21-2008   AND LIVER BX'S   LAPAROSCOPIC INGUINAL HERNIA REPAIR Bilateral 05-22-2009    Family History  Problem Relation Age of Onset   Lung cancer Mother    Skin cancer Sister    Colon cancer Neg Hx    Esophageal cancer Neg Hx    Pancreatic cancer Neg Hx    Stomach cancer Neg Hx    Rectal cancer Neg Hx     Medications- reviewed and updated Current Outpatient Medications  Medication Sig Dispense Refill   allopurinol  (ZYLOPRIM ) 300 MG tablet TAKE 1 TABLET BY MOUTH EVERY DAY  90 tablet 0   amLODipine  (NORVASC ) 5 MG tablet TAKE 1 TABLET (5 MG TOTAL) BY MOUTH DAILY. 30 tablet 0   apixaban  (ELIQUIS ) 5 MG TABS tablet Take 1 tablet (5 mg total) by mouth 2 (two) times daily. 60 tablet 5   famotidine  (PEPCID ) 20 MG tablet Take 1 tablet (20 mg total) by mouth 2 (two) times daily. 30 tablet 0   ferrous sulfate 325 (65 FE) MG tablet Take 325 mg by mouth daily.     finasteride (PROSCAR) 5 MG tablet Take 5 mg by mouth.     metoprolol  succinate (TOPROL -XL) 50 MG 24 hr tablet TAKE 1 TABLET DAILY WITH OR IMMEDIATELY FOLLOWING A MEAL. PATIENT *NEED OFFICE VISIT [phone]. 60 tablet 0   tamsulosin  (FLOMAX ) 0.4 MG CAPS capsule Take 1 capsule (0.4 mg total) by mouth daily. 30 capsule 0   valsartan  (DIOVAN ) 80 MG tablet TAKE 1 TABLET BY MOUTH EVERY DAY 60 tablet 0   ampicillin (PRINCIPEN) 500 MG capsule Take 500 mg by mouth 2 (two) times daily as needed. (Patient not taking: Reported on 01/06/2024)  ondansetron  (ZOFRAN ) 8 MG tablet Take 1 tablet (8 mg total) by mouth every 8 (eight) hours as needed for nausea or vomiting. (Patient not taking: Reported on 01/06/2024) 30 tablet 1   prochlorperazine  (COMPAZINE ) 10 MG tablet Take 1 tablet (10 mg total) by mouth every 6 (six) hours as needed for nausea or vomiting. (Patient not taking: Reported on 01/06/2024) 30 tablet 1   No current facility-administered medications for this visit.    Allergies-reviewed and updated Allergies  Allergen Reactions   Pertussis Vaccines     Social History   Socioeconomic History   Marital status: Married    Spouse name: Not on file   Number of children: 2   Years of education: Not on file   Highest education level: Bachelor's degree (e.g., BA, AB, BS)  Occupational History   Occupation: retired    Associate Professor: RETIRED  Tobacco Use   Smoking status: Never   Smokeless tobacco: Never  Vaping Use   Vaping status: Never Used  Substance and Sexual Activity   Alcohol use: Yes    Alcohol/week: 14.0  standard drinks of alcohol    Types: 14 Shots of liquor per week    Comment: 3 oz. per day   Drug use: No   Sexual activity: Yes    Partners: Female  Other Topics Concern   Not on file  Social History Narrative   Not on file   Social Drivers of Health   Financial Resource Strain: Low Risk  (12/05/2023)   Overall Financial Resource Strain (CARDIA)    Difficulty of Paying Living Expenses: Not hard at all  Food Insecurity: No Food Insecurity (12/05/2023)   Hunger Vital Sign    Worried About Running Out of Food in the Last Year: Never true    Ran Out of Food in the Last Year: Never true  Transportation Needs: No Transportation Needs (12/05/2023)   PRAPARE - Administrator, Civil Service (Medical): No    Lack of Transportation (Non-Medical): No  Physical Activity: Insufficiently Active (12/05/2023)   Exercise Vital Sign    Days of Exercise per Week: 5 days    Minutes of Exercise per Session: 20 min  Stress: Stress Concern Present (12/05/2023)   [employer] of Occupational Health - Occupational Stress Questionnaire    Feeling of Stress : To some extent  Social Connections: Socially Integrated (12/05/2023)   Social Connection and Isolation Panel [NHANES]    Frequency of Communication with Friends and Family: Three times a week    Frequency of Social Gatherings with Friends and Family: Twice a week    Attends Religious Services: More than 4 times per year    Active Member of [employer] or Organizations: Yes    Attends Engineer, structural: More than 4 times per year    Marital Status: Married        Objective:  Physical Exam: BP 124/78   Pulse 68   Temp 97.9 F (36.6 C) (Temporal)   Ht 5' 10.5" (1.791 m)   Wt 179 lb (81.2 kg)   SpO2 99%   BMI 25.32 kg/m   Body mass index is 25.32 kg/m. Wt Readings from Last 3 Encounters:  01/06/24 179 lb (81.2 kg)  12/26/23 178 lb 9.6 oz (81 kg)  12/12/23 177 lb 3.2 oz (80.4 kg)   Gen: NAD, resting comfortably HEENT: TMs  normal bilaterally. OP clear. No thyromegaly noted.  CV: Irregular with no murmurs appreciated Pulm: NWOB, CTAB with no crackles, wheezes,  or rhonchi GI: Normal bowel sounds present. Soft, Nontender, Nondistended. MSK: no edema, cyanosis, or clubbing noted Skin: warm, dry Neuro: CN2-12 grossly intact. Strength 5/5 in upper and lower extremities. Reflexes symmetric and intact bilaterally.  Psych: Normal affect and thought content     Haven Pylant M. Daneil Dunker, MD 01/06/2024 10:16 AM

## 2024-01-06 NOTE — Assessment & Plan Note (Signed)
 Doing well now with allopurinol  300 mg daily.  No recent flares.  He would like to continue with current dose.  He has tried weaning off in the past which has caused flareups of his gout.

## 2024-01-09 ENCOUNTER — Inpatient Hospital Stay

## 2024-01-09 ENCOUNTER — Inpatient Hospital Stay: Admitting: Dietician

## 2024-01-09 VITALS — BP 130/71 | HR 51 | Temp 97.9°F | Resp 17 | Wt 180.3 lb

## 2024-01-09 DIAGNOSIS — C438 Malignant melanoma of overlapping sites of skin: Secondary | ICD-10-CM

## 2024-01-09 DIAGNOSIS — C434 Malignant melanoma of scalp and neck: Secondary | ICD-10-CM

## 2024-01-09 DIAGNOSIS — Z9189 Other specified personal risk factors, not elsewhere classified: Secondary | ICD-10-CM | POA: Diagnosis not present

## 2024-01-09 DIAGNOSIS — C7931 Secondary malignant neoplasm of brain: Secondary | ICD-10-CM | POA: Insufficient documentation

## 2024-01-09 DIAGNOSIS — C7800 Secondary malignant neoplasm of unspecified lung: Secondary | ICD-10-CM | POA: Insufficient documentation

## 2024-01-09 DIAGNOSIS — Z79899 Other long term (current) drug therapy: Secondary | ICD-10-CM | POA: Diagnosis not present

## 2024-01-09 DIAGNOSIS — E538 Deficiency of other specified B group vitamins: Secondary | ICD-10-CM | POA: Diagnosis not present

## 2024-01-09 DIAGNOSIS — Z5112 Encounter for antineoplastic immunotherapy: Secondary | ICD-10-CM | POA: Diagnosis not present

## 2024-01-09 DIAGNOSIS — D649 Anemia, unspecified: Secondary | ICD-10-CM | POA: Insufficient documentation

## 2024-01-09 LAB — CMP (CANCER CENTER ONLY)
ALT: 10 U/L (ref 0–44)
AST: 16 U/L (ref 15–41)
Albumin: 4.1 g/dL (ref 3.5–5.0)
Alkaline Phosphatase: 76 U/L (ref 38–126)
Anion gap: 7 (ref 5–15)
BUN: 24 mg/dL — ABNORMAL HIGH (ref 8–23)
CO2: 28 mmol/L (ref 22–32)
Calcium: 9.3 mg/dL (ref 8.9–10.3)
Chloride: 96 mmol/L — ABNORMAL LOW (ref 98–111)
Creatinine: 1.04 mg/dL (ref 0.61–1.24)
GFR, Estimated: >60 mL/min (ref >60)
Glucose, Bld: 173 mg/dL — ABNORMAL HIGH (ref 70–99)
Potassium: 4 mmol/L (ref 3.5–5.1)
Sodium: 131 mmol/L — ABNORMAL LOW (ref 135–145)
Total Bilirubin: 0.9 mg/dL (ref 0.0–1.2)
Total Protein: 6.9 g/dL (ref 6.5–8.1)

## 2024-01-09 LAB — CBC WITH DIFFERENTIAL (CANCER CENTER ONLY)
Abs Immature Granulocytes: 0.02 10*3/uL (ref 0.00–0.07)
Basophils Absolute: 0.1 10*3/uL (ref 0.0–0.1)
Basophils Relative: 1 %
Eosinophils Absolute: 0 10*3/uL (ref 0.0–0.5)
Eosinophils Relative: 1 %
HCT: 32.8 % — ABNORMAL LOW (ref 39.0–52.0)
Hemoglobin: 11.5 g/dL — ABNORMAL LOW (ref 13.0–17.0)
Immature Granulocytes: 0 %
Lymphocytes Relative: 14 %
Lymphs Abs: 0.9 10*3/uL (ref 0.7–4.0)
MCH: 34 pg (ref 26.0–34.0)
MCHC: 35.1 g/dL (ref 30.0–36.0)
MCV: 97 fL (ref 80.0–100.0)
Monocytes Absolute: 0.7 10*3/uL (ref 0.1–1.0)
Monocytes Relative: 11 %
Neutro Abs: 4.8 10*3/uL (ref 1.7–7.7)
Neutrophils Relative %: 73 %
Platelet Count: 225 10*3/uL (ref 150–400)
RBC: 3.38 MIL/uL — ABNORMAL LOW (ref 4.22–5.81)
RDW: 15 % (ref 11.5–15.5)
WBC Count: 6.5 10*3/uL (ref 4.0–10.5)
nRBC: 0 % (ref 0.0–0.2)

## 2024-01-09 LAB — LACTATE DEHYDROGENASE: LDH: 123 U/L (ref 98–192)

## 2024-01-09 LAB — TSH: TSH: 1.87 u[IU]/mL (ref 0.350–4.500)

## 2024-01-09 MED ORDER — SODIUM CHLORIDE 0.9 % IV SOLN: INTRAVENOUS | Status: DC

## 2024-01-09 MED ORDER — SODIUM CHLORIDE 0.9 % IV SOLN
240.0000 mg | Freq: Once | INTRAVENOUS | Status: AC
  Administered 2024-01-09: 240 mg via INTRAVENOUS
  Filled 2024-01-09: qty 24

## 2024-01-09 MED ORDER — HEPARIN SOD (PORK) LOCK FLUSH 100 UNIT/ML IV SOLN: 500.0000 [IU] | Freq: Once | INTRAVENOUS | Status: DC | PRN

## 2024-01-09 MED ORDER — SODIUM CHLORIDE 0.9% FLUSH: 10.0000 mL | INTRAVENOUS | Status: DC | PRN

## 2024-01-09 NOTE — Patient Instructions (Signed)
 [facility] - A DEPT OF [facility]  Discharge Instructions: Thank you for choosing [facility] to provide your oncology and hematology care.   If you have a lab appointment with the Cancer Center, please go directly to the Cancer Center and check in at the registration area.   Wear comfortable clothing and clothing appropriate for easy access to any Portacath or PICC line.   We strive to give you quality time with your provider. You may need to reschedule your appointment if you arrive late (15 or more minutes).  Arriving late affects you and other patients whose appointments are after yours.  Also, if you miss three or more appointments without notifying the office, you may be dismissed from the clinic at the provider's discretion.      For prescription refill requests, have your pharmacy contact our office and allow 72 hours for refills to be completed.    Today you received the following chemotherapy and/or immunotherapy agents: Opdivo      To help prevent nausea and vomiting after your treatment, we encourage you to take your nausea medication as directed.  BELOW ARE SYMPTOMS THAT SHOULD BE REPORTED IMMEDIATELY: *FEVER GREATER THAN 100.4 F (38 C) OR HIGHER *CHILLS OR SWEATING *NAUSEA AND VOMITING THAT IS NOT CONTROLLED WITH YOUR NAUSEA MEDICATION *UNUSUAL SHORTNESS OF BREATH *UNUSUAL BRUISING OR BLEEDING *URINARY PROBLEMS (pain or burning when urinating, or frequent urination) *BOWEL PROBLEMS (unusual diarrhea, constipation, pain near the anus) TENDERNESS IN MOUTH AND THROAT WITH OR WITHOUT PRESENCE OF ULCERS (sore throat, sores in mouth, or a toothache) UNUSUAL RASH, SWELLING OR PAIN  UNUSUAL VAGINAL DISCHARGE OR ITCHING   Items with * indicate a potential emergency and should be followed up as soon as possible or go to the Emergency Department if any problems should occur.  Please show the CHEMOTHERAPY ALERT CARD or IMMUNOTHERAPY  ALERT CARD at check-in to the Emergency Department and triage nurse.  Should you have questions after your visit or need to cancel or reschedule your appointment, please contact [facility]  Dept: [phone]  and follow the prompts.  Office hours are 8:00 a.m. to 4:30 p.m. Monday - Friday. Please note that voicemails left after 4:00 p.m. may not be returned until the following business day.  We are closed weekends and major holidays. You have access to a nurse at all times for urgent questions. Please call the main number to the clinic Dept: [phone] and follow the prompts.   For any non-urgent questions, you may also contact your provider using MyChart. We now offer e-Visits for anyone 57 and older to request care online for non-urgent symptoms. For details visit [email or web address].   Also download the MyChart app! Go to the app store, search "MyChart", open the app, select , and log in with your MyChart username and password.

## 2024-01-09 NOTE — Assessment & Plan Note (Addendum)
 Excellent response Continue nivolumab  today Monitor for irAE Repeat labs and physical exam before each treatment.

## 2024-01-09 NOTE — Assessment & Plan Note (Addendum)
 Mild anemia. B12 500 mcg daily. Check B12 next time.

## 2024-01-09 NOTE — Progress Notes (Signed)
 [facility] OFFICE PROGRESS NOTE  Patient Care Team: Rodney Clamp, MD as PCP - General (Family Medicine) Verona Goodwill, MD as PCP - Electrophysiology (Cardiology) Verona Goodwill, MD (Cardiology) Trent Frizzle, MD as Attending Physician (Urology) Harlen Lick, MD as Consulting Physician (Dermatology) Alvina Axon, MD as Consulting Physician (Ophthalmology) Tobin Forts, MD as Consulting Physician (Gastroenterology) Rolando Cliche, [facility] as Pharmacist (Pharmacist) Karren Paddock, RN as [facility] Care Management  83 y.o.man undergoing nivolumab  for stage IV melanoma being follow up for metastatic melanoma on treatment. Recently with immune related side effects of diarrhea.   PET from 3/20 showed excellent response after 6 doses of nivo.    Clinically well after rechallenge off steroid. Diarrhea without recurrence. Will resume treatment today. Continue to monitor lab and other potential toxicities. Assessment & Plan Malignant melanoma of scalp (HCC) Excellent response Continue nivolumab  today Monitor for irAE Repeat labs and physical exam before each treatment. At risk for side effect of medication Monitor with CBC, CMP each visit and H&P for immune related toxicity.  Previously with diarrhea. Resolved. Low serum vitamin B12 Mild anemia. B12 500 mcg daily. Check B12 next time. Malignant melanoma of overlapping sites [facility]) Excellent response Continue nivolumab  today Monitor for irAE Repeat labs and physical exam before each treatment.  Orders Placed This Encounter  Procedures   Lactate dehydrogenase    Standing Status:   Future    Expected Date:   02/20/2024    Expiration Date:   02/19/2025   CBC with Differential (Cancer Center Only)    Standing Status:   Future    Expected Date:   02/20/2024    Expiration Date:   02/19/2025   CMP (Cancer Center only)    Standing Status:   Future    Expected Date:   02/20/2024    Expiration Date:    02/19/2025   T4, free    Standing Status:   Future    Expected Date:   02/20/2024    Expiration Date:   02/19/2025   TSH    Standing Status:   Future    Expected Date:   02/20/2024    Expiration Date:   02/19/2025   Lactate dehydrogenase    Standing Status:   Future    Expected Date:   03/05/2024    Expiration Date:   03/05/2025   CBC with Differential (Cancer Center Only)    Standing Status:   Future    Expected Date:   03/05/2024    Expiration Date:   03/05/2025   CMP (Cancer Center only)    Standing Status:   Future    Expected Date:   03/05/2024    Expiration Date:   03/05/2025   T4, free    Standing Status:   Future    Expected Date:   03/05/2024    Expiration Date:   03/05/2025   TSH    Standing Status:   Future    Expected Date:   03/05/2024    Expiration Date:   03/05/2025   Lactate dehydrogenase    Standing Status:   Future    Expected Date:   03/19/2024    Expiration Date:   03/19/2025   CBC with Differential (Cancer Center Only)    Standing Status:   Future    Expected Date:   03/19/2024    Expiration Date:   03/19/2025   CMP (Cancer Center only)    Standing Status:   Future  Expected Date:   03/19/2024    Expiration Date:   03/19/2025   T4, free    Standing Status:   Future    Expected Date:   03/19/2024    Expiration Date:   03/19/2025   TSH    Standing Status:   Future    Expected Date:   03/19/2024    Expiration Date:   03/19/2025   Folate    Standing Status:   Future    Expected Date:   01/23/2024    Expiration Date:   01/22/2025     Nicholas Ruddy, MD  INTERVAL HISTORY: Patient returns for follow-up. No diarrhea, stomach, rash, coughing, short of breath, chest pain. No mass palpable. No new headaches. No new weakness.  Oncology History  Malignant melanoma (HCC)  01/01/2015 Initial Diagnosis   Malignant melanoma (HCC)   12/2014 Surgery   April 2016 WLE and SLN 01/31/2015: Reexcision    02/25/2018 Imaging   CT CAP Subcentimeter right upper lobe subpleural and left upper  lobe perifissural nodules, new from 01/24/2016, indeterminate. Recommend short interval follow-up (3 months), given histor    04/07/2019 Imaging   CT Chest: No evidence of metastatic disease within the chest. Left upper lobe perifissural nodule no longer evident. Right upper lobe subpleural nodule no longer evident.   CT AP No evidence of metastatic disease.     04/2020 Pathology Results   Summer 2021: New scalp lesion, in transit  path: -Malignant melanoma, involving dermal tissue    04/12/2020 Imaging   CT chest: NED CT AP: NED    05/04/2020 Imaging   CT Neck: No mass or lymphadenopathy within the neck.    05/25/2020 - 07/25/2020 Chemotherapy   5 cycles of TVEC   01/24/2021 PET scan   PET whole body - No abnormal foci of increased radiotracer uptake to suggest metastatic disease.  -Small focus of increased uptake within the proximal sigmoid colon without adjacent stranding or definite wall thickening, indeterminant. Recommend further evaluation with colonoscopy if clinically    08/24/2021 PET scan   PET whole body - No abnormal foci of increased radiotracer uptake to suggest metastatic disease.   - Small focus of increased uptake within the proximal sigmoid colon, similar to 01/24/2021. Recommend clinical correlation and/or further evaluation with colonoscopy if clinically indicated    01/31/2022 Surgery   (Outside case, [phone], 1 slide, 01/23/2022) Skin, left inferior lateral anterior scalp, shave - Metastatic malignant melanoma Size: 2 mm Pattern: Pigmented epithelioid cells forming small plaque in superficial dermis and showing epidermotropism Tumor infiltrating lymphocytes: Brisk Nuclear grade: 3/3 Margins: Focally present adjacent to edge of tissue sections    02/21/2022 - 05/09/2022 Chemotherapy   had an intransit recurrence of melanoma and was retreated with TVEC.  Biopsy in October 2023 showed NED.    05/09/2022 PET scan   - Mildly avid 3 mm right upper lobe  subcentimeter pulmonary nodule, possibly increased from 2 mm on prior; recommend attention on follow-up.   -No definite evidence of metabolically active disease.    05/28/2022 Pathology Results   Diagnosis   Skin, Left temple, excision - Dermal fibrosis with lymphocytic and granulomatous inflammation consistent with site of recent therapy - No residual melanoma identified - Margins free of tumor      02/15/2023 PET scan   - Focal area of hypermetabolic activity is seen in the ascending colon, indeterminate. Recommend correlation with colonoscopy.   -Unchanged size of mildly hypermetabolic left paratracheal lymph node, indeterminate. Recommend attention on  follow-up imaging.   -Pancreatic calcifications likely reflect sequela of chronic pancreatitis.   -Bladder diverticuli and enlarged prostate.    05/13/2023 PET scan   1.  Evidence of progressive disease as demonstrated by interval enlargement of right upper lobe pulmonary nodule most concerning for metastasis by melanoma. Additional single right pulmonary nodule and 2 sites of focal osseous avidity which are suspicious but indeterminate for additional sites of involvement by metastasis involving the T8 and L1 vertebral bodies.  2.  Focal uptake within the sigmoid colon which has increased in conspicuity over time. Recommend correlation with colonoscopy if not recently completed with differential diagnosis including adenoma, carcinoma, and inflammatory lesion (given associated coarse calcifications/high density material). No evidence of complication (i.e.  fluid collection, or free air) by low-dose unenhanced CT.  3.  Persistent FDG abnormality of the left anterior shin associated with a dermal nodule. Recommend direct inspection as additional site of cutaneous neoplasm is not excluded.    06/14/2023 Pathology Results   A: Lung, right upper lobe, lobectomy  - Metastatic melanoma, multifocal (largest focus 2.4 cm, with two sub-centimeter  nodules also identified microscopically, each 3 mm) - Surgical margins negative  - BRAF (VE1): Negative - TILs: brisk - Two lymph nodes, negative for malignancy (0/2)   07/17/2023 Genetic Testing   [place]: pertinent negatives: BRAF. KIT TERT C.-124C>T variant- promoter mutation NRAS p.G13D Missense variant (exon 2) - GOF TP53 p.R342P ARID1A p.G215fs NF1 p.R1362* PHLPP2 p.Q300* NF1 p.K191_R192delinsN*  Multiple VUS findings.   08/05/2023 Imaging   MRI brain  --Single enhancing right occipital cortical lesion is most concerning for intracranial metastatic disease.   --Indeterminate 7 mm enhancing lesion in the right posterior parietal bone. There is no definite FDG avidity in this region on the recent PET/CT, arguing against malignancy, however close attention to this area on follow-up imaging is recommended.    08/05/2023 Imaging   CT chest IMPRESSION:   -Right upper lobectomy with moderate sized loculated right pleural effusion. New 0.3 cm right lower lobe micronodule. Recommend attention on follow-up CT in 3 months.   Few subcentimeter right infra-axillary lymph nodes with adjacent fat stranding and benign morphology, new since prior. Recommendation follow-up CT.   Markedly enlarged left atrium and mild qualitative dilatation of right atrium. Extensive coronary artery calcifications.    09/06/2023 -  Chemotherapy   Patient is on Treatment Plan : METASTATIC MELANOMA Nivolumab  (240) q14d     09/19/2023 Cancer Staging   Staging form: Melanoma of the Skin, AJCC 7th Edition - Clinical: Stage IV (TX, N0, M1c) - Signed by Nicholas Ruddy, MD on 09/19/2023 Biopsy of metastatic site performed: Yes Source of metastatic specimen: Lung   11/19/2023 Imaging   MRI brain: Positive treatment response, previously seen brain lesions are no longer detectable. No new lesion.   11/21/2023 PET scan   PET: after 6C of nivo every 2 weeks. Marked response to therapy, including resolution of hepatic  and osseous hypermetabolism.      PHYSICAL EXAMINATION: ECOG PERFORMANCE STATUS: 1 - Symptomatic but completely ambulatory  Vitals:   01/09/24 1224  BP: 130/71  Pulse: (!) 51  Resp: 17  Temp: 97.9 F (36.6 C)  SpO2: 100%   Filed Weights   01/09/24 1224  Weight: 180 lb 4.8 oz (81.8 kg)    GENERAL: alert, no distress and comfortable SKIN: skin color normal and underlying eczema in face and extremities  EYES: normal, sclera clear OROPHARYNX: no exudate  NECK: No palpable mass LYMPH:  no  palpable cervical lymphadenopathy  LUNGS: clear to auscultation and percussion with normal breathing effort HEART: regular rate & rhythm  ABDOMEN: abdomen soft, non-tender and nondistended. Musculoskeletal: no edema NEURO: no focal motor/sensory deficits  Relevant data reviewed during this visit included labs

## 2024-01-09 NOTE — Assessment & Plan Note (Signed)
 Excellent response Continue nivolumab  today Monitor for irAE Repeat labs and physical exam before each treatment.

## 2024-01-09 NOTE — Assessment & Plan Note (Addendum)
 Monitor with CBC, CMP each visit and H&P for immune related toxicity.  Previously with diarrhea. Resolved.

## 2024-01-09 NOTE — Progress Notes (Signed)
 Nutrition Follow-up:  Pt with metastatic melanoma to brain, lung, bone, and liver. He is receiving nivolumab  q14d (first 09/06/23)   Met with patient in infusion. He is doing well today. Patient pleased with recent scan results. His appetite and intake remain stable. Patient supplementing with one Ensure Complete. Patient has been making an effort to drink a glass of water  4x/day. Hydration status improving per labs. He denies nausea, vomiting, diarrhea, constipation.   Medications: reviewed   Labs: Na 131, glucose 173, BUN 24  Anthropometrics: Pt 180 lb 4.8 oz today  - trending up   4/24 - 178 lb 9.6 oz 4/10 - 177 lb 3.2 oz 3/14 - 178 lb 9.6 oz    NUTRITION DIAGNOSIS: Food and nutrition related knowledge deficit continues    INTERVENTION:  Continue daily Ensure Complete Continue strategies for improving hydration    MONITORING, EVALUATION, GOAL: wt trends, intake   NEXT VISIT: To be scheduled as needed

## 2024-01-10 NOTE — Addendum Note (Signed)
 Addended by: Janeann Mean on: 01/10/2024 03:45 PM   Modules accepted: Orders

## 2024-01-21 NOTE — Assessment & Plan Note (Addendum)
 Completed SRS to 4 lesions on 08/20/23 Interval response on MRI on 3/18 Continue nivolumab  today Next MRI on 6/18

## 2024-01-21 NOTE — Progress Notes (Signed)
 [name] OFFICE PROGRESS NOTE  Patient Care Team: Nicholas Clamp, MD as PCP - General (Family Medicine) Nicholas Goodwill, MD as PCP - Electrophysiology (Cardiology) Nicholas Goodwill, MD (Cardiology) Nicholas Frizzle, MD as Attending Physician (Urology) Nicholas Lick, MD as Consulting Physician (Dermatology) Nicholas Axon, MD as Consulting Physician (Ophthalmology) Nicholas Forts, MD as Consulting Physician (Gastroenterology) Nicholas Bird, [facility] as Pharmacist (Pharmacist) Nicholas Paddock, RN as [facility] Care Management  83 y.o.man undergoing nivolumab  for stage IV melanoma being follow up for metastatic melanoma on treatment. Recently with immune related side effects of diarrhea.   Current Diagnosis: Metastatic melanoma with brain and lung metastases.  Suspicious for bone metastases. BRAF negative. 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  08/19/23 SRS to 4 brain mets  PET from 3/20 showed excellent response after 6 doses of nivo.    Clinically well after rechallenge off steroid. Diarrhea without recurrence. Will continue treatment today.   Continue to monitor lab and other potential toxicities.  Cycle 9 today. Assessment & Plan Malignant melanoma metastatic to brain [facility]) Completed SRS to 4 lesions on 08/20/23 Interval response on MRI on 3/18 Continue nivolumab  today Next MRI on 6/18 Malignant melanoma of scalp (HCC) Excellent response Continue nivolumab  today Monitor for irAE Repeat labs and physical exam before each treatment. Repeat staging after C12 in mid July Low serum vitamin B12 Mild anemia. B12 500 mcg daily. Follow up folate and B12 today. Previously low-normal folate  Orders Placed This Encounter  Procedures   NM PET Image Restage (PS) Whole Body    Standing Status:   Future    Expected Date:   03/24/2024    Expiration Date:   01/22/2025    If indicated for the ordered procedure, I authorize the  administration of a radiopharmaceutical per Radiology protocol:   Yes    Preferred imaging location?:   [name]   Lactate dehydrogenase    Standing Status:   Future    Expected Date:   04/02/2024    Expiration Date:   04/02/2025   CBC with Differential (Cancer Center Only)    Standing Status:   Future    Expected Date:   04/02/2024    Expiration Date:   04/02/2025   CMP (Cancer Center only)    Standing Status:   Future    Expected Date:   04/02/2024    Expiration Date:   04/02/2025   T4, free    Standing Status:   Future    Expected Date:   04/02/2024    Expiration Date:   04/02/2025   TSH    Standing Status:   Future    Expected Date:   04/02/2024    Expiration Date:   04/02/2025   Lactate dehydrogenase    Standing Status:   Future    Expected Date:   04/16/2024    Expiration Date:   04/16/2025   CBC with Differential (Cancer Center Only)    Standing Status:   Future    Expected Date:   04/16/2024    Expiration Date:   04/16/2025   CMP (Cancer Center only)    Standing Status:   Future    Expected Date:   04/16/2024    Expiration Date:   04/16/2025   T4, free    Standing Status:   Future    Expected Date:   04/16/2024    Expiration Date:   04/16/2025   TSH    Standing  Status:   Future    Expected Date:   04/16/2024    Expiration Date:   04/16/2025     Nicholas Ruddy, MD  INTERVAL HISTORY: Patient returns for follow-up. No diarrhea, stomach pain, rash or worsening eczema, coughing or short of breath. No decreased appetite. Energy is the same.  Oncology History  Malignant melanoma (HCC)  01/01/2015 Initial Diagnosis   Malignant melanoma (HCC)   12/2014 Surgery   April 2016 WLE and SLN 01/31/2015: Reexcision    02/25/2018 Imaging   CT CAP Subcentimeter right upper lobe subpleural and left upper lobe perifissural nodules, new from 01/24/2016, indeterminate. Recommend short interval follow-up (3 months), given histor    04/07/2019 Imaging   CT Chest: No evidence of metastatic  disease within the chest. Left upper lobe perifissural nodule no longer evident. Right upper lobe subpleural nodule no longer evident.   CT AP No evidence of metastatic disease.     04/2020 Pathology Results   Summer 2021: New scalp lesion, in transit  path: -Malignant melanoma, involving dermal tissue    04/12/2020 Imaging   CT chest: NED CT AP: NED    05/04/2020 Imaging   CT Neck: No mass or lymphadenopathy within the neck.    05/25/2020 - 07/25/2020 Chemotherapy   5 cycles of TVEC   01/24/2021 PET scan   PET whole body - No abnormal foci of increased radiotracer uptake to suggest metastatic disease.  -Small focus of increased uptake within the proximal sigmoid colon without adjacent stranding or definite wall thickening, indeterminant. Recommend further evaluation with colonoscopy if clinically    08/24/2021 PET scan   PET whole body - No abnormal foci of increased radiotracer uptake to suggest metastatic disease.   - Small focus of increased uptake within the proximal sigmoid colon, similar to 01/24/2021. Recommend clinical correlation and/or further evaluation with colonoscopy if clinically indicated    01/31/2022 Surgery   (Outside case, [phone], 1 slide, 01/23/2022) Skin, left inferior lateral anterior scalp, shave - Metastatic malignant melanoma Size: 2 mm Pattern: Pigmented epithelioid cells forming small plaque in superficial dermis and showing epidermotropism Tumor infiltrating lymphocytes: Brisk Nuclear grade: 3/3 Margins: Focally present adjacent to edge of tissue sections    02/21/2022 - 05/09/2022 Chemotherapy   had an intransit recurrence of melanoma and was retreated with TVEC.  Biopsy in October 2023 showed NED.    05/09/2022 PET scan   - Mildly avid 3 mm right upper lobe subcentimeter pulmonary nodule, possibly increased from 2 mm on prior; recommend attention on follow-up.   -No definite evidence of metabolically active disease.    05/28/2022 Pathology  Results   Diagnosis   Skin, Left temple, excision - Dermal fibrosis with lymphocytic and granulomatous inflammation consistent with site of recent therapy - No residual melanoma identified - Margins free of tumor      02/15/2023 PET scan   - Focal area of hypermetabolic activity is seen in the ascending colon, indeterminate. Recommend correlation with colonoscopy.   -Unchanged size of mildly hypermetabolic left paratracheal lymph node, indeterminate. Recommend attention on follow-up imaging.   -Pancreatic calcifications likely reflect sequela of chronic pancreatitis.   -Bladder diverticuli and enlarged prostate.    05/13/2023 PET scan   1.  Evidence of progressive disease as demonstrated by interval enlargement of right upper lobe pulmonary nodule most concerning for metastasis by melanoma. Additional single right pulmonary nodule and 2 sites of focal osseous avidity which are suspicious but indeterminate for additional sites of involvement by metastasis  involving the T8 and L1 vertebral bodies.  2.  Focal uptake within the sigmoid colon which has increased in conspicuity over time. Recommend correlation with colonoscopy if not recently completed with differential diagnosis including adenoma, carcinoma, and inflammatory lesion (given associated coarse calcifications/high density material). No evidence of complication (i.e.  fluid collection, or free air) by low-dose unenhanced CT.  3.  Persistent FDG abnormality of the left anterior shin associated with a dermal nodule. Recommend direct inspection as additional site of cutaneous neoplasm is not excluded.    06/14/2023 Pathology Results   A: Lung, right upper lobe, lobectomy  - Metastatic melanoma, multifocal (largest focus 2.4 cm, with two sub-centimeter nodules also identified microscopically, each 3 mm) - Surgical margins negative  - BRAF (VE1): Negative - TILs: brisk - Two lymph nodes, negative for malignancy (0/2)   07/17/2023 Genetic  Testing   [facility]: pertinent negatives: BRAF. KIT TERT C.-124C>T variant- promoter mutation NRAS p.G13D Missense variant (exon 2) - GOF TP53 p.R342P ARID1A p.G285fs NF1 p.R1362* PHLPP2 p.Q300* NF1 p.K191_R192delinsN*  Multiple VUS findings.   08/05/2023 Imaging   MRI brain  --Single enhancing right occipital cortical lesion is most concerning for intracranial metastatic disease.   --Indeterminate 7 mm enhancing lesion in the right posterior parietal bone. There is no definite FDG avidity in this region on the recent PET/CT, arguing against malignancy, however close attention to this area on follow-up imaging is recommended.    08/05/2023 Imaging   CT chest IMPRESSION:   -Right upper lobectomy with moderate sized loculated right pleural effusion. New 0.3 cm right lower lobe micronodule. Recommend attention on follow-up CT in 3 months.   Few subcentimeter right infra-axillary lymph nodes with adjacent fat stranding and benign morphology, new since prior. Recommendation follow-up CT.   Markedly enlarged left atrium and mild qualitative dilatation of right atrium. Extensive coronary artery calcifications.    09/06/2023 -  Chemotherapy   Patient is on Treatment Plan : METASTATIC MELANOMA Nivolumab  (240) q14d     09/19/2023 Cancer Staging   Staging form: Melanoma of the Skin, AJCC 7th Edition - Clinical: Stage IV (TX, N0, M1c) - Signed by Nicholas Ruddy, MD on 09/19/2023 Biopsy of metastatic site performed: Yes Source of metastatic specimen: Lung   11/19/2023 Imaging   MRI brain: Positive treatment response, previously seen brain lesions are no longer detectable. No new lesion.   11/21/2023 PET scan   PET: after 6C of nivo every 2 weeks. Marked response to therapy, including resolution of hepatic and osseous hypermetabolism.      PHYSICAL EXAMINATION: ECOG PERFORMANCE STATUS: 1 - Symptomatic but completely ambulatory  Vitals:   01/23/24 1408  BP: 120/60  Pulse: 60  Resp: 18   Temp: (!) 97.2 F (36.2 C)  SpO2: 99%   Filed Weights   01/23/24 1408  Weight: 180 lb 3.2 oz (81.7 kg)    GENERAL: alert, no distress and comfortable SKIN: skin color normal and no bruising or petechiae or jaundice on exposed skin EYES: normal, sclera clear OROPHARYNX: no exudate  NECK: No palpable mass LYMPH:  no palpable cervical, axillary lymphadenopathy  LUNGS: clear to auscultation and percussion with normal breathing effort HEART: regular rate & rhythm  ABDOMEN: abdomen soft, non-tender and nondistended. Musculoskeletal: no edema NEURO: no focal motor/sensory deficits  Relevant data reviewed during this visit included lab.

## 2024-01-21 NOTE — Assessment & Plan Note (Addendum)
 Excellent response Continue nivolumab  today Monitor for irAE Repeat labs and physical exam before each treatment. Repeat staging after C12 in mid July

## 2024-01-23 ENCOUNTER — Inpatient Hospital Stay

## 2024-01-23 VITALS — BP 120/60 | HR 60 | Temp 97.2°F | Resp 18 | Wt 180.2 lb

## 2024-01-23 DIAGNOSIS — C434 Malignant melanoma of scalp and neck: Secondary | ICD-10-CM

## 2024-01-23 DIAGNOSIS — Z79899 Other long term (current) drug therapy: Secondary | ICD-10-CM | POA: Diagnosis not present

## 2024-01-23 DIAGNOSIS — C7800 Secondary malignant neoplasm of unspecified lung: Secondary | ICD-10-CM | POA: Diagnosis not present

## 2024-01-23 DIAGNOSIS — C7931 Secondary malignant neoplasm of brain: Secondary | ICD-10-CM | POA: Diagnosis not present

## 2024-01-23 DIAGNOSIS — E538 Deficiency of other specified B group vitamins: Secondary | ICD-10-CM | POA: Diagnosis not present

## 2024-01-23 DIAGNOSIS — C438 Malignant melanoma of overlapping sites of skin: Secondary | ICD-10-CM

## 2024-01-23 DIAGNOSIS — Z5112 Encounter for antineoplastic immunotherapy: Secondary | ICD-10-CM | POA: Diagnosis not present

## 2024-01-23 DIAGNOSIS — D649 Anemia, unspecified: Secondary | ICD-10-CM | POA: Diagnosis not present

## 2024-01-23 LAB — CMP (CANCER CENTER ONLY)
ALT: 12 U/L (ref 0–44)
AST: 21 U/L (ref 15–41)
Albumin: 4.5 g/dL (ref 3.5–5.0)
Alkaline Phosphatase: 78 U/L (ref 38–126)
Anion gap: 6 (ref 5–15)
BUN: 20 mg/dL (ref 8–23)
CO2: 29 mmol/L (ref 22–32)
Calcium: 9.8 mg/dL (ref 8.9–10.3)
Chloride: 96 mmol/L — ABNORMAL LOW (ref 98–111)
Creatinine: 1.04 mg/dL (ref 0.61–1.24)
GFR, Estimated: >60 mL/min (ref >60)
Glucose, Bld: 153 mg/dL — ABNORMAL HIGH (ref 70–99)
Potassium: 3.8 mmol/L (ref 3.5–5.1)
Sodium: 131 mmol/L — ABNORMAL LOW (ref 135–145)
Total Bilirubin: 1 mg/dL (ref 0.0–1.2)
Total Protein: 7.5 g/dL (ref 6.5–8.1)

## 2024-01-23 LAB — CBC WITH DIFFERENTIAL (CANCER CENTER ONLY)
Abs Immature Granulocytes: 0.02 10*3/uL (ref 0.00–0.07)
Basophils Absolute: 0.1 10*3/uL (ref 0.0–0.1)
Basophils Relative: 1 %
Eosinophils Absolute: 0.1 10*3/uL (ref 0.0–0.5)
Eosinophils Relative: 1 %
HCT: 34.7 % — ABNORMAL LOW (ref 39.0–52.0)
Hemoglobin: 12.3 g/dL — ABNORMAL LOW (ref 13.0–17.0)
Immature Granulocytes: 0 %
Lymphocytes Relative: 13 %
Lymphs Abs: 0.9 10*3/uL (ref 0.7–4.0)
MCH: 34.8 pg — ABNORMAL HIGH (ref 26.0–34.0)
MCHC: 35.4 g/dL (ref 30.0–36.0)
MCV: 98.3 fL (ref 80.0–100.0)
Monocytes Absolute: 0.8 10*3/uL (ref 0.1–1.0)
Monocytes Relative: 12 %
Neutro Abs: 4.9 10*3/uL (ref 1.7–7.7)
Neutrophils Relative %: 73 %
Platelet Count: 226 10*3/uL (ref 150–400)
RBC: 3.53 MIL/uL — ABNORMAL LOW (ref 4.22–5.81)
RDW: 14.4 % (ref 11.5–15.5)
WBC Count: 6.7 10*3/uL (ref 4.0–10.5)
nRBC: 0 % (ref 0.0–0.2)

## 2024-01-23 LAB — TSH: TSH: 2.15 u[IU]/mL (ref 0.350–4.500)

## 2024-01-23 LAB — T4, FREE: Free T4: 1.07 ng/dL (ref 0.61–1.12)

## 2024-01-23 LAB — FOLATE: Folate: 10.4 ng/mL (ref >5.9)

## 2024-01-23 LAB — VITAMIN B12: Vitamin B-12: 858 pg/mL (ref 180–914)

## 2024-01-23 LAB — LACTATE DEHYDROGENASE: LDH: 130 U/L (ref 98–192)

## 2024-01-23 MED ORDER — SODIUM CHLORIDE 0.9 % IV SOLN: INTRAVENOUS | Status: DC

## 2024-01-23 MED ORDER — SODIUM CHLORIDE 0.9% FLUSH: 10.0000 mL | INTRAVENOUS | Status: DC | PRN

## 2024-01-23 MED ORDER — HEPARIN SOD (PORK) LOCK FLUSH 100 UNIT/ML IV SOLN: 500.0000 [IU] | Freq: Once | INTRAVENOUS | Status: DC | PRN

## 2024-01-23 MED ORDER — SODIUM CHLORIDE 0.9 % IV SOLN
240.0000 mg | Freq: Once | INTRAVENOUS | Status: AC
  Administered 2024-01-23: 240 mg via INTRAVENOUS
  Filled 2024-01-23: qty 24

## 2024-01-23 NOTE — Patient Instructions (Signed)
 [facility] - A DEPT OF [facility]  Discharge Instructions: Thank you for choosing [facility] to provide your oncology and hematology care.   If you have a lab appointment with the Cancer Center, please go directly to the Cancer Center and check in at the registration area.   Wear comfortable clothing and clothing appropriate for easy access to any Portacath or PICC line.   We strive to give you quality time with your provider. You may need to reschedule your appointment if you arrive late (15 or more minutes).  Arriving late affects you and other patients whose appointments are after yours.  Also, if you miss three or more appointments without notifying the office, you may be dismissed from the clinic at the provider's discretion.      For prescription refill requests, have your pharmacy contact our office and allow 72 hours for refills to be completed.    Today you received the following chemotherapy and/or immunotherapy agents: Opdivo      To help prevent nausea and vomiting after your treatment, we encourage you to take your nausea medication as directed.  BELOW ARE SYMPTOMS THAT SHOULD BE REPORTED IMMEDIATELY: *FEVER GREATER THAN 100.4 F (38 C) OR HIGHER *CHILLS OR SWEATING *NAUSEA AND VOMITING THAT IS NOT CONTROLLED WITH YOUR NAUSEA MEDICATION *UNUSUAL SHORTNESS OF BREATH *UNUSUAL BRUISING OR BLEEDING *URINARY PROBLEMS (pain or burning when urinating, or frequent urination) *BOWEL PROBLEMS (unusual diarrhea, constipation, pain near the anus) TENDERNESS IN MOUTH AND THROAT WITH OR WITHOUT PRESENCE OF ULCERS (sore throat, sores in mouth, or a toothache) UNUSUAL RASH, SWELLING OR PAIN  UNUSUAL VAGINAL DISCHARGE OR ITCHING   Items with * indicate a potential emergency and should be followed up as soon as possible or go to the Emergency Department if any problems should occur.  Please show the CHEMOTHERAPY ALERT CARD or IMMUNOTHERAPY  ALERT CARD at check-in to the Emergency Department and triage nurse.  Should you have questions after your visit or need to cancel or reschedule your appointment, please contact [facility]  Dept: [phone]  and follow the prompts.  Office hours are 8:00 a.m. to 4:30 p.m. Monday - Friday. Please note that voicemails left after 4:00 p.m. may not be returned until the following business day.  We are closed weekends and major holidays. You have access to a nurse at all times for urgent questions. Please call the main number to the clinic Dept: [phone] and follow the prompts.   For any non-urgent questions, you may also contact your provider using MyChart. We now offer e-Visits for anyone 57 and older to request care online for non-urgent symptoms. For details visit [email or web address].   Also download the MyChart app! Go to the app store, search "MyChart", open the app, select , and log in with your MyChart username and password.

## 2024-01-23 NOTE — Assessment & Plan Note (Addendum)
 Mild anemia. B12 500 mcg daily. Follow up folate and B12 today. Previously low-normal folate

## 2024-01-29 ENCOUNTER — Other Ambulatory Visit: Payer: Self-pay | Admitting: Internal Medicine

## 2024-02-06 ENCOUNTER — Inpatient Hospital Stay

## 2024-02-06 VITALS — BP 122/80 | HR 64 | Temp 97.8°F | Resp 16

## 2024-02-06 VITALS — BP 147/60 | HR 61 | Temp 97.7°F | Resp 18 | Wt 181.9 lb

## 2024-02-06 DIAGNOSIS — C438 Malignant melanoma of overlapping sites of skin: Secondary | ICD-10-CM

## 2024-02-06 DIAGNOSIS — Z9189 Other specified personal risk factors, not elsewhere classified: Secondary | ICD-10-CM | POA: Diagnosis not present

## 2024-02-06 DIAGNOSIS — Z5112 Encounter for antineoplastic immunotherapy: Secondary | ICD-10-CM | POA: Insufficient documentation

## 2024-02-06 DIAGNOSIS — Z79899 Other long term (current) drug therapy: Secondary | ICD-10-CM | POA: Insufficient documentation

## 2024-02-06 DIAGNOSIS — M2560 Stiffness of unspecified joint, not elsewhere classified: Secondary | ICD-10-CM | POA: Insufficient documentation

## 2024-02-06 DIAGNOSIS — C434 Malignant melanoma of scalp and neck: Secondary | ICD-10-CM | POA: Insufficient documentation

## 2024-02-06 DIAGNOSIS — C7800 Secondary malignant neoplasm of unspecified lung: Secondary | ICD-10-CM | POA: Insufficient documentation

## 2024-02-06 DIAGNOSIS — C7931 Secondary malignant neoplasm of brain: Secondary | ICD-10-CM | POA: Diagnosis not present

## 2024-02-06 LAB — CBC WITH DIFFERENTIAL (CANCER CENTER ONLY)
Abs Immature Granulocytes: 0.02 10*3/uL (ref 0.00–0.07)
Basophils Absolute: 0 10*3/uL (ref 0.0–0.1)
Basophils Relative: 1 %
Eosinophils Absolute: 0.1 10*3/uL (ref 0.0–0.5)
Eosinophils Relative: 1 %
HCT: 34.6 % — ABNORMAL LOW (ref 39.0–52.0)
Hemoglobin: 12.3 g/dL — ABNORMAL LOW (ref 13.0–17.0)
Immature Granulocytes: 0 %
Lymphocytes Relative: 12 %
Lymphs Abs: 0.8 10*3/uL (ref 0.7–4.0)
MCH: 34.5 pg — ABNORMAL HIGH (ref 26.0–34.0)
MCHC: 35.5 g/dL (ref 30.0–36.0)
MCV: 96.9 fL (ref 80.0–100.0)
Monocytes Absolute: 0.7 10*3/uL (ref 0.1–1.0)
Monocytes Relative: 10 %
Neutro Abs: 5.1 10*3/uL (ref 1.7–7.7)
Neutrophils Relative %: 76 %
Platelet Count: 233 10*3/uL (ref 150–400)
RBC: 3.57 MIL/uL — ABNORMAL LOW (ref 4.22–5.81)
RDW: 13.4 % (ref 11.5–15.5)
WBC Count: 6.6 10*3/uL (ref 4.0–10.5)
nRBC: 0 % (ref 0.0–0.2)

## 2024-02-06 LAB — LACTATE DEHYDROGENASE: LDH: 123 U/L (ref 98–192)

## 2024-02-06 LAB — CMP (CANCER CENTER ONLY)
ALT: 12 U/L (ref 0–44)
AST: 19 U/L (ref 15–41)
Albumin: 4.4 g/dL (ref 3.5–5.0)
Alkaline Phosphatase: 81 U/L (ref 38–126)
Anion gap: 8 (ref 5–15)
BUN: 18 mg/dL (ref 8–23)
CO2: 27 mmol/L (ref 22–32)
Calcium: 9.5 mg/dL (ref 8.9–10.3)
Chloride: 94 mmol/L — ABNORMAL LOW (ref 98–111)
Creatinine: 1.16 mg/dL (ref 0.61–1.24)
GFR, Estimated: >60 mL/min (ref >60)
Glucose, Bld: 210 mg/dL — ABNORMAL HIGH (ref 70–99)
Potassium: 3.9 mmol/L (ref 3.5–5.1)
Sodium: 129 mmol/L — ABNORMAL LOW (ref 135–145)
Total Bilirubin: 1 mg/dL (ref 0.0–1.2)
Total Protein: 7.4 g/dL (ref 6.5–8.1)

## 2024-02-06 LAB — T4, FREE: Free T4: 1.11 ng/dL (ref 0.61–1.12)

## 2024-02-06 LAB — TSH: TSH: 2.21 u[IU]/mL (ref 0.350–4.500)

## 2024-02-06 MED ORDER — SODIUM CHLORIDE 0.9 % IV SOLN
240.0000 mg | Freq: Once | INTRAVENOUS | Status: AC
  Administered 2024-02-06: 240 mg via INTRAVENOUS
  Filled 2024-02-06: qty 24

## 2024-02-06 MED ORDER — SODIUM CHLORIDE 0.9 % IV SOLN: INTRAVENOUS | Status: DC

## 2024-02-06 NOTE — Assessment & Plan Note (Addendum)
 No swelling or redness Continue monitor Report if worsening

## 2024-02-06 NOTE — Assessment & Plan Note (Addendum)
 Monitor with CBC, CMP each visit and H&P for immune related toxicity.  Previously with diarrhea. Resolved. Joint stiffness, mild

## 2024-02-06 NOTE — Progress Notes (Signed)
 [facility] OFFICE PROGRESS NOTE  Patient Care Team: Rodney Clamp, MD as PCP - General (Family Medicine) Verona Goodwill, MD as PCP - Electrophysiology (Cardiology) Verona Goodwill, MD (Cardiology) Trent Frizzle, MD as Attending Physician (Urology) Harlen Lick, MD as Consulting Physician (Dermatology) Alvina Axon, MD as Consulting Physician (Ophthalmology) Tobin Forts, MD as Consulting Physician (Gastroenterology) Rolando Cliche, [facility] as Pharmacist (Pharmacist) Karren Paddock, RN as [facility] Care Management  83 y.o.man undergoing nivolumab  for stage IV melanoma being follow up for metastatic melanoma on treatment. Recently with immune related side effects of diarrhea.   Current Diagnosis: Metastatic melanoma with brain and lung metastases.  Suspicious for bone metastases. BRAF negative. 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  08/19/23 SRS to 4 brain mets   PET from 3/20 showed excellent response after 6 doses of nivo.    Clinically well after rechallenge off steroid. Diarrhea without recurrence. Will continue treatment today.    Continue to monitor lab and other potential toxicities.   Cycle 10 today. Assessment & Plan Malignant melanoma of scalp (HCC) Excellent response Continue nivolumab  today Monitor for irAE Repeat labs and physical exam before each treatment. Repeat staging after C12 in ~mid July Stiffness in joint No swelling or redness Continue monitor Report if worsening At risk for side effect of medication Monitor with CBC, CMP each visit and H&P for immune related toxicity.  Previously with diarrhea. Resolved. Joint stiffness, mild     Nicholas Ruddy, MD  INTERVAL HISTORY: Patient returns for follow-up. Joint stiffness in general, no swelling. Not much pain. He took tylenol. No fever.  No rash, coughing, diarrhea, stomach. No other symptoms.  Oncology History  Malignant  melanoma (HCC)  01/01/2015 Initial Diagnosis   Malignant melanoma (HCC)   12/2014 Surgery   April 2016 WLE and SLN 01/31/2015: Reexcision    02/25/2018 Imaging   CT CAP Subcentimeter right upper lobe subpleural and left upper lobe perifissural nodules, new from 01/24/2016, indeterminate. Recommend short interval follow-up (3 months), given histor    04/07/2019 Imaging   CT Chest: No evidence of metastatic disease within the chest. Left upper lobe perifissural nodule no longer evident. Right upper lobe subpleural nodule no longer evident.   CT AP No evidence of metastatic disease.     04/2020 Pathology Results   Summer 2021: New scalp lesion, in transit  path: -Malignant melanoma, involving dermal tissue    04/12/2020 Imaging   CT chest: NED CT AP: NED    05/04/2020 Imaging   CT Neck: No mass or lymphadenopathy within the neck.    05/25/2020 - 07/25/2020 Chemotherapy   5 cycles of TVEC   01/24/2021 PET scan   PET whole body - No abnormal foci of increased radiotracer uptake to suggest metastatic disease.  -Small focus of increased uptake within the proximal sigmoid colon without adjacent stranding or definite wall thickening, indeterminant. Recommend further evaluation with colonoscopy if clinically    08/24/2021 PET scan   PET whole body - No abnormal foci of increased radiotracer uptake to suggest metastatic disease.   - Small focus of increased uptake within the proximal sigmoid colon, similar to 01/24/2021. Recommend clinical correlation and/or further evaluation with colonoscopy if clinically indicated    01/31/2022 Surgery   (Outside case, [phone], 1 slide, 01/23/2022) Skin, left inferior lateral anterior scalp, shave - Metastatic malignant melanoma Size: 2 mm Pattern: Pigmented epithelioid cells forming small plaque in superficial dermis and showing epidermotropism Tumor  infiltrating lymphocytes: Brisk Nuclear grade: 3/3 Margins: Focally present adjacent to edge of  tissue sections    02/21/2022 - 05/09/2022 Chemotherapy   had an intransit recurrence of melanoma and was retreated with TVEC.  Biopsy in October 2023 showed NED.    05/09/2022 PET scan   - Mildly avid 3 mm right upper lobe subcentimeter pulmonary nodule, possibly increased from 2 mm on prior; recommend attention on follow-up.   -No definite evidence of metabolically active disease.    05/28/2022 Pathology Results   Diagnosis   Skin, Left temple, excision - Dermal fibrosis with lymphocytic and granulomatous inflammation consistent with site of recent therapy - No residual melanoma identified - Margins free of tumor      02/15/2023 PET scan   - Focal area of hypermetabolic activity is seen in the ascending colon, indeterminate. Recommend correlation with colonoscopy.   -Unchanged size of mildly hypermetabolic left paratracheal lymph node, indeterminate. Recommend attention on follow-up imaging.   -Pancreatic calcifications likely reflect sequela of chronic pancreatitis.   -Bladder diverticuli and enlarged prostate.    05/13/2023 PET scan   1.  Evidence of progressive disease as demonstrated by interval enlargement of right upper lobe pulmonary nodule most concerning for metastasis by melanoma. Additional single right pulmonary nodule and 2 sites of focal osseous avidity which are suspicious but indeterminate for additional sites of involvement by metastasis involving the T8 and L1 vertebral bodies.  2.  Focal uptake within the sigmoid colon which has increased in conspicuity over time. Recommend correlation with colonoscopy if not recently completed with differential diagnosis including adenoma, carcinoma, and inflammatory lesion (given associated coarse calcifications/high density material). No evidence of complication (i.e.  fluid collection, or free air) by low-dose unenhanced CT.  3.  Persistent FDG abnormality of the left anterior shin associated with a dermal nodule. Recommend direct  inspection as additional site of cutaneous neoplasm is not excluded.    06/14/2023 Pathology Results   A: Lung, right upper lobe, lobectomy  - Metastatic melanoma, multifocal (largest focus 2.4 cm, with two sub-centimeter nodules also identified microscopically, each 3 mm) - Surgical margins negative  - BRAF (VE1): Negative - TILs: brisk - Two lymph nodes, negative for malignancy (0/2)   07/17/2023 Genetic Testing   [facility]: pertinent negatives: BRAF. KIT TERT C.-124C>T variant- promoter mutation NRAS p.G13D Missense variant (exon 2) - GOF TP53 p.R342P ARID1A p.G2107fs NF1 p.R1362* PHLPP2 p.Q300* NF1 p.K191_R192delinsN*  Multiple VUS findings.   08/05/2023 Imaging   MRI brain  --Single enhancing right occipital cortical lesion is most concerning for intracranial metastatic disease.   --Indeterminate 7 mm enhancing lesion in the right posterior parietal bone. There is no definite FDG avidity in this region on the recent PET/CT, arguing against malignancy, however close attention to this area on follow-up imaging is recommended.    08/05/2023 Imaging   CT chest IMPRESSION:   -Right upper lobectomy with moderate sized loculated right pleural effusion. New 0.3 cm right lower lobe micronodule. Recommend attention on follow-up CT in 3 months.   Few subcentimeter right infra-axillary lymph nodes with adjacent fat stranding and benign morphology, new since prior. Recommendation follow-up CT.   Markedly enlarged left atrium and mild qualitative dilatation of right atrium. Extensive coronary artery calcifications.    09/06/2023 -  Chemotherapy   Patient is on Treatment Plan : METASTATIC MELANOMA Nivolumab  (240) q14d     09/19/2023 Cancer Staging   Staging form: Melanoma of the Skin, AJCC 7th Edition - Clinical: Stage IV (TX, N0,  M1c) - Signed by Nicholas Ruddy, MD on 09/19/2023 Biopsy of metastatic site performed: Yes Source of metastatic specimen: Lung   11/19/2023 Imaging   MRI brain:  Positive treatment response, previously seen brain lesions are no longer detectable. No new lesion.   11/21/2023 PET scan   PET: after 6C of nivo every 2 weeks. Marked response to therapy, including resolution of hepatic and osseous hypermetabolism.      PHYSICAL EXAMINATION: ECOG PERFORMANCE STATUS: 1 - Symptomatic but completely ambulatory  Vitals:   02/06/24 1328  BP: (!) 147/60  Pulse: 61  Resp: 18  Temp: 97.7 F (36.5 C)  SpO2: 99%   Filed Weights   02/06/24 1328  Weight: 181 lb 14.4 oz (82.5 kg)    GENERAL: alert, no distress and comfortable SKIN: skin color normal and no bruising or petechiae or jaundice on exposed skin EYES: normal, sclera clear LYMPH:  no palpable cervical lymphadenopathy  LUNGS: clear to auscultation and percussion with normal breathing effort HEART: regular rate & rhythm  ABDOMEN: abdomen soft, non-tender and nondistended. Musculoskeletal: no edema, no joint swelling or redness   Relevant data reviewed during this visit included labs

## 2024-02-06 NOTE — Assessment & Plan Note (Addendum)
 Excellent response Continue nivolumab  today Monitor for irAE Repeat labs and physical exam before each treatment. Repeat staging after C12 in mid July

## 2024-02-06 NOTE — Patient Instructions (Signed)
 [facility] - A DEPT OF . [facility]  Discharge Instructions: Thank you for choosing [facility] to provide your oncology and hematology care.   If you have a lab appointment with the Cancer Center, please go directly to the Cancer Center and check in at the registration area.   Wear comfortable clothing and clothing appropriate for easy access to any Portacath or PICC line.   We strive to give you quality time with your provider. You may need to reschedule your appointment if you arrive late (15 or more minutes).  Arriving late affects you and other patients whose appointments are after yours.  Also, if you miss three or more appointments without notifying the office, you may be dismissed from the clinic at the provider's discretion.      For prescription refill requests, have your pharmacy contact our office and allow 72 hours for refills to be completed.    Today you received the following chemotherapy and/or immunotherapy agent: Durvalumab (Opdivo )   To help prevent nausea and vomiting after your treatment, we encourage you to take your nausea medication as directed.  BELOW ARE SYMPTOMS THAT SHOULD BE REPORTED IMMEDIATELY: *FEVER GREATER THAN 100.4 F (38 C) OR HIGHER *CHILLS OR SWEATING *NAUSEA AND VOMITING THAT IS NOT CONTROLLED WITH YOUR NAUSEA MEDICATION *UNUSUAL SHORTNESS OF BREATH *UNUSUAL BRUISING OR BLEEDING *URINARY PROBLEMS (pain or burning when urinating, or frequent urination) *BOWEL PROBLEMS (unusual diarrhea, constipation, pain near the anus) TENDERNESS IN MOUTH AND THROAT WITH OR WITHOUT PRESENCE OF ULCERS (sore throat, sores in mouth, or a toothache) UNUSUAL RASH, SWELLING OR PAIN  UNUSUAL VAGINAL DISCHARGE OR ITCHING   Items with * indicate a potential emergency and should be followed up as soon as possible or go to the Emergency Department if any problems should occur.  Please show the CHEMOTHERAPY ALERT CARD or  IMMUNOTHERAPY ALERT CARD at check-in to the Emergency Department and triage nurse.  Should you have questions after your visit or need to cancel or reschedule your appointment, please contact [facility] - A DEPT OF [facility]  Dept: [phone]  and follow the prompts.  Office hours are 8:00 a.m. to 4:30 p.m. Monday - Friday. Please note that voicemails left after 4:00 p.m. may not be returned until the following business day.  We are closed weekends and major holidays. You have access to a nurse at all times for urgent questions. Please call the main number to the clinic Dept: [phone] and follow the prompts.   For any non-urgent questions, you may also contact your provider using MyChart. We now offer e-Visits for anyone 42 and older to request care online for non-urgent symptoms. For details visit [email or web address].   Also download the MyChart app! Go to the app store, search "MyChart", open the app, select [place], and log in with your MyChart username and password.

## 2024-02-11 DIAGNOSIS — Z85820 Personal history of malignant melanoma of skin: Secondary | ICD-10-CM | POA: Diagnosis not present

## 2024-02-11 DIAGNOSIS — L570 Actinic keratosis: Secondary | ICD-10-CM | POA: Diagnosis not present

## 2024-02-11 DIAGNOSIS — D0439 Carcinoma in situ of skin of other parts of face: Secondary | ICD-10-CM | POA: Diagnosis not present

## 2024-02-11 DIAGNOSIS — D485 Neoplasm of uncertain behavior of skin: Secondary | ICD-10-CM | POA: Diagnosis not present

## 2024-02-18 NOTE — Progress Notes (Signed)
 Telephone nursing appointment for review of most recent MRI-Brain results. I verified patient's identity x2 and began nursing interview.   Patient states issues as follows...   -Fatigue: Mild -Hair Loss: Denies -Skin: Denies -Weakness: Denies -Loss of control of extremities: Denies -Headache: Denies -Seizure/ uncontrolled movement: Denies -Vision: Denies -Speech: Denies -Confusion: Denies -Dexamethasone / steroids: Denies   Patient denies any other related issues at this time.   Meaningful use complete.   Patient aware of their telephone appointment w/ Ashlyn Bruning PA-C. I left my extension [phone] in case patient needs anything. Patient verbalized understanding. This concludes the nursing interview.   Patient preferred phone # [phone]   Rosaline Minerva, LPN

## 2024-02-19 ENCOUNTER — Ambulatory Visit
Admission: RE | Admit: 2024-02-19 | Discharge: 2024-02-19 | Disposition: A | Discharge status: Home / Self Care | Source: Ambulatory Visit | Attending: Radiation Oncology | Admitting: Radiation Oncology

## 2024-02-19 DIAGNOSIS — C7931 Secondary malignant neoplasm of brain: Secondary | ICD-10-CM | POA: Diagnosis not present

## 2024-02-19 DIAGNOSIS — C801 Malignant (primary) neoplasm, unspecified: Secondary | ICD-10-CM | POA: Diagnosis not present

## 2024-02-19 MED ORDER — GADOPICLENOL 0.5 MMOL/ML IV SOLN
8.0000 mL | Freq: Once | INTRAVENOUS | Status: AC | PRN
  Administered 2024-02-19: 8 mL via INTRAVENOUS

## 2024-02-19 NOTE — Progress Notes (Unsigned)
 [facility] OFFICE PROGRESS NOTE  Patient Care Team: Rodney Clamp, MD as PCP - General (Family Medicine) Verona Goodwill, MD as PCP - Electrophysiology (Cardiology) Verona Goodwill, MD (Cardiology) Trent Frizzle, MD as Attending Physician (Urology) Harlen Lick, MD as Consulting Physician (Dermatology) Alvina Axon, MD as Consulting Physician (Ophthalmology) Tobin Forts, MD as Consulting Physician (Gastroenterology) Rolando Cliche, [facility] as Pharmacist (Pharmacist) Karren Paddock, RN as [facility] Care Management  Assessment & Plan   No orders of the defined types were placed in this encounter.    Lowanda Ruddy, MD  INTERVAL HISTORY: Patient returns for follow-up.  Oncology History  Malignant melanoma (HCC)  01/01/2015 Initial Diagnosis   Malignant melanoma (HCC)   12/2014 Surgery   April 2016 WLE and SLN 01/31/2015: Reexcision    02/25/2018 Imaging   CT CAP Subcentimeter right upper lobe subpleural and left upper lobe perifissural nodules, new from 01/24/2016, indeterminate. Recommend short interval follow-up (3 months), given histor    04/07/2019 Imaging   CT Chest: No evidence of metastatic disease within the chest. Left upper lobe perifissural nodule no longer evident. Right upper lobe subpleural nodule no longer evident.   CT AP No evidence of metastatic disease.     04/2020 Pathology Results   Summer 2021: New scalp lesion, in transit  path: -Malignant melanoma, involving dermal tissue    04/12/2020 Imaging   CT chest: NED CT AP: NED    05/04/2020 Imaging   CT Neck: No mass or lymphadenopathy within the neck.    05/25/2020 - 07/25/2020 Chemotherapy   5 cycles of TVEC   01/24/2021 PET scan   PET whole body - No abnormal foci of increased radiotracer uptake to suggest metastatic disease.  -Small focus of increased uptake within the proximal sigmoid colon without adjacent stranding or definite wall thickening, indeterminant.  Recommend further evaluation with colonoscopy if clinically    08/24/2021 PET scan   PET whole body - No abnormal foci of increased radiotracer uptake to suggest metastatic disease.   - Small focus of increased uptake within the proximal sigmoid colon, similar to 01/24/2021. Recommend clinical correlation and/or further evaluation with colonoscopy if clinically indicated    01/31/2022 Surgery   (Outside case, [phone], 1 slide, 01/23/2022) Skin, left inferior lateral anterior scalp, shave - Metastatic malignant melanoma Size: 2 mm Pattern: Pigmented epithelioid cells forming small plaque in superficial dermis and showing epidermotropism Tumor infiltrating lymphocytes: Brisk Nuclear grade: 3/3 Margins: Focally present adjacent to edge of tissue sections    02/21/2022 - 05/09/2022 Chemotherapy   had an intransit recurrence of melanoma and was retreated with TVEC.  Biopsy in October 2023 showed NED.    05/09/2022 PET scan   - Mildly avid 3 mm right upper lobe subcentimeter pulmonary nodule, possibly increased from 2 mm on prior; recommend attention on follow-up.   -No definite evidence of metabolically active disease.    05/28/2022 Pathology Results   Diagnosis   Skin, Left temple, excision - Dermal fibrosis with lymphocytic and granulomatous inflammation consistent with site of recent therapy - No residual melanoma identified - Margins free of tumor      02/15/2023 PET scan   - Focal area of hypermetabolic activity is seen in the ascending colon, indeterminate. Recommend correlation with colonoscopy.   -Unchanged size of mildly hypermetabolic left paratracheal lymph node, indeterminate. Recommend attention on follow-up imaging.   -Pancreatic calcifications likely reflect sequela of chronic pancreatitis.   -Bladder diverticuli and enlarged prostate.  05/13/2023 PET scan   1.  Evidence of progressive disease as demonstrated by interval enlargement of right upper lobe pulmonary  nodule most concerning for metastasis by melanoma. Additional single right pulmonary nodule and 2 sites of focal osseous avidity which are suspicious but indeterminate for additional sites of involvement by metastasis involving the T8 and L1 vertebral bodies.  2.  Focal uptake within the sigmoid colon which has increased in conspicuity over time. Recommend correlation with colonoscopy if not recently completed with differential diagnosis including adenoma, carcinoma, and inflammatory lesion (given associated coarse calcifications/high density material). No evidence of complication (i.e.  fluid collection, or free air) by low-dose unenhanced CT.  3.  Persistent FDG abnormality of the left anterior shin associated with a dermal nodule. Recommend direct inspection as additional site of cutaneous neoplasm is not excluded.    06/14/2023 Pathology Results   A: Lung, right upper lobe, lobectomy  - Metastatic melanoma, multifocal (largest focus 2.4 cm, with two sub-centimeter nodules also identified microscopically, each 3 mm) - Surgical margins negative  - BRAF (VE1): Negative - TILs: brisk - Two lymph nodes, negative for malignancy (0/2)   07/17/2023 Genetic Testing   [facility]: pertinent negatives: BRAF. KIT TERT C.-124C>T variant- promoter mutation NRAS p.G13D Missense variant (exon 2) - GOF TP53 p.R342P ARID1A p.G236fs NF1 p.R1362* PHLPP2 p.Q300* NF1 p.K191_R192delinsN*  Multiple VUS findings.   08/05/2023 Imaging   MRI brain  --Single enhancing right occipital cortical lesion is most concerning for intracranial metastatic disease.   --Indeterminate 7 mm enhancing lesion in the right posterior parietal bone. There is no definite FDG avidity in this region on the recent PET/CT, arguing against malignancy, however close attention to this area on follow-up imaging is recommended.    08/05/2023 Imaging   CT chest IMPRESSION:   -Right upper lobectomy with moderate sized loculated right pleural  effusion. New 0.3 cm right lower lobe micronodule. Recommend attention on follow-up CT in 3 months.   Few subcentimeter right infra-axillary lymph nodes with adjacent fat stranding and benign morphology, new since prior. Recommendation follow-up CT.   Markedly enlarged left atrium and mild qualitative dilatation of right atrium. Extensive coronary artery calcifications.    09/06/2023 -  Chemotherapy   Patient is on Treatment Plan : METASTATIC MELANOMA Nivolumab  (240) q14d     09/19/2023 Cancer Staging   Staging form: Melanoma of the Skin, AJCC 7th Edition - Clinical: Stage IV (TX, N0, M1c) - Signed by Lowanda Ruddy, MD on 09/19/2023 Biopsy of metastatic site performed: Yes Source of metastatic specimen: Lung   11/19/2023 Imaging   MRI brain: Positive treatment response, previously seen brain lesions are no longer detectable. No new lesion.   11/21/2023 PET scan   PET: after 6C of nivo every 2 weeks. Marked response to therapy, including resolution of hepatic and osseous hypermetabolism.      PHYSICAL EXAMINATION: ECOG PERFORMANCE STATUS: {CHL ONC ECOG [phone]}  There were no vitals filed for this visit. There were no vitals filed for this visit.  GENERAL: alert, no distress and comfortable SKIN: skin color normal and no bruising or petechiae or jaundice on exposed skin EYES: normal, sclera clear OROPHARYNX: no exudate  NECK: No palpable mass LYMPH:  no palpable cervical, axillary lymphadenopathy  LUNGS: clear to auscultation and percussion with normal breathing effort HEART: regular rate & rhythm  ABDOMEN: abdomen soft, non-tender and nondistended. Musculoskeletal: no edema NEURO: no focal motor/sensory deficits  Relevant data reviewed during this visit included ***

## 2024-02-19 NOTE — Assessment & Plan Note (Addendum)
 NED on new MRI

## 2024-02-19 NOTE — Assessment & Plan Note (Addendum)
 Excellent response Continue nivolumab  today Monitor for irAE Repeat labs and physical exam before each treatment. Repeat staging after C12 in ~mid to late July

## 2024-02-19 NOTE — Assessment & Plan Note (Signed)
 Monitor with CBC, CMP each visit and H&P for immune related toxicity.  Previously with diarrhea. Resolved. Joint stiffness, mild

## 2024-02-20 ENCOUNTER — Inpatient Hospital Stay

## 2024-02-20 ENCOUNTER — Ambulatory Visit

## 2024-02-20 ENCOUNTER — Ambulatory Visit: Payer: Self-pay | Admitting: Radiation Oncology

## 2024-02-20 VITALS — BP 117/62 | HR 82 | Temp 98.4°F | Resp 18 | Ht 70.5 in | Wt 180.1 lb

## 2024-02-20 DIAGNOSIS — Z9189 Other specified personal risk factors, not elsewhere classified: Secondary | ICD-10-CM | POA: Diagnosis not present

## 2024-02-20 DIAGNOSIS — C7931 Secondary malignant neoplasm of brain: Secondary | ICD-10-CM | POA: Diagnosis not present

## 2024-02-20 DIAGNOSIS — C438 Malignant melanoma of overlapping sites of skin: Secondary | ICD-10-CM

## 2024-02-20 DIAGNOSIS — Z5112 Encounter for antineoplastic immunotherapy: Secondary | ICD-10-CM | POA: Diagnosis not present

## 2024-02-20 DIAGNOSIS — C7800 Secondary malignant neoplasm of unspecified lung: Secondary | ICD-10-CM | POA: Diagnosis not present

## 2024-02-20 DIAGNOSIS — C434 Malignant melanoma of scalp and neck: Secondary | ICD-10-CM

## 2024-02-20 DIAGNOSIS — Z79899 Other long term (current) drug therapy: Secondary | ICD-10-CM | POA: Diagnosis not present

## 2024-02-20 LAB — CMP (CANCER CENTER ONLY)
ALT: 12 U/L (ref 0–44)
AST: 18 U/L (ref 15–41)
Albumin: 4.5 g/dL (ref 3.5–5.0)
Alkaline Phosphatase: 96 U/L (ref 38–126)
Anion gap: 9 (ref 5–15)
BUN: 19 mg/dL (ref 8–23)
CO2: 26 mmol/L (ref 22–32)
Calcium: 9.9 mg/dL (ref 8.9–10.3)
Chloride: 95 mmol/L — ABNORMAL LOW (ref 98–111)
Creatinine: 1.04 mg/dL (ref 0.61–1.24)
GFR, Estimated: >60 mL/min (ref >60)
Glucose, Bld: 181 mg/dL — ABNORMAL HIGH (ref 70–99)
Potassium: 4 mmol/L (ref 3.5–5.1)
Sodium: 130 mmol/L — ABNORMAL LOW (ref 135–145)
Total Bilirubin: 0.9 mg/dL (ref 0.0–1.2)
Total Protein: 7.5 g/dL (ref 6.5–8.1)

## 2024-02-20 LAB — CBC WITH DIFFERENTIAL (CANCER CENTER ONLY)
Abs Immature Granulocytes: 0.04 10*3/uL (ref 0.00–0.07)
Basophils Absolute: 0 10*3/uL (ref 0.0–0.1)
Basophils Relative: 1 %
Eosinophils Absolute: 0.1 10*3/uL (ref 0.0–0.5)
Eosinophils Relative: 1 %
HCT: 36.9 % — ABNORMAL LOW (ref 39.0–52.0)
Hemoglobin: 12.8 g/dL — ABNORMAL LOW (ref 13.0–17.0)
Immature Granulocytes: 1 %
Lymphocytes Relative: 12 %
Lymphs Abs: 0.9 10*3/uL (ref 0.7–4.0)
MCH: 33.7 pg (ref 26.0–34.0)
MCHC: 34.7 g/dL (ref 30.0–36.0)
MCV: 97.1 fL (ref 80.0–100.0)
Monocytes Absolute: 0.7 10*3/uL (ref 0.1–1.0)
Monocytes Relative: 9 %
Neutro Abs: 6.1 10*3/uL (ref 1.7–7.7)
Neutrophils Relative %: 76 %
Platelet Count: 279 10*3/uL (ref 150–400)
RBC: 3.8 MIL/uL — ABNORMAL LOW (ref 4.22–5.81)
RDW: 13.3 % (ref 11.5–15.5)
WBC Count: 8 10*3/uL (ref 4.0–10.5)
nRBC: 0 % (ref 0.0–0.2)

## 2024-02-20 LAB — TSH: TSH: 2.01 u[IU]/mL (ref 0.350–4.500)

## 2024-02-20 LAB — LACTATE DEHYDROGENASE: LDH: 122 U/L (ref 98–192)

## 2024-02-20 LAB — T4, FREE: Free T4: 1.17 ng/dL — ABNORMAL HIGH (ref 0.61–1.12)

## 2024-02-20 MED ORDER — SODIUM CHLORIDE 0.9 % IV SOLN: INTRAVENOUS | Status: DC

## 2024-02-20 MED ORDER — SODIUM CHLORIDE 0.9 % IV SOLN
240.0000 mg | Freq: Once | INTRAVENOUS | Status: AC
  Administered 2024-02-20: 240 mg via INTRAVENOUS
  Filled 2024-02-20: qty 24

## 2024-02-20 NOTE — Patient Instructions (Signed)
 [facility] - A DEPT OF . [facility]  Discharge Instructions: Thank you for choosing [facility] to provide your oncology and hematology care.   If you have a lab appointment with the Cancer Center, please go directly to the Cancer Center and check in at the registration area.   Wear comfortable clothing and clothing appropriate for easy access to any Portacath or PICC line.   We strive to give you quality time with your provider. You may need to reschedule your appointment if you arrive late (15 or more minutes).  Arriving late affects you and other patients whose appointments are after yours.  Also, if you miss three or more appointments without notifying the office, you may be dismissed from the clinic at the provider's discretion.      For prescription refill requests, have your pharmacy contact our office and allow 72 hours for refills to be completed.    Today you received the following chemotherapy and/or immunotherapy agent: Durvalumab (Opdivo )   To help prevent nausea and vomiting after your treatment, we encourage you to take your nausea medication as directed.  BELOW ARE SYMPTOMS THAT SHOULD BE REPORTED IMMEDIATELY: *FEVER GREATER THAN 100.4 F (38 C) OR HIGHER *CHILLS OR SWEATING *NAUSEA AND VOMITING THAT IS NOT CONTROLLED WITH YOUR NAUSEA MEDICATION *UNUSUAL SHORTNESS OF BREATH *UNUSUAL BRUISING OR BLEEDING *URINARY PROBLEMS (pain or burning when urinating, or frequent urination) *BOWEL PROBLEMS (unusual diarrhea, constipation, pain near the anus) TENDERNESS IN MOUTH AND THROAT WITH OR WITHOUT PRESENCE OF ULCERS (sore throat, sores in mouth, or a toothache) UNUSUAL RASH, SWELLING OR PAIN  UNUSUAL VAGINAL DISCHARGE OR ITCHING   Items with * indicate a potential emergency and should be followed up as soon as possible or go to the Emergency Department if any problems should occur.  Please show the CHEMOTHERAPY ALERT CARD or  IMMUNOTHERAPY ALERT CARD at check-in to the Emergency Department and triage nurse.  Should you have questions after your visit or need to cancel or reschedule your appointment, please contact [facility] - A DEPT OF [facility]  Dept: [phone]  and follow the prompts.  Office hours are 8:00 a.m. to 4:30 p.m. Monday - Friday. Please note that voicemails left after 4:00 p.m. may not be returned until the following business day.  We are closed weekends and major holidays. You have access to a nurse at all times for urgent questions. Please call the main number to the clinic Dept: [phone] and follow the prompts.   For any non-urgent questions, you may also contact your provider using MyChart. We now offer e-Visits for anyone 42 and older to request care online for non-urgent symptoms. For details visit [email or web address].   Also download the MyChart app! Go to the app store, search "MyChart", open the app, select [place], and log in with your MyChart username and password.

## 2024-02-24 ENCOUNTER — Encounter

## 2024-02-26 ENCOUNTER — Encounter: Payer: Self-pay | Admitting: Urology

## 2024-02-26 ENCOUNTER — Other Ambulatory Visit: Payer: Self-pay | Admitting: Radiation Therapy

## 2024-02-26 ENCOUNTER — Ambulatory Visit
Admission: RE | Admit: 2024-02-26 | Discharge: 2024-02-26 | Disposition: A | Discharge status: Home / Self Care | Source: Ambulatory Visit | Attending: Urology | Admitting: Urology

## 2024-02-26 VITALS — Ht 71.0 in | Wt 180.0 lb

## 2024-02-26 DIAGNOSIS — C7931 Secondary malignant neoplasm of brain: Secondary | ICD-10-CM

## 2024-02-26 NOTE — Progress Notes (Signed)
 Radiation Oncology         [phone] ________________________________  Name: Nicholas Bird MRN: [id number]  Date: 02/26/2024  DOB: 10-13-1940  Post Treatment Note  CC: Nicholas Worth HERO, MD  Nicholas Worth HERO, MD  Diagnosis:   83 year old man with brain metastases secondary to stage IV malignant melanoma   Interval Since Last Radiation:  6 months  08/19/23:  The 4 targets in the brain were treated to 20 Gy in a single fraction of SRS.  Narrative:  I spoke with the patient to conduct his routine scheduled 3 month follow up visit to review the results of his recent post-treatment MRI brain scan via telephone to spare the patient unnecessary potential exposure in the healthcare setting during the current COVID-19 pandemic.  The patient was notified in advance and gave permission to proceed with this visit format.  He tolerated the treatment well and was discharged home in stable condition.  He has continued without complaints. His initial post-treatment MRI brain scan from 02/19/24 continues to show an excellent treatment response with resolution of the previously treated lesions and no evidence of new lesions. We reviewed the results today by telephone.  His restaging PET scan from 11/21/23 also confirmed an excellent treatment response to his systemic immunotherapy with resolution of the hypermetabolic activity in the liver and bone lesions.  He remains on nivolumab  immunotherapy, started 09/06/2023, under the care of Dr. Tina and has a follow-up visit scheduled with Dr. Tina on 03/05/2024, prior to cycle 12.                          On review of systems, the patient states that he is doing very well in general and is without complaints.  He specifically denies headaches, dizziness, N/V, imbalance or difficulty with speech.  He has not noted any focal weakness and overall, remains pleased with his progress to date.  ALLERGIES:  is allergic to pertussis vaccines.  Meds: Current Outpatient  Medications  Medication Sig Dispense Refill   allopurinol  (ZYLOPRIM ) 300 MG tablet TAKE 1 TABLET BY MOUTH EVERY DAY 90 tablet 0   amLODipine  (NORVASC ) 5 MG tablet TAKE 1 TABLET (5 MG TOTAL) BY MOUTH DAILY. 30 tablet 0   ampicillin (PRINCIPEN) 500 MG capsule Take 500 mg by mouth 2 (two) times daily as needed. (Patient not taking: Reported on 01/06/2024)     apixaban  (ELIQUIS ) 5 MG TABS tablet TAKE 1 TABLET BY MOUTH TWICE A DAY 60 tablet 0   ferrous sulfate 325 (65 FE) MG tablet Take 325 mg by mouth daily.     finasteride (PROSCAR) 5 MG tablet Take 5 mg by mouth.     metoprolol  succinate (TOPROL -XL) 50 MG 24 hr tablet Take 1 tablet (50 mg total) by mouth daily. 15 tablet 0   ondansetron  (ZOFRAN ) 8 MG tablet Take 1 tablet (8 mg total) by mouth every 8 (eight) hours as needed for nausea or vomiting. (Patient not taking: Reported on 01/06/2024) 30 tablet 1   prochlorperazine  (COMPAZINE ) 10 MG tablet Take 1 tablet (10 mg total) by mouth every 6 (six) hours as needed for nausea or vomiting. (Patient not taking: Reported on 01/06/2024) 30 tablet 1   tamsulosin  (FLOMAX ) 0.4 MG CAPS capsule Take 1 capsule (0.4 mg total) by mouth daily. 30 capsule 0   valsartan  (DIOVAN ) 80 MG tablet TAKE 1 TABLET BY MOUTH EVERY DAY 60 tablet 0   No current facility-administered medications for this encounter.  Physical Findings:  height is 5' 11 (1.803 m) and weight is 180 lb (81.6 kg).  Pain Assessment Pain Score: 0-No pain/10 Unable to assess due to telephone follow-up visit format.  Lab Findings: Lab Results  Component Value Date   WBC 8.0 02/20/2024   HGB 12.8 (L) 02/20/2024   HCT 36.9 (L) 02/20/2024   MCV 97.1 02/20/2024   PLT 279 02/20/2024     Radiographic Findings: MR Brain W Wo Contrast Result Date: 02/19/2024 CLINICAL DATA:  Brain metastases, assess treatment response. History of metastatic melanoma. EXAM: MRI HEAD WITHOUT AND WITH CONTRAST TECHNIQUE: Multiplanar, multiecho pulse sequences of the  brain and surrounding structures were obtained without and with intravenous contrast. CONTRAST:  8 mL Vueway  COMPARISON:  Head MRI 11/19/2023 FINDINGS: Brain: The previously treated brain metastases remain no longer visible, and no new enhancing intracranial lesions are identified. No acute infarct, intracranial hemorrhage, midline shift, or extra-axial fluid collection is evident. Cerebral volume is normal for age. The ventricles are normal in size. T2 hyperintensities in the cerebral white matter bilaterally are unchanged and nonspecific but compatible with mild chronic small vessel ischemic disease. Vascular: Major intracranial vascular flow voids are preserved. Skull and upper cervical spine: No suspicious marrow lesion. Scalp scarring at the vertex. Advanced left facet arthrosis at C3-4 with trace anterolisthesis. Sinuses/Orbits: Bilateral cataract extraction. Mucous retention cyst in the left maxillary sinus. Clear mastoid air cells. Other: Unchanged varix involving the right facial vein. IMPRESSION: No evidence of new intracranial metastases. Electronically Signed   By: Dasie Hamburg M.D.   On: 02/19/2024 16:23    Impression/Plan: 19. 82 year old man with brain metastases secondary to stage IV malignant melanoma. He tolerated the [facility] treatment very well and remains without complaints.  We reviewed the results of his recent posttreatment MRI brain scan which continues to show an excellent treatment response with resolution of the treated lesions and no new lesions noted.  We discussed the plan to continue with surveillance MRI brain scans every 3 months to continue to monitor for any evidence of disease progression or recurrence.  I will plan to follow-up by telephone following each scan to review the results and recommendations.  He knows that he is welcome to call at anytime with any questions or concerns in the interim.  I personally spent 20 minutes in this encounter including chart review, reviewing  radiological studies, telephone conversation with the patient, entering orders and completing documentation.    Nicholas MICAEL Rusk, PA-C

## 2024-02-28 ENCOUNTER — Other Ambulatory Visit: Payer: Self-pay | Admitting: Internal Medicine

## 2024-02-28 NOTE — Telephone Encounter (Signed)
 Prescription refill request for Eliquis  received. Indication:afib Last office visit:needs appt Scr:na Age: 83 Weight:81.6  kg  Prescription refilled

## 2024-03-05 ENCOUNTER — Inpatient Hospital Stay

## 2024-03-05 VITALS — BP 115/53 | HR 60 | Temp 97.5°F | Resp 20 | Wt 177.8 lb

## 2024-03-05 DIAGNOSIS — Z5112 Encounter for antineoplastic immunotherapy: Secondary | ICD-10-CM | POA: Insufficient documentation

## 2024-03-05 DIAGNOSIS — E538 Deficiency of other specified B group vitamins: Secondary | ICD-10-CM | POA: Diagnosis not present

## 2024-03-05 DIAGNOSIS — Z9189 Other specified personal risk factors, not elsewhere classified: Secondary | ICD-10-CM | POA: Diagnosis not present

## 2024-03-05 DIAGNOSIS — C438 Malignant melanoma of overlapping sites of skin: Secondary | ICD-10-CM

## 2024-03-05 DIAGNOSIS — D649 Anemia, unspecified: Secondary | ICD-10-CM | POA: Insufficient documentation

## 2024-03-05 DIAGNOSIS — C434 Malignant melanoma of scalp and neck: Secondary | ICD-10-CM

## 2024-03-05 DIAGNOSIS — M2560 Stiffness of unspecified joint, not elsewhere classified: Secondary | ICD-10-CM | POA: Diagnosis not present

## 2024-03-05 DIAGNOSIS — C7931 Secondary malignant neoplasm of brain: Secondary | ICD-10-CM

## 2024-03-05 DIAGNOSIS — C7800 Secondary malignant neoplasm of unspecified lung: Secondary | ICD-10-CM | POA: Insufficient documentation

## 2024-03-05 LAB — CBC WITH DIFFERENTIAL (CANCER CENTER ONLY)
Abs Immature Granulocytes: 0.04 10*3/uL (ref 0.00–0.07)
Basophils Absolute: 0 10*3/uL (ref 0.0–0.1)
Basophils Relative: 0 %
Eosinophils Absolute: 0.1 10*3/uL (ref 0.0–0.5)
Eosinophils Relative: 1 %
HCT: 36.1 % — ABNORMAL LOW (ref 39.0–52.0)
Hemoglobin: 12.7 g/dL — ABNORMAL LOW (ref 13.0–17.0)
Immature Granulocytes: 1 %
Lymphocytes Relative: 10 %
Lymphs Abs: 0.8 10*3/uL (ref 0.7–4.0)
MCH: 33.9 pg (ref 26.0–34.0)
MCHC: 35.2 g/dL (ref 30.0–36.0)
MCV: 96.3 fL (ref 80.0–100.0)
Monocytes Absolute: 1 10*3/uL (ref 0.1–1.0)
Monocytes Relative: 12 %
Neutro Abs: 6.3 10*3/uL (ref 1.7–7.7)
Neutrophils Relative %: 76 %
Platelet Count: 254 10*3/uL (ref 150–400)
RBC: 3.75 MIL/uL — ABNORMAL LOW (ref 4.22–5.81)
RDW: 13.3 % (ref 11.5–15.5)
WBC Count: 8.2 10*3/uL (ref 4.0–10.5)
nRBC: 0 % (ref 0.0–0.2)

## 2024-03-05 LAB — CMP (CANCER CENTER ONLY)
ALT: 14 U/L (ref 0–44)
AST: 19 U/L (ref 15–41)
Albumin: 4.2 g/dL (ref 3.5–5.0)
Alkaline Phosphatase: 90 U/L (ref 38–126)
Anion gap: 8 (ref 5–15)
BUN: 24 mg/dL — ABNORMAL HIGH (ref 8–23)
CO2: 28 mmol/L (ref 22–32)
Calcium: 9.6 mg/dL (ref 8.9–10.3)
Chloride: 95 mmol/L — ABNORMAL LOW (ref 98–111)
Creatinine: 1.1 mg/dL (ref 0.61–1.24)
GFR, Estimated: >60 mL/min (ref >60)
Glucose, Bld: 194 mg/dL — ABNORMAL HIGH (ref 70–99)
Potassium: 4 mmol/L (ref 3.5–5.1)
Sodium: 131 mmol/L — ABNORMAL LOW (ref 135–145)
Total Bilirubin: 1 mg/dL (ref 0.0–1.2)
Total Protein: 6.8 g/dL (ref 6.5–8.1)

## 2024-03-05 LAB — T4, FREE: Free T4: 0.96 ng/dL (ref 0.61–1.12)

## 2024-03-05 LAB — TSH: TSH: 2.1 u[IU]/mL (ref 0.350–4.500)

## 2024-03-05 LAB — LACTATE DEHYDROGENASE: LDH: 116 U/L (ref 98–192)

## 2024-03-05 MED ORDER — SODIUM CHLORIDE 0.9 % IV SOLN: INTRAVENOUS | Status: DC

## 2024-03-05 MED ORDER — SODIUM CHLORIDE 0.9 % IV SOLN
240.0000 mg | Freq: Once | INTRAVENOUS | Status: AC
  Administered 2024-03-05: 240 mg via INTRAVENOUS
  Filled 2024-03-05: qty 24

## 2024-03-05 NOTE — Assessment & Plan Note (Addendum)
 Monitor with CBC, CMP each visit and H&P for immune related toxicity.  Previously with diarrhea. Resolved. Joint stiffness, mild

## 2024-03-05 NOTE — Assessment & Plan Note (Addendum)
 Mild anemia. Ok to continue B12 500 mcg daily. Previously low-normal folate as well. Both improved

## 2024-03-05 NOTE — Progress Notes (Signed)
 [facility] OFFICE PROGRESS NOTE  Patient Care Team: [name], MD as PCP - General (Family Medicine) [name], MD as PCP - Electrophysiology (Cardiology) [name], MD (Cardiology) [name], MD as Attending Physician (Urology) [name], MD as Consulting Physician (Dermatology) [name], MD as Consulting Physician (Ophthalmology) [name], MD as Consulting Physician (Gastroenterology) [name], [facility] as Pharmacist (Pharmacist) [name], RN as [facility] Care Management  83 y.o.man undergoing nivolumab  for stage IV melanoma being follow up for metastatic melanoma on treatment. Recently with immune related side effects of diarrhea.   Current Diagnosis: Metastatic melanoma with brain and lung metastases.  Suspicious for bone metastases. BRAF negative. 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 (Started 09/06/23) 08/19/23 SRS to 4 brain mets   Clinically well after rechallenge off steroid. Diarrhea without recurrence. Will continue treatment today.    Continue to monitor lab and other potential toxicities.   Cycle 12 today. PET scheduled before next visit. Assessment & Plan Malignant melanoma of scalp (HCC) Excellent response Continue nivolumab  today Monitor for irAE Repeat labs and physical exam before each treatment. Repeat staging after C12 in ~mid to late July Malignant melanoma metastatic to brain [facility]) NED on new MRI No new symptoms today. Exam normal. Low serum vitamin B12 Mild anemia. Ok to continue B12 500 mcg daily. Previously low-normal folate as well. Both improved Stiffness in joint No swelling or redness Continue monitor Report if worsening At risk for side effect of medication Monitor with CBC, CMP each visit and H&P for immune related toxicity.  Previously with diarrhea. Resolved. Joint stiffness, mild   Nicholas Bird [name], MD  INTERVAL  HISTORY: Patient returns for follow-up. No new symptoms. Some shoulder stiffness.  No new rash, diarrhea, coughing, chest pain, headache, visual change, focal weakness. No short of breath.  No new mass.   Oncology History  Malignant melanoma (HCC)  01/01/2015 Initial Diagnosis   Malignant melanoma (HCC)   12/2014 Surgery   April 2016 WLE and SLN 01/31/2015: Reexcision    02/25/2018 Imaging   CT CAP Subcentimeter right upper lobe subpleural and left upper lobe perifissural nodules, new from 01/24/2016, indeterminate. Recommend short interval follow-up (3 months), given histor    04/07/2019 Imaging   CT Chest: No evidence of metastatic disease within the chest. Left upper lobe perifissural nodule no longer evident. Right upper lobe subpleural nodule no longer evident.   CT AP No evidence of metastatic disease.     04/2020 Pathology Results   Summer 2021: New scalp lesion, in transit  path: -Malignant melanoma, involving dermal tissue    04/12/2020 Imaging   CT chest: NED CT AP: NED    05/04/2020 Imaging   CT Neck: No mass or lymphadenopathy within the neck.    05/25/2020 - 07/25/2020 Chemotherapy   5 cycles of TVEC   01/24/2021 PET scan   PET whole body - No abnormal foci of increased radiotracer uptake to suggest metastatic disease.  -Small focus of increased uptake within the proximal sigmoid colon without adjacent stranding or definite wall thickening, indeterminant. Recommend further evaluation with colonoscopy if clinically    08/24/2021 PET scan   PET whole body - No abnormal foci of increased radiotracer uptake to suggest metastatic disease.   - Small focus of increased uptake within the proximal sigmoid colon, similar to 01/24/2021. Recommend clinical correlation and/or further evaluation with colonoscopy if clinically indicated    01/31/2022 Surgery   (Outside  case, [phone], 1 slide, 01/23/2022) Skin, left inferior lateral anterior scalp, shave - Metastatic  malignant melanoma Size: 2 mm Pattern: Pigmented epithelioid cells forming small plaque in superficial dermis and showing epidermotropism Tumor infiltrating lymphocytes: Brisk Nuclear grade: 3/3 Margins: Focally present adjacent to edge of tissue sections    02/21/2022 - 05/09/2022 Chemotherapy   had an intransit recurrence of melanoma and was retreated with TVEC.  Biopsy in October 2023 showed NED.    05/09/2022 PET scan   - Mildly avid 3 mm right upper lobe subcentimeter pulmonary nodule, possibly increased from 2 mm on prior; recommend attention on follow-up.   -No definite evidence of metabolically active disease.    05/28/2022 Pathology Results   Diagnosis   Skin, Left temple, excision - Dermal fibrosis with lymphocytic and granulomatous inflammation consistent with site of recent therapy - No residual melanoma identified - Margins free of tumor      02/15/2023 PET scan   - Focal area of hypermetabolic activity is seen in the ascending colon, indeterminate. Recommend correlation with colonoscopy.   -Unchanged size of mildly hypermetabolic left paratracheal lymph node, indeterminate. Recommend attention on follow-up imaging.   -Pancreatic calcifications likely reflect sequela of chronic pancreatitis.   -Bladder diverticuli and enlarged prostate.    05/13/2023 PET scan   1.  Evidence of progressive disease as demonstrated by interval enlargement of right upper lobe pulmonary nodule most concerning for metastasis by melanoma. Additional single right pulmonary nodule and 2 sites of focal osseous avidity which are suspicious but indeterminate for additional sites of involvement by metastasis involving the T8 and L1 vertebral bodies.  2.  Focal uptake within the sigmoid colon which has increased in conspicuity over time. Recommend correlation with colonoscopy if not recently completed with differential diagnosis including adenoma, carcinoma, and inflammatory lesion (given associated coarse  calcifications/high density material). No evidence of complication (i.e.  fluid collection, or free air) by low-dose unenhanced CT.  3.  Persistent FDG abnormality of the left anterior shin associated with a dermal nodule. Recommend direct inspection as additional site of cutaneous neoplasm is not excluded.    06/14/2023 Pathology Results   A: Lung, right upper lobe, lobectomy  - Metastatic melanoma, multifocal (largest focus 2.4 cm, with two sub-centimeter nodules also identified microscopically, each 3 mm) - Surgical margins negative  - BRAF (VE1): Negative - TILs: brisk - Two lymph nodes, negative for malignancy (0/2)   07/17/2023 Genetic Testing   [facility]: pertinent negatives: BRAF. KIT TERT C.-124C>T variant- promoter mutation NRAS p.G13D Missense variant (exon 2) - GOF TP53 p.R342P ARID1A p.G285fs NF1 p.R1362* PHLPP2 p.Q300* NF1 p.K191_R192delinsN*  Multiple VUS findings.   08/05/2023 Imaging   MRI brain  --Single enhancing right occipital cortical lesion is most concerning for intracranial metastatic disease.   --Indeterminate 7 mm enhancing lesion in the right posterior parietal bone. There is no definite FDG avidity in this region on the recent PET/CT, arguing against malignancy, however close attention to this area on follow-up imaging is recommended.    08/05/2023 Imaging   CT chest IMPRESSION:   -Right upper lobectomy with moderate sized loculated right pleural effusion. New 0.3 cm right lower lobe micronodule. Recommend attention on follow-up CT in 3 months.   Few subcentimeter right infra-axillary lymph nodes with adjacent fat stranding and benign morphology, new since prior. Recommendation follow-up CT.   Markedly enlarged left atrium and mild qualitative dilatation of right atrium. Extensive coronary artery calcifications.    09/06/2023 -  Chemotherapy   Patient is  on Treatment Plan : METASTATIC MELANOMA Nivolumab  (240) q14d     09/19/2023 Cancer Staging   Staging  form: Melanoma of the Skin, AJCC 7th Edition - Clinical: Stage IV (TX, N0, M1c) - Signed by Tina Nicholas BROCKS, MD on 09/19/2023 Biopsy of metastatic site performed: Yes Source of metastatic specimen: Lung   11/19/2023 Imaging   MRI brain: Positive treatment response, previously seen brain lesions are no longer detectable. No new lesion.   11/21/2023 PET scan   PET: after 6C of nivo every 2 weeks. Marked response to therapy, including resolution of hepatic and osseous hypermetabolism.      PHYSICAL EXAMINATION: ECOG PERFORMANCE STATUS: 1 - Symptomatic but completely ambulatory  Vitals:   03/05/24 1115  BP: (!) 115/53  Pulse: 60  Resp: 20  Temp: (!) 97.5 F (36.4 C)  SpO2: 100%   Filed Weights   03/05/24 1115  Weight: 177 lb 12.8 oz (80.6 kg)    GENERAL: alert, no distress and comfortable SKIN: skin color normal and no jaundice on exposed skin Chronic eczema EYES: normal, sclera clear OROPHARYNX: no exudate  NECK: No palpable mass LYMPH:  no palpable cervical, axillary lymphadenopathy  LUNGS: clear to auscultation and percussion with normal breathing effort HEART: regular rate & rhythm  ABDOMEN: abdomen soft, non-tender and nondistended. Musculoskeletal: no edema NEURO: no focal motor/sensory deficits.  Relevant data reviewed during this visit included labs.

## 2024-03-05 NOTE — Assessment & Plan Note (Addendum)
 NED on new MRI No new symptoms today. Exam normal.

## 2024-03-05 NOTE — Assessment & Plan Note (Addendum)
 Excellent response Continue nivolumab  today Monitor for irAE Repeat labs and physical exam before each treatment. Repeat staging after C12 in ~mid to late July

## 2024-03-05 NOTE — Patient Instructions (Signed)
 [facility] - A DEPT OF [facility]  Discharge Instructions: Thank you for choosing [facility] to provide your oncology and hematology care.   If you have a lab appointment with the Cancer Center, please go directly to the Cancer Center and check in at the registration area.   Wear comfortable clothing and clothing appropriate for easy access to any Portacath or PICC line.   We strive to give you quality time with your provider. You may need to reschedule your appointment if you arrive late (15 or more minutes).  Arriving late affects you and other patients whose appointments are after yours.  Also, if you miss three or more appointments without notifying the office, you may be dismissed from the clinic at the provider's discretion.      For prescription refill requests, have your pharmacy contact our office and allow 72 hours for refills to be completed.    Today you received the following chemotherapy and/or immunotherapy agents: Opdivo      To help prevent nausea and vomiting after your treatment, we encourage you to take your nausea medication as directed.  BELOW ARE SYMPTOMS THAT SHOULD BE REPORTED IMMEDIATELY: *FEVER GREATER THAN 100.4 F (38 C) OR HIGHER *CHILLS OR SWEATING *NAUSEA AND VOMITING THAT IS NOT CONTROLLED WITH YOUR NAUSEA MEDICATION *UNUSUAL SHORTNESS OF BREATH *UNUSUAL BRUISING OR BLEEDING *URINARY PROBLEMS (pain or burning when urinating, or frequent urination) *BOWEL PROBLEMS (unusual diarrhea, constipation, pain near the anus) TENDERNESS IN MOUTH AND THROAT WITH OR WITHOUT PRESENCE OF ULCERS (sore throat, sores in mouth, or a toothache) UNUSUAL RASH, SWELLING OR PAIN  UNUSUAL VAGINAL DISCHARGE OR ITCHING   Items with * indicate a potential emergency and should be followed up as soon as possible or go to the Emergency Department if any problems should occur.  Please show the CHEMOTHERAPY ALERT CARD or IMMUNOTHERAPY  ALERT CARD at check-in to the Emergency Department and triage nurse.  Should you have questions after your visit or need to cancel or reschedule your appointment, please contact [facility]  Dept: [phone]  and follow the prompts.  Office hours are 8:00 a.m. to 4:30 p.m. Monday - Friday. Please note that voicemails left after 4:00 p.m. may not be returned until the following business day.  We are closed weekends and major holidays. You have access to a nurse at all times for urgent questions. Please call the main number to the clinic Dept: [phone] and follow the prompts.   For any non-urgent questions, you may also contact your provider using MyChart. We now offer e-Visits for anyone 57 and older to request care online for non-urgent symptoms. For details visit [email or web address].   Also download the MyChart app! Go to the app store, search "MyChart", open the app, select , and log in with your MyChart username and password.

## 2024-03-05 NOTE — Assessment & Plan Note (Addendum)
 No swelling or redness Continue monitor Report if worsening

## 2024-03-10 DIAGNOSIS — C44321 Squamous cell carcinoma of skin of nose: Secondary | ICD-10-CM | POA: Diagnosis not present

## 2024-03-10 DIAGNOSIS — Z85820 Personal history of malignant melanoma of skin: Secondary | ICD-10-CM | POA: Diagnosis not present

## 2024-03-19 ENCOUNTER — Encounter (HOSPITAL_COMMUNITY)
Admission: RE | Admit: 2024-03-19 | Discharge: 2024-03-19 | Disposition: A | Discharge status: Home / Self Care | Source: Ambulatory Visit

## 2024-03-19 DIAGNOSIS — C7931 Secondary malignant neoplasm of brain: Secondary | ICD-10-CM | POA: Diagnosis not present

## 2024-03-19 DIAGNOSIS — C439 Malignant melanoma of skin, unspecified: Secondary | ICD-10-CM | POA: Insufficient documentation

## 2024-03-19 DIAGNOSIS — C434 Malignant melanoma of scalp and neck: Secondary | ICD-10-CM | POA: Diagnosis not present

## 2024-03-19 LAB — GLUCOSE, CAPILLARY: Glucose-Capillary: 118 mg/dL — ABNORMAL HIGH (ref 70–99)

## 2024-03-19 MED ORDER — FLUDEOXYGLUCOSE F - 18 (FDG) INJECTION: 8.8100 | Freq: Once | INTRAVENOUS | Status: DC | PRN

## 2024-03-19 MED ORDER — FLUDEOXYGLUCOSE F - 18 (FDG) INJECTION
8.8100 | Freq: Once | INTRAVENOUS | Status: AC | PRN
  Administered 2024-03-19: 8.81 via INTRAVENOUS

## 2024-03-19 NOTE — Assessment & Plan Note (Signed)
 No swelling or redness Continue monitor Report if worsening

## 2024-03-19 NOTE — Progress Notes (Unsigned)
 [facility] OFFICE PROGRESS NOTE  Patient Care Team: [name], MD as PCP - General (Family Medicine) [name], MD as PCP - Electrophysiology (Cardiology) [name], MD (Cardiology) [name], MD as Attending Physician (Urology) [name], MD as Consulting Physician (Dermatology) [name], MD as Consulting Physician (Ophthalmology) [name], MD as Consulting Physician (Gastroenterology) [name], [facility] as Pharmacist (Pharmacist) [name], RN as [facility] Care Management  83 y.o.man undergoing nivolumab  for stage IV melanoma being follow up for metastatic melanoma on treatment. Recently with immune related side effects of diarrhea.   Current Diagnosis: Metastatic melanoma with brain and lung metastases.  Suspicious for bone metastases. BRAF negative. 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 (Started 09/06/23) 08/19/23 SRS to 4 brain mets  Will hold treatment today. Start medrol dosepak for worsening arthritis and weakness. Assessment & Plan Malignant melanoma of scalp [facility] Repeat staging showed excellent response Hold nivolumab  today Monitor for irAE Repeat labs and physical exam before each treatment.  Stiffness in joint Worsening on bilateral shoulders and weakness Will hold treatment today Trial of medrol dosepak. Prescription sent today. Continue monitor Report if worsening  Return on 8/1.   [name], MD  INTERVAL HISTORY: Patient returns for follow-up. Report more soreness, legs are weaker and stiffer after last treatment. Shoulder pain on both side. No swelling. Movement helps.  No stomach pain, coughing, rash, loss of appetite, focal weakness or new headache.  Oncology History  Malignant melanoma (HCC)  01/01/2015 Initial Diagnosis   Malignant melanoma (HCC)   12/2014 Surgery   April 2016 WLE and SLN 01/31/2015: Reexcision    02/25/2018 Imaging    CT CAP Subcentimeter right upper lobe subpleural and left upper lobe perifissural nodules, new from 01/24/2016, indeterminate. Recommend short interval follow-up (3 months), given histor    04/07/2019 Imaging   CT Chest: No evidence of metastatic disease within the chest. Left upper lobe perifissural nodule no longer evident. Right upper lobe subpleural nodule no longer evident.   CT AP No evidence of metastatic disease.     04/2020 Pathology Results   Summer 2021: New scalp lesion, in transit  path: -Malignant melanoma, involving dermal tissue    04/12/2020 Imaging   CT chest: NED CT AP: NED    05/04/2020 Imaging   CT Neck: No mass or lymphadenopathy within the neck.    05/25/2020 - 07/25/2020 Chemotherapy   5 cycles of TVEC   01/24/2021 PET scan   PET whole body - No abnormal foci of increased radiotracer uptake to suggest metastatic disease.  -Small focus of increased uptake within the proximal sigmoid colon without adjacent stranding or definite wall thickening, indeterminant. Recommend further evaluation with colonoscopy if clinically    08/24/2021 PET scan   PET whole body - No abnormal foci of increased radiotracer uptake to suggest metastatic disease.   - Small focus of increased uptake within the proximal sigmoid colon, similar to 01/24/2021. Recommend clinical correlation and/or further evaluation with colonoscopy if clinically indicated    01/31/2022 Surgery   (Outside case, [phone], 1 slide, 01/23/2022) Skin, left inferior lateral anterior scalp, shave - Metastatic malignant melanoma Size: 2 mm Pattern: Pigmented epithelioid cells forming small plaque in superficial dermis and showing epidermotropism Tumor infiltrating lymphocytes: Brisk Nuclear grade: 3/3 Margins: Focally present adjacent to edge of tissue sections    02/21/2022 - 05/09/2022 Chemotherapy   had an intransit recurrence of melanoma and was retreated with TVEC.  Biopsy in October 2023 showed NED.     05/09/2022 PET scan   - Mildly avid 3 mm right upper lobe subcentimeter pulmonary nodule, possibly increased from 2 mm on prior; recommend attention on follow-up.   -No definite evidence of metabolically active disease.    05/28/2022 Pathology Results   Diagnosis   Skin, Left temple, excision - Dermal fibrosis with lymphocytic and granulomatous inflammation consistent with site of recent therapy - No residual melanoma identified - Margins free of tumor      02/15/2023 PET scan   - Focal area of hypermetabolic activity is seen in the ascending colon, indeterminate. Recommend correlation with colonoscopy.   -Unchanged size of mildly hypermetabolic left paratracheal lymph node, indeterminate. Recommend attention on follow-up imaging.   -Pancreatic calcifications likely reflect sequela of chronic pancreatitis.   -Bladder diverticuli and enlarged prostate.    05/13/2023 PET scan   1.  Evidence of progressive disease as demonstrated by interval enlargement of right upper lobe pulmonary nodule most concerning for metastasis by melanoma. Additional single right pulmonary nodule and 2 sites of focal osseous avidity which are suspicious but indeterminate for additional sites of involvement by metastasis involving the T8 and L1 vertebral bodies.  2.  Focal uptake within the sigmoid colon which has increased in conspicuity over time. Recommend correlation with colonoscopy if not recently completed with differential diagnosis including adenoma, carcinoma, and inflammatory lesion (given associated coarse calcifications/high density material). No evidence of complication (i.e.  fluid collection, or free air) by low-dose unenhanced CT.  3.  Persistent FDG abnormality of the left anterior shin associated with a dermal nodule. Recommend direct inspection as additional site of cutaneous neoplasm is not excluded.    06/14/2023 Pathology Results   A: Lung, right upper lobe, lobectomy  - Metastatic melanoma,  multifocal (largest focus 2.4 cm, with two sub-centimeter nodules also identified microscopically, each 3 mm) - Surgical margins negative  - BRAF (VE1): Negative - TILs: brisk - Two lymph nodes, negative for malignancy (0/2)   07/17/2023 Genetic Testing   [facility]: pertinent negatives: BRAF. KIT TERT C.-124C>T variant- promoter mutation NRAS p.G13D Missense variant (exon 2) - GOF TP53 p.R342P ARID1A p.G285fs NF1 p.R1362* PHLPP2 p.Q300* NF1 p.K191_R192delinsN*  Multiple VUS findings.   08/05/2023 Imaging   MRI brain  --Single enhancing right occipital cortical lesion is most concerning for intracranial metastatic disease.   --Indeterminate 7 mm enhancing lesion in the right posterior parietal bone. There is no definite FDG avidity in this region on the recent PET/CT, arguing against malignancy, however close attention to this area on follow-up imaging is recommended.    08/05/2023 Imaging   CT chest IMPRESSION:   -Right upper lobectomy with moderate sized loculated right pleural effusion. New 0.3 cm right lower lobe micronodule. Recommend attention on follow-up CT in 3 months.   Few subcentimeter right infra-axillary lymph nodes with adjacent fat stranding and benign morphology, new since prior. Recommendation follow-up CT.   Markedly enlarged left atrium and mild qualitative dilatation of right atrium. Extensive coronary artery calcifications.    09/06/2023 -  Chemotherapy   Patient is on Treatment Plan : METASTATIC MELANOMA Nivolumab  (240) q14d     09/19/2023 Cancer Staging   Staging form: Melanoma of the Skin, AJCC 7th Edition - Clinical: Stage IV (TX, N0, M1c) - Signed by Tina [name], MD on 09/19/2023 Biopsy of metastatic site performed: Yes Source of metastatic specimen: Lung   11/19/2023 Imaging   MRI brain: Positive treatment response, previously seen brain lesions  are no longer detectable. No new lesion.   11/21/2023 PET scan   PET: after 6C of nivo every 2 weeks.  Marked response to therapy, including resolution of hepatic and osseous hypermetabolism.      PHYSICAL EXAMINATION: ECOG PERFORMANCE STATUS: 1 - Symptomatic but completely ambulatory  Vitals:   03/20/24 1022  BP: 129/75  Pulse: 71  Resp: 16  Temp: 97.9 F (36.6 C)  SpO2: 97%   Filed Weights   03/20/24 1022  Weight: 177 lb (80.3 kg)    GENERAL: alert, no distress and comfortable SKIN: skin color normal and no bruising or petechiae or jaundice on exposed skin EYES: normal, sclera clear NECK: No palpable mass LYMPH:  no palpable cervical lymphadenopathy  LUNGS: clear to auscultation and percussion with normal breathing effort HEART: regular rate & rhythm  ABDOMEN: abdomen soft, non-tender and nondistended. Musculoskeletal: no edema NEURO: no focal motor/sensory deficits  Relevant data reviewed during this visit included labs. I reviewed his PET scan.

## 2024-03-19 NOTE — Assessment & Plan Note (Signed)
 Repeat staging showed excellent response Hold nivolumab  today Monitor for irAE Repeat labs and physical exam before each treatment.

## 2024-03-20 ENCOUNTER — Inpatient Hospital Stay

## 2024-03-20 VITALS — BP 129/75 | HR 71 | Temp 97.9°F | Resp 16 | Ht 71.0 in | Wt 177.0 lb

## 2024-03-20 DIAGNOSIS — C434 Malignant melanoma of scalp and neck: Secondary | ICD-10-CM | POA: Diagnosis not present

## 2024-03-20 DIAGNOSIS — M2560 Stiffness of unspecified joint, not elsewhere classified: Secondary | ICD-10-CM | POA: Diagnosis not present

## 2024-03-20 DIAGNOSIS — C7931 Secondary malignant neoplasm of brain: Secondary | ICD-10-CM | POA: Diagnosis not present

## 2024-03-20 DIAGNOSIS — Z5112 Encounter for antineoplastic immunotherapy: Secondary | ICD-10-CM | POA: Diagnosis not present

## 2024-03-20 DIAGNOSIS — C7800 Secondary malignant neoplasm of unspecified lung: Secondary | ICD-10-CM | POA: Diagnosis not present

## 2024-03-20 DIAGNOSIS — D649 Anemia, unspecified: Secondary | ICD-10-CM | POA: Diagnosis not present

## 2024-03-20 DIAGNOSIS — C438 Malignant melanoma of overlapping sites of skin: Secondary | ICD-10-CM

## 2024-03-20 LAB — CBC WITH DIFFERENTIAL (CANCER CENTER ONLY)
Abs Immature Granulocytes: 0.03 K/uL (ref 0.00–0.07)
Basophils Absolute: 0 K/uL (ref 0.0–0.1)
Basophils Relative: 1 %
Eosinophils Absolute: 0.1 K/uL (ref 0.0–0.5)
Eosinophils Relative: 1 %
HCT: 34.9 % — ABNORMAL LOW (ref 39.0–52.0)
Hemoglobin: 12.4 g/dL — ABNORMAL LOW (ref 13.0–17.0)
Immature Granulocytes: 0 %
Lymphocytes Relative: 12 %
Lymphs Abs: 0.9 K/uL (ref 0.7–4.0)
MCH: 33.9 pg (ref 26.0–34.0)
MCHC: 35.5 g/dL (ref 30.0–36.0)
MCV: 95.4 fL (ref 80.0–100.0)
Monocytes Absolute: 0.9 K/uL (ref 0.1–1.0)
Monocytes Relative: 13 %
Neutro Abs: 5.4 K/uL (ref 1.7–7.7)
Neutrophils Relative %: 73 %
Platelet Count: 270 K/uL (ref 150–400)
RBC: 3.66 MIL/uL — ABNORMAL LOW (ref 4.22–5.81)
RDW: 13.6 % (ref 11.5–15.5)
WBC Count: 7.4 K/uL (ref 4.0–10.5)
nRBC: 0 % (ref 0.0–0.2)

## 2024-03-20 LAB — CMP (CANCER CENTER ONLY)
ALT: 23 U/L (ref 0–44)
AST: 27 U/L (ref 15–41)
Albumin: 4.2 g/dL (ref 3.5–5.0)
Alkaline Phosphatase: 105 U/L (ref 38–126)
Anion gap: 7 (ref 5–15)
BUN: 21 mg/dL (ref 8–23)
CO2: 26 mmol/L (ref 22–32)
Calcium: 9.7 mg/dL (ref 8.9–10.3)
Chloride: 96 mmol/L — ABNORMAL LOW (ref 98–111)
Creatinine: 1.06 mg/dL (ref 0.61–1.24)
GFR, Estimated: >60 mL/min (ref >60)
Glucose, Bld: 199 mg/dL — ABNORMAL HIGH (ref 70–99)
Potassium: 3.9 mmol/L (ref 3.5–5.1)
Sodium: 129 mmol/L — ABNORMAL LOW (ref 135–145)
Total Bilirubin: 1 mg/dL (ref 0.0–1.2)
Total Protein: 7.4 g/dL (ref 6.5–8.1)

## 2024-03-20 LAB — T4, FREE: Free T4: 1.2 ng/dL — ABNORMAL HIGH (ref 0.61–1.12)

## 2024-03-20 LAB — TSH: TSH: 2.21 u[IU]/mL (ref 0.350–4.500)

## 2024-03-20 LAB — LACTATE DEHYDROGENASE: LDH: 129 U/L (ref 98–192)

## 2024-03-20 MED ORDER — METHYLPREDNISOLONE 4 MG PO TBPK: ORAL_TABLET | ORAL | 0 refills | Status: DC

## 2024-03-23 ENCOUNTER — Other Ambulatory Visit: Payer: Self-pay | Admitting: Family Medicine

## 2024-03-23 ENCOUNTER — Ambulatory Visit: Payer: Self-pay

## 2024-03-23 DIAGNOSIS — M130 Polyarthritis, unspecified: Secondary | ICD-10-CM

## 2024-03-28 ENCOUNTER — Other Ambulatory Visit: Payer: Self-pay | Admitting: Internal Medicine

## 2024-03-30 NOTE — Telephone Encounter (Signed)
 Prescription refill request for Eliquis  received. Indication:AFIB Last office visit:needs appt Scr:na Age: 83 Weight:80.3  kg  Prescription refilled

## 2024-04-02 ENCOUNTER — Other Ambulatory Visit: Payer: Self-pay | Admitting: *Deleted

## 2024-04-02 DIAGNOSIS — C7931 Secondary malignant neoplasm of brain: Secondary | ICD-10-CM

## 2024-04-02 DIAGNOSIS — C434 Malignant melanoma of scalp and neck: Secondary | ICD-10-CM

## 2024-04-03 ENCOUNTER — Inpatient Hospital Stay

## 2024-04-03 VITALS — BP 136/67 | HR 69 | Temp 97.7°F | Resp 18 | Wt 179.0 lb

## 2024-04-03 DIAGNOSIS — C434 Malignant melanoma of scalp and neck: Secondary | ICD-10-CM | POA: Diagnosis not present

## 2024-04-03 DIAGNOSIS — C7931 Secondary malignant neoplasm of brain: Secondary | ICD-10-CM | POA: Diagnosis not present

## 2024-04-03 DIAGNOSIS — M130 Polyarthritis, unspecified: Secondary | ICD-10-CM | POA: Diagnosis not present

## 2024-04-03 DIAGNOSIS — M2560 Stiffness of unspecified joint, not elsewhere classified: Secondary | ICD-10-CM | POA: Diagnosis not present

## 2024-04-03 DIAGNOSIS — Z5112 Encounter for antineoplastic immunotherapy: Secondary | ICD-10-CM | POA: Insufficient documentation

## 2024-04-03 DIAGNOSIS — Z79899 Other long term (current) drug therapy: Secondary | ICD-10-CM | POA: Diagnosis not present

## 2024-04-03 DIAGNOSIS — C7800 Secondary malignant neoplasm of unspecified lung: Secondary | ICD-10-CM | POA: Insufficient documentation

## 2024-04-03 LAB — CMP (CANCER CENTER ONLY)
ALT: 22 U/L (ref 0–44)
AST: 24 U/L (ref 15–41)
Albumin: 4.2 g/dL (ref 3.5–5.0)
Alkaline Phosphatase: 74 U/L (ref 38–126)
Anion gap: 6 (ref 5–15)
BUN: 22 mg/dL (ref 8–23)
CO2: 28 mmol/L (ref 22–32)
Calcium: 9.2 mg/dL (ref 8.9–10.3)
Chloride: 97 mmol/L — ABNORMAL LOW (ref 98–111)
Creatinine: 1.3 mg/dL — ABNORMAL HIGH (ref 0.61–1.24)
GFR, Estimated: 55 mL/min — ABNORMAL LOW (ref >60)
Glucose, Bld: 149 mg/dL — ABNORMAL HIGH (ref 70–99)
Potassium: 4.1 mmol/L (ref 3.5–5.1)
Sodium: 131 mmol/L — ABNORMAL LOW (ref 135–145)
Total Bilirubin: 0.8 mg/dL (ref 0.0–1.2)
Total Protein: 7 g/dL (ref 6.5–8.1)

## 2024-04-03 LAB — CBC WITH DIFFERENTIAL (CANCER CENTER ONLY)
Abs Immature Granulocytes: 0.03 K/uL (ref 0.00–0.07)
Basophils Absolute: 0 K/uL (ref 0.0–0.1)
Basophils Relative: 0 %
Eosinophils Absolute: 0.1 K/uL (ref 0.0–0.5)
Eosinophils Relative: 1 %
HCT: 33.9 % — ABNORMAL LOW (ref 39.0–52.0)
Hemoglobin: 11.9 g/dL — ABNORMAL LOW (ref 13.0–17.0)
Immature Granulocytes: 0 %
Lymphocytes Relative: 13 %
Lymphs Abs: 0.9 K/uL (ref 0.7–4.0)
MCH: 33.8 pg (ref 26.0–34.0)
MCHC: 35.1 g/dL (ref 30.0–36.0)
MCV: 96.3 fL (ref 80.0–100.0)
Monocytes Absolute: 0.9 K/uL (ref 0.1–1.0)
Monocytes Relative: 12 %
Neutro Abs: 5.1 K/uL (ref 1.7–7.7)
Neutrophils Relative %: 74 %
Platelet Count: 185 K/uL (ref 150–400)
RBC: 3.52 MIL/uL — ABNORMAL LOW (ref 4.22–5.81)
RDW: 13.9 % (ref 11.5–15.5)
WBC Count: 6.9 K/uL (ref 4.0–10.5)
nRBC: 0 % (ref 0.0–0.2)

## 2024-04-03 LAB — LACTATE DEHYDROGENASE: LDH: 134 U/L (ref 98–192)

## 2024-04-03 LAB — TSH: TSH: 2.16 u[IU]/mL (ref 0.350–4.500)

## 2024-04-03 LAB — T4, FREE: Free T4: 1.19 ng/dL — ABNORMAL HIGH (ref 0.61–1.12)

## 2024-04-03 LAB — C-REACTIVE PROTEIN: CRP: 0.8 mg/dL (ref <1.0)

## 2024-04-03 MED ORDER — METHYLPREDNISOLONE 4 MG PO TBPK: ORAL_TABLET | ORAL | 0 refills | Status: DC

## 2024-04-03 NOTE — Assessment & Plan Note (Addendum)
 Holding treatment for polyarthritis Return as scheduled on 8/21

## 2024-04-03 NOTE — Assessment & Plan Note (Addendum)
 NED from last MRI Continue monitor

## 2024-04-03 NOTE — Assessment & Plan Note (Signed)
 Improved

## 2024-04-03 NOTE — Progress Notes (Signed)
 [facility] OFFICE PROGRESS NOTE  Patient Care Team: [name], MD as PCP - General (Family Medicine) [name], MD as PCP - Electrophysiology (Cardiology) [name], MD (Cardiology) [name], MD as Attending Physician (Urology) [name], MD as Consulting Physician (Dermatology) [name], MD as Consulting Physician (Ophthalmology) [name], MD as Consulting Physician (Gastroenterology) [name], [facility] as Pharmacist (Pharmacist) [name], RN as [facility] Care Management  83 y.o.man undergoing nivolumab  for stage IV melanoma being follow up for metastatic melanoma on treatment.   Previous immune related side effects of diarrhea resolved. Side effect with polyarthritis holding immunotherapy.    Current Diagnosis: Metastatic melanoma with brain and lung metastases.  Suspicious for bone metastases. BRAF negative. 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 (Started 09/06/23) 08/19/23 SRS to 4 brain mets   Last PET showed NED.  Holding treatment. Arthritis is improved after medrol  dosepak. Assessment & Plan Malignant melanoma of scalp (HCC) Holding treatment for polyarthritis Return as scheduled on 8/21 Malignant melanoma metastatic to brain [facility]) NED from last MRI Continue monitor Polyarthritis Completed medrol  dosepak  If recurring will resume steroid Stiffness in joint Improved.    [name], MD  INTERVAL HISTORY: Patient returns for follow-up. Joint pain improved quickly on steroid. He is now off for a week. No worsening weakness. No chest pain, coughing, diarrhea, rash.   Oncology History  Malignant melanoma (HCC)  01/01/2015 Initial Diagnosis   Malignant melanoma (HCC)   12/2014 Surgery   April 2016 WLE and SLN 01/31/2015: Reexcision    02/25/2018 Imaging   CT CAP Subcentimeter right upper lobe subpleural and left upper lobe perifissural nodules, new  from 01/24/2016, indeterminate. Recommend short interval follow-up (3 months), given histor    04/07/2019 Imaging   CT Chest: No evidence of metastatic disease within the chest. Left upper lobe perifissural nodule no longer evident. Right upper lobe subpleural nodule no longer evident.   CT AP No evidence of metastatic disease.     04/2020 Pathology Results   Summer 2021: New scalp lesion, in transit  path: -Malignant melanoma, involving dermal tissue    04/12/2020 Imaging   CT chest: NED CT AP: NED    05/04/2020 Imaging   CT Neck: No mass or lymphadenopathy within the neck.    05/25/2020 - 07/25/2020 Chemotherapy   5 cycles of TVEC   01/24/2021 PET scan   PET whole body - No abnormal foci of increased radiotracer uptake to suggest metastatic disease.  -Small focus of increased uptake within the proximal sigmoid colon without adjacent stranding or definite wall thickening, indeterminant. Recommend further evaluation with colonoscopy if clinically    08/24/2021 PET scan   PET whole body - No abnormal foci of increased radiotracer uptake to suggest metastatic disease.   - Small focus of increased uptake within the proximal sigmoid colon, similar to 01/24/2021. Recommend clinical correlation and/or further evaluation with colonoscopy if clinically indicated    01/31/2022 Surgery   (Outside case, [phone], 1 slide, 01/23/2022) Skin, left inferior lateral anterior scalp, shave - Metastatic malignant melanoma Size: 2 mm Pattern: Pigmented epithelioid cells forming small plaque in superficial dermis and showing epidermotropism Tumor infiltrating lymphocytes: Brisk Nuclear grade: 3/3 Margins: Focally present adjacent to edge of tissue sections    02/21/2022 - 05/09/2022 Chemotherapy   had an intransit recurrence of melanoma and was retreated with TVEC.  Biopsy in October 2023 showed NED.    05/09/2022 PET scan   -  Mildly avid 3 mm right upper lobe subcentimeter pulmonary nodule, possibly  increased from 2 mm on prior; recommend attention on follow-up.   -No definite evidence of metabolically active disease.    05/28/2022 Pathology Results   Diagnosis   Skin, Left temple, excision - Dermal fibrosis with lymphocytic and granulomatous inflammation consistent with site of recent therapy - No residual melanoma identified - Margins free of tumor      02/15/2023 PET scan   - Focal area of hypermetabolic activity is seen in the ascending colon, indeterminate. Recommend correlation with colonoscopy.   -Unchanged size of mildly hypermetabolic left paratracheal lymph node, indeterminate. Recommend attention on follow-up imaging.   -Pancreatic calcifications likely reflect sequela of chronic pancreatitis.   -Bladder diverticuli and enlarged prostate.    05/13/2023 PET scan   1.  Evidence of progressive disease as demonstrated by interval enlargement of right upper lobe pulmonary nodule most concerning for metastasis by melanoma. Additional single right pulmonary nodule and 2 sites of focal osseous avidity which are suspicious but indeterminate for additional sites of involvement by metastasis involving the T8 and L1 vertebral bodies.  2.  Focal uptake within the sigmoid colon which has increased in conspicuity over time. Recommend correlation with colonoscopy if not recently completed with differential diagnosis including adenoma, carcinoma, and inflammatory lesion (given associated coarse calcifications/high density material). No evidence of complication (i.e.  fluid collection, or free air) by low-dose unenhanced CT.  3.  Persistent FDG abnormality of the left anterior shin associated with a dermal nodule. Recommend direct inspection as additional site of cutaneous neoplasm is not excluded.    06/14/2023 Pathology Results   A: Lung, right upper lobe, lobectomy  - Metastatic melanoma, multifocal (largest focus 2.4 cm, with two sub-centimeter nodules also identified microscopically, each  3 mm) - Surgical margins negative  - BRAF (VE1): Negative - TILs: brisk - Two lymph nodes, negative for malignancy (0/2)   07/17/2023 Genetic Testing   [facility]: pertinent negatives: BRAF. KIT TERT C.-124C>T variant- promoter mutation NRAS p.G13D Missense variant (exon 2) - GOF TP53 p.R342P ARID1A p.G285fs NF1 p.R1362* PHLPP2 p.Q300* NF1 p.K191_R192delinsN*  Multiple VUS findings.   08/05/2023 Imaging   MRI brain  --Single enhancing right occipital cortical lesion is most concerning for intracranial metastatic disease.   --Indeterminate 7 mm enhancing lesion in the right posterior parietal bone. There is no definite FDG avidity in this region on the recent PET/CT, arguing against malignancy, however close attention to this area on follow-up imaging is recommended.    08/05/2023 Imaging   CT chest IMPRESSION:   -Right upper lobectomy with moderate sized loculated right pleural effusion. New 0.3 cm right lower lobe micronodule. Recommend attention on follow-up CT in 3 months.   Few subcentimeter right infra-axillary lymph nodes with adjacent fat stranding and benign morphology, new since prior. Recommendation follow-up CT.   Markedly enlarged left atrium and mild qualitative dilatation of right atrium. Extensive coronary artery calcifications.    09/06/2023 -  Chemotherapy   Patient is on Treatment Plan : METASTATIC MELANOMA Nivolumab  (240) q14d     09/19/2023 Cancer Staging   Staging form: Melanoma of the Skin, AJCC 7th Edition - Clinical: Stage IV (TX, N0, M1c) - Signed by Tina [name], MD on 09/19/2023 Biopsy of metastatic site performed: Yes Source of metastatic specimen: Lung   11/19/2023 Imaging   MRI brain: Positive treatment response, previously seen brain lesions are no longer detectable. No new lesion.   11/21/2023 PET scan   PET:  after 6C of nivo every 2 weeks. Marked response to therapy, including resolution of hepatic and osseous hypermetabolism.      PHYSICAL  EXAMINATION: ECOG PERFORMANCE STATUS: 1 - Symptomatic but completely ambulatory  Vitals:   04/03/24 0905  BP: 136/67  Pulse: 69  Resp: 18  Temp: 97.7 F (36.5 C)  SpO2: 100%   Filed Weights   04/03/24 0905  Weight: 179 lb (81.2 kg)   GENERAL: alert, no distress and comfortable SKIN: skin color normal and underlying stable eczema LUNGS: clear to auscultation and percussion with normal breathing effort HEART: irregularly irregular Musculoskeletal: no edema and no joint swelling   Relevant data reviewed during this visit included labs.

## 2024-04-03 NOTE — Assessment & Plan Note (Addendum)
 Completed medrol  dosepak  If recurring will resume steroid

## 2024-04-16 ENCOUNTER — Ambulatory Visit

## 2024-04-16 ENCOUNTER — Other Ambulatory Visit

## 2024-04-22 NOTE — Progress Notes (Unsigned)
 [facility] OFFICE PROGRESS NOTE  Patient Care Team: [name], MD as PCP - General (Family Medicine) [name], MD as PCP - Electrophysiology (Cardiology) [name], MD (Cardiology) [name], MD as Attending Physician (Urology) [name], MD as Consulting Physician (Dermatology) [name], MD as Consulting Physician (Ophthalmology) [name], MD as Consulting Physician (Gastroenterology) [name], [facility] as Pharmacist (Pharmacist) [name], RN as [facility] Care Management  83 y.o.man undergoing nivolumab  for stage IV melanoma being follow up for metastatic melanoma on treatment.    Previous immune related side effects of diarrhea resolved. Side effect with polyarthritis holding immunotherapy.    Current Diagnosis: Metastatic melanoma with brain and lung metastases.  Suspicious for bone metastases. BRAF negative. 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 (Started 09/06/23) 08/19/23 SRS to 4 brain mets   Last PET showed NED.  Treatment held due to worsening arthritis.  Assessment & Plan   No orders of the defined types were placed in this encounter.    [name], MD  INTERVAL HISTORY: Patient returns for follow-up.  Oncology History  Malignant melanoma (HCC)  01/01/2015 Initial Diagnosis   Malignant melanoma (HCC)   12/2014 Surgery   April 2016 WLE and SLN 01/31/2015: Reexcision    02/25/2018 Imaging   CT CAP Subcentimeter right upper lobe subpleural and left upper lobe perifissural nodules, new from 01/24/2016, indeterminate. Recommend short interval follow-up (3 months), given histor    04/07/2019 Imaging   CT Chest: No evidence of metastatic disease within the chest. Left upper lobe perifissural nodule no longer evident. Right upper lobe subpleural nodule no longer evident.   CT AP No evidence of metastatic disease.     04/2020 Pathology Results   Summer  2021: New scalp lesion, in transit  path: -Malignant melanoma, involving dermal tissue    04/12/2020 Imaging   CT chest: NED CT AP: NED    05/04/2020 Imaging   CT Neck: No mass or lymphadenopathy within the neck.    05/25/2020 - 07/25/2020 Chemotherapy   5 cycles of TVEC   01/24/2021 PET scan   PET whole body - No abnormal foci of increased radiotracer uptake to suggest metastatic disease.  -Small focus of increased uptake within the proximal sigmoid colon without adjacent stranding or definite wall thickening, indeterminant. Recommend further evaluation with colonoscopy if clinically    08/24/2021 PET scan   PET whole body - No abnormal foci of increased radiotracer uptake to suggest metastatic disease.   - Small focus of increased uptake within the proximal sigmoid colon, similar to 01/24/2021. Recommend clinical correlation and/or further evaluation with colonoscopy if clinically indicated    01/31/2022 Surgery   (Outside case, [phone], 1 slide, 01/23/2022) Skin, left inferior lateral anterior scalp, shave - Metastatic malignant melanoma Size: 2 mm Pattern: Pigmented epithelioid cells forming small plaque in superficial dermis and showing epidermotropism Tumor infiltrating lymphocytes: Brisk Nuclear grade: 3/3 Margins: Focally present adjacent to edge of tissue sections    02/21/2022 - 05/09/2022 Chemotherapy   had an intransit recurrence of melanoma and was retreated with TVEC.  Biopsy in October 2023 showed NED.    05/09/2022 PET scan   - Mildly avid 3 mm right upper lobe subcentimeter pulmonary nodule, possibly increased from 2 mm on prior; recommend attention on follow-up.   -No definite evidence of metabolically active disease.    05/28/2022 Pathology Results   Diagnosis   Skin, Left temple, excision - Dermal  fibrosis with lymphocytic and granulomatous inflammation consistent with site of recent therapy - No residual melanoma identified - Margins free of tumor       02/15/2023 PET scan   - Focal area of hypermetabolic activity is seen in the ascending colon, indeterminate. Recommend correlation with colonoscopy.   -Unchanged size of mildly hypermetabolic left paratracheal lymph node, indeterminate. Recommend attention on follow-up imaging.   -Pancreatic calcifications likely reflect sequela of chronic pancreatitis.   -Bladder diverticuli and enlarged prostate.    05/13/2023 PET scan   1.  Evidence of progressive disease as demonstrated by interval enlargement of right upper lobe pulmonary nodule most concerning for metastasis by melanoma. Additional single right pulmonary nodule and 2 sites of focal osseous avidity which are suspicious but indeterminate for additional sites of involvement by metastasis involving the T8 and L1 vertebral bodies.  2.  Focal uptake within the sigmoid colon which has increased in conspicuity over time. Recommend correlation with colonoscopy if not recently completed with differential diagnosis including adenoma, carcinoma, and inflammatory lesion (given associated coarse calcifications/high density material). No evidence of complication (i.e.  fluid collection, or free air) by low-dose unenhanced CT.  3.  Persistent FDG abnormality of the left anterior shin associated with a dermal nodule. Recommend direct inspection as additional site of cutaneous neoplasm is not excluded.    06/14/2023 Pathology Results   A: Lung, right upper lobe, lobectomy  - Metastatic melanoma, multifocal (largest focus 2.4 cm, with two sub-centimeter nodules also identified microscopically, each 3 mm) - Surgical margins negative  - BRAF (VE1): Negative - TILs: brisk - Two lymph nodes, negative for malignancy (0/2)   07/17/2023 Genetic Testing   [facility]: pertinent negatives: BRAF. KIT TERT C.-124C>T variant- promoter mutation NRAS p.G13D Missense variant (exon 2) - GOF TP53 p.R342P ARID1A p.G285fs NF1 p.R1362* PHLPP2 p.Q300* NF1  p.K191_R192delinsN*  Multiple VUS findings.   08/05/2023 Imaging   MRI brain  --Single enhancing right occipital cortical lesion is most concerning for intracranial metastatic disease.   --Indeterminate 7 mm enhancing lesion in the right posterior parietal bone. There is no definite FDG avidity in this region on the recent PET/CT, arguing against malignancy, however close attention to this area on follow-up imaging is recommended.    08/05/2023 Imaging   CT chest IMPRESSION:   -Right upper lobectomy with moderate sized loculated right pleural effusion. New 0.3 cm right lower lobe micronodule. Recommend attention on follow-up CT in 3 months.   Few subcentimeter right infra-axillary lymph nodes with adjacent fat stranding and benign morphology, new since prior. Recommendation follow-up CT.   Markedly enlarged left atrium and mild qualitative dilatation of right atrium. Extensive coronary artery calcifications.    09/06/2023 -  Chemotherapy   Patient is on Treatment Plan : METASTATIC MELANOMA Nivolumab  (240) q14d     09/19/2023 Cancer Staging   Staging form: Melanoma of the Skin, AJCC 7th Edition - Clinical: Stage IV (TX, N0, M1c) - Signed by Tina [name], MD on 09/19/2023 Biopsy of metastatic site performed: Yes Source of metastatic specimen: Lung   11/19/2023 Imaging   MRI brain: Positive treatment response, previously seen brain lesions are no longer detectable. No new lesion.   11/21/2023 PET scan   PET: after 6C of nivo every 2 weeks. Marked response to therapy, including resolution of hepatic and osseous hypermetabolism.      PHYSICAL EXAMINATION: ECOG PERFORMANCE STATUS: {CHL ONC ECOG PS:[phone]}  There were no vitals filed for this visit. There were no vitals filed for  this visit.  GENERAL: alert, no distress and comfortable SKIN: skin color normal and no bruising or petechiae or jaundice on exposed skin EYES: normal, sclera clear OROPHARYNX: no exudate  NECK: No  palpable mass LYMPH:  no palpable cervical, axillary lymphadenopathy  LUNGS: clear to auscultation and percussion with normal breathing effort HEART: regular rate & rhythm  ABDOMEN: abdomen soft, non-tender and nondistended. Musculoskeletal: no edema NEURO: no focal motor/sensory deficits  Relevant data reviewed during this visit included ***

## 2024-04-23 ENCOUNTER — Inpatient Hospital Stay

## 2024-04-23 VITALS — BP 112/67 | HR 66 | Temp 97.0°F | Resp 20 | Wt 177.3 lb

## 2024-04-23 DIAGNOSIS — M130 Polyarthritis, unspecified: Secondary | ICD-10-CM

## 2024-04-23 DIAGNOSIS — Z79899 Other long term (current) drug therapy: Secondary | ICD-10-CM | POA: Diagnosis not present

## 2024-04-23 DIAGNOSIS — C438 Malignant melanoma of overlapping sites of skin: Secondary | ICD-10-CM

## 2024-04-23 DIAGNOSIS — C434 Malignant melanoma of scalp and neck: Secondary | ICD-10-CM | POA: Diagnosis not present

## 2024-04-23 DIAGNOSIS — C7931 Secondary malignant neoplasm of brain: Secondary | ICD-10-CM | POA: Diagnosis not present

## 2024-04-23 DIAGNOSIS — Z5112 Encounter for antineoplastic immunotherapy: Secondary | ICD-10-CM | POA: Diagnosis not present

## 2024-04-23 DIAGNOSIS — C7800 Secondary malignant neoplasm of unspecified lung: Secondary | ICD-10-CM | POA: Diagnosis not present

## 2024-04-23 LAB — CMP (CANCER CENTER ONLY)
ALT: 31 U/L (ref 0–44)
AST: 29 U/L (ref 15–41)
Albumin: 4.2 g/dL (ref 3.5–5.0)
Alkaline Phosphatase: 90 U/L (ref 38–126)
Anion gap: 7 (ref 5–15)
BUN: 16 mg/dL (ref 8–23)
CO2: 28 mmol/L (ref 22–32)
Calcium: 9.6 mg/dL (ref 8.9–10.3)
Chloride: 98 mmol/L (ref 98–111)
Creatinine: 1.04 mg/dL (ref 0.61–1.24)
GFR, Estimated: >60 mL/min (ref >60)
Glucose, Bld: 141 mg/dL — ABNORMAL HIGH (ref 70–99)
Potassium: 4 mmol/L (ref 3.5–5.1)
Sodium: 133 mmol/L — ABNORMAL LOW (ref 135–145)
Total Bilirubin: 1.1 mg/dL (ref 0.0–1.2)
Total Protein: 6.9 g/dL (ref 6.5–8.1)

## 2024-04-23 LAB — CBC WITH DIFFERENTIAL (CANCER CENTER ONLY)
Abs Immature Granulocytes: 0.03 K/uL (ref 0.00–0.07)
Basophils Absolute: 0.1 K/uL (ref 0.0–0.1)
Basophils Relative: 1 %
Eosinophils Absolute: 0.1 K/uL (ref 0.0–0.5)
Eosinophils Relative: 1 %
HCT: 34.6 % — ABNORMAL LOW (ref 39.0–52.0)
Hemoglobin: 11.9 g/dL — ABNORMAL LOW (ref 13.0–17.0)
Immature Granulocytes: 0 %
Lymphocytes Relative: 12 %
Lymphs Abs: 1 K/uL (ref 0.7–4.0)
MCH: 33.1 pg (ref 26.0–34.0)
MCHC: 34.4 g/dL (ref 30.0–36.0)
MCV: 96.4 fL (ref 80.0–100.0)
Monocytes Absolute: 0.8 K/uL (ref 0.1–1.0)
Monocytes Relative: 10 %
Neutro Abs: 6.4 K/uL (ref 1.7–7.7)
Neutrophils Relative %: 76 %
Platelet Count: 279 K/uL (ref 150–400)
RBC: 3.59 MIL/uL — ABNORMAL LOW (ref 4.22–5.81)
RDW: 14.6 % (ref 11.5–15.5)
WBC Count: 8.3 K/uL (ref 4.0–10.5)
nRBC: 0 % (ref 0.0–0.2)

## 2024-04-23 LAB — T4, FREE: Free T4: 1.11 ng/dL (ref 0.61–1.12)

## 2024-04-23 LAB — LACTATE DEHYDROGENASE: LDH: 125 U/L (ref 98–192)

## 2024-04-23 LAB — TSH: TSH: 2.56 u[IU]/mL (ref 0.350–4.500)

## 2024-04-23 MED ORDER — SODIUM CHLORIDE 0.9 % IV SOLN: INTRAVENOUS | Status: DC

## 2024-04-23 MED ORDER — PREDNISONE 5 MG PO TABS: 5.0000 mg | ORAL_TABLET | Freq: Every day | ORAL | 0 refills | Status: DC

## 2024-04-23 MED ORDER — SODIUM CHLORIDE 0.9 % IV SOLN
240.0000 mg | Freq: Once | INTRAVENOUS | Status: AC
  Administered 2024-04-23: 240 mg via INTRAVENOUS
  Filled 2024-04-23: qty 24

## 2024-04-23 NOTE — Patient Instructions (Signed)
 [facility]  Discharge Instructions: Thank you for choosing [facility] to provide your oncology and hematology care.   If you have a lab appointment with the Cancer Center, please go directly to the Cancer Center and check in at the registration area.   Wear comfortable clothing and clothing appropriate for easy access to any Portacath or PICC line.   We strive to give you quality time with your provider. You may need to reschedule your appointment if you arrive late (15 or more minutes).  Arriving late affects you and other patients whose appointments are after yours.  Also, if you miss three or more appointments without notifying the office, you may be dismissed from the clinic at the provider's discretion.      For prescription refill requests, have your pharmacy contact our office and allow 72 hours for refills to be completed.    Today you received the following chemotherapy and/or immunotherapy agents opdivo      To help prevent nausea and vomiting after your treatment, we encourage you to take your nausea medication as directed.  BELOW ARE SYMPTOMS THAT SHOULD BE REPORTED IMMEDIATELY: *FEVER GREATER THAN 100.4 F (38 C) OR HIGHER *CHILLS OR SWEATING *NAUSEA AND VOMITING THAT IS NOT CONTROLLED WITH YOUR NAUSEA MEDICATION *UNUSUAL SHORTNESS OF BREATH *UNUSUAL BRUISING OR BLEEDING *URINARY PROBLEMS (pain or burning when urinating, or frequent urination) *BOWEL PROBLEMS (unusual diarrhea, constipation, pain near the anus) TENDERNESS IN MOUTH AND THROAT WITH OR WITHOUT PRESENCE OF ULCERS (sore throat, sores in mouth, or a toothache) UNUSUAL RASH, SWELLING OR PAIN  UNUSUAL VAGINAL DISCHARGE OR ITCHING   Items with * indicate a potential emergency and should be followed up as soon as possible or go to the Emergency Department if any problems should occur.  Please show the CHEMOTHERAPY ALERT CARD or IMMUNOTHERAPY  ALERT CARD at check-in to the Emergency Department and triage nurse.  Should you have questions after your visit or need to cancel or reschedule your appointment, please contact [facility] A [facility]  Dept: [phone]  and follow the prompts.  Office hours are 8:00 a.m. to 4:30 p.m. Monday - Friday. Please note that voicemails left after 4:00 p.m. may not be returned until the following business day.  We are closed weekends and major holidays. You have access to a nurse at all times for urgent questions. Please call the main number to the clinic Dept: [phone] and follow the prompts.   For any non-urgent questions, you may also contact your provider using MyChart. We now offer e-Visits for anyone 74 and older to request care online for non-urgent symptoms. For details visit [email or web address].   Also download the MyChart app! Go to the app store, search "MyChart", open the app, select [facility], and log in with your MyChart username and password.

## 2024-04-23 NOTE — Assessment & Plan Note (Signed)
 Prednisone  5 mg once daily.  Prescription sent today.

## 2024-04-23 NOTE — Assessment & Plan Note (Signed)
 Will resume immunotherapy with nivolumab  Monitor clinical signs of worsening immune related side effects Return in 2 weeks with labs visit and treatment

## 2024-04-30 DIAGNOSIS — N401 Enlarged prostate with lower urinary tract symptoms: Secondary | ICD-10-CM | POA: Diagnosis not present

## 2024-04-30 DIAGNOSIS — R338 Other retention of urine: Secondary | ICD-10-CM | POA: Diagnosis not present

## 2024-05-02 ENCOUNTER — Other Ambulatory Visit: Payer: Self-pay | Admitting: Internal Medicine

## 2024-05-05 ENCOUNTER — Telehealth: Payer: Self-pay | Admitting: Internal Medicine

## 2024-05-05 DIAGNOSIS — I10 Essential (primary) hypertension: Secondary | ICD-10-CM

## 2024-05-05 NOTE — Telephone Encounter (Signed)
*  STAT* If patient is at the pharmacy, call can be transferred to refill team.   1. Which medications need to be refilled? (please list name of each medication and dose if known)   valsartan  (DIOVAN ) 80 MG tablet  metoprolol  succinate (TOPROL -XL) 50 MG 24 hr tablet  furosemide  (LASIX ) 40 MG tablet   2. Would you like to learn more about the convenience, safety, & potential cost savings by using the [facility]?   3. Are you open to using the [facility] (Type [facility]. ).  4. Which pharmacy/location (including street and city if local pharmacy) is medication to be sent to?  [id number] - [place], [place] - [address]   5. Do they need a 30 day or 90 day supply?  90 day  Patient stated he is almost out of these medications.

## 2024-05-05 NOTE — Telephone Encounter (Signed)
 Prescription refill request for Eliquis  received. Indication:afib Last office visit:needs appt Scr:na Age: 83 Weight:80.4  kg  Prescription refilled

## 2024-05-06 ENCOUNTER — Other Ambulatory Visit: Payer: Self-pay | Admitting: *Deleted

## 2024-05-06 DIAGNOSIS — C438 Malignant melanoma of overlapping sites of skin: Secondary | ICD-10-CM

## 2024-05-06 DIAGNOSIS — C434 Malignant melanoma of scalp and neck: Secondary | ICD-10-CM

## 2024-05-06 MED ORDER — VALSARTAN 80 MG PO TABS: 80.0000 mg | ORAL_TABLET | Freq: Every day | ORAL | 0 refills | Status: DC

## 2024-05-06 MED ORDER — METOPROLOL SUCCINATE ER 50 MG PO TB24: 50.0000 mg | ORAL_TABLET | Freq: Every day | ORAL | 0 refills | Status: DC

## 2024-05-06 NOTE — Telephone Encounter (Signed)
 Pt's medications were sent to pt's pharmacy as requested. Confirmation received. Pt's medication furosemide  is no longer on pt's medication list and pt is overdue for an appt to be able to get a 90 day supply. Pt has has numerous attempts and this makes the 3rd attempt.

## 2024-05-07 ENCOUNTER — Inpatient Hospital Stay

## 2024-05-07 VITALS — BP 119/64 | HR 67 | Temp 97.5°F | Resp 20 | Wt 179.2 lb

## 2024-05-07 VITALS — BP 120/68 | HR 66 | Temp 97.7°F | Resp 18

## 2024-05-07 DIAGNOSIS — C434 Malignant melanoma of scalp and neck: Secondary | ICD-10-CM | POA: Insufficient documentation

## 2024-05-07 DIAGNOSIS — Z79899 Other long term (current) drug therapy: Secondary | ICD-10-CM | POA: Insufficient documentation

## 2024-05-07 DIAGNOSIS — C7931 Secondary malignant neoplasm of brain: Secondary | ICD-10-CM

## 2024-05-07 DIAGNOSIS — C438 Malignant melanoma of overlapping sites of skin: Secondary | ICD-10-CM

## 2024-05-07 DIAGNOSIS — Z5112 Encounter for antineoplastic immunotherapy: Secondary | ICD-10-CM | POA: Insufficient documentation

## 2024-05-07 DIAGNOSIS — Z9189 Other specified personal risk factors, not elsewhere classified: Secondary | ICD-10-CM | POA: Diagnosis not present

## 2024-05-07 DIAGNOSIS — D539 Nutritional anemia, unspecified: Secondary | ICD-10-CM | POA: Diagnosis not present

## 2024-05-07 DIAGNOSIS — C7800 Secondary malignant neoplasm of unspecified lung: Secondary | ICD-10-CM | POA: Insufficient documentation

## 2024-05-07 LAB — CBC WITH DIFFERENTIAL (CANCER CENTER ONLY)
Abs Immature Granulocytes: 0.03 K/uL (ref 0.00–0.07)
Basophils Absolute: 0 K/uL (ref 0.0–0.1)
Basophils Relative: 0 %
Eosinophils Absolute: 0.1 K/uL (ref 0.0–0.5)
Eosinophils Relative: 1 %
HCT: 33.8 % — ABNORMAL LOW (ref 39.0–52.0)
Hemoglobin: 12.1 g/dL — ABNORMAL LOW (ref 13.0–17.0)
Immature Granulocytes: 0 %
Lymphocytes Relative: 11 %
Lymphs Abs: 0.7 K/uL (ref 0.7–4.0)
MCH: 34.8 pg — ABNORMAL HIGH (ref 26.0–34.0)
MCHC: 35.8 g/dL (ref 30.0–36.0)
MCV: 97.1 fL (ref 80.0–100.0)
Monocytes Absolute: 0.6 K/uL (ref 0.1–1.0)
Monocytes Relative: 9 %
Neutro Abs: 5.5 K/uL (ref 1.7–7.7)
Neutrophils Relative %: 79 %
Platelet Count: 190 K/uL (ref 150–400)
RBC: 3.48 MIL/uL — ABNORMAL LOW (ref 4.22–5.81)
RDW: 16 % — ABNORMAL HIGH (ref 11.5–15.5)
WBC Count: 7 K/uL (ref 4.0–10.5)
nRBC: 0 % (ref 0.0–0.2)

## 2024-05-07 LAB — CMP (CANCER CENTER ONLY)
ALT: 25 U/L (ref 0–44)
AST: 28 U/L (ref 15–41)
Albumin: 4.3 g/dL (ref 3.5–5.0)
Alkaline Phosphatase: 70 U/L (ref 38–126)
Anion gap: 6 (ref 5–15)
BUN: 26 mg/dL — ABNORMAL HIGH (ref 8–23)
CO2: 29 mmol/L (ref 22–32)
Calcium: 9.5 mg/dL (ref 8.9–10.3)
Chloride: 99 mmol/L (ref 98–111)
Creatinine: 1.11 mg/dL (ref 0.61–1.24)
GFR, Estimated: >60 mL/min (ref >60)
Glucose, Bld: 165 mg/dL — ABNORMAL HIGH (ref 70–99)
Potassium: 4.1 mmol/L (ref 3.5–5.1)
Sodium: 134 mmol/L — ABNORMAL LOW (ref 135–145)
Total Bilirubin: 1.3 mg/dL — ABNORMAL HIGH (ref 0.0–1.2)
Total Protein: 7.2 g/dL (ref 6.5–8.1)

## 2024-05-07 LAB — TSH: TSH: 2.12 u[IU]/mL (ref 0.350–4.500)

## 2024-05-07 LAB — LACTATE DEHYDROGENASE: LDH: 140 U/L (ref 98–192)

## 2024-05-07 LAB — T4, FREE: Free T4: 1 ng/dL (ref 0.61–1.12)

## 2024-05-07 MED ORDER — SODIUM CHLORIDE 0.9 % IV SOLN
240.0000 mg | Freq: Once | INTRAVENOUS | Status: AC
  Administered 2024-05-07: 240 mg via INTRAVENOUS
  Filled 2024-05-07: qty 24

## 2024-05-07 MED ORDER — SODIUM CHLORIDE 0.9 % IV SOLN: INTRAVENOUS | Status: DC

## 2024-05-07 NOTE — Progress Notes (Signed)
 [name] OFFICE PROGRESS NOTE  Patient Care Team: [name], MD as PCP - General (Family Medicine) [name], MD as PCP - Electrophysiology (Cardiology) [name], MD (Cardiology) [name], MD as Attending Physician (Urology) [name], MD as Consulting Physician (Dermatology) [name], MD as Consulting Physician (Ophthalmology) [name], MD as Consulting Physician (Gastroenterology) [name], [facility] as Pharmacist (Pharmacist) [name], RN as [facility] Care Management  83 y.o.man undergoing nivolumab  for stage IV melanoma being follow up for metastatic melanoma on treatment.   Previous immune related side effects of diarrhea resolved. Side effect with polyarthritis holding immunotherapy.   Current Diagnosis: Metastatic melanoma with brain and lung metastases.  Suspicious for bone metastases. BRAF negative. 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 (Started 09/06/23) 08/19/23 SRS to 4 brain mets  Last PET showed NED from July 2025.   Arthritis has not recurred.  Clinically well to proceed with treatment today. Assessment & Plan Malignant melanoma of scalp (HCC) Continue immunotherapy with nivolumab  Monitor clinical signs of worsening immune related side effects Return in 2 weeks with labs visit and treatment Malignant melanoma metastatic to brain [facility]) NED from last MRI MRI is planning on 9/18.  Will follow-up. At risk for side effect of medication Monitor with CBC, CMP each visit and H&P for immune related toxicity.  Previously with diarrhea. Resolved. Polyarthritis, resolved. Macrocytic anemia Macrocytic anemia with low B12. Overall improved.  Will continue to monitor.  Continue B12.  Orders Placed This Encounter  Procedures   Lactate dehydrogenase    Standing Status:   Future    Expected Date:   07/02/2024    Expiration Date:   07/02/2025   CBC with  Differential (Cancer Center Only)    Standing Status:   Future    Expected Date:   07/02/2024    Expiration Date:   07/02/2025   CMP (Cancer Center only)    Standing Status:   Future    Expected Date:   07/02/2024    Expiration Date:   07/02/2025   T4, free    Standing Status:   Future    Expected Date:   07/02/2024    Expiration Date:   07/02/2025   TSH    Standing Status:   Future    Expected Date:   07/02/2024    Expiration Date:   07/02/2025   Lactate dehydrogenase    Standing Status:   Future    Expected Date:   07/16/2024    Expiration Date:   07/16/2025   CBC with Differential (Cancer Center Only)    Standing Status:   Future    Expected Date:   07/16/2024    Expiration Date:   07/16/2025   CMP (Cancer Center only)    Standing Status:   Future    Expected Date:   07/16/2024    Expiration Date:   07/16/2025   T4, free    Standing Status:   Future    Expected Date:   07/16/2024    Expiration Date:   07/16/2025   TSH    Standing Status:   Future    Expected Date:   07/16/2024    Expiration Date:   07/16/2025     [name], MD  INTERVAL HISTORY: Patient returns for follow-up.  Currently feeling well.  Was able to play golf yesterday.  No worsening arthritis.  No coughing, short of breath, diarrhea, rashes.  Oncology History  Malignant melanoma (HCC)  01/01/2015 Initial Diagnosis   Malignant melanoma (HCC)   12/2014 Surgery   April 2016 WLE and SLN 01/31/2015: Reexcision    02/25/2018 Imaging   CT CAP Subcentimeter right upper lobe subpleural and left upper lobe perifissural nodules, new from 01/24/2016, indeterminate. Recommend short interval follow-up (3 months), given histor    04/07/2019 Imaging   CT Chest: No evidence of metastatic disease within the chest. Left upper lobe perifissural nodule no longer evident. Right upper lobe subpleural nodule no longer evident.   CT AP No evidence of metastatic disease.     04/2020 Pathology Results   Summer 2021:  New scalp lesion, in transit  path: -Malignant melanoma, involving dermal tissue    04/12/2020 Imaging   CT chest: NED CT AP: NED    05/04/2020 Imaging   CT Neck: No mass or lymphadenopathy within the neck.    05/25/2020 - 07/25/2020 Chemotherapy   5 cycles of TVEC   01/24/2021 PET scan   PET whole body - No abnormal foci of increased radiotracer uptake to suggest metastatic disease.  -Small focus of increased uptake within the proximal sigmoid colon without adjacent stranding or definite wall thickening, indeterminant. Recommend further evaluation with colonoscopy if clinically    08/24/2021 PET scan   PET whole body - No abnormal foci of increased radiotracer uptake to suggest metastatic disease.   - Small focus of increased uptake within the proximal sigmoid colon, similar to 01/24/2021. Recommend clinical correlation and/or further evaluation with colonoscopy if clinically indicated    01/31/2022 Surgery   (Outside case, [phone], 1 slide, 01/23/2022) Skin, left inferior lateral anterior scalp, shave - Metastatic malignant melanoma Size: 2 mm Pattern: Pigmented epithelioid cells forming small plaque in superficial dermis and showing epidermotropism Tumor infiltrating lymphocytes: Brisk Nuclear grade: 3/3 Margins: Focally present adjacent to edge of tissue sections    02/21/2022 - 05/09/2022 Chemotherapy   had an intransit recurrence of melanoma and was retreated with TVEC.  Biopsy in October 2023 showed NED.    05/09/2022 PET scan   - Mildly avid 3 mm right upper lobe subcentimeter pulmonary nodule, possibly increased from 2 mm on prior; recommend attention on follow-up.   -No definite evidence of metabolically active disease.    05/28/2022 Pathology Results   Diagnosis   Skin, Left temple, excision - Dermal fibrosis with lymphocytic and granulomatous inflammation consistent with site of recent therapy - No residual melanoma identified - Margins free of tumor       02/15/2023 PET scan   - Focal area of hypermetabolic activity is seen in the ascending colon, indeterminate. Recommend correlation with colonoscopy.   -Unchanged size of mildly hypermetabolic left paratracheal lymph node, indeterminate. Recommend attention on follow-up imaging.   -Pancreatic calcifications likely reflect sequela of chronic pancreatitis.   -Bladder diverticuli and enlarged prostate.    05/13/2023 PET scan   1.  Evidence of progressive disease as demonstrated by interval enlargement of right upper lobe pulmonary nodule most concerning for metastasis by melanoma. Additional single right pulmonary nodule and 2 sites of focal osseous avidity which are suspicious but indeterminate for additional sites of involvement by metastasis involving the T8 and L1 vertebral bodies.  2.  Focal uptake within the sigmoid colon which has increased in conspicuity over time. Recommend correlation with colonoscopy if not recently completed with differential diagnosis including adenoma, carcinoma, and inflammatory lesion (given associated coarse calcifications/high density material). No evidence of complication (i.e.  fluid collection, or free air) by low-dose  unenhanced CT.  3.  Persistent FDG abnormality of the left anterior shin associated with a dermal nodule. Recommend direct inspection as additional site of cutaneous neoplasm is not excluded.    06/14/2023 Pathology Results   A: Lung, right upper lobe, lobectomy  - Metastatic melanoma, multifocal (largest focus 2.4 cm, with two sub-centimeter nodules also identified microscopically, each 3 mm) - Surgical margins negative  - BRAF (VE1): Negative - TILs: brisk - Two lymph nodes, negative for malignancy (0/2)   07/17/2023 Genetic Testing   [facility]: pertinent negatives: BRAF. KIT TERT C.-124C>T variant- promoter mutation NRAS p.G13D Missense variant (exon 2) - GOF TP53 p.R342P ARID1A p.G285fs NF1 p.R1362* PHLPP2 p.Q300* NF1  p.K191_R192delinsN*  Multiple VUS findings.   08/05/2023 Imaging   MRI brain  --Single enhancing right occipital cortical lesion is most concerning for intracranial metastatic disease.   --Indeterminate 7 mm enhancing lesion in the right posterior parietal bone. There is no definite FDG avidity in this region on the recent PET/CT, arguing against malignancy, however close attention to this area on follow-up imaging is recommended.    08/05/2023 Imaging   CT chest IMPRESSION:   -Right upper lobectomy with moderate sized loculated right pleural effusion. New 0.3 cm right lower lobe micronodule. Recommend attention on follow-up CT in 3 months.   Few subcentimeter right infra-axillary lymph nodes with adjacent fat stranding and benign morphology, new since prior. Recommendation follow-up CT.   Markedly enlarged left atrium and mild qualitative dilatation of right atrium. Extensive coronary artery calcifications.    09/06/2023 -  Chemotherapy   Patient is on Treatment Plan : METASTATIC MELANOMA Nivolumab  (240) q14d     09/19/2023 Cancer Staging   Staging form: Melanoma of the Skin, AJCC 7th Edition - Clinical: Stage IV (TX, N0, M1c) - Signed by Tina [name], MD on 09/19/2023 Biopsy of metastatic site performed: Yes Source of metastatic specimen: Lung   11/19/2023 Imaging   MRI brain: Positive treatment response, previously seen brain lesions are no longer detectable. No new lesion.   11/21/2023 PET scan   PET: after 6C of nivo every 2 weeks. Marked response to therapy, including resolution of hepatic and osseous hypermetabolism.     PHYSICAL EXAMINATION: ECOG PERFORMANCE STATUS: 1 - Symptomatic but completely ambulatory  Vitals:   05/07/24 1118  BP: 119/64  Pulse: 67  Resp: 20  Temp: (!) 97.5 F (36.4 C)  SpO2: 99%   Filed Weights   05/07/24 1118  Weight: 179 lb 3.2 oz (81.3 kg)    GENERAL: alert, no distress and comfortable SKIN: skin color normal and no jaundice  NECK:  No palpable mass LYMPH:  no palpable cervical, axillary lymphadenopathy  LUNGS: clear to auscultation and percussion with normal breathing effort HEART: regular rate & rhythm  ABDOMEN: abdomen soft, non-tender and nondistended. Musculoskeletal: no edema NEURO: no focal motor/sensory deficits  Relevant data reviewed during this visit included labs.

## 2024-05-07 NOTE — Assessment & Plan Note (Addendum)
 NED from last MRI MRI is planning on 9/18.  Will follow-up.

## 2024-05-07 NOTE — Assessment & Plan Note (Addendum)
 Macrocytic anemia with low B12. Overall improved.  Will continue to monitor.  Continue B12.

## 2024-05-07 NOTE — Assessment & Plan Note (Addendum)
 Continue immunotherapy with nivolumab  Monitor clinical signs of worsening immune related side effects Return in 2 weeks with labs visit and treatment

## 2024-05-07 NOTE — Assessment & Plan Note (Addendum)
 Monitor with CBC, CMP each visit and H&P for immune related toxicity.  Previously with diarrhea. Resolved. Polyarthritis, resolved.

## 2024-05-07 NOTE — Patient Instructions (Addendum)
 [facility] - A DEPT OF [facility]  Discharge Instructions: Thank you for choosing [facility] to provide your oncology and hematology care.   If you have a lab appointment with the Cancer Center, please go directly to the Cancer Center and check in at the registration area.   Wear comfortable clothing and clothing appropriate for easy access to any Portacath or PICC line.   We strive to give you quality time with your provider. You may need to reschedule your appointment if you arrive late (15 or more minutes).  Arriving late affects you and other patients whose appointments are after yours.  Also, if you miss three or more appointments without notifying the office, you may be dismissed from the clinic at the provider's discretion.      For prescription refill requests, have your pharmacy contact our office and allow 72 hours for refills to be completed.    Today you received the following chemotherapy and/or immunotherapy agents: Nivolumab (Opdivo)      To help prevent nausea and vomiting after your treatment, we encourage you to take your nausea medication as directed.  BELOW ARE SYMPTOMS THAT SHOULD BE REPORTED IMMEDIATELY: *FEVER GREATER THAN 100.4 F (38 C) OR HIGHER *CHILLS OR SWEATING *NAUSEA AND VOMITING THAT IS NOT CONTROLLED WITH YOUR NAUSEA MEDICATION *UNUSUAL SHORTNESS OF BREATH *UNUSUAL BRUISING OR BLEEDING *URINARY PROBLEMS (pain or burning when urinating, or frequent urination) *BOWEL PROBLEMS (unusual diarrhea, constipation, pain near the anus) TENDERNESS IN MOUTH AND THROAT WITH OR WITHOUT PRESENCE OF ULCERS (sore throat, sores in mouth, or a toothache) UNUSUAL RASH, SWELLING OR PAIN  UNUSUAL VAGINAL DISCHARGE OR ITCHING   Items with * indicate a potential emergency and should be followed up as soon as possible or go to the Emergency Department if any problems should occur.  Please show the CHEMOTHERAPY ALERT CARD or  IMMUNOTHERAPY ALERT CARD at check-in to the Emergency Department and triage nurse.  Should you have questions after your visit or need to cancel or reschedule your appointment, please contact [facility] - A DEPT OF [facility]  Dept: [phone]  and follow the prompts.  Office hours are 8:00 a.m. to 4:30 p.m. Monday - Friday. Please note that voicemails left after 4:00 p.m. may not be returned until the following business day.  We are closed weekends and major holidays. You have access to a nurse at all times for urgent questions. Please call the main number to the clinic Dept: [phone] and follow the prompts.   For any non-urgent questions, you may also contact your provider using MyChart. We now offer e-Visits for anyone 57 and older to request care online for non-urgent symptoms. For details visit [email or web address].   Also download the MyChart app! Go to the app store, search "MyChart", open the app, select [facility], and log in with your MyChart username and password.  Nivolumab Injection What is this medication? NIVOLUMAB (nye VOL ue mab) treats some types of cancer. It works by helping your immune system slow or stop the spread of cancer cells. It is a monoclonal antibody. This medicine may be used for other purposes; ask your health care provider or pharmacist if you have questions. COMMON BRAND NAME(S): Opdivo What should I tell my care team before I take this medication? They need to know if you have any of these conditions: Allogeneic stem cell transplant (uses someone else's stem cells)  Autoimmune diseases, such as Crohn disease, ulcerative colitis, lupus History of chest radiation Nervous system problems, such as Guillain-Barre syndrome or myasthenia gravis Organ transplant An unusual or allergic reaction to nivolumab, other medications, foods, dyes, or preservatives Pregnant or trying to get pregnant Breast-feeding How should I use  this medication? This medication is infused into a vein. It is given in a hospital or clinic setting. A special MedGuide will be given to you before each treatment. Be sure to read this information carefully each time. Talk to your care team about the use of this medication in children. While it may be prescribed for children as young as 12 years for selected conditions, precautions do apply. Overdosage: If you think you have taken too much of this medicine contact a poison control center or emergency room at once. NOTE: This medicine is only for you. Do not share this medicine with others. What if I miss a dose? Keep appointments for follow-up doses. It is important not to miss your dose. Call your care team if you are unable to keep an appointment. What may interact with this medication? Interactions have not been studied. This list may not describe all possible interactions. Give your health care provider a list of all the medicines, herbs, non-prescription drugs, or dietary supplements you use. Also tell them if you smoke, drink alcohol, or use illegal drugs. Some items may interact with your medicine. What should I watch for while using this medication? Your condition will be monitored carefully while you are receiving this medication. You may need blood work while taking this medication. This medication may cause serious skin reactions. They can happen weeks to months after starting the medication. Contact your care team right away if you notice fevers or flu-like symptoms with a rash. The rash may be red or purple and then turn into blisters or peeling of the skin. You may also notice a red rash with swelling of the face, lips, or lymph nodes in your neck or under your arms. Tell your care team right away if you have any change in your eyesight. Talk to your care team if you are pregnant or think you might be pregnant. A negative pregnancy test is required before starting this medication. A  reliable form of contraception is recommended while taking this medication and for 5 months after the last dose. Talk to your care team about effective forms of contraception. Do not breast-feed while taking this medication and for 5 months after the last dose. What side effects may I notice from receiving this medication? Side effects that you should report to your care team as soon as possible: Allergic reactions--skin rash, itching, hives, swelling of the face, lips, tongue, or throat Dry cough, shortness of breath or trouble breathing Eye pain, redness, irritation, or discharge with blurry or decreased vision Heart muscle inflammation--unusual weakness or fatigue, shortness of breath, chest pain, fast or irregular heartbeat, dizziness, swelling of the ankles, feet, or hands Hormone gland problems--headache, sensitivity to light, unusual weakness or fatigue, dizziness, fast or irregular heartbeat, increased sensitivity to cold or heat, excessive sweating, constipation, hair loss, increased thirst or amount of urine, tremors or shaking, irritability Infusion reactions--chest pain, shortness of breath or trouble breathing, feeling faint or lightheaded Kidney injury (glomerulonephritis)--decrease in the amount of urine, red or dark brown urine, foamy or bubbly urine, swelling of the ankles, hands, or feet Liver injury--right upper belly pain, loss of appetite, nausea, light-colored stool, dark yellow or brown urine, yellowing skin  or eyes, unusual weakness or fatigue Pain, tingling, or numbness in the hands or feet, muscle weakness, change in vision, confusion or trouble speaking, loss of balance or coordination, trouble walking, seizures Rash, fever, and swollen lymph nodes Redness, blistering, peeling, or loosening of the skin, including inside the mouth Sudden or severe stomach pain, bloody diarrhea, fever, nausea, vomiting Side effects that usually do not require medical attention (report these  to your care team if they continue or are bothersome): Bone, joint, or muscle pain Diarrhea Fatigue Loss of appetite Nausea Skin rash This list may not describe all possible side effects. Call your doctor for medical advice about side effects. You may report side effects to FDA at 1-800-FDA-1088. Where should I keep my medication? This medication is given in a hospital or clinic. It will not be stored at home. NOTE: This sheet is a summary. It may not cover all possible information. If you have questions about this medicine, talk to your doctor, pharmacist, or health care provider.  2024 Elsevier/Gold Standard (2021-12-18 00:00:00)

## 2024-05-13 ENCOUNTER — Other Ambulatory Visit: Payer: Self-pay | Admitting: Student

## 2024-05-13 DIAGNOSIS — I10 Essential (primary) hypertension: Secondary | ICD-10-CM

## 2024-05-20 ENCOUNTER — Encounter: Payer: Self-pay | Admitting: Pulmonary Disease

## 2024-05-20 ENCOUNTER — Ambulatory Visit: Attending: Cardiology | Admitting: Pulmonary Disease

## 2024-05-20 VITALS — BP 128/70 | HR 82 | Resp 16 | Ht 71.0 in | Wt 178.0 lb

## 2024-05-20 DIAGNOSIS — D6869 Other thrombophilia: Secondary | ICD-10-CM | POA: Diagnosis not present

## 2024-05-20 DIAGNOSIS — I4821 Permanent atrial fibrillation: Secondary | ICD-10-CM

## 2024-05-20 DIAGNOSIS — I10 Essential (primary) hypertension: Secondary | ICD-10-CM | POA: Diagnosis not present

## 2024-05-20 MED ORDER — AMLODIPINE BESYLATE 2.5 MG PO TABS: 2.5000 mg | ORAL_TABLET | Freq: Every day | ORAL | Status: DC

## 2024-05-20 NOTE — Progress Notes (Signed)
  Electrophysiology Office Note:   Date:  05/20/2024  ID:  Nicholas Bird, DOB 1940/09/27, MRN [id number]  Primary Cardiologist: None Primary Heart Failure: None Electrophysiologist: Elspeth Sage, MD      History of Present Illness:   Nicholas Bird is a 83 y.o. 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   Since last being seen in our clinic the patient reports doing well overall. He remains unaware of his AF.  No bleeding issues on OAC.   He denies chest pain, palpitations, dyspnea, PND, orthopnea, nausea, vomiting, dizziness, syncope, edema, weight gain, or early satiety.   Review of systems complete and found to be negative unless listed in HPI.   EP Information / Studies Reviewed:    EKG is ordered today. Personal review as below.  EKG Interpretation Date/Time:  Wednesday May 20 2024 11:05:46 EDT Ventricular Rate:  62 PR Interval:    QRS Duration:  86 QT Interval:  426 QTC Calculation: 432 R Axis:   109  Text Interpretation: Atrial fibrillation Confirmed by [name] ([id number]) on 05/20/2024 11:55:10 AM    Arrhythmia / AAD / Pertinent EP Studies AF    Risk Assessment/Calculations:    CHA2DS2-VASc Score = 5   This indicates a 7.2% annual risk of stroke. The patient's score is based upon: CHF History: 0 HTN History: 1 Diabetes History: 0 Stroke History: 2 Vascular Disease History: 0 Age Score: 2 Gender Score: 0             Physical Exam:   VS:  BP 128/70 (BP Location: Left Arm, Patient Position: Sitting, Cuff Size: Normal)   Pulse 82   Resp 16   Ht 5' 11 (1.803 m)   Wt 178 lb (80.7 kg)   SpO2 99%   BMI 24.83 kg/m    Wt Readings from Last 3 Encounters:  05/20/24 178 lb (80.7 kg)  05/07/24 179 lb 3.2 oz (81.3 kg)  04/23/24 177 lb 4.8 oz (80.4 kg)     GEN: pleasant, elderly male, well nourished, well developed in no acute distress NECK: No JVD; No carotid  bruits CARDIAC: Irregularly irregular rate and rhythm, no murmurs, rubs, gallops RESPIRATORY:  Clear to auscultation without rales, wheezing or rhonchi  ABDOMEN: Soft, non-tender, non-distended EXTREMITIES:  No edema; No deformity   ASSESSMENT AND PLAN:    Permanent AF  CHA2DS2-VASc 5 -OAC for stroke prophylaxis  -continue Toprol  50 mg daily   Secondary Hypercoagulable State  -continue Eliquis  5mg  BID, dose reviewed and appropriate by wt / Cr  Hypertension  -well controlled on current regimen  -he states he has discussed with PCP about coming off norvasc  due to LE swelling > we reviewed stopping it and using his PRN lasix . Patient instructed to check BP at home and notify his PCP if his SBP is consistently > 140, he indicates understanding  Hx TIA  -per primary   Metastatic Melanoma  Anemia  -per HEME/ONC   Follow up with Dr. Almetta in 12 months > discussed transition to new EP   Signed, Nicholas Aniceto, NP-C, AGACNP-BC [facility] - Electrophysiology  05/20/2024, 2:43 PM

## 2024-05-20 NOTE — Patient Instructions (Signed)
 Medication Instructions:   STOP TAKING AND REMOVE THIS MEDICATION FROM YOUR MEDICATION LIST:    NORVASC      *If you need a refill on your cardiac medications before your next appointment, please call your pharmacy*    Lab Work: NONE ORDERED  TODAY     If you have labs (blood work) drawn today and your tests are completely normal, you will receive your results only by: MyChart Message (if you have MyChart) OR A paper copy in the mail If you have any lab test that is abnormal or we need to change your treatment, we will call you to review the results.     Testing/Procedures: NONE ORDERED  TODAY      Follow-Up: At [facility], you and your health needs are our priority.  As part of our continuing mission to provide you with exceptional heart care, our providers are all part of one team.  This team includes your primary Cardiologist (physician) and Advanced Practice Providers or APPs (Physician Assistants and Nurse Practitioners) who all work together to provide you with the care you need, when you need it.   Your next appointment:     1 year(s)   Provider:     Donnice Primus, MD     We recommend signing up for the patient portal called MyChart.  Sign up information is provided on this After Visit Summary.  MyChart is used to connect with patients for Virtual Visits (Telemedicine).  Patients are able to view lab/test results, encounter notes, upcoming appointments, etc.  Non-urgent messages can be sent to your provider as well.   To learn more about what you can do with MyChart, go to [email or web address].   Other Instructions     KEEP A MONITOR OF BLOOD PRESSURE

## 2024-05-21 ENCOUNTER — Ambulatory Visit
Admission: RE | Admit: 2024-05-21 | Discharge: 2024-05-21 | Disposition: A | Discharge status: Home / Self Care | Source: Ambulatory Visit | Attending: Radiation Oncology | Admitting: Radiation Oncology

## 2024-05-21 ENCOUNTER — Ambulatory Visit: Payer: Self-pay | Admitting: Radiation Oncology

## 2024-05-21 DIAGNOSIS — C7931 Secondary malignant neoplasm of brain: Secondary | ICD-10-CM | POA: Diagnosis not present

## 2024-05-21 MED ORDER — GADOPICLENOL 0.5 MMOL/ML IV SOLN
8.0000 mL | Freq: Once | INTRAVENOUS | Status: AC | PRN
  Administered 2024-05-21: 8 mL via INTRAVENOUS

## 2024-05-21 NOTE — Progress Notes (Unsigned)
 [facility] OFFICE PROGRESS NOTE  Patient Care Team: [name], MD as PCP - General (Family Medicine) [name], MD as PCP - Electrophysiology (Cardiology) [name], MD (Cardiology) [name], MD as Attending Physician (Urology) [name], MD as Consulting Physician (Dermatology) [name], MD as Consulting Physician (Ophthalmology) [name], MD as Consulting Physician (Gastroenterology) [name], [facility] as Pharmacist (Pharmacist) [name], RN as [facility] Care Management  83 y.o.man undergoing nivolumab  for stage IV melanoma being follow up for metastatic melanoma on treatment.    Previous immune related side effects of diarrhea resolved. Side effect with polyarthritis holding immunotherapy.    Current Diagnosis: Metastatic melanoma with brain and lung metastases.  Suspicious for bone metastases. BRAF negative. 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 (Started 09/06/23) 08/19/23 SRS to 4 brain mets   Last PET showed NED from July 2025.  Assessment & Plan Melanoma of scalp (HCC) Continue immunotherapy with nivolumab  Monitor clinical signs of worsening immune related side effects Return in 2 weeks with labs visit and treatment  No orders of the defined types were placed in this encounter.    [name], MD  INTERVAL HISTORY: Patient returns for follow-up.  Oncology History  Malignant melanoma (HCC)  01/01/2015 Initial Diagnosis   Malignant melanoma (HCC)   12/2014 Surgery   April 2016 WLE and SLN 01/31/2015: Reexcision    02/25/2018 Imaging   CT CAP Subcentimeter right upper lobe subpleural and left upper lobe perifissural nodules, new from 01/24/2016, indeterminate. Recommend short interval follow-up (3 months), given histor    04/07/2019 Imaging   CT Chest: No evidence of metastatic disease within the chest. Left upper lobe perifissural nodule no longer  evident. Right upper lobe subpleural nodule no longer evident.   CT AP No evidence of metastatic disease.     04/2020 Pathology Results   Summer 2021: New scalp lesion, in transit  path: -Malignant melanoma, involving dermal tissue    04/12/2020 Imaging   CT chest: NED CT AP: NED    05/04/2020 Imaging   CT Neck: No mass or lymphadenopathy within the neck.    05/25/2020 - 07/25/2020 Chemotherapy   5 cycles of TVEC   01/24/2021 PET scan   PET whole body - No abnormal foci of increased radiotracer uptake to suggest metastatic disease.  -Small focus of increased uptake within the proximal sigmoid colon without adjacent stranding or definite wall thickening, indeterminant. Recommend further evaluation with colonoscopy if clinically    08/24/2021 PET scan   PET whole body - No abnormal foci of increased radiotracer uptake to suggest metastatic disease.   - Small focus of increased uptake within the proximal sigmoid colon, similar to 01/24/2021. Recommend clinical correlation and/or further evaluation with colonoscopy if clinically indicated    01/31/2022 Surgery   (Outside case, [phone], 1 slide, 01/23/2022) Skin, left inferior lateral anterior scalp, shave - Metastatic malignant melanoma Size: 2 mm Pattern: Pigmented epithelioid cells forming small plaque in superficial dermis and showing epidermotropism Tumor infiltrating lymphocytes: Brisk Nuclear grade: 3/3 Margins: Focally present adjacent to edge of tissue sections    02/21/2022 - 05/09/2022 Chemotherapy   had an intransit recurrence of melanoma and was retreated with TVEC.  Biopsy in October 2023 showed NED.    05/09/2022 PET scan   - Mildly avid 3 mm right upper lobe subcentimeter pulmonary nodule, possibly increased from 2 mm on prior; recommend attention on follow-up.   -No definite evidence  of metabolically active disease.    05/28/2022 Pathology Results   Diagnosis   Skin, Left temple, excision - Dermal fibrosis with  lymphocytic and granulomatous inflammation consistent with site of recent therapy - No residual melanoma identified - Margins free of tumor      02/15/2023 PET scan   - Focal area of hypermetabolic activity is seen in the ascending colon, indeterminate. Recommend correlation with colonoscopy.   -Unchanged size of mildly hypermetabolic left paratracheal lymph node, indeterminate. Recommend attention on follow-up imaging.   -Pancreatic calcifications likely reflect sequela of chronic pancreatitis.   -Bladder diverticuli and enlarged prostate.    05/13/2023 PET scan   1.  Evidence of progressive disease as demonstrated by interval enlargement of right upper lobe pulmonary nodule most concerning for metastasis by melanoma. Additional single right pulmonary nodule and 2 sites of focal osseous avidity which are suspicious but indeterminate for additional sites of involvement by metastasis involving the T8 and L1 vertebral bodies.  2.  Focal uptake within the sigmoid colon which has increased in conspicuity over time. Recommend correlation with colonoscopy if not recently completed with differential diagnosis including adenoma, carcinoma, and inflammatory lesion (given associated coarse calcifications/high density material). No evidence of complication (i.e.  fluid collection, or free air) by low-dose unenhanced CT.  3.  Persistent FDG abnormality of the left anterior shin associated with a dermal nodule. Recommend direct inspection as additional site of cutaneous neoplasm is not excluded.    06/14/2023 Pathology Results   A: Lung, right upper lobe, lobectomy  - Metastatic melanoma, multifocal (largest focus 2.4 cm, with two sub-centimeter nodules also identified microscopically, each 3 mm) - Surgical margins negative  - BRAF (VE1): Negative - TILs: brisk - Two lymph nodes, negative for malignancy (0/2)   07/17/2023 Genetic Testing   [place]us: pertinent negatives: BRAF. KIT TERT C.-124C>T variant-  promoter mutation NRAS p.G13D Missense variant (exon 2) - GOF TP53 p.R342P ARID1A p.G285fs NF1 p.R1362* PHLPP2 p.Q300* NF1 p.K191_R192delinsN*  Multiple VUS findings.   08/05/2023 Imaging   MRI brain  --Single enhancing right occipital cortical lesion is most concerning for intracranial metastatic disease.   --Indeterminate 7 mm enhancing lesion in the right posterior parietal bone. There is no definite FDG avidity in this region on the recent PET/CT, arguing against malignancy, however close attention to this area on follow-up imaging is recommended.    08/05/2023 Imaging   CT chest IMPRESSION:   -Right upper lobectomy with moderate sized loculated right pleural effusion. New 0.3 cm right lower lobe micronodule. Recommend attention on follow-up CT in 3 months.   Few subcentimeter right infra-axillary lymph nodes with adjacent fat stranding and benign morphology, new since prior. Recommendation follow-up CT.   Markedly enlarged left atrium and mild qualitative dilatation of right atrium. Extensive coronary artery calcifications.    09/06/2023 -  Chemotherapy   Patient is on Treatment Plan : METASTATIC MELANOMA Nivolumab  (240) q14d     09/19/2023 Cancer Staging   Staging form: Melanoma of the Skin, AJCC 7th Edition - Clinical: Stage IV (TX, N0, M1c) - Signed by Tina [name], MD on 09/19/2023 Biopsy of metastatic site performed: Yes Source of metastatic specimen: Lung   11/19/2023 Imaging   MRI brain: Positive treatment response, previously seen brain lesions are no longer detectable. No new lesion.   11/21/2023 PET scan   PET: after 6C of nivo every 2 weeks. Marked response to therapy, including resolution of hepatic and osseous hypermetabolism.      PHYSICAL EXAMINATION: ECOG  PERFORMANCE STATUS: {CHL ONC ECOG PS:[phone]}  There were no vitals filed for this visit. There were no vitals filed for this visit.  GENERAL: alert, no distress and comfortable SKIN: skin color  normal and no jaundice or bruising or petechiae on exposed skin EYES: normal, sclera clear OROPHARYNX: no exudate  NECK: No palpable mass LYMPH:  no palpable cervical, axillary lymphadenopathy  LUNGS: clear to auscultation and no wheeze or rales with normal breathing effort HEART: regular rate & rhythm  ABDOMEN: abdomen soft, non-tender and nondistended. Musculoskeletal: no edema NEURO: no focal motor/sensory deficits  Relevant data reviewed during this visit included labs.  New labs ordered.

## 2024-05-21 NOTE — Assessment & Plan Note (Addendum)
 Continue immunotherapy with nivolumab  Monitor clinical signs of worsening immune related side effects Return in 2 weeks with labs visit and treatment

## 2024-05-22 ENCOUNTER — Inpatient Hospital Stay

## 2024-05-22 VITALS — BP 142/81 | HR 67 | Temp 98.3°F | Resp 17

## 2024-05-22 DIAGNOSIS — Z79899 Other long term (current) drug therapy: Secondary | ICD-10-CM | POA: Diagnosis not present

## 2024-05-22 DIAGNOSIS — M130 Polyarthritis, unspecified: Secondary | ICD-10-CM

## 2024-05-22 DIAGNOSIS — C7800 Secondary malignant neoplasm of unspecified lung: Secondary | ICD-10-CM | POA: Diagnosis not present

## 2024-05-22 DIAGNOSIS — C434 Malignant melanoma of scalp and neck: Secondary | ICD-10-CM

## 2024-05-22 DIAGNOSIS — Z5112 Encounter for antineoplastic immunotherapy: Secondary | ICD-10-CM | POA: Diagnosis not present

## 2024-05-22 DIAGNOSIS — C7931 Secondary malignant neoplasm of brain: Secondary | ICD-10-CM | POA: Diagnosis not present

## 2024-05-22 LAB — CMP (CANCER CENTER ONLY)
ALT: 41 U/L (ref 0–44)
AST: 38 U/L (ref 15–41)
Albumin: 4.3 g/dL (ref 3.5–5.0)
Alkaline Phosphatase: 82 U/L (ref 38–126)
Anion gap: 7 (ref 5–15)
BUN: 23 mg/dL (ref 8–23)
CO2: 27 mmol/L (ref 22–32)
Calcium: 9.5 mg/dL (ref 8.9–10.3)
Chloride: 96 mmol/L — ABNORMAL LOW (ref 98–111)
Creatinine: 1.28 mg/dL — ABNORMAL HIGH (ref 0.61–1.24)
GFR, Estimated: 56 mL/min — ABNORMAL LOW (ref >60)
Glucose, Bld: 125 mg/dL — ABNORMAL HIGH (ref 70–99)
Potassium: 3.8 mmol/L (ref 3.5–5.1)
Sodium: 130 mmol/L — ABNORMAL LOW (ref 135–145)
Total Bilirubin: 0.6 mg/dL (ref 0.0–1.2)
Total Protein: 7.1 g/dL (ref 6.5–8.1)

## 2024-05-22 LAB — CBC WITH DIFFERENTIAL (CANCER CENTER ONLY)
Abs Immature Granulocytes: 0.09 K/uL — ABNORMAL HIGH (ref 0.00–0.07)
Basophils Absolute: 0.1 K/uL (ref 0.0–0.1)
Basophils Relative: 1 %
Eosinophils Absolute: 0.1 K/uL (ref 0.0–0.5)
Eosinophils Relative: 2 %
HCT: 33 % — ABNORMAL LOW (ref 39.0–52.0)
Hemoglobin: 11.8 g/dL — ABNORMAL LOW (ref 13.0–17.0)
Immature Granulocytes: 1 %
Lymphocytes Relative: 16 %
Lymphs Abs: 1.2 K/uL (ref 0.7–4.0)
MCH: 35 pg — ABNORMAL HIGH (ref 26.0–34.0)
MCHC: 35.8 g/dL (ref 30.0–36.0)
MCV: 97.9 fL (ref 80.0–100.0)
Monocytes Absolute: 0.8 K/uL (ref 0.1–1.0)
Monocytes Relative: 11 %
Neutro Abs: 5.1 K/uL (ref 1.7–7.7)
Neutrophils Relative %: 69 %
Platelet Count: 195 K/uL (ref 150–400)
RBC: 3.37 MIL/uL — ABNORMAL LOW (ref 4.22–5.81)
RDW: 15.6 % — ABNORMAL HIGH (ref 11.5–15.5)
WBC Count: 7.3 K/uL (ref 4.0–10.5)
nRBC: 0 % (ref 0.0–0.2)

## 2024-05-22 LAB — TSH: TSH: 2.22 u[IU]/mL (ref 0.350–4.500)

## 2024-05-22 LAB — LACTATE DEHYDROGENASE: LDH: 138 U/L (ref 98–192)

## 2024-05-22 LAB — C-REACTIVE PROTEIN: CRP: 0.6 mg/dL (ref <1.0)

## 2024-05-22 LAB — T4, FREE: Free T4: 0.99 ng/dL (ref 0.61–1.12)

## 2024-05-22 MED ORDER — SODIUM CHLORIDE 0.9 % IV SOLN
240.0000 mg | Freq: Once | INTRAVENOUS | Status: AC
  Administered 2024-05-22: 240 mg via INTRAVENOUS
  Filled 2024-05-22: qty 24

## 2024-05-22 MED ORDER — SODIUM CHLORIDE 0.9 % IV SOLN: INTRAVENOUS | Status: DC

## 2024-05-22 NOTE — Patient Instructions (Signed)
 [facility] - A DEPT OF [facility]  Discharge Instructions: Thank you for choosing [facility] to provide your oncology and hematology care.   If you have a lab appointment with the Cancer Center, please go directly to the Cancer Center and check in at the registration area.   Wear comfortable clothing and clothing appropriate for easy access to any Portacath or PICC line.   We strive to give you quality time with your provider. You may need to reschedule your appointment if you arrive late (15 or more minutes).  Arriving late affects you and other patients whose appointments are after yours.  Also, if you miss three or more appointments without notifying the office, you may be dismissed from the clinic at the provider's discretion.      For prescription refill requests, have your pharmacy contact our office and allow 72 hours for refills to be completed.    Today you received the following chemotherapy and/or immunotherapy agents: nivolumab (OPDIVO)       To help prevent nausea and vomiting after your treatment, we encourage you to take your nausea medication as directed.  BELOW ARE SYMPTOMS THAT SHOULD BE REPORTED IMMEDIATELY: *FEVER GREATER THAN 100.4 F (38 C) OR HIGHER *CHILLS OR SWEATING *NAUSEA AND VOMITING THAT IS NOT CONTROLLED WITH YOUR NAUSEA MEDICATION *UNUSUAL SHORTNESS OF BREATH *UNUSUAL BRUISING OR BLEEDING *URINARY PROBLEMS (pain or burning when urinating, or frequent urination) *BOWEL PROBLEMS (unusual diarrhea, constipation, pain near the anus) TENDERNESS IN MOUTH AND THROAT WITH OR WITHOUT PRESENCE OF ULCERS (sore throat, sores in mouth, or a toothache) UNUSUAL RASH, SWELLING OR PAIN  UNUSUAL VAGINAL DISCHARGE OR ITCHING   Items with * indicate a potential emergency and should be followed up as soon as possible or go to the Emergency Department if any problems should occur.  Please show the CHEMOTHERAPY ALERT CARD or  IMMUNOTHERAPY ALERT CARD at check-in to the Emergency Department and triage nurse.  Should you have questions after your visit or need to cancel or reschedule your appointment, please contact [facility] - A DEPT OF [facility]  Dept: [phone]  and follow the prompts.  Office hours are 8:00 a.m. to 4:30 p.m. Monday - Friday. Please note that voicemails left after 4:00 p.m. may not be returned until the following business day.  We are closed weekends and major holidays. You have access to a nurse at all times for urgent questions. Please call the main number to the clinic Dept: [phone] and follow the prompts.   For any non-urgent questions, you may also contact your provider using MyChart. We now offer e-Visits for anyone 3 and older to request care online for non-urgent symptoms. For details visit [email or web address].   Also download the MyChart app! Go to the app store, search "MyChart", open the app, select [place], and log in with your MyChart username and password.

## 2024-05-25 ENCOUNTER — Inpatient Hospital Stay

## 2024-05-27 ENCOUNTER — Encounter: Payer: Self-pay | Admitting: Urology

## 2024-05-27 ENCOUNTER — Ambulatory Visit
Admission: RE | Admit: 2024-05-27 | Discharge: 2024-05-27 | Disposition: A | Discharge status: Home / Self Care | Source: Ambulatory Visit | Attending: Urology | Admitting: Urology

## 2024-05-27 VITALS — Wt 178.0 lb

## 2024-05-27 DIAGNOSIS — C7931 Secondary malignant neoplasm of brain: Secondary | ICD-10-CM | POA: Diagnosis not present

## 2024-05-27 DIAGNOSIS — C434 Malignant melanoma of scalp and neck: Secondary | ICD-10-CM | POA: Diagnosis not present

## 2024-05-27 NOTE — Progress Notes (Signed)
 Telephone follow up to discuss results of Brain MRI.  Completed his radiation on: 08/19/2023  Does the patient complain of any of the following:  Headache: No Visual Changes: No Hearing Changes: No Nausea: No Vomiting: No Balance or coordination issues: No Memory issues: No  Is the patient currently on a Decadron  regimen? : No  Additional comments if applicable:  None

## 2024-05-29 ENCOUNTER — Other Ambulatory Visit: Payer: Self-pay | Admitting: Radiation Therapy

## 2024-05-29 DIAGNOSIS — C7931 Secondary malignant neoplasm of brain: Secondary | ICD-10-CM

## 2024-05-29 NOTE — Progress Notes (Signed)
 Radiation Oncology         [phone] ________________________________  Name: Nicholas Bird MRN: [id number]  Date: 05/27/2024  DOB: 10/29/1940  Post Treatment Note  CC: [name], MD  [name], MD  Diagnosis:   83 year old man with brain metastases secondary to stage IV malignant melanoma   Interval Since Last Radiation:  9 months  08/19/23:  The 4 targets in the brain were treated to 20 Gy in a single fraction of SRS.  Narrative:  I spoke with the patient to conduct his routine scheduled 3 month follow up visit to review the results of his recent post-treatment MRI brain scan via telephone to spare the patient unnecessary potential exposure in the healthcare setting during the current COVID-19 pandemic.  The patient was notified in advance and gave permission to proceed with this visit format.  He tolerated the treatment well and was discharged home in stable condition.  He has continued without complaints. His initial post-treatment MRI brain scan from 05/21/24 continues to show an excellent treatment response with resolution of the previously treated lesions and no definite evidence of new lesions.  There is a new, 8 mm linear enhancement in the right temporoparietal region that is felt most likely to represent a small infarct. We reviewed the results today by telephone.  His restaging PET scan from 11/21/23 also confirmed an excellent treatment response to his systemic immunotherapy with resolution of the hypermetabolic activity in the liver and bone lesions.  He remains on nivolumab  immunotherapy, started 09/06/2023, under the care of Dr. Tina.  His most recent restaging PET imaging from July 2025 did not show any evidence of active disease.                          On review of systems, the patient states that he is doing very well in general and is without complaints.  He specifically denies headaches, dizziness, N/V, imbalance or difficulty with speech.  He has not noted any  focal weakness and overall, remains pleased with his progress to date.  ALLERGIES:  is allergic to pertussis vaccines.  Meds: Current Outpatient Medications  Medication Sig Dispense Refill   allopurinol  (ZYLOPRIM ) 300 MG tablet TAKE 1 TABLET BY MOUTH EVERY DAY 90 tablet 0   ampicillin (PRINCIPEN) 500 MG capsule Take 500 mg by mouth 2 (two) times daily as needed.     apixaban  (ELIQUIS ) 5 MG TABS tablet TAKE 1 TABLET (5 MG TOTAL) BY MOUTH 2 (TWO) TIMES DAILY. NEEDS CARDIOLOGY APPT FOR REFILLS, CALL OFFICE [phone]. THANK YOU 15 tablet 0   ferrous sulfate 325 (65 FE) MG tablet Take 325 mg by mouth daily.     finasteride (PROSCAR) 5 MG tablet Take 5 mg by mouth.     furosemide  (LASIX ) 40 MG tablet Take 40 mg by mouth every other day as needed.     metoprolol  succinate (TOPROL -XL) 50 MG 24 hr tablet TAKE 1 TABLET BY MOUTH EVERY DAY 30 tablet 0   predniSONE  (DELTASONE ) 5 MG tablet Take 1 tablet (5 mg total) by mouth daily with breakfast. 60 tablet 0   tamsulosin  (FLOMAX ) 0.4 MG CAPS capsule Take 1 capsule (0.4 mg total) by mouth daily. 30 capsule 0   valsartan  (DIOVAN ) 80 MG tablet TAKE 1 TABLET BY MOUTH EVERY DAY 30 tablet 0   No current facility-administered medications for this encounter.    Physical Findings:  weight is 178 lb (80.7 kg).  Pain Assessment Pain Score: 0-No pain/10 Unable to assess due to telephone follow-up visit format.  Lab Findings: Lab Results  Component Value Date   WBC 7.3 05/22/2024   HGB 11.8 (L) 05/22/2024   HCT 33.0 (L) 05/22/2024   MCV 97.9 05/22/2024   PLT 195 05/22/2024     Radiographic Findings: MR Brain W Wo Contrast Result Date: 05/21/2024 CLINICAL DATA:  Brain metastases, assess treatment response. Metastatic melanoma. EXAM: MRI HEAD WITHOUT AND WITH CONTRAST TECHNIQUE: Multiplanar, multiecho pulse sequences of the brain and surrounding structures were obtained without and with intravenous contrast. CONTRAST:  8 mL Vueway  COMPARISON:  Head MRI  02/19/2024 FINDINGS: Brain: There is a new 8 mm focus enhancement in the right temporoparietal region (series 13, image 85 and series 16, image 7). This is largely curvilinear in orientation along the lateral cortex, although there is mild thickening up to 3 mm posteriorly. There is no associated edema or restricted diffusion. In retrospect, a 1.5 mm focus enhancement was present in the immediately inferior adjacent gyrus on the prior MRI and has resolved in the interim. Both the resolved punctate focus of enhancement and the currently present curvilinear enhancement are located slightly lateral and superior to a previously treated metastasis. No other abnormal intracranial enhancement is identified. No acute infarct, intracranial hemorrhage, midline shift, or extra-axial fluid collection is evident. Cerebral volume is within normal limits for age. The ventricles are normal in size. Scattered small T2 hyperintensities in the cerebral white matter bilaterally are unchanged and nonspecific but compatible with mild chronic small vessel ischemic disease. Vascular: Major intracranial vascular flow voids are preserved. Skull and upper cervical spine: No suspicious marrow lesion. Scalp scarring at the vertex. Sinuses/Orbits: Bilateral cataract extraction. Mild mucosal thickening in the paranasal sinuses. Left maxillary sinus mucous retention cyst. Clear mastoid air cells. Other: Unchanged varix involving the right facial vein. IMPRESSION: 1. New 8 mm focus of cortical enhancement in the right temporoparietal region. Given the configuration, interval resolution of a separate punctate focus of enhancement in this region, and proximity to a previously treated metastasis, this may reflect post treatment changes or a subacute infarct. Short interval follow-up is recommended to exclude a new metastasis. 2. No other evidence of intracranial metastases. Electronically Signed   By: Dasie Hamburg M.D.   On: 05/21/2024 13:32     Impression/Plan: 5. 83 year old man with brain metastases secondary to stage IV malignant melanoma. He tolerated the [facility] treatment very well and remains without complaints.  We reviewed the results of his recent posttreatment MRI brain scan which continues to show an excellent treatment response with resolution of the treated lesions.  There was a new 8 mm focus of cortical enhancement in the right temporoparietal region that was felt most likely to be treatment related changes versus a small subacute infarct as opposed to a new metastasis when reviewed in our multidisciplinary conference.  The recommendation is to obtain a short interval MRI brain scan in 2 months to continue to monitor for any evidence of disease progression or recurrence.  I will plan to follow-up by telephone following the scan to review the results and recommendations from the multidisciplinary brain conference.  He knows that he is welcome to call at anytime with any questions or concerns in the interim.  I personally spent 20 minutes in this encounter including chart review, reviewing radiological studies, telephone conversation with the patient, entering orders and completing documentation.    Sabra MICAEL Rusk, PA-C

## 2024-06-02 ENCOUNTER — Other Ambulatory Visit: Payer: Self-pay | Admitting: Student

## 2024-06-02 ENCOUNTER — Other Ambulatory Visit: Payer: Self-pay

## 2024-06-02 DIAGNOSIS — I10 Essential (primary) hypertension: Secondary | ICD-10-CM

## 2024-06-02 DIAGNOSIS — M130 Polyarthritis, unspecified: Secondary | ICD-10-CM

## 2024-06-02 NOTE — Assessment & Plan Note (Addendum)
 Continue immunotherapy with nivolumab  Monitor clinical signs of worsening immune related side effects Return in 2 weeks with labs visit and treatment

## 2024-06-02 NOTE — Assessment & Plan Note (Addendum)
 MRI in 2 months ordered by Rad onc

## 2024-06-02 NOTE — Progress Notes (Unsigned)
 [facility] OFFICE PROGRESS NOTE  Patient Care Team: [name], MD as PCP - General (Family Medicine) [name], MD as PCP - Electrophysiology (Cardiology) [name], MD (Cardiology) [name], MD as Attending Physician (Urology) [name], MD as Consulting Physician (Dermatology) [name], MD as Consulting Physician (Ophthalmology) [name], MD as Consulting Physician (Gastroenterology) [name], [facility] as Pharmacist (Pharmacist) [name], RN as [facility] Care Management  83 y.o.man undergoing nivolumab  for stage IV melanoma being follow up for metastatic melanoma on treatment.    Previous immune related side effects of diarrhea resolved. Previous polyarthritis resolved.  No recurrence.    Current Diagnosis: Metastatic melanoma with brain and lung metastases.  Suspicious for bone metastases. BRAF negative. 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 (Started 09/06/23) 08/19/23 SRS to 4 brain mets   Last PET showed NED from July 2025.    MRI report new 8 mm focus in the right temporoparietal region question regarding may reflect posttreatment changes or subacute infarct.  Patient has no neurologic changes.  Case reviewed with neuro [facility] and the recommendation was short-term interval follow-up in about 2 months.    No signs of recurrent polyarthritis.  Will decrease prednisone  5 mg to 3 times a week. Assessment & Plan Malignant melanoma metastatic to brain [facility]) MRI in 2 months ordered by Rad onc Melanoma of scalp (HCC) Continue immunotherapy with nivolumab  Monitor clinical signs of worsening immune related side effects Return in 2 weeks with labs visit and treatment Polyarthritis Patient is on apixaban  for history of A-fib.   Will dec Prednisone  to 5 mg 3 times weekly.    [name], MD  INTERVAL HISTORY: Patient returns for follow-up.  Overall he feels well.  No new  arthritis.  Taking prednisone  5 mg daily.  No GI bleed, he is on Eliquis .  No coughing, short of breath, new headaches, visual changes.  No diarrhea, rash or other new side effects.  Oncology History  Melanoma of scalp (HCC)  01/01/2015 Initial Diagnosis   Malignant melanoma (HCC)   12/2014 Surgery   April 2016 WLE and SLN 01/31/2015: Reexcision    02/25/2018 Imaging   CT CAP Subcentimeter right upper lobe subpleural and left upper lobe perifissural nodules, new from 01/24/2016, indeterminate. Recommend short interval follow-up (3 months), given histor    04/07/2019 Imaging   CT Chest: No evidence of metastatic disease within the chest. Left upper lobe perifissural nodule no longer evident. Right upper lobe subpleural nodule no longer evident.   CT AP No evidence of metastatic disease.     04/2020 Pathology Results   Summer 2021: New scalp lesion, in transit  path: -Malignant melanoma, involving dermal tissue    04/12/2020 Imaging   CT chest: NED CT AP: NED    05/04/2020 Imaging   CT Neck: No mass or lymphadenopathy within the neck.    05/25/2020 - 07/25/2020 Chemotherapy   5 cycles of TVEC   01/24/2021 PET scan   PET whole body - No abnormal foci of increased radiotracer uptake to suggest metastatic disease.  -Small focus of increased uptake within the proximal sigmoid colon without adjacent stranding or definite wall thickening, indeterminant. Recommend further evaluation with colonoscopy if clinically    08/24/2021 PET scan   PET whole body - No abnormal foci of increased radiotracer uptake to suggest metastatic disease.   - Small focus of increased uptake within the proximal sigmoid colon, similar to 01/24/2021.  Recommend clinical correlation and/or further evaluation with colonoscopy if clinically indicated    01/31/2022 Surgery   (Outside case, [phone], 1 slide, 01/23/2022) Skin, left inferior lateral anterior scalp, shave - Metastatic malignant melanoma Size: 2  mm Pattern: Pigmented epithelioid cells forming small plaque in superficial dermis and showing epidermotropism Tumor infiltrating lymphocytes: Brisk Nuclear grade: 3/3 Margins: Focally present adjacent to edge of tissue sections    02/21/2022 - 05/09/2022 Chemotherapy   had an intransit recurrence of melanoma and was retreated with TVEC.  Biopsy in October 2023 showed NED.    05/09/2022 PET scan   - Mildly avid 3 mm right upper lobe subcentimeter pulmonary nodule, possibly increased from 2 mm on prior; recommend attention on follow-up.   -No definite evidence of metabolically active disease.    05/28/2022 Pathology Results   Diagnosis   Skin, Left temple, excision - Dermal fibrosis with lymphocytic and granulomatous inflammation consistent with site of recent therapy - No residual melanoma identified - Margins free of tumor      02/15/2023 PET scan   - Focal area of hypermetabolic activity is seen in the ascending colon, indeterminate. Recommend correlation with colonoscopy.   -Unchanged size of mildly hypermetabolic left paratracheal lymph node, indeterminate. Recommend attention on follow-up imaging.   -Pancreatic calcifications likely reflect sequela of chronic pancreatitis.   -Bladder diverticuli and enlarged prostate.    05/13/2023 PET scan   1.  Evidence of progressive disease as demonstrated by interval enlargement of right upper lobe pulmonary nodule most concerning for metastasis by melanoma. Additional single right pulmonary nodule and 2 sites of focal osseous avidity which are suspicious but indeterminate for additional sites of involvement by metastasis involving the T8 and L1 vertebral bodies.  2.  Focal uptake within the sigmoid colon which has increased in conspicuity over time. Recommend correlation with colonoscopy if not recently completed with differential diagnosis including adenoma, carcinoma, and inflammatory lesion (given associated coarse calcifications/high density  material). No evidence of complication (i.e.  fluid collection, or free air) by low-dose unenhanced CT.  3.  Persistent FDG abnormality of the left anterior shin associated with a dermal nodule. Recommend direct inspection as additional site of cutaneous neoplasm is not excluded.    06/14/2023 Pathology Results   A: Lung, right upper lobe, lobectomy  - Metastatic melanoma, multifocal (largest focus 2.4 cm, with two sub-centimeter nodules also identified microscopically, each 3 mm) - Surgical margins negative  - BRAF (VE1): Negative - TILs: brisk - Two lymph nodes, negative for malignancy (0/2)   07/17/2023 Genetic Testing   [place]: pertinent negatives: BRAF. KIT TERT C.-124C>T variant- promoter mutation NRAS p.G13D Missense variant (exon 2) - GOF TP53 p.R342P ARID1A p.G285fs NF1 p.R1362* PHLPP2 p.Q300* NF1 p.K191_R192delinsN*  Multiple VUS findings.   08/05/2023 Imaging   MRI brain  --Single enhancing right occipital cortical lesion is most concerning for intracranial metastatic disease.   --Indeterminate 7 mm enhancing lesion in the right posterior parietal bone. There is no definite FDG avidity in this region on the recent PET/CT, arguing against malignancy, however close attention to this area on follow-up imaging is recommended.    08/05/2023 Imaging   CT chest IMPRESSION:   -Right upper lobectomy with moderate sized loculated right pleural effusion. New 0.3 cm right lower lobe micronodule. Recommend attention on follow-up CT in 3 months.   Few subcentimeter right infra-axillary lymph nodes with adjacent fat stranding and benign morphology, new since prior. Recommendation follow-up CT.   Markedly enlarged left atrium and mild qualitative  dilatation of right atrium. Extensive coronary artery calcifications.    09/06/2023 -  Chemotherapy   Patient is on Treatment Plan : METASTATIC MELANOMA Nivolumab  (240) q14d     09/19/2023 Cancer Staging   Staging form: Melanoma of the Skin,  AJCC 7th Edition - Clinical: Stage IV (TX, N0, M1c) - Signed by Tina [name], MD on 09/19/2023 Biopsy of metastatic site performed: Yes Source of metastatic specimen: Lung   11/19/2023 Imaging   MRI brain: Positive treatment response, previously seen brain lesions are no longer detectable. No new lesion.   11/21/2023 PET scan   PET: after 6C of nivo every 2 weeks. Marked response to therapy, including resolution of hepatic and osseous hypermetabolism.      PHYSICAL EXAMINATION: ECOG PERFORMANCE STATUS: 1 - Symptomatic but completely ambulatory  Vitals:   06/04/24 1118  BP: 139/74  Pulse: 82  Resp: 20  Temp: 97.7 F (36.5 C)  SpO2: 97%   Filed Weights   06/04/24 1118  Weight: 182 lb 12.8 oz (82.9 kg)    GENERAL: alert, no distress and comfortable SKIN: skin color normal and no jaundice  EYES:  sclera clear LYMPH:  no palpable cervical lymphadenopathy  LUNGS: clear to auscultation and no wheeze or rales with normal breathing effort HEART: regular rate & rhythm  ABDOMEN: abdomen soft, non-tender and nondistended. Musculoskeletal: no edema   Relevant data reviewed during this visit included labs.

## 2024-06-04 ENCOUNTER — Inpatient Hospital Stay

## 2024-06-04 VITALS — BP 139/74 | HR 82 | Temp 97.7°F | Resp 20 | Wt 182.8 lb

## 2024-06-04 DIAGNOSIS — Z5112 Encounter for antineoplastic immunotherapy: Secondary | ICD-10-CM | POA: Insufficient documentation

## 2024-06-04 DIAGNOSIS — C7931 Secondary malignant neoplasm of brain: Secondary | ICD-10-CM | POA: Insufficient documentation

## 2024-06-04 DIAGNOSIS — Z79899 Other long term (current) drug therapy: Secondary | ICD-10-CM | POA: Insufficient documentation

## 2024-06-04 DIAGNOSIS — D649 Anemia, unspecified: Secondary | ICD-10-CM | POA: Insufficient documentation

## 2024-06-04 DIAGNOSIS — C434 Malignant melanoma of scalp and neck: Secondary | ICD-10-CM | POA: Insufficient documentation

## 2024-06-04 DIAGNOSIS — Z9221 Personal history of antineoplastic chemotherapy: Secondary | ICD-10-CM | POA: Diagnosis not present

## 2024-06-04 DIAGNOSIS — M130 Polyarthritis, unspecified: Secondary | ICD-10-CM | POA: Diagnosis not present

## 2024-06-04 LAB — CBC WITH DIFFERENTIAL (CANCER CENTER ONLY)
Abs Immature Granulocytes: 0.04 K/uL (ref 0.00–0.07)
Basophils Absolute: 0.1 K/uL (ref 0.0–0.1)
Basophils Relative: 1 %
Eosinophils Absolute: 0.1 K/uL (ref 0.0–0.5)
Eosinophils Relative: 1 %
HCT: 36 % — ABNORMAL LOW (ref 39.0–52.0)
Hemoglobin: 12.3 g/dL — ABNORMAL LOW (ref 13.0–17.0)
Immature Granulocytes: 1 %
Lymphocytes Relative: 14 %
Lymphs Abs: 1 K/uL (ref 0.7–4.0)
MCH: 34.2 pg — ABNORMAL HIGH (ref 26.0–34.0)
MCHC: 34.2 g/dL (ref 30.0–36.0)
MCV: 100 fL (ref 80.0–100.0)
Monocytes Absolute: 0.9 K/uL (ref 0.1–1.0)
Monocytes Relative: 13 %
Neutro Abs: 5 K/uL (ref 1.7–7.7)
Neutrophils Relative %: 70 %
Platelet Count: 195 K/uL (ref 150–400)
RBC: 3.6 MIL/uL — ABNORMAL LOW (ref 4.22–5.81)
RDW: 16.5 % — ABNORMAL HIGH (ref 11.5–15.5)
WBC Count: 7 K/uL (ref 4.0–10.5)
nRBC: 0 % (ref 0.0–0.2)

## 2024-06-04 LAB — CMP (CANCER CENTER ONLY)
ALT: 36 U/L (ref 0–44)
AST: 31 U/L (ref 15–41)
Albumin: 4.7 g/dL (ref 3.5–5.0)
Alkaline Phosphatase: 85 U/L (ref 38–126)
Anion gap: 7 (ref 5–15)
BUN: 15 mg/dL (ref 8–23)
CO2: 30 mmol/L (ref 22–32)
Calcium: 10.2 mg/dL (ref 8.9–10.3)
Chloride: 97 mmol/L — ABNORMAL LOW (ref 98–111)
Creatinine: 1.05 mg/dL (ref 0.61–1.24)
GFR, Estimated: >60 mL/min (ref >60)
Glucose, Bld: 121 mg/dL — ABNORMAL HIGH (ref 70–99)
Potassium: 3.9 mmol/L (ref 3.5–5.1)
Sodium: 134 mmol/L — ABNORMAL LOW (ref 135–145)
Total Bilirubin: 1.2 mg/dL (ref 0.0–1.2)
Total Protein: 7.8 g/dL (ref 6.5–8.1)

## 2024-06-04 LAB — LACTATE DEHYDROGENASE: LDH: 159 U/L (ref 98–192)

## 2024-06-04 LAB — TSH: TSH: 2.22 u[IU]/mL (ref 0.350–4.500)

## 2024-06-04 LAB — T4, FREE: Free T4: 0.96 ng/dL (ref 0.61–1.12)

## 2024-06-04 MED ORDER — SODIUM CHLORIDE 0.9 % IV SOLN
240.0000 mg | Freq: Once | INTRAVENOUS | Status: AC
  Administered 2024-06-04: 240 mg via INTRAVENOUS
  Filled 2024-06-04: qty 24

## 2024-06-04 MED ORDER — SODIUM CHLORIDE 0.9 % IV SOLN: INTRAVENOUS | Status: DC

## 2024-06-04 NOTE — Assessment & Plan Note (Addendum)
 Patient is on apixaban  for history of A-fib.   Will dec Prednisone  to 5 mg 3 times weekly.

## 2024-06-04 NOTE — Patient Instructions (Signed)
 [facility] - A DEPT OF [facility]  Discharge Instructions: Thank you for choosing [facility] to provide your oncology and hematology care.   If you have a lab appointment with the Cancer Center, please go directly to the Cancer Center and check in at the registration area.   Wear comfortable clothing and clothing appropriate for easy access to any Portacath or PICC line.   We strive to give you quality time with your provider. You may need to reschedule your appointment if you arrive late (15 or more minutes).  Arriving late affects you and other patients whose appointments are after yours.  Also, if you miss three or more appointments without notifying the office, you may be dismissed from the clinic at the provider's discretion.      For prescription refill requests, have your pharmacy contact our office and allow 72 hours for refills to be completed.    Today you received the following chemotherapy and/or immunotherapy agents: nivolumab (OPDIVO)       To help prevent nausea and vomiting after your treatment, we encourage you to take your nausea medication as directed.  BELOW ARE SYMPTOMS THAT SHOULD BE REPORTED IMMEDIATELY: *FEVER GREATER THAN 100.4 F (38 C) OR HIGHER *CHILLS OR SWEATING *NAUSEA AND VOMITING THAT IS NOT CONTROLLED WITH YOUR NAUSEA MEDICATION *UNUSUAL SHORTNESS OF BREATH *UNUSUAL BRUISING OR BLEEDING *URINARY PROBLEMS (pain or burning when urinating, or frequent urination) *BOWEL PROBLEMS (unusual diarrhea, constipation, pain near the anus) TENDERNESS IN MOUTH AND THROAT WITH OR WITHOUT PRESENCE OF ULCERS (sore throat, sores in mouth, or a toothache) UNUSUAL RASH, SWELLING OR PAIN  UNUSUAL VAGINAL DISCHARGE OR ITCHING   Items with * indicate a potential emergency and should be followed up as soon as possible or go to the Emergency Department if any problems should occur.  Please show the CHEMOTHERAPY ALERT CARD or  IMMUNOTHERAPY ALERT CARD at check-in to the Emergency Department and triage nurse.  Should you have questions after your visit or need to cancel or reschedule your appointment, please contact [facility] - A DEPT OF [facility]  Dept: [phone]  and follow the prompts.  Office hours are 8:00 a.m. to 4:30 p.m. Monday - Friday. Please note that voicemails left after 4:00 p.m. may not be returned until the following business day.  We are closed weekends and major holidays. You have access to a nurse at all times for urgent questions. Please call the main number to the clinic Dept: [phone] and follow the prompts.   For any non-urgent questions, you may also contact your provider using MyChart. We now offer e-Visits for anyone 3 and older to request care online for non-urgent symptoms. For details visit [email or web address].   Also download the MyChart app! Go to the app store, search "MyChart", open the app, select [place], and log in with your MyChart username and password.

## 2024-06-11 ENCOUNTER — Other Ambulatory Visit: Payer: Self-pay | Admitting: Student

## 2024-06-16 DIAGNOSIS — L080 Pyoderma: Secondary | ICD-10-CM | POA: Diagnosis not present

## 2024-06-16 DIAGNOSIS — D225 Melanocytic nevi of trunk: Secondary | ICD-10-CM | POA: Diagnosis not present

## 2024-06-16 DIAGNOSIS — L821 Other seborrheic keratosis: Secondary | ICD-10-CM | POA: Diagnosis not present

## 2024-06-16 DIAGNOSIS — C44622 Squamous cell carcinoma of skin of right upper limb, including shoulder: Secondary | ICD-10-CM | POA: Diagnosis not present

## 2024-06-16 DIAGNOSIS — L853 Xerosis cutis: Secondary | ICD-10-CM | POA: Diagnosis not present

## 2024-06-16 DIAGNOSIS — D485 Neoplasm of uncertain behavior of skin: Secondary | ICD-10-CM | POA: Diagnosis not present

## 2024-06-16 DIAGNOSIS — Z85828 Personal history of other malignant neoplasm of skin: Secondary | ICD-10-CM | POA: Diagnosis not present

## 2024-06-16 DIAGNOSIS — L812 Freckles: Secondary | ICD-10-CM | POA: Diagnosis not present

## 2024-06-16 DIAGNOSIS — L570 Actinic keratosis: Secondary | ICD-10-CM | POA: Diagnosis not present

## 2024-06-16 DIAGNOSIS — Z85820 Personal history of malignant melanoma of skin: Secondary | ICD-10-CM | POA: Diagnosis not present

## 2024-06-17 ENCOUNTER — Other Ambulatory Visit: Payer: Self-pay | Admitting: Internal Medicine

## 2024-06-17 DIAGNOSIS — I480 Paroxysmal atrial fibrillation: Secondary | ICD-10-CM

## 2024-06-17 NOTE — Telephone Encounter (Signed)
 Eliquis  5mg  refill request received. Patient is 83 years old, weight-82.9kg, Crea-1.05 on 06/04/24, Diagnosis-Afib, and last seen by Daphne Barrack on 05/20/24. Dose is appropriate based on dosing criteria. Will send in refill to requested pharmacy.

## 2024-06-18 ENCOUNTER — Inpatient Hospital Stay

## 2024-06-18 VITALS — BP 149/67 | HR 62 | Temp 97.5°F | Resp 20 | Wt 181.7 lb

## 2024-06-18 DIAGNOSIS — C434 Malignant melanoma of scalp and neck: Secondary | ICD-10-CM | POA: Diagnosis not present

## 2024-06-18 DIAGNOSIS — E538 Deficiency of other specified B group vitamins: Secondary | ICD-10-CM | POA: Diagnosis not present

## 2024-06-18 DIAGNOSIS — C7931 Secondary malignant neoplasm of brain: Secondary | ICD-10-CM

## 2024-06-18 DIAGNOSIS — Z9189 Other specified personal risk factors, not elsewhere classified: Secondary | ICD-10-CM

## 2024-06-18 DIAGNOSIS — M130 Polyarthritis, unspecified: Secondary | ICD-10-CM | POA: Diagnosis not present

## 2024-06-18 DIAGNOSIS — Z5112 Encounter for antineoplastic immunotherapy: Secondary | ICD-10-CM | POA: Diagnosis not present

## 2024-06-18 LAB — CBC WITH DIFFERENTIAL (CANCER CENTER ONLY)
Abs Immature Granulocytes: 0.05 K/uL (ref 0.00–0.07)
Basophils Absolute: 0.1 K/uL (ref 0.0–0.1)
Basophils Relative: 1 %
Eosinophils Absolute: 0.1 K/uL (ref 0.0–0.5)
Eosinophils Relative: 2 %
HCT: 36.6 % — ABNORMAL LOW (ref 39.0–52.0)
Hemoglobin: 12.7 g/dL — ABNORMAL LOW (ref 13.0–17.0)
Immature Granulocytes: 1 %
Lymphocytes Relative: 22 %
Lymphs Abs: 1.3 K/uL (ref 0.7–4.0)
MCH: 35 pg — ABNORMAL HIGH (ref 26.0–34.0)
MCHC: 34.7 g/dL (ref 30.0–36.0)
MCV: 100.8 fL — ABNORMAL HIGH (ref 80.0–100.0)
Monocytes Absolute: 0.7 K/uL (ref 0.1–1.0)
Monocytes Relative: 11 %
Neutro Abs: 3.9 K/uL (ref 1.7–7.7)
Neutrophils Relative %: 63 %
Platelet Count: 224 K/uL (ref 150–400)
RBC: 3.63 MIL/uL — ABNORMAL LOW (ref 4.22–5.81)
RDW: 15.5 % (ref 11.5–15.5)
WBC Count: 6.2 K/uL (ref 4.0–10.5)
nRBC: 0 % (ref 0.0–0.2)

## 2024-06-18 LAB — CMP (CANCER CENTER ONLY)
ALT: 61 U/L — ABNORMAL HIGH (ref 0–44)
AST: 40 U/L (ref 15–41)
Albumin: 4.4 g/dL (ref 3.5–5.0)
Alkaline Phosphatase: 92 U/L (ref 38–126)
Anion gap: 6 (ref 5–15)
BUN: 12 mg/dL (ref 8–23)
CO2: 27 mmol/L (ref 22–32)
Calcium: 9.9 mg/dL (ref 8.9–10.3)
Chloride: 97 mmol/L — ABNORMAL LOW (ref 98–111)
Creatinine: 0.96 mg/dL (ref 0.61–1.24)
GFR, Estimated: >60 mL/min (ref >60)
Glucose, Bld: 129 mg/dL — ABNORMAL HIGH (ref 70–99)
Potassium: 4.1 mmol/L (ref 3.5–5.1)
Sodium: 130 mmol/L — ABNORMAL LOW (ref 135–145)
Total Bilirubin: 1.1 mg/dL (ref 0.0–1.2)
Total Protein: 7.1 g/dL (ref 6.5–8.1)

## 2024-06-18 LAB — T4, FREE: Free T4: 1.01 ng/dL (ref 0.61–1.12)

## 2024-06-18 LAB — LACTATE DEHYDROGENASE: LDH: 176 U/L (ref 98–192)

## 2024-06-18 LAB — TSH: TSH: 1.69 u[IU]/mL (ref 0.350–4.500)

## 2024-06-18 MED ORDER — PREDNISONE 5 MG PO TABS: 5.0000 mg | ORAL_TABLET | ORAL | 0 refills | Status: DC

## 2024-06-18 MED ORDER — SODIUM CHLORIDE 0.9 % IV SOLN
240.0000 mg | Freq: Once | INTRAVENOUS | Status: AC
  Administered 2024-06-18: 240 mg via INTRAVENOUS
  Filled 2024-06-18: qty 24

## 2024-06-18 MED ORDER — SODIUM CHLORIDE 0.9 % IV SOLN: INTRAVENOUS | Status: DC

## 2024-06-18 NOTE — Progress Notes (Signed)
 [facility] OFFICE PROGRESS NOTE  Patient Care Team: [name], MD as PCP - General (Family Medicine) [name], MD as PCP - Electrophysiology (Cardiology) [name], MD (Cardiology) [name], MD as Attending Physician (Urology) [name], MD as Consulting Physician (Dermatology) [name], MD as Consulting Physician (Ophthalmology) [name], MD as Consulting Physician (Gastroenterology) [name], [facility] as Pharmacist (Pharmacist) [name], RN as [facility] Care Management  83 y.o.man undergoing nivolumab  for stage IV melanoma being follow up for metastatic melanoma on treatment.    Previous immune related side effects of diarrhea resolved. Previous polyarthritis resolved.  No recurrence.    Current Diagnosis: Metastatic melanoma with brain and lung metastases.  Suspicious for bone metastases. BRAF negative. 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 (Started 09/06/23) 08/19/23 SRS to 4 brain mets   Last PET showed NED from July 2025.   Treatment held after 7/3, and resumed on 8/21 due to inflammatory arthritis.  Report recent he had melanoma like lesion removed.  Pending pathology results.  Patient does drink wine about nightly.  Recommend holding alcohol for now due to mild elevation of liver enzyme.  If worsening, we will need to hold immunotherapy. Assessment & Plan Malignant melanoma metastatic to brain [facility] MRI in 2 months ordered by Rad onc Melanoma of scalp (HCC) Continue immunotherapy with nivolumab  Monitor clinical signs of worsening immune related side effects Return in 2 weeks with labs visit and treatment Planning and PET scan about 1st or 2nd week of December. Low serum vitamin B12 Mild anemia. Ok to continue B12 500 mcg daily. Previously low-normal folate as well. Both improved At risk for side effect of medication Monitor with CBC, CMP each visit and H&P  for immune related toxicity.  Previously with inflammatory arthritis, diarrhea. Resolved. Polyarthritis Patient is on apixaban  for history of A-fib.   Will continue Prednisone  to 5 mg 3 times weekly. Refilled today.  Orders Placed This Encounter  Procedures   NM PET Image Restage (PS) Whole Body    Standing Status:   Future    Expected Date:   08/03/2024    Expiration Date:   06/18/2025    If indicated for the ordered procedure, I authorize the administration of a radiopharmaceutical per Radiology protocol:   Yes    Preferred imaging location?:   [facility]   Lactate dehydrogenase    Standing Status:   Future    Expected Date:   08/06/2024    Expiration Date:   08/06/2025   CBC with Differential (Cancer Center Only)    Standing Status:   Future    Expected Date:   08/06/2024    Expiration Date:   08/06/2025   CMP (Cancer Center only)    Standing Status:   Future    Expected Date:   08/06/2024    Expiration Date:   08/06/2025   T4, free    Standing Status:   Future    Expected Date:   08/06/2024    Expiration Date:   08/06/2025   TSH    Standing Status:   Future    Expected Date:   08/06/2024    Expiration Date:   08/06/2025     [name], MD  INTERVAL HISTORY: Patient returns for follow-up.  Oncology History  Melanoma of scalp (HCC)  01/01/2015 Initial Diagnosis   Malignant melanoma (HCC)   12/2014 Surgery   April 2016 WLE and SLN 01/31/2015: Reexcision  02/25/2018 Imaging   CT CAP Subcentimeter right upper lobe subpleural and left upper lobe perifissural nodules, new from 01/24/2016, indeterminate. Recommend short interval follow-up (3 months), given histor    04/07/2019 Imaging   CT Chest: No evidence of metastatic disease within the chest. Left upper lobe perifissural nodule no longer evident. Right upper lobe subpleural nodule no longer evident.   CT AP No evidence of metastatic disease.     04/2020 Pathology Results   Summer 2021: New scalp lesion, in transit   path: -Malignant melanoma, involving dermal tissue    04/12/2020 Imaging   CT chest: NED CT AP: NED    05/04/2020 Imaging   CT Neck: No mass or lymphadenopathy within the neck.    05/25/2020 - 07/25/2020 Chemotherapy   5 cycles of TVEC   01/24/2021 PET scan   PET whole body - No abnormal foci of increased radiotracer uptake to suggest metastatic disease.  -Small focus of increased uptake within the proximal sigmoid colon without adjacent stranding or definite wall thickening, indeterminant. Recommend further evaluation with colonoscopy if clinically    08/24/2021 PET scan   PET whole body - No abnormal foci of increased radiotracer uptake to suggest metastatic disease.   - Small focus of increased uptake within the proximal sigmoid colon, similar to 01/24/2021. Recommend clinical correlation and/or further evaluation with colonoscopy if clinically indicated    01/31/2022 Surgery   (Outside case, [phone], 1 slide, 01/23/2022) Skin, left inferior lateral anterior scalp, shave - Metastatic malignant melanoma Size: 2 mm Pattern: Pigmented epithelioid cells forming small plaque in superficial dermis and showing epidermotropism Tumor infiltrating lymphocytes: Brisk Nuclear grade: 3/3 Margins: Focally present adjacent to edge of tissue sections    02/21/2022 - 05/09/2022 Chemotherapy   had an intransit recurrence of melanoma and was retreated with TVEC.  Biopsy in October 2023 showed NED.    05/09/2022 PET scan   - Mildly avid 3 mm right upper lobe subcentimeter pulmonary nodule, possibly increased from 2 mm on prior; recommend attention on follow-up.   -No definite evidence of metabolically active disease.    05/28/2022 Pathology Results   Diagnosis   Skin, Left temple, excision - Dermal fibrosis with lymphocytic and granulomatous inflammation consistent with site of recent therapy - No residual melanoma identified - Margins free of tumor      02/15/2023 PET scan   - Focal area  of hypermetabolic activity is seen in the ascending colon, indeterminate. Recommend correlation with colonoscopy.   -Unchanged size of mildly hypermetabolic left paratracheal lymph node, indeterminate. Recommend attention on follow-up imaging.   -Pancreatic calcifications likely reflect sequela of chronic pancreatitis.   -Bladder diverticuli and enlarged prostate.    05/13/2023 PET scan   1.  Evidence of progressive disease as demonstrated by interval enlargement of right upper lobe pulmonary nodule most concerning for metastasis by melanoma. Additional single right pulmonary nodule and 2 sites of focal osseous avidity which are suspicious but indeterminate for additional sites of involvement by metastasis involving the T8 and L1 vertebral bodies.  2.  Focal uptake within the sigmoid colon which has increased in conspicuity over time. Recommend correlation with colonoscopy if not recently completed with differential diagnosis including adenoma, carcinoma, and inflammatory lesion (given associated coarse calcifications/high density material). No evidence of complication (i.e.  fluid collection, or free air) by low-dose unenhanced CT.  3.  Persistent FDG abnormality of the left anterior shin associated with a dermal nodule. Recommend direct inspection as additional site of cutaneous neoplasm is  not excluded.    06/14/2023 Pathology Results   A: Lung, right upper lobe, lobectomy  - Metastatic melanoma, multifocal (largest focus 2.4 cm, with two sub-centimeter nodules also identified microscopically, each 3 mm) - Surgical margins negative  - BRAF (VE1): Negative - TILs: brisk - Two lymph nodes, negative for malignancy (0/2)   07/17/2023 Genetic Testing   [place]: pertinent negatives: BRAF. KIT TERT C.-124C>T variant- promoter mutation NRAS p.G13D Missense variant (exon 2) - GOF TP53 p.R342P ARID1A p.G285fs NF1 p.R1362* PHLPP2 p.Q300* NF1 p.K191_R192delinsN*  Multiple VUS findings.    08/05/2023 Imaging   MRI brain  --Single enhancing right occipital cortical lesion is most concerning for intracranial metastatic disease.   --Indeterminate 7 mm enhancing lesion in the right posterior parietal bone. There is no definite FDG avidity in this region on the recent PET/CT, arguing against malignancy, however close attention to this area on follow-up imaging is recommended.    08/05/2023 Imaging   CT chest IMPRESSION:   -Right upper lobectomy with moderate sized loculated right pleural effusion. New 0.3 cm right lower lobe micronodule. Recommend attention on follow-up CT in 3 months.   Few subcentimeter right infra-axillary lymph nodes with adjacent fat stranding and benign morphology, new since prior. Recommendation follow-up CT.   Markedly enlarged left atrium and mild qualitative dilatation of right atrium. Extensive coronary artery calcifications.    09/06/2023 -  Chemotherapy   Patient is on Treatment Plan : METASTATIC MELANOMA Nivolumab  (240) q14d     09/19/2023 Cancer Staging   Staging form: Melanoma of the Skin, AJCC 7th Edition - Clinical: Stage IV (TX, N0, M1c) - Signed by Tina [name], MD on 09/19/2023 Biopsy of metastatic site performed: Yes Source of metastatic specimen: Lung   11/19/2023 Imaging   MRI brain: Positive treatment response, previously seen brain lesions are no longer detectable. No new lesion.   11/21/2023 PET scan   PET: after 6C of nivo every 2 weeks. Marked response to therapy, including resolution of hepatic and osseous hypermetabolism.      PHYSICAL EXAMINATION: ECOG PERFORMANCE STATUS: 1 - Symptomatic but completely ambulatory  Vitals:   06/18/24 1115 06/18/24 1124  BP: (!) 161/86 (!) 149/67  Pulse: 62   Resp: 20   Temp: (!) 97.5 F (36.4 C)   SpO2: 98%    Filed Weights   06/18/24 1115  Weight: 181 lb 11.2 oz (82.4 kg)    GENERAL: alert, no distress and comfortable SKIN: skin color normal and no jaundice.  Scabbing on the  biopsy site on the scalp EYES:  sclera clear LYMPH:  no palpable cervical, axillary lymphadenopathy  LUNGS: clear to auscultation and no wheeze or rales with normal breathing effort HEART: regular rate & rhythm  ABDOMEN: abdomen soft, non-tender and nondistended. Musculoskeletal: no edema   Relevant data reviewed during this visit included labs.  New imaging ordered.

## 2024-06-18 NOTE — Assessment & Plan Note (Addendum)
 MRI in 2 months ordered by Rad onc

## 2024-06-18 NOTE — Assessment & Plan Note (Addendum)
 Monitor with CBC, CMP each visit and H&P for immune related toxicity.  Previously with inflammatory arthritis, diarrhea. Resolved.

## 2024-06-18 NOTE — Assessment & Plan Note (Addendum)
 Patient is on apixaban  for history of A-fib.   Will continue Prednisone  to 5 mg 3 times weekly. Refilled today.

## 2024-06-18 NOTE — Assessment & Plan Note (Addendum)
 Continue immunotherapy with nivolumab  Monitor clinical signs of worsening immune related side effects Return in 2 weeks with labs visit and treatment Planning and PET scan about 1st or 2nd week of December.

## 2024-06-18 NOTE — Assessment & Plan Note (Addendum)
 Mild anemia. Ok to continue B12 500 mcg daily. Previously low-normal folate as well. Both improved

## 2024-07-01 DIAGNOSIS — R7989 Other specified abnormal findings of blood chemistry: Secondary | ICD-10-CM | POA: Insufficient documentation

## 2024-07-01 NOTE — Assessment & Plan Note (Signed)
Continue monitor

## 2024-07-01 NOTE — Assessment & Plan Note (Addendum)
 Continue immunotherapy with nivolumab  Monitor clinical signs of worsening immune related side effects Return in 2 weeks with labs visit and treatment Planning and PET scan about 1st or 2nd week of December.

## 2024-07-01 NOTE — Progress Notes (Unsigned)
 [facility] OFFICE PROGRESS NOTE  Patient Care Team: [name], MD as PCP - General (Family Medicine) [name], MD (Inactive) as PCP - Electrophysiology (Cardiology) [name], MD (Inactive) (Cardiology) [name], MD as Attending Physician (Urology) [name], MD as Consulting Physician (Dermatology) [name], MD as Consulting Physician (Ophthalmology) [name], MD as Consulting Physician (Gastroenterology) [name], [facility] as Pharmacist (Pharmacist) [name], RN as [facility] Care Management  83 y.o.man undergoing nivolumab  for stage IV melanoma being follow up for metastatic melanoma on treatment.    Previous immune related side effects of diarrhea resolved. Previous polyarthritis resolved.  No recurrence.    Current Diagnosis: Metastatic melanoma with brain and lung metastases.  Suspicious for bone metastases. BRAF negative. 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 (Started 09/06/23) 08/19/23 SRS to 4 brain mets   Last PET showed NED from July 2025.    Treatment held after 7/3, and resumed on 8/21 due to inflammatory arthritis.  Overall doing well with resuming treatment.  No new side effects or concerns.  Restaging imaging has been planned. Assessment & Plan Malignant melanoma metastatic to brain [facility]) MRI scheduled for 11/18 ordered by Rad onc Melanoma of scalp (HCC) Continue immunotherapy with nivolumab  Monitor clinical signs of worsening immune related side effects Return in 2 weeks with labs visit and treatment Planning and PET scan about 1st or 2nd week of December. Low serum vitamin B12 Mild anemia. Ok to continue B12 500 mcg daily. Previously low-normal folate as well. Both improved Abnormal liver function test Continue monitor  Orders Placed This Encounter  Procedures   Lactate dehydrogenase    Standing Status:   Future    Expected Date:   08/21/2024     Expiration Date:   08/21/2025   CBC with Differential (Cancer Center Only)    Standing Status:   Future    Expected Date:   08/21/2024    Expiration Date:   08/21/2025   CMP (Cancer Center only)    Standing Status:   Future    Expected Date:   08/21/2024    Expiration Date:   08/21/2025   T4, free    Standing Status:   Future    Expected Date:   08/21/2024    Expiration Date:   08/21/2025   TSH    Standing Status:   Future    Expected Date:   08/21/2024    Expiration Date:   08/21/2025   Lactate dehydrogenase    Standing Status:   Future    Expected Date:   09/04/2024    Expiration Date:   09/04/2025   CBC with Differential (Cancer Center Only)    Standing Status:   Future    Expected Date:   09/04/2024    Expiration Date:   09/04/2025   CMP (Cancer Center only)    Standing Status:   Future    Expected Date:   09/04/2024    Expiration Date:   09/04/2025   T4, free    Standing Status:   Future    Expected Date:   09/04/2024    Expiration Date:   09/04/2025   TSH    Standing Status:   Future    Expected Date:   09/04/2024    Expiration Date:   09/04/2025   Lactate dehydrogenase    Standing Status:   Future    Expected Date:   09/18/2024    Expiration Date:  09/18/2025   CBC with Differential (Cancer Center Only)    Standing Status:   Future    Expected Date:   09/18/2024    Expiration Date:   09/18/2025   CMP (Cancer Center only)    Standing Status:   Future    Expected Date:   09/18/2024    Expiration Date:   09/18/2025   T4, free    Standing Status:   Future    Expected Date:   09/18/2024    Expiration Date:   09/18/2025   TSH    Standing Status:   Future    Expected Date:   09/18/2024    Expiration Date:   09/18/2025   Lactate dehydrogenase    Standing Status:   Future    Expected Date:   10/02/2024    Expiration Date:   10/02/2025   CBC with Differential (Cancer Center Only)    Standing Status:   Future    Expected Date:   10/02/2024    Expiration Date:   10/02/2025   CMP  (Cancer Center only)    Standing Status:   Future    Expected Date:   10/02/2024    Expiration Date:   10/02/2025   T4, free    Standing Status:   Future    Expected Date:   10/02/2024    Expiration Date:   10/02/2025   TSH    Standing Status:   Future    Expected Date:   10/02/2024    Expiration Date:   10/02/2025     Pauletta JAYSON [name], MD  INTERVAL HISTORY: Patient returns for follow-up.  Overall feeling well.  No worsening rash, joint pain, diarrhea or other side effects.  No new headaches.  Oncology History  Melanoma of scalp (HCC)  01/01/2015 Initial Diagnosis   Malignant melanoma (HCC)   12/2014 Surgery   April 2016 WLE and SLN 01/31/2015: Reexcision    02/25/2018 Imaging   CT CAP Subcentimeter right upper lobe subpleural and left upper lobe perifissural nodules, new from 01/24/2016, indeterminate. Recommend short interval follow-up (3 months), given histor    04/07/2019 Imaging   CT Chest: No evidence of metastatic disease within the chest. Left upper lobe perifissural nodule no longer evident. Right upper lobe subpleural nodule no longer evident.   CT AP No evidence of metastatic disease.     04/2020 Pathology Results   Summer 2021: New scalp lesion, in transit  path: -Malignant melanoma, involving dermal tissue    04/12/2020 Imaging   CT chest: NED CT AP: NED    05/04/2020 Imaging   CT Neck: No mass or lymphadenopathy within the neck.    05/25/2020 - 07/25/2020 Chemotherapy   5 cycles of TVEC   01/24/2021 PET scan   PET whole body - No abnormal foci of increased radiotracer uptake to suggest metastatic disease.  -Small focus of increased uptake within the proximal sigmoid colon without adjacent stranding or definite wall thickening, indeterminant. Recommend further evaluation with colonoscopy if clinically    08/24/2021 PET scan   PET whole body - No abnormal foci of increased radiotracer uptake to suggest metastatic disease.   - Small focus of increased uptake  within the proximal sigmoid colon, similar to 01/24/2021. Recommend clinical correlation and/or further evaluation with colonoscopy if clinically indicated    01/31/2022 Surgery   (Outside case, [phone], 1 slide, 01/23/2022) Skin, left inferior lateral anterior scalp, shave - Metastatic malignant melanoma Size: 2 mm Pattern: Pigmented epithelioid cells forming small plaque  in superficial dermis and showing epidermotropism Tumor infiltrating lymphocytes: Brisk Nuclear grade: 3/3 Margins: Focally present adjacent to edge of tissue sections    02/21/2022 - 05/09/2022 Chemotherapy   had an intransit recurrence of melanoma and was retreated with TVEC.  Biopsy in October 2023 showed NED.    05/09/2022 PET scan   - Mildly avid 3 mm right upper lobe subcentimeter pulmonary nodule, possibly increased from 2 mm on prior; recommend attention on follow-up.   -No definite evidence of metabolically active disease.    05/28/2022 Pathology Results   Diagnosis   Skin, Left temple, excision - Dermal fibrosis with lymphocytic and granulomatous inflammation consistent with site of recent therapy - No residual melanoma identified - Margins free of tumor      02/15/2023 PET scan   - Focal area of hypermetabolic activity is seen in the ascending colon, indeterminate. Recommend correlation with colonoscopy.   -Unchanged size of mildly hypermetabolic left paratracheal lymph node, indeterminate. Recommend attention on follow-up imaging.   -Pancreatic calcifications likely reflect sequela of chronic pancreatitis.   -Bladder diverticuli and enlarged prostate.    05/13/2023 PET scan   1.  Evidence of progressive disease as demonstrated by interval enlargement of right upper lobe pulmonary nodule most concerning for metastasis by melanoma. Additional single right pulmonary nodule and 2 sites of focal osseous avidity which are suspicious but indeterminate for additional sites of involvement by metastasis involving  the T8 and L1 vertebral bodies.  2.  Focal uptake within the sigmoid colon which has increased in conspicuity over time. Recommend correlation with colonoscopy if not recently completed with differential diagnosis including adenoma, carcinoma, and inflammatory lesion (given associated coarse calcifications/high density material). No evidence of complication (i.e.  fluid collection, or free air) by low-dose unenhanced CT.  3.  Persistent FDG abnormality of the left anterior shin associated with a dermal nodule. Recommend direct inspection as additional site of cutaneous neoplasm is not excluded.    06/14/2023 Pathology Results   A: Lung, right upper lobe, lobectomy  - Metastatic melanoma, multifocal (largest focus 2.4 cm, with two sub-centimeter nodules also identified microscopically, each 3 mm) - Surgical margins negative  - BRAF (VE1): Negative - TILs: brisk - Two lymph nodes, negative for malignancy (0/2)   07/17/2023 Genetic Testing   [facility]: pertinent negatives: BRAF. KIT TERT C.-124C>T variant- promoter mutation NRAS p.G13D Missense variant (exon 2) - GOF TP53 p.R342P ARID1A p.G285fs NF1 p.R1362* PHLPP2 p.Q300* NF1 p.K191_R192delinsN*  Multiple VUS findings.   08/05/2023 Imaging   MRI brain  --Single enhancing right occipital cortical lesion is most concerning for intracranial metastatic disease.   --Indeterminate 7 mm enhancing lesion in the right posterior parietal bone. There is no definite FDG avidity in this region on the recent PET/CT, arguing against malignancy, however close attention to this area on follow-up imaging is recommended.    08/05/2023 Imaging   CT chest IMPRESSION:   -Right upper lobectomy with moderate sized loculated right pleural effusion. New 0.3 cm right lower lobe micronodule. Recommend attention on follow-up CT in 3 months.   Few subcentimeter right infra-axillary lymph nodes with adjacent fat stranding and benign morphology, new since prior.  Recommendation follow-up CT.   Markedly enlarged left atrium and mild qualitative dilatation of right atrium. Extensive coronary artery calcifications.    09/06/2023 -  Chemotherapy   Patient is on Treatment Plan : METASTATIC MELANOMA Nivolumab  (240) q14d     09/19/2023 Cancer Staging   Staging form: Melanoma of the Skin, AJCC 7th  Edition - Clinical: Stage IV (TX, N0, M1c) - Signed by Tina [name], MD on 09/19/2023 Biopsy of metastatic site performed: Yes Source of metastatic specimen: Lung   11/19/2023 Imaging   MRI brain: Positive treatment response, previously seen brain lesions are no longer detectable. No new lesion.   11/21/2023 PET scan   PET: after 6C of nivo every 2 weeks. Marked response to therapy, including resolution of hepatic and osseous hypermetabolism.      PHYSICAL EXAMINATION: ECOG PERFORMANCE STATUS: 1 - Symptomatic but completely ambulatory  Vitals:   07/02/24 1140  BP: 132/71  Pulse: 62  Resp: 20  Temp: 97.7 F (36.5 C)  SpO2: 99%   Filed Weights   07/02/24 1140  Weight: 177 lb 3.2 oz (80.4 kg)    GENERAL: alert, no distress and comfortable SKIN: skin color normal and stable eczema LUNGS: clear to auscultation and no wheeze or rales with normal breathing effort HEART: regular rate & rhythm  ABDOMEN: abdomen soft, non-tender and nondistended. Musculoskeletal: no edema NEURO: no focal motor/sensory deficits  Relevant data reviewed during this visit included labs.  New labs ordered.

## 2024-07-01 NOTE — Assessment & Plan Note (Signed)
 Mild anemia. Ok to continue B12 500 mcg daily. Previously low-normal folate as well. Both improved

## 2024-07-01 NOTE — Assessment & Plan Note (Addendum)
 MRI scheduled for 11/18 ordered by Rad onc

## 2024-07-02 ENCOUNTER — Other Ambulatory Visit: Payer: Self-pay | Admitting: Student

## 2024-07-02 ENCOUNTER — Inpatient Hospital Stay

## 2024-07-02 ENCOUNTER — Telehealth: Payer: Self-pay | Admitting: Student in an Organized Health Care Education/Training Program

## 2024-07-02 VITALS — BP 132/71 | HR 62 | Temp 97.7°F | Resp 20 | Wt 177.2 lb

## 2024-07-02 DIAGNOSIS — C434 Malignant melanoma of scalp and neck: Secondary | ICD-10-CM

## 2024-07-02 DIAGNOSIS — C7931 Secondary malignant neoplasm of brain: Secondary | ICD-10-CM

## 2024-07-02 DIAGNOSIS — Z5112 Encounter for antineoplastic immunotherapy: Secondary | ICD-10-CM | POA: Diagnosis not present

## 2024-07-02 DIAGNOSIS — E538 Deficiency of other specified B group vitamins: Secondary | ICD-10-CM | POA: Diagnosis not present

## 2024-07-02 DIAGNOSIS — R7989 Other specified abnormal findings of blood chemistry: Secondary | ICD-10-CM | POA: Diagnosis not present

## 2024-07-02 LAB — CMP (CANCER CENTER ONLY)
ALT: 20 U/L (ref 0–44)
AST: 24 U/L (ref 15–41)
Albumin: 4.5 g/dL (ref 3.5–5.0)
Alkaline Phosphatase: 79 U/L (ref 38–126)
Anion gap: 8 (ref 5–15)
BUN: 20 mg/dL (ref 8–23)
CO2: 28 mmol/L (ref 22–32)
Calcium: 9.9 mg/dL (ref 8.9–10.3)
Chloride: 97 mmol/L — ABNORMAL LOW (ref 98–111)
Creatinine: 1.11 mg/dL (ref 0.61–1.24)
GFR, Estimated: >60 mL/min (ref >60)
Glucose, Bld: 130 mg/dL — ABNORMAL HIGH (ref 70–99)
Potassium: 3.9 mmol/L (ref 3.5–5.1)
Sodium: 133 mmol/L — ABNORMAL LOW (ref 135–145)
Total Bilirubin: 0.9 mg/dL (ref 0.0–1.2)
Total Protein: 7.6 g/dL (ref 6.5–8.1)

## 2024-07-02 LAB — CBC WITH DIFFERENTIAL (CANCER CENTER ONLY)
Abs Immature Granulocytes: 0.05 K/uL (ref 0.00–0.07)
Basophils Absolute: 0.1 K/uL (ref 0.0–0.1)
Basophils Relative: 1 %
Eosinophils Absolute: 0 K/uL (ref 0.0–0.5)
Eosinophils Relative: 0 %
HCT: 39.5 % (ref 39.0–52.0)
Hemoglobin: 14 g/dL (ref 13.0–17.0)
Immature Granulocytes: 1 %
Lymphocytes Relative: 15 %
Lymphs Abs: 1 K/uL (ref 0.7–4.0)
MCH: 35.5 pg — ABNORMAL HIGH (ref 26.0–34.0)
MCHC: 35.4 g/dL (ref 30.0–36.0)
MCV: 100.3 fL — ABNORMAL HIGH (ref 80.0–100.0)
Monocytes Absolute: 0.7 K/uL (ref 0.1–1.0)
Monocytes Relative: 10 %
Neutro Abs: 4.9 K/uL (ref 1.7–7.7)
Neutrophils Relative %: 73 %
Platelet Count: 243 K/uL (ref 150–400)
RBC: 3.94 MIL/uL — ABNORMAL LOW (ref 4.22–5.81)
RDW: 14.2 % (ref 11.5–15.5)
WBC Count: 6.7 K/uL (ref 4.0–10.5)
nRBC: 0 % (ref 0.0–0.2)

## 2024-07-02 LAB — TSH: TSH: 2.6 u[IU]/mL (ref 0.350–4.500)

## 2024-07-02 LAB — LACTATE DEHYDROGENASE: LDH: 142 U/L (ref 98–192)

## 2024-07-02 LAB — T4, FREE: Free T4: 1.02 ng/dL (ref 0.61–1.12)

## 2024-07-02 MED ORDER — SODIUM CHLORIDE 0.9 % IV SOLN
240.0000 mg | Freq: Once | INTRAVENOUS | Status: AC
  Administered 2024-07-02: 240 mg via INTRAVENOUS
  Filled 2024-07-02: qty 24

## 2024-07-02 MED ORDER — FUROSEMIDE 40 MG PO TABS: 40.0000 mg | ORAL_TABLET | ORAL | 3 refills | Status: DC | PRN

## 2024-07-02 MED ORDER — SODIUM CHLORIDE 0.9 % IV SOLN: INTRAVENOUS | Status: DC

## 2024-07-02 NOTE — Telephone Encounter (Signed)
 Called patient about message. Per Daphne Barrack NP note on 05/20/24, patient was going to stop amlodipine  and check BP. Patient stated that his SBP was going above 140's, so he started back is amlodipine  and now he is taking lasix  as needed every other day. Now patient needs refill on both amlodipine  and lasix . Will see if Daphne Barrack NP is okay to refill.  'Hypertension  -well controlled on current regimen  -he states he has discussed with PCP about coming off norvasc  due to LE swelling > we reviewed stopping it and using his PRN lasix . Patient instructed to check BP at home and notify his PCP if his SBP is consistently > 140, he indicates understanding  Patient would like a call back either way.

## 2024-07-02 NOTE — Patient Instructions (Signed)
 [facility] - A DEPT OF [facility]  Discharge Instructions: Thank you for choosing [facility] to provide your oncology and hematology care.   If you have a lab appointment with the Cancer Center, please go directly to the Cancer Center and check in at the registration area.   Wear comfortable clothing and clothing appropriate for easy access to any Portacath or PICC line.   We strive to give you quality time with your provider. You may need to reschedule your appointment if you arrive late (15 or more minutes).  Arriving late affects you and other patients whose appointments are after yours.  Also, if you miss three or more appointments without notifying the office, you may be dismissed from the clinic at the provider's discretion.      For prescription refill requests, have your pharmacy contact our office and allow 72 hours for refills to be completed.    Today you received the following chemotherapy and/or immunotherapy agents: nivolumab (OPDIVO)       To help prevent nausea and vomiting after your treatment, we encourage you to take your nausea medication as directed.  BELOW ARE SYMPTOMS THAT SHOULD BE REPORTED IMMEDIATELY: *FEVER GREATER THAN 100.4 F (38 C) OR HIGHER *CHILLS OR SWEATING *NAUSEA AND VOMITING THAT IS NOT CONTROLLED WITH YOUR NAUSEA MEDICATION *UNUSUAL SHORTNESS OF BREATH *UNUSUAL BRUISING OR BLEEDING *URINARY PROBLEMS (pain or burning when urinating, or frequent urination) *BOWEL PROBLEMS (unusual diarrhea, constipation, pain near the anus) TENDERNESS IN MOUTH AND THROAT WITH OR WITHOUT PRESENCE OF ULCERS (sore throat, sores in mouth, or a toothache) UNUSUAL RASH, SWELLING OR PAIN  UNUSUAL VAGINAL DISCHARGE OR ITCHING   Items with * indicate a potential emergency and should be followed up as soon as possible or go to the Emergency Department if any problems should occur.  Please show the CHEMOTHERAPY ALERT CARD or  IMMUNOTHERAPY ALERT CARD at check-in to the Emergency Department and triage nurse.  Should you have questions after your visit or need to cancel or reschedule your appointment, please contact [facility] - A DEPT OF [facility]  Dept: [phone]  and follow the prompts.  Office hours are 8:00 a.m. to 4:30 p.m. Monday - Friday. Please note that voicemails left after 4:00 p.m. may not be returned until the following business day.  We are closed weekends and major holidays. You have access to a nurse at all times for urgent questions. Please call the main number to the clinic Dept: [phone] and follow the prompts.   For any non-urgent questions, you may also contact your provider using MyChart. We now offer e-Visits for anyone 3 and older to request care online for non-urgent symptoms. For details visit [email or web address].   Also download the MyChart app! Go to the app store, search "MyChart", open the app, select [place], and log in with your MyChart username and password.

## 2024-07-02 NOTE — Telephone Encounter (Signed)
*  STAT* If patient is at the pharmacy, call can be transferred to refill team.   1. Which medications need to be refilled? (please list name of each medication and dose if known)   furosemide  (LASIX ) 40 MG tablet  amLODipine  (NORVASC ) 5 MG tablet [[id number]]  DISCONTINUED    2. Would you like to learn more about the convenience, safety, & potential cost savings by using the [facility]? No    3. Are you open to using the [facility] ([facility]. No    4. Which pharmacy/location (including street and city if local pharmacy) is medication to be sent to? [id number] - [place], [place] - [address]     5. Do they need a 30 day or 90 day supply? 90 day   Pt had appt 05/20/24

## 2024-07-06 ENCOUNTER — Telehealth: Payer: Self-pay | Admitting: Pulmonary Disease

## 2024-07-06 MED ORDER — AMLODIPINE BESYLATE 2.5 MG PO TABS: 2.5000 mg | ORAL_TABLET | Freq: Every day | ORAL | 0 refills | Status: DC

## 2024-07-06 NOTE — Telephone Encounter (Signed)
 Per previous phone note:   Nicholas Nicholas CROME, NP  [name],   These were originally filled by his PCP.  I am fine to fill them for a one time fill but these will need to be filled by his PCP moving forward.     Thank you,  Nicholas Bird with the patient and made him aware. Prescription has been sent in. He will get future refills from his PCP.

## 2024-07-06 NOTE — Telephone Encounter (Signed)
 Pt c/o medication issue:  1. Name of Medication:   amLODipine  (NORVASC ) 5 MG tablet   2. How are you currently taking this medication (dosage and times per day)?     3. Are you having a reaction (difficulty breathing--STAT)?   4. What is your medication issue?   Patient stated he was taken off this medication and his BP was trending high so he re-started taking this medication.  Patient wants to get a prescription for this medication at 2.5 mg dosage sent to [id number] - [place], [place] - [address]

## 2024-07-14 ENCOUNTER — Encounter: Payer: Self-pay | Admitting: Radiation Oncology

## 2024-07-15 NOTE — Assessment & Plan Note (Addendum)
 Continue immunotherapy with nivolumab  Monitor clinical signs of worsening immune related side effects Return in 2 weeks with labs visit and treatment Planning and PET scan on December 1st. Will call with results.

## 2024-07-15 NOTE — Progress Notes (Signed)
 [facility] OFFICE PROGRESS NOTE  Patient Care Team: [name], MD as PCP - General (Family Medicine) [name], MD (Inactive) as PCP - Electrophysiology (Cardiology) [name], MD (Inactive) (Cardiology) [name], MD as Attending Physician (Urology) [name], MD as Consulting Physician (Dermatology) [name], MD as Consulting Physician (Ophthalmology) [name], MD as Consulting Physician (Gastroenterology) [name], [facility] as Pharmacist (Pharmacist) [name], RN as [facility] Care Management  83 y.o.man undergoing nivolumab  for stage IV melanoma being follow up for metastatic melanoma on treatment.    Previous immune related side effects of diarrhea resolved. Previous polyarthritis resolved.  No recurrence.    Current Diagnosis: Metastatic melanoma with brain and lung metastases.  Suspicious for bone metastases. BRAF negative. 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 (Started 09/06/23) 08/19/23 SRS to 4 brain mets   Last PET showed NED from July 2025.    Treatment held after 7/3, and resumed on 8/21 due to inflammatory arthritis.  Given the recurrent joint pain, staying on prednisone  at 5 mg daily and able to tolerate additional treatment. Overall doing well with resuming treatment. No new side effects or concerns. Restaging imaging has been planned.   Continue monitor for irAE. Assessment & Plan Melanoma of scalp (HCC) Continue immunotherapy with nivolumab  Monitor clinical signs of worsening immune related side effects Return in 2 weeks with labs visit and treatment Planning and PET scan on December 1st. Will call with results. Polyarthritis Patient is on apixaban  for history of A-fib.   Will continue Prednisone  to 5 mg 3 times weekly. Refilled today.  Return as scheduled.    [name], MD  INTERVAL HISTORY: Patient returns for follow-up. No arthritis  flare. On prednisone  3 times a week. No heartburn, stomach pain, nausea, bloody stool or dark stool. Back itches more when cold from eczema and hydrocortisone help. No diarrhea, coughing.  Oncology History  Melanoma of scalp (HCC)  01/01/2015 Initial Diagnosis   Malignant melanoma (HCC)   12/2014 Surgery   April 2016 WLE and SLN 01/31/2015: Reexcision    02/25/2018 Imaging   CT CAP Subcentimeter right upper lobe subpleural and left upper lobe perifissural nodules, new from 01/24/2016, indeterminate. Recommend short interval follow-up (3 months), given histor    04/07/2019 Imaging   CT Chest: No evidence of metastatic disease within the chest. Left upper lobe perifissural nodule no longer evident. Right upper lobe subpleural nodule no longer evident.   CT AP No evidence of metastatic disease.     04/2020 Pathology Results   Summer 2021: New scalp lesion, in transit  path: -Malignant melanoma, involving dermal tissue    04/12/2020 Imaging   CT chest: NED CT AP: NED    05/04/2020 Imaging   CT Neck: No mass or lymphadenopathy within the neck.    05/25/2020 - 07/25/2020 Chemotherapy   5 cycles of TVEC   01/24/2021 PET scan   PET whole body - No abnormal foci of increased radiotracer uptake to suggest metastatic disease.  -Small focus of increased uptake within the proximal sigmoid colon without adjacent stranding or definite wall thickening, indeterminant. Recommend further evaluation with colonoscopy if clinically    08/24/2021 PET scan   PET whole body - No abnormal foci of increased radiotracer uptake to suggest metastatic disease.   - Small focus of increased uptake within the proximal sigmoid colon, similar to 01/24/2021. Recommend clinical correlation and/or further evaluation with colonoscopy if clinically indicated  01/31/2022 Surgery   (Outside case, [phone], 1 slide, 01/23/2022) Skin, left inferior lateral anterior scalp, shave - Metastatic malignant melanoma Size: 2  mm Pattern: Pigmented epithelioid cells forming small plaque in superficial dermis and showing epidermotropism Tumor infiltrating lymphocytes: Brisk Nuclear grade: 3/3 Margins: Focally present adjacent to edge of tissue sections    02/21/2022 - 05/09/2022 Chemotherapy   had an intransit recurrence of melanoma and was retreated with TVEC.  Biopsy in October 2023 showed NED.    05/09/2022 PET scan   - Mildly avid 3 mm right upper lobe subcentimeter pulmonary nodule, possibly increased from 2 mm on prior; recommend attention on follow-up.   -No definite evidence of metabolically active disease.    05/28/2022 Pathology Results   Diagnosis   Skin, Left temple, excision - Dermal fibrosis with lymphocytic and granulomatous inflammation consistent with site of recent therapy - No residual melanoma identified - Margins free of tumor      02/15/2023 PET scan   - Focal area of hypermetabolic activity is seen in the ascending colon, indeterminate. Recommend correlation with colonoscopy.   -Unchanged size of mildly hypermetabolic left paratracheal lymph node, indeterminate. Recommend attention on follow-up imaging.   -Pancreatic calcifications likely reflect sequela of chronic pancreatitis.   -Bladder diverticuli and enlarged prostate.    05/13/2023 PET scan   1.  Evidence of progressive disease as demonstrated by interval enlargement of right upper lobe pulmonary nodule most concerning for metastasis by melanoma. Additional single right pulmonary nodule and 2 sites of focal osseous avidity which are suspicious but indeterminate for additional sites of involvement by metastasis involving the T8 and L1 vertebral bodies.  2.  Focal uptake within the sigmoid colon which has increased in conspicuity over time. Recommend correlation with colonoscopy if not recently completed with differential diagnosis including adenoma, carcinoma, and inflammatory lesion (given associated coarse calcifications/high density  material). No evidence of complication (i.e.  fluid collection, or free air) by low-dose unenhanced CT.  3.  Persistent FDG abnormality of the left anterior shin associated with a dermal nodule. Recommend direct inspection as additional site of cutaneous neoplasm is not excluded.    06/14/2023 Pathology Results   A: Lung, right upper lobe, lobectomy  - Metastatic melanoma, multifocal (largest focus 2.4 cm, with two sub-centimeter nodules also identified microscopically, each 3 mm) - Surgical margins negative  - BRAF (VE1): Negative - TILs: brisk - Two lymph nodes, negative for malignancy (0/2)   07/17/2023 Genetic Testing   [place]: pertinent negatives: BRAF. KIT TERT C.-124C>T variant- promoter mutation NRAS p.G13D Missense variant (exon 2) - GOF TP53 p.R342P ARID1A p.G285fs NF1 p.R1362* PHLPP2 p.Q300* NF1 p.K191_R192delinsN*  Multiple VUS findings.   08/05/2023 Imaging   MRI brain  --Single enhancing right occipital cortical lesion is most concerning for intracranial metastatic disease.   --Indeterminate 7 mm enhancing lesion in the right posterior parietal bone. There is no definite FDG avidity in this region on the recent PET/CT, arguing against malignancy, however close attention to this area on follow-up imaging is recommended.    08/05/2023 Imaging   CT chest IMPRESSION:   -Right upper lobectomy with moderate sized loculated right pleural effusion. New 0.3 cm right lower lobe micronodule. Recommend attention on follow-up CT in 3 months.   Few subcentimeter right infra-axillary lymph nodes with adjacent fat stranding and benign morphology, new since prior. Recommendation follow-up CT.   Markedly enlarged left atrium and mild qualitative dilatation of right atrium. Extensive coronary artery calcifications.    09/06/2023 -  Chemotherapy   Patient is on Treatment Plan : METASTATIC MELANOMA Nivolumab  (240) q14d     09/19/2023 Cancer Staging   Staging form: Melanoma of the Skin,  AJCC 7th Edition - Clinical: Stage IV (TX, N0, M1c) - Signed by Tina [name], MD on 09/19/2023 Biopsy of metastatic site performed: Yes Source of metastatic specimen: Lung   11/19/2023 Imaging   MRI brain: Positive treatment response, previously seen brain lesions are no longer detectable. No new lesion.   11/21/2023 PET scan   PET: after 6C of nivo every 2 weeks. Marked response to therapy, including resolution of hepatic and osseous hypermetabolism.      PHYSICAL EXAMINATION: ECOG PERFORMANCE STATUS: 1  Vitals:   07/16/24 1121  BP: 134/76  Pulse: 91  Resp: 20  Temp: (!) 97.5 F (36.4 C)  SpO2: 99%   Filed Weights   07/16/24 1121  Weight: 180 lb 6.4 oz (81.8 kg)    GENERAL: alert, no distress and comfortable SKIN: skin color normal and no jaundice EYES: normal, sclera clear OROPHARYNX: no exudate  NECK: No palpable mass LYMPH:  no palpable cervical lymphadenopathy  LUNGS: clear to auscultation and no wheeze or rales with normal breathing effort HEART: regular rate & rhythm  ABDOMEN: abdomen soft, non-tender and nondistended. Musculoskeletal: no edema NEURO: no focal motor/sensory deficits  Relevant data reviewed during this visit included labs.  New labs ordered.

## 2024-07-16 ENCOUNTER — Inpatient Hospital Stay

## 2024-07-16 VITALS — BP 120/65 | HR 73 | Temp 97.8°F | Resp 20

## 2024-07-16 VITALS — BP 134/76 | HR 91 | Temp 97.5°F | Resp 20 | Wt 180.4 lb

## 2024-07-16 DIAGNOSIS — C7800 Secondary malignant neoplasm of unspecified lung: Secondary | ICD-10-CM | POA: Insufficient documentation

## 2024-07-16 DIAGNOSIS — C434 Malignant melanoma of scalp and neck: Secondary | ICD-10-CM | POA: Insufficient documentation

## 2024-07-16 DIAGNOSIS — I4891 Unspecified atrial fibrillation: Secondary | ICD-10-CM | POA: Insufficient documentation

## 2024-07-16 DIAGNOSIS — Z79899 Other long term (current) drug therapy: Secondary | ICD-10-CM | POA: Insufficient documentation

## 2024-07-16 DIAGNOSIS — Z5112 Encounter for antineoplastic immunotherapy: Secondary | ICD-10-CM | POA: Insufficient documentation

## 2024-07-16 DIAGNOSIS — M130 Polyarthritis, unspecified: Secondary | ICD-10-CM | POA: Insufficient documentation

## 2024-07-16 DIAGNOSIS — C7931 Secondary malignant neoplasm of brain: Secondary | ICD-10-CM | POA: Diagnosis not present

## 2024-07-16 LAB — CBC WITH DIFFERENTIAL (CANCER CENTER ONLY)
Abs Immature Granulocytes: 0.05 K/uL (ref 0.00–0.07)
Basophils Absolute: 0.1 K/uL (ref 0.0–0.1)
Basophils Relative: 1 %
Eosinophils Absolute: 0.1 K/uL (ref 0.0–0.5)
Eosinophils Relative: 2 %
HCT: 38.6 % — ABNORMAL LOW (ref 39.0–52.0)
Hemoglobin: 13.4 g/dL (ref 13.0–17.0)
Immature Granulocytes: 1 %
Lymphocytes Relative: 17 %
Lymphs Abs: 1.3 K/uL (ref 0.7–4.0)
MCH: 35.3 pg — ABNORMAL HIGH (ref 26.0–34.0)
MCHC: 34.7 g/dL (ref 30.0–36.0)
MCV: 101.6 fL — ABNORMAL HIGH (ref 80.0–100.0)
Monocytes Absolute: 1 K/uL (ref 0.1–1.0)
Monocytes Relative: 13 %
Neutro Abs: 5.2 K/uL (ref 1.7–7.7)
Neutrophils Relative %: 66 %
Platelet Count: 243 K/uL (ref 150–400)
RBC: 3.8 MIL/uL — ABNORMAL LOW (ref 4.22–5.81)
RDW: 14.1 % (ref 11.5–15.5)
WBC Count: 7.7 K/uL (ref 4.0–10.5)
nRBC: 0 % (ref 0.0–0.2)

## 2024-07-16 LAB — CMP (CANCER CENTER ONLY)
ALT: 34 U/L (ref 0–44)
AST: 37 U/L (ref 15–41)
Albumin: 4.4 g/dL (ref 3.5–5.0)
Alkaline Phosphatase: 67 U/L (ref 38–126)
Anion gap: 9 (ref 5–15)
BUN: 22 mg/dL (ref 8–23)
CO2: 25 mmol/L (ref 22–32)
Calcium: 9.7 mg/dL (ref 8.9–10.3)
Chloride: 98 mmol/L (ref 98–111)
Creatinine: 1.24 mg/dL (ref 0.61–1.24)
GFR, Estimated: 58 mL/min — ABNORMAL LOW (ref >60)
Glucose, Bld: 121 mg/dL — ABNORMAL HIGH (ref 70–99)
Potassium: 4.2 mmol/L (ref 3.5–5.1)
Sodium: 132 mmol/L — ABNORMAL LOW (ref 135–145)
Total Bilirubin: 0.8 mg/dL (ref 0.0–1.2)
Total Protein: 7.2 g/dL (ref 6.5–8.1)

## 2024-07-16 LAB — TSH: TSH: 2.27 u[IU]/mL (ref 0.350–4.500)

## 2024-07-16 LAB — T4, FREE: Free T4: 0.96 ng/dL (ref 0.61–1.12)

## 2024-07-16 LAB — LACTATE DEHYDROGENASE: LDH: 153 U/L (ref 105–235)

## 2024-07-16 MED ORDER — SODIUM CHLORIDE 0.9 % IV SOLN: INTRAVENOUS | Status: DC

## 2024-07-16 MED ORDER — SODIUM CHLORIDE 0.9 % IV SOLN
240.0000 mg | Freq: Once | INTRAVENOUS | Status: AC
  Administered 2024-07-16: 240 mg via INTRAVENOUS
  Filled 2024-07-16: qty 24

## 2024-07-16 NOTE — Assessment & Plan Note (Addendum)
 Patient is on apixaban  for history of A-fib.   Will continue Prednisone  to 5 mg 3 times weekly. Refilled today.

## 2024-07-21 ENCOUNTER — Ambulatory Visit
Admission: RE | Admit: 2024-07-21 | Discharge: 2024-07-21 | Disposition: A | Discharge status: Home / Self Care | Source: Ambulatory Visit | Attending: Radiation Oncology | Admitting: Radiation Oncology

## 2024-07-21 DIAGNOSIS — C801 Malignant (primary) neoplasm, unspecified: Secondary | ICD-10-CM | POA: Diagnosis not present

## 2024-07-21 DIAGNOSIS — C7931 Secondary malignant neoplasm of brain: Secondary | ICD-10-CM | POA: Diagnosis not present

## 2024-07-21 MED ORDER — GADOPICLENOL 0.5 MMOL/ML IV SOLN
10.0000 mL | Freq: Once | INTRAVENOUS | Status: AC | PRN
  Administered 2024-07-21: 8 mL via INTRAVENOUS

## 2024-07-22 ENCOUNTER — Encounter: Payer: Self-pay | Admitting: Family Medicine

## 2024-07-22 ENCOUNTER — Ambulatory Visit (INDEPENDENT_AMBULATORY_CARE_PROVIDER_SITE_OTHER): Admitting: Family Medicine

## 2024-07-22 VITALS — BP 120/78 | HR 108 | Temp 97.8°F | Ht 71.0 in | Wt 179.2 lb

## 2024-07-22 DIAGNOSIS — I480 Paroxysmal atrial fibrillation: Secondary | ICD-10-CM

## 2024-07-22 DIAGNOSIS — I10 Essential (primary) hypertension: Secondary | ICD-10-CM

## 2024-07-22 DIAGNOSIS — J329 Chronic sinusitis, unspecified: Secondary | ICD-10-CM | POA: Diagnosis not present

## 2024-07-22 MED ORDER — AMLODIPINE BESYLATE 2.5 MG PO TABS: 2.5000 mg | ORAL_TABLET | Freq: Every day | ORAL | 4 refills | Status: AC

## 2024-07-22 MED ORDER — AMOXICILLIN-POT CLAVULANATE 875-125 MG PO TABS: 1.0000 | ORAL_TABLET | Freq: Two times a day (BID) | ORAL | 0 refills | Status: DC

## 2024-07-22 MED ORDER — FUROSEMIDE 40 MG PO TABS: 40.0000 mg | ORAL_TABLET | ORAL | 4 refills | Status: AC | PRN

## 2024-07-22 MED ORDER — AZELASTINE HCL 0.1 % NA SOLN: 2.0000 | Freq: Two times a day (BID) | NASAL | 12 refills | Status: DC

## 2024-07-22 NOTE — Assessment & Plan Note (Signed)
Follows with cardiology.  Rate controlled on metoprolol and anticoagulated on Eliquis.

## 2024-07-22 NOTE — Patient Instructions (Signed)
 It was very nice to see you today!  VISIT SUMMARY: Today, you were seen for congestion and a cough that you've had for the past four days. We also discussed your blood pressure management and your upcoming medical appointments.  YOUR PLAN: ACUTE UPPER RESPIRATORY INFECTION WITH COUGH AND CONGESTION: You have been experiencing congestion, cough, and post-nasal drip for four days. Your lungs are clear, but a sinus infection is possible. -Start using Asplan nasal spray to help with mucus and congestion. -If your symptoms do not improve after using the nasal spray for a few days, start taking Augmentin  for a potential sinus infection. -Report back if your symptoms do not improve by next week.  ESSENTIAL HYPERTENSION: Your blood pressure is well-controlled with your current medication regimen. -Continue taking amlodipine  at the reduced dose of 2.5 mg daily. -Refill your prescriptions for furosemide  and amlodipine .  Return if symptoms worsen or fail to improve.   Take care, Dr Kennyth  PLEASE NOTE:  If you had any lab tests, please let us  know if you have not heard back within a few days. You may see your results on mychart before we have a chance to review them but we will give you a call once they are reviewed by us .   If we ordered any referrals today, please let us  know if you have not heard from their office within the next week.   If you had any urgent prescriptions sent in today, please check with the pharmacy within an hour of our visit to make sure the prescription was transmitted appropriately.   Please try these tips to maintain a healthy lifestyle:  Eat at least 3 REAL meals and 1-2 snacks per day.  Aim for no more than 5 hours between eating.  If you eat breakfast, please do so within one hour of getting up.   Each meal should contain half fruits/vegetables, one quarter protein, and one quarter carbs (no bigger than a computer mouse)  Cut down on sweet beverages. This includes  juice, soda, and sweet tea.   Drink at least 1 glass of water  with each meal and aim for at least 8 glasses per day  Exercise at least 150 minutes every week.

## 2024-07-22 NOTE — Progress Notes (Signed)
   Nicholas Bird is a 83 y.o. male who presents today for an office visit.  Assessment/Plan:  New/Acute Problems: Sinusitis  No red flags.  Exam consistent with sinusitis.  No signs of systemic illness or respiratory distress.  Will start Astelin nasal spray.  Will send pocket prescription in for Augmentin .  He can use over-the-counter meds as needed.  We discussed reasons return to care.  Follow-up as needed.  Chronic Problems Addressed Today: HTN (hypertension) Blood pressure at goal today on amlodipine  2.5 mg daily, valsartan  80 mg daily and metoprolol  succinate 50 mg daily.  Will refill amlodipine  today per patient request.  Atrial fibrillation [facility]) Follows with cardiology.  Rate controlled on metoprolol  and anticoagulated on Eliquis .     Subjective:  HPI:  See assessment / plan for status of chronic conditions.   Discussed the use of AI scribe software for clinical note transcription with the patient, who gave verbal consent to proceed.  History of Present Illness Nicholas Bird is an 83 year old male with hypertension who presents with congestion and cough.  He has been experiencing congestion and a cough for the past four days, with some improvement noted this morning. The symptoms began with an itchy throat, followed by nasal congestion and post-nasal drip leading to a cough. He has not tried any treatments yet, as he initially thought it was a common cold. No fever, with his temperature remaining around 97.83F. He reports intermittent difficulty breathing due to congestion, which affects his sleep.  He denies fever, chills, and ear pain or pressure. He reports intermittent nasal congestion affecting his breathing and sleep.  He mentions a change in his cardiologist and a request to transfer the management of his furosemide  and amlodipine  prescriptions. His amlodipine  dose was reduced from 5 mg to 2.5 mg daily, which maintains his blood pressure around 120-125 mmHg  systolic. He notes that his blood pressure increases to around 150 mmHg when he discontinues amlodipine , as experienced after surgery last year.         Objective:  Physical Exam: BP 120/78   Pulse (!) 108   Temp 97.8 F (36.6 C) (Temporal)   Ht 5' 11 (1.803 m)   Wt 179 lb 3.2 oz (81.3 kg)   SpO2 98%   BMI 24.99 kg/m   Gen: No acute distress, resting comfortably HEENT: TMs clear.  OP erythematous.  Nasal mucosa erythematous and boggy bilaterally. CV: Irregular with no murmurs appreciated Pulm: Normal work of breathing, clear to auscultation bilaterally with no crackles, wheezes, or rhonchi Neuro: Grossly normal, moves all extremities Psych: Normal affect and thought content      Makalyn Lennox M. Kennyth, MD 07/22/2024 10:17 AM

## 2024-07-22 NOTE — Assessment & Plan Note (Signed)
 Blood pressure at goal today on amlodipine  2.5 mg daily, valsartan  80 mg daily and metoprolol  succinate 50 mg daily.  Will refill amlodipine  today per patient request.

## 2024-07-24 NOTE — Progress Notes (Signed)
 Follow up call to discuss brain MRI results from 07/21/24.  Pain/headaches/vision?  IMPRESSION: 1. Persistent linear cortical enhancement along the right angular gyrus, unchanged. 2. Unchanged enhancing lesions in the right parietal bone abutting the dura and along the right lambdoid suture.

## 2024-07-27 ENCOUNTER — Inpatient Hospital Stay

## 2024-07-29 ENCOUNTER — Ambulatory Visit
Admission: RE | Admit: 2024-07-29 | Discharge: 2024-07-29 | Disposition: A | Discharge status: Home / Self Care | Source: Ambulatory Visit | Attending: Urology | Admitting: Urology

## 2024-07-29 ENCOUNTER — Other Ambulatory Visit: Payer: Self-pay | Admitting: Radiation Therapy

## 2024-07-29 DIAGNOSIS — C434 Malignant melanoma of scalp and neck: Secondary | ICD-10-CM | POA: Diagnosis not present

## 2024-07-29 DIAGNOSIS — C7931 Secondary malignant neoplasm of brain: Secondary | ICD-10-CM

## 2024-07-29 DIAGNOSIS — C439 Malignant melanoma of skin, unspecified: Secondary | ICD-10-CM

## 2024-07-29 NOTE — Progress Notes (Signed)
 Radiation Oncology         [phone] ________________________________  Name: Nicholas Bird MRN: [id number]  Date: 07/29/2024  DOB: 1941-05-29  Post Treatment Note  CC: Nicholas Worth HERO, MD  Nicholas Worth HERO, MD  Diagnosis:   83 year old man with brain metastases secondary to stage IV malignant melanoma   Interval Since Last Radiation:  11 months  08/19/23:  The 4 targets in the brain were treated to 20 Gy in a single fraction of SRS.  Narrative:  I spoke with the patient to conduct his scheduled short interval follow up visit to review the results of his recent post-treatment MRI brain scan via telephone to spare the patient unnecessary potential exposure in the healthcare setting during the current COVID-19 pandemic.  The patient was notified in advance and gave permission to proceed with this visit format.  He tolerated the treatment well and was discharged home in stable condition.  He has continued without complaints. His post-treatment MRI brain scan from 05/21/24 continued to show an excellent treatment response with resolution of the previously treated lesions and no definite evidence of new lesions but there was a new, 8 mm linear enhancement in the right temporoparietal region that was felt most likely to represent a small infarct or treatment related changes, unlikely new or progressive disease. His restaging PET scan from 11/21/23 also confirmed an excellent treatment response to his systemic immunotherapy with resolution of the hypermetabolic activity in the liver and bone lesions.  He remains on nivolumab  immunotherapy, started 09/06/2023, under the care of Dr. Tina.  His most recent restaging PET imaging from July 2025 did not show any evidence of active disease.  We decided to do a short interval MRI brain scan to closely observe the new 8 mm linear enhancement in the right temporoparietal region and this was performed on 07/21/2024.  This scan shows a stable appearance of the 8  mm linear enhancement which is persistent but unchanged and there is no associated edema.  We reviewed the imaging in our multidisciplinary brain conference on 07/27/2024 and consensus recommendation was just to continue to observe with a repeat MRI brain scan in 3 months. We reviewed the results and recommendations today by telephone.                          On review of systems, the patient states that he is doing very well in general and is without complaints.  He specifically denies headaches, dizziness, N/V, imbalance or difficulty with speech.  He has not noted any focal weakness and overall, remains pleased with his progress to date.  ALLERGIES:  is allergic to pertussis vaccines.  Meds: Current Outpatient Medications  Medication Sig Dispense Refill   allopurinol  (ZYLOPRIM ) 300 MG tablet TAKE 1 TABLET BY MOUTH EVERY DAY 90 tablet 0   amLODipine  (NORVASC ) 2.5 MG tablet Take 1 tablet (2.5 mg total) by mouth daily. 90 tablet 4   amoxicillin -clavulanate (AUGMENTIN ) 875-125 MG tablet Take 1 tablet by mouth 2 (two) times daily. 20 tablet 0   ampicillin (PRINCIPEN) 500 MG capsule Take 500 mg by mouth 2 (two) times daily as needed.     apixaban  (ELIQUIS ) 5 MG TABS tablet Take 1 tablet (5 mg total) by mouth 2 (two) times daily. 180 tablet 2   azelastine  (ASTELIN ) 0.1 % nasal spray Place 2 sprays into both nostrils 2 (two) times daily. 30 mL 12   ferrous sulfate 325 (65 FE)  MG tablet Take 325 mg by mouth daily.     finasteride (PROSCAR) 5 MG tablet Take 5 mg by mouth.     furosemide  (LASIX ) 40 MG tablet Take 1 tablet (40 mg total) by mouth every other day as needed. 90 tablet 4   metoprolol  succinate (TOPROL -XL) 50 MG 24 hr tablet TAKE 1 TABLET BY MOUTH EVERY DAY 90 tablet 3   predniSONE  (DELTASONE ) 5 MG tablet Take 1 tablet (5 mg total) by mouth 3 (three) times a week. 60 tablet 0   tamsulosin  (FLOMAX ) 0.4 MG CAPS capsule Take 1 capsule (0.4 mg total) by mouth daily. 30 capsule 0   valsartan   (DIOVAN ) 80 MG tablet TAKE 1 TABLET BY MOUTH EVERY DAY 90 tablet 3   No current facility-administered medications for this visit.    Physical Findings:  vitals were not taken for this visit.   /10 Unable to assess due to telephone follow-up visit format.  Lab Findings: Lab Results  Component Value Date   WBC 7.7 07/16/2024   HGB 13.4 07/16/2024   HCT 38.6 (L) 07/16/2024   MCV 101.6 (H) 07/16/2024   PLT 243 07/16/2024     Radiographic Findings: MR Brain W Wo Contrast Result Date: 07/24/2024 EXAM: MRI BRAIN WITH AND WITHOUT CONTRAST 07/21/2024 11:36:51 AM TECHNIQUE: Multiplanar multisequence MRI of the head/brain was performed with and without the administration of intravenous contrast. CONTRAST: 8 mL of Vueway . COMPARISON: MR Head 05/21/2024. CLINICAL HISTORY: Brain metastases, assess treatment response, 3T SRS Protocol - short interval follow-up. FINDINGS: BRAIN AND VENTRICLES: No acute infarct. No acute intracranial hemorrhage. No mass effect or midline shift. No hydrocephalus. The sella is unremarkable. Normal flow voids. Persistent focus of linear cortical enhancement along the right angular gyrus seen on axial image 90 series 13. No other mass or abnormal enhancement. ORBITS: No acute abnormality. SINUSES: No acute abnormality. BONES AND SOFT TISSUES: Normal bone marrow signal and enhancement. Unchanged and enhancing lesion in the right parietal bone abutting the dura on axial image 144 series 13. Unchanged and enhancing lesion along the right lambdoid suture on axial image 75 series 13. No acute soft tissue abnormality. IMPRESSION: 1. Persistent linear cortical enhancement along the right angular gyrus, unchanged. 2. Unchanged enhancing lesions in the right parietal bone abutting the dura and along the right lambdoid suture. Electronically signed by: Ryan Chess MD 07/24/2024 11:47 AM EST RP Workstation: [id number]    Impression/Plan: 47. 83 year old man with brain metastases  secondary to stage IV malignant melanoma. He tolerated the [facility] treatment very well and remains without complaints.  We reviewed the results of his recent posttreatment MRI brain scan which continues to show an excellent treatment response with resolution of the treated lesions and a stable appearance of the 8 mm linear focus of cortical enhancement in the right temporoparietal region that was felt most likely to be treatment related changes versus a small subacute infarct as opposed to a new metastasis when reviewed in our multidisciplinary conference.  The recommendation is to continue to monitor for any evidence of disease progression or recurrence with serial MRI brain scans every 3 months.  I will plan to follow-up by telephone following the scan to review the results and recommendations from the multidisciplinary brain conference.  He knows that he is welcome to call at anytime with any questions or concerns in the interim.  I personally spent 20 minutes in this encounter including chart review, reviewing radiological studies, telephone conversation with the patient, entering orders and completing documentation.  Sabra MICAEL Rusk, PA-C

## 2024-08-03 ENCOUNTER — Encounter (HOSPITAL_COMMUNITY)
Admission: RE | Admit: 2024-08-03 | Discharge: 2024-08-03 | Disposition: A | Discharge status: Home / Self Care | Source: Ambulatory Visit

## 2024-08-03 DIAGNOSIS — C434 Malignant melanoma of scalp and neck: Secondary | ICD-10-CM | POA: Diagnosis not present

## 2024-08-03 DIAGNOSIS — C7951 Secondary malignant neoplasm of bone: Secondary | ICD-10-CM | POA: Diagnosis not present

## 2024-08-03 DIAGNOSIS — C787 Secondary malignant neoplasm of liver and intrahepatic bile duct: Secondary | ICD-10-CM | POA: Diagnosis not present

## 2024-08-03 LAB — GLUCOSE, CAPILLARY: Glucose-Capillary: 114 mg/dL — ABNORMAL HIGH (ref 70–99)

## 2024-08-03 MED ORDER — FLUDEOXYGLUCOSE F - 18 (FDG) INJECTION
9.0000 | Freq: Once | INTRAVENOUS | Status: AC
  Administered 2024-08-03: 8.88 via INTRAVENOUS

## 2024-08-06 NOTE — Progress Notes (Unsigned)
 Patient Care Team: [name], MD as PCP - General (Family Medicine) [name], MD (Inactive) as PCP - Electrophysiology (Cardiology) [name], MD (Inactive) (Cardiology) [name], MD as Attending Physician (Urology) [name], MD as Consulting Physician (Dermatology) [name], MD as Consulting Physician (Ophthalmology) [name], MD as Consulting Physician (Gastroenterology) [name], [facility] as Pharmacist (Pharmacist) [name], RN as [facility] Care Management  Clinic Day:  08/07/2024  Referring physician: Tina [name], MD  ASSESSMENT & PLAN:   Assessment & Plan: Melanoma of scalp [facility]) Continue immunotherapy with nivolumab  Monitor clinical signs of worsening immune related side effects Return in 2 weeks with labs visit and treatment PET scan done on 08/03/2024.  Results are pending at time of visit.  Dr. Tina to review results with patient when available.   Sinus infection Patient recently saw primary care due to nasal congestion.  Treated with amoxicillin  and nasal spray.  Feeling much better though still has moderate congestion.  Continue to use nasal spray as needed.  Joint pain Patient reports improved joint pain.  Has been able to cut prednisone  to 5 mg 3 times weekly.  Tolerating this dose well.  Not needing to use Tylenol or other pain medications to treat breakthrough pain.  Plan Labs reviewed. -CBC unremarkable. -T. bili 1.8 with remainder of CMP unremarkable. - LDH normal at 135. --Consulted with Dr. Autumn.  He gave okay to proceed with treating with treatment with T. bili level of 1.8. Will monitor closely. Patient labs and presentation are appropriate for treatment today. Proceed with immunotherapy nivolumab . Awaiting waiting results of scan done earlier this week.  Dr. Tina to review with patient when results are available. Plan for labs/flush, follow up, and treatment as scheduled.   The  patient understands the plans discussed today and is in agreement with them.  He knows to contact our office if he develops concerns prior to his next appointment.  I provided 25 minutes of face-to-face time during this encounter and > 50% was spent counseling as documented under my assessment and plan.    Powell FORBES Lessen, NP  [facility] - A DEPT OF [facility] [address] [place] [address] Dept: [phone] Dept Fax: [phone]   No orders of the defined types were placed in this encounter.     CHIEF COMPLAINT:  CC: Melanoma of scalp  Current Treatment: Immunotherapy with nivolumab   INTERVAL HISTORY:  Samyak is here today for repeat clinical assessment.  Patient was last seen by Dr. Tina on 07/16/2024.  The patient states on prednisone  5 mg daily to manage inflammatory arthritis associated with immunotherapy.  Patient's joint pain is well-managed.  Back, patient reports no pain.  Currently taking prednisone  5 mg 3 times weekly.  Has not even required use of Tylenol to manage pain.  States he was recently treated for sinus infection per his primary care.  Just finished antibiotics amoxicillin  and nasal spray.  He reports persistent nasal congestion but feeling better.   denies chest pain, chest pressure, or shortness of breath. He denies headaches or visual disturbances. He denies abdominal pain, nausea, vomiting, or changes in bowel or bladder habits.  He denies fevers or chills. His appetite is good. His weight has been stable.  I have reviewed the past medical history, past surgical history, social history and family history with the patient and they are unchanged from previous  note.  ALLERGIES:  is allergic to pertussis vaccines.  MEDICATIONS:  Current Outpatient Medications  Medication Sig Dispense Refill   allopurinol  (ZYLOPRIM ) 300 MG tablet TAKE 1 TABLET BY MOUTH EVERY DAY 90 tablet 0   amLODipine   (NORVASC ) 2.5 MG tablet Take 1 tablet (2.5 mg total) by mouth daily. 90 tablet 4   ampicillin (PRINCIPEN) 500 MG capsule Take 500 mg by mouth 2 (two) times daily as needed.     apixaban  (ELIQUIS ) 5 MG TABS tablet Take 1 tablet (5 mg total) by mouth 2 (two) times daily. 180 tablet 2   azelastine  (ASTELIN ) 0.1 % nasal spray Place 2 sprays into both nostrils 2 (two) times daily. 30 mL 12   ferrous sulfate 325 (65 FE) MG tablet Take 325 mg by mouth daily.     finasteride (PROSCAR) 5 MG tablet Take 5 mg by mouth.     furosemide  (LASIX ) 40 MG tablet Take 1 tablet (40 mg total) by mouth every other day as needed. 90 tablet 4   metoprolol  succinate (TOPROL -XL) 50 MG 24 hr tablet TAKE 1 TABLET BY MOUTH EVERY DAY 90 tablet 3   predniSONE  (DELTASONE ) 5 MG tablet Take 1 tablet (5 mg total) by mouth 3 (three) times a week. 60 tablet 0   tamsulosin  (FLOMAX ) 0.4 MG CAPS capsule Take 1 capsule (0.4 mg total) by mouth daily. 30 capsule 0   valsartan  (DIOVAN ) 80 MG tablet TAKE 1 TABLET BY MOUTH EVERY DAY 90 tablet 3   amoxicillin -clavulanate (AUGMENTIN ) 875-125 MG tablet Take 1 tablet by mouth 2 (two) times daily. (Patient not taking: Reported on 08/07/2024) 20 tablet 0   No current facility-administered medications for this visit.   Facility-Administered Medications Ordered in Other Visits  Medication Dose Route Frequency Provider Last Rate Last Admin   0.9 %  sodium chloride  infusion   Intravenous Continuous Tina [name], MD   Stopped at 08/07/24 1154    HISTORY OF PRESENT ILLNESS:   Oncology History  Melanoma of scalp (HCC)  01/01/2015 Initial Diagnosis   Malignant melanoma (HCC)   12/2014 Surgery   April 2016 WLE and SLN 01/31/2015: Reexcision    02/25/2018 Imaging   CT CAP Subcentimeter right upper lobe subpleural and left upper lobe perifissural nodules, new from 01/24/2016, indeterminate. Recommend short interval follow-up (3 months), given histor    04/07/2019 Imaging   CT Chest: No evidence of  metastatic disease within the chest. Left upper lobe perifissural nodule no longer evident. Right upper lobe subpleural nodule no longer evident.   CT AP No evidence of metastatic disease.     04/2020 Pathology Results   Summer 2021: New scalp lesion, in transit  path: -Malignant melanoma, involving dermal tissue    04/12/2020 Imaging   CT chest: NED CT AP: NED    05/04/2020 Imaging   CT Neck: No mass or lymphadenopathy within the neck.    05/25/2020 - 07/25/2020 Chemotherapy   5 cycles of TVEC   01/24/2021 PET scan   PET whole body - No abnormal foci of increased radiotracer uptake to suggest metastatic disease.  -Small focus of increased uptake within the proximal sigmoid colon without adjacent stranding or definite wall thickening, indeterminant. Recommend further evaluation with colonoscopy if clinically    08/24/2021 PET scan   PET whole body - No abnormal foci of increased radiotracer uptake to suggest metastatic disease.   - Small focus of increased uptake within the proximal sigmoid colon, similar to 01/24/2021. Recommend clinical correlation and/or further evaluation  with colonoscopy if clinically indicated    01/31/2022 Surgery   (Outside case, [id number], 1 slide, 01/23/2022) Skin, left inferior lateral anterior scalp, shave - Metastatic malignant melanoma Size: 2 mm Pattern: Pigmented epithelioid cells forming small plaque in superficial dermis and showing epidermotropism Tumor infiltrating lymphocytes: Brisk Nuclear grade: 3/3 Margins: Focally present adjacent to edge of tissue sections    02/21/2022 - 05/09/2022 Chemotherapy   had an intransit recurrence of melanoma and was retreated with TVEC.  Biopsy in October 2023 showed NED.    05/09/2022 PET scan   - Mildly avid 3 mm right upper lobe subcentimeter pulmonary nodule, possibly increased from 2 mm on prior; recommend attention on follow-up.   -No definite evidence of metabolically active disease.    05/28/2022  Pathology Results   Diagnosis   Skin, Left temple, excision - Dermal fibrosis with lymphocytic and granulomatous inflammation consistent with site of recent therapy - No residual melanoma identified - Margins free of tumor      02/15/2023 PET scan   - Focal area of hypermetabolic activity is seen in the ascending colon, indeterminate. Recommend correlation with colonoscopy.   -Unchanged size of mildly hypermetabolic left paratracheal lymph node, indeterminate. Recommend attention on follow-up imaging.   -Pancreatic calcifications likely reflect sequela of chronic pancreatitis.   -Bladder diverticuli and enlarged prostate.    05/13/2023 PET scan   1.  Evidence of progressive disease as demonstrated by interval enlargement of right upper lobe pulmonary nodule most concerning for metastasis by melanoma. Additional single right pulmonary nodule and 2 sites of focal osseous avidity which are suspicious but indeterminate for additional sites of involvement by metastasis involving the T8 and L1 vertebral bodies.  2.  Focal uptake within the sigmoid colon which has increased in conspicuity over time. Recommend correlation with colonoscopy if not recently completed with differential diagnosis including adenoma, carcinoma, and inflammatory lesion (given associated coarse calcifications/high density material). No evidence of complication (i.e.  fluid collection, or free air) by low-dose unenhanced CT.  3.  Persistent FDG abnormality of the left anterior shin associated with a dermal nodule. Recommend direct inspection as additional site of cutaneous neoplasm is not excluded.    06/14/2023 Pathology Results   A: Lung, right upper lobe, lobectomy  - Metastatic melanoma, multifocal (largest focus 2.4 cm, with two sub-centimeter nodules also identified microscopically, each 3 mm) - Surgical margins negative  - BRAF (VE1): Negative - TILs: brisk - Two lymph nodes, negative for malignancy (0/2)    07/17/2023 Genetic Testing   [facility]us: pertinent negatives: BRAF. KIT TERT C.-124C>T variant- promoter mutation NRAS p.G13D Missense variant (exon 2) - GOF TP53 p.R342P ARID1A p.G285fs NF1 p.R1362* PHLPP2 p.Q300* NF1 p.K191_R192delinsN*  Multiple VUS findings.   08/05/2023 Imaging   MRI brain  --Single enhancing right occipital cortical lesion is most concerning for intracranial metastatic disease.   --Indeterminate 7 mm enhancing lesion in the right posterior parietal bone. There is no definite FDG avidity in this region on the recent PET/CT, arguing against malignancy, however close attention to this area on follow-up imaging is recommended.    08/05/2023 Imaging   CT chest IMPRESSION:   -Right upper lobectomy with moderate sized loculated right pleural effusion. New 0.3 cm right lower lobe micronodule. Recommend attention on follow-up CT in 3 months.   Few subcentimeter right infra-axillary lymph nodes with adjacent fat stranding and benign morphology, new since prior. Recommendation follow-up CT.   Markedly enlarged left atrium and mild qualitative dilatation of right atrium. Extensive coronary  artery calcifications.    09/06/2023 -  Chemotherapy   Patient is on Treatment Plan : METASTATIC MELANOMA Nivolumab  (240) q14d     09/19/2023 Cancer Staging   Staging form: Melanoma of the Skin, AJCC 7th Edition - Clinical: Stage IV (TX, N0, M1c) - Signed by Tina [name], MD on 09/19/2023 Biopsy of metastatic site performed: Yes Source of metastatic specimen: Lung   11/19/2023 Imaging   MRI brain: Positive treatment response, previously seen brain lesions are no longer detectable. No new lesion.   11/21/2023 PET scan   PET: after 6C of nivo every 2 weeks. Marked response to therapy, including resolution of hepatic and osseous hypermetabolism.       REVIEW OF SYSTEMS:   Constitutional: Denies fevers, chills or abnormal weight loss Eyes: Denies blurriness of vision Ears, nose,  mouth, throat, and face: Denies mucositis or sore throat Respiratory: Denies cough, dyspnea or wheezes Cardiovascular: Denies palpitation, chest discomfort or lower extremity swelling Gastrointestinal:  Denies nausea, heartburn or change in bowel habits Skin: Denies abnormal skin rashes Lymphatics: Denies new lymphadenopathy or easy bruising Neurological:Denies numbness, tingling or new weaknesses Behavioral/Psych: Mood is stable, no new changes  All other systems were reviewed with the patient and are negative.   VITALS:   Today's Vitals   08/07/24 0930  BP: 114/68  Pulse: 82  Resp: 16  Temp: 97.8 F (36.6 C)  TempSrc: Oral  SpO2: 97%  Weight: 185 lb 8 oz (84.1 kg)  PainSc: 0-No pain   Body mass index is 25.87 kg/m.    Wt Readings from Last 3 Encounters:  08/07/24 185 lb 8 oz (84.1 kg)  07/22/24 179 lb 3.2 oz (81.3 kg)  07/16/24 180 lb 6.4 oz (81.8 kg)    Body mass index is 25.87 kg/m.  Performance status (ECOG): 1 - Symptomatic but completely ambulatory  PHYSICAL EXAM:   GENERAL:alert, no distress and comfortable SKIN: skin color, texture, turgor are normal, no rashes or significant lesions EYES: normal, Conjunctiva are pink and non-injected, sclera clear OROPHARYNX:no exudate, no erythema and lips, buccal mucosa, and tongue normal  NECK: supple, thyroid  normal size, non-tender, without nodularity LYMPH:  no palpable lymphadenopathy in the cervical, axillary or inguinal LUNGS: clear to auscultation and percussion with normal breathing effort HEART: regular rate & rhythm and no murmurs and no lower extremity edema ABDOMEN:abdomen soft, non-tender and normal bowel sounds Musculoskeletal:no cyanosis of digits and no clubbing  NEURO: alert & oriented x 3 with fluent speech, no focal motor/sensory deficits  LABORATORY DATA:  I have reviewed the data as listed    Component Value Date/Time   NA 131 (L) 08/07/2024 0854   NA 126 (A) 06/28/2023 0000   K 3.7  08/07/2024 0854   CL 97 (L) 08/07/2024 0854   CO2 23 08/07/2024 0854   GLUCOSE 192 (H) 08/07/2024 0854   BUN 15 08/07/2024 0854   BUN 11 06/28/2023 0000   CREATININE 1.15 08/07/2024 0854   CREATININE 1.49 (H) 06/06/2020 1022   CALCIUM  9.5 08/07/2024 0854   PROT 7.1 08/07/2024 0854   PROT 6.9 02/20/2021 0920   ALBUMIN 4.3 08/07/2024 0854   ALBUMIN 4.6 02/20/2021 0920   AST 22 08/07/2024 0854   ALT 13 08/07/2024 0854   ALKPHOS 70 08/07/2024 0854   BILITOT 1.7 (H) 08/07/2024 0854   GFRNONAA >60 08/07/2024 0854   GFRAA 53 05/10/2020 0000     Lab Results  Component Value Date   WBC 8.5 08/07/2024   NEUTROABS 6.9 08/07/2024  HGB 13.0 08/07/2024   HCT 37.6 (L) 08/07/2024   MCV 102.2 (H) 08/07/2024   PLT 185 08/07/2024     RADIOGRAPHIC STUDIES: NM PET Image Restage (PS) Whole Body Result Date: 08/07/2024 CLINICAL DATA:  Subsequent treatment strategy for melanoma of the scalp with bone and liver metastases. EXAM: NUCLEAR MEDICINE PET WHOLE BODY TECHNIQUE: 8.9 mCi F-18 FDG was injected intravenously. Full-ring PET imaging was performed from the head to foot after the radiotracer. CT data was obtained and used for attenuation correction and anatomic localization. Fasting blood glucose: 114 mg/dl COMPARISON:  92/82/7974. FINDINGS: Mediastinal blood pool activity: SUV max 3.0 HEAD/NECK: No hypermetabolic activity in the scalp. Tiny hypermetabolic focus identified in the left paramidline posterior neck, close to the midline at the level of the C3 spinous process with SUV max = 4.2. Activity projects in the region of the skin/superficial subcutaneous fat, but no definite underlying soft tissue nodule on CT imaging. No hypermetabolic cervical lymph nodes. No hypermetabolism identified in the region of the parietal bone lesions identified on previous MRI, potentially obscured by intense normal cortical brain uptake. Incidental CT findings: none CHEST: No hypermetabolic mediastinal or hilar nodes.  No suspicious pulmonary nodules on the CT scan. Incidental CT findings: Ascending thoracic aorta measures up to 4.0 cm diameter. Coronary artery calcification is evident. Trace pericardial effusion evident. Moderate atherosclerotic calcification is noted in the wall of the thoracic aorta. Lucency along the right heart border is stable comparing back to a study from 11/21/2023 and may be postsurgical. ABDOMEN/PELVIS: Interval recurrence of a hypermetabolic lesion in the posteromedial right liver ( SUV max = 11.0). This corresponds to the hypermetabolic lesion seen on the 08/16/2023 PET-CT that had resolved on subsequent exams. No other hypermetabolic liver lesion evident. No hypermetabolic lymphadenopathy in the abdomen or pelvis. Scattered uptake is seen along the colon, likely physiologic. Incidental CT findings: Pancreas is diffusely atrophic with stable marked dilatation of the main pancreatic duct. Diffuse pancreatic parenchymal calcifications suggest chronic pancreatitis. Stones within the main pancreatic duct at the ampulla cannot be excluded. Gallbladder surgically absent. There is moderate atherosclerotic calcification of the abdominal aorta without aneurysm. Marked circumferential bladder wall thickening noted with large posterior left bladder diverticulum. SKELETON: No focal hypermetabolic activity to suggest skeletal metastasis. Incidental CT findings: none EXTREMITIES: Tiny focus of hypermetabolism identified along the right shin with SUV max = 2.9. CT imaging shows a tiny cutaneous lesion at this location, just lateral to the tibia at about mid shin level, corresponding to image 375 of series 4 on CT images. Incidental CT findings: none IMPRESSION: 1. Interval recurrence of a hypermetabolic lesion in the posteromedial right liver. This corresponds to the hypermetabolic lesion seen on the 08/16/2023 PET-CT that had resolved on subsequent exams. 2. Tiny focus of hypermetabolism identified in the left  paramidline posterior neck, close to the midline at the level of the C3 spinous process. Activity projects in the region of the skin/superficial subcutaneous fat, but no definite underlying soft tissue nodule on CT imaging. 3. Tiny focus of hypermetabolism identified along the right shin with SUV max = 2.9. CT imaging shows a tiny cutaneous lesion at this location, just lateral to the tibia at about mid shin level. 4. No hypermetabolism identified in the region of the parietal bone lesions identified on previous MRI, potentially obscured by intense normal cortical brain uptake. 5.  Aortic Atherosclerosis (ICD10-I70.0). Electronically Signed   By: Camellia Candle M.D.   On: 08/07/2024 09:28   MR Brain W Wo  Contrast Result Date: 07/24/2024 EXAM: MRI BRAIN WITH AND WITHOUT CONTRAST 07/21/2024 11:36:51 AM TECHNIQUE: Multiplanar multisequence MRI of the head/brain was performed with and without the administration of intravenous contrast. CONTRAST: 8 mL of Vueway . COMPARISON: MR Head 05/21/2024. CLINICAL HISTORY: Brain metastases, assess treatment response, 3T SRS Protocol - short interval follow-up. FINDINGS: BRAIN AND VENTRICLES: No acute infarct. No acute intracranial hemorrhage. No mass effect or midline shift. No hydrocephalus. The sella is unremarkable. Normal flow voids. Persistent focus of linear cortical enhancement along the right angular gyrus seen on axial image 90 series 13. No other mass or abnormal enhancement. ORBITS: No acute abnormality. SINUSES: No acute abnormality. BONES AND SOFT TISSUES: Normal bone marrow signal and enhancement. Unchanged and enhancing lesion in the right parietal bone abutting the dura on axial image 144 series 13. Unchanged and enhancing lesion along the right lambdoid suture on axial image 75 series 13. No acute soft tissue abnormality. IMPRESSION: 1. Persistent linear cortical enhancement along the right angular gyrus, unchanged. 2. Unchanged enhancing lesions in the right  parietal bone abutting the dura and along the right lambdoid suture. Electronically signed by: Ryan Chess MD 07/24/2024 11:47 AM EST RP Workstation: [id number]

## 2024-08-07 ENCOUNTER — Inpatient Hospital Stay: Admitting: Nurse Practitioner

## 2024-08-07 ENCOUNTER — Other Ambulatory Visit: Payer: Self-pay

## 2024-08-07 ENCOUNTER — Other Ambulatory Visit: Payer: Self-pay | Admitting: Nurse Practitioner

## 2024-08-07 ENCOUNTER — Inpatient Hospital Stay

## 2024-08-07 ENCOUNTER — Encounter: Payer: Self-pay | Admitting: Nurse Practitioner

## 2024-08-07 VITALS — BP 114/68 | HR 82 | Temp 97.8°F | Resp 16 | Wt 185.5 lb

## 2024-08-07 VITALS — BP 137/66 | HR 81 | Temp 97.8°F | Resp 16

## 2024-08-07 DIAGNOSIS — I4891 Unspecified atrial fibrillation: Secondary | ICD-10-CM | POA: Insufficient documentation

## 2024-08-07 DIAGNOSIS — C7800 Secondary malignant neoplasm of unspecified lung: Secondary | ICD-10-CM | POA: Diagnosis not present

## 2024-08-07 DIAGNOSIS — M130 Polyarthritis, unspecified: Secondary | ICD-10-CM | POA: Diagnosis not present

## 2024-08-07 DIAGNOSIS — Z7952 Long term (current) use of systemic steroids: Secondary | ICD-10-CM | POA: Diagnosis not present

## 2024-08-07 DIAGNOSIS — C787 Secondary malignant neoplasm of liver and intrahepatic bile duct: Secondary | ICD-10-CM | POA: Insufficient documentation

## 2024-08-07 DIAGNOSIS — C434 Malignant melanoma of scalp and neck: Secondary | ICD-10-CM

## 2024-08-07 DIAGNOSIS — Z5112 Encounter for antineoplastic immunotherapy: Secondary | ICD-10-CM | POA: Diagnosis present

## 2024-08-07 DIAGNOSIS — C7931 Secondary malignant neoplasm of brain: Secondary | ICD-10-CM | POA: Insufficient documentation

## 2024-08-07 DIAGNOSIS — D649 Anemia, unspecified: Secondary | ICD-10-CM | POA: Insufficient documentation

## 2024-08-07 DIAGNOSIS — C7951 Secondary malignant neoplasm of bone: Secondary | ICD-10-CM | POA: Insufficient documentation

## 2024-08-07 DIAGNOSIS — K769 Liver disease, unspecified: Secondary | ICD-10-CM

## 2024-08-07 DIAGNOSIS — Z79899 Other long term (current) drug therapy: Secondary | ICD-10-CM | POA: Diagnosis not present

## 2024-08-07 DIAGNOSIS — Z7901 Long term (current) use of anticoagulants: Secondary | ICD-10-CM | POA: Insufficient documentation

## 2024-08-07 LAB — CMP (CANCER CENTER ONLY)
ALT: 13 U/L (ref 0–44)
AST: 22 U/L (ref 15–41)
Albumin: 4.3 g/dL (ref 3.5–5.0)
Alkaline Phosphatase: 70 U/L (ref 38–126)
Anion gap: 12 (ref 5–15)
BUN: 15 mg/dL (ref 8–23)
CO2: 23 mmol/L (ref 22–32)
Calcium: 9.5 mg/dL (ref 8.9–10.3)
Chloride: 97 mmol/L — ABNORMAL LOW (ref 98–111)
Creatinine: 1.15 mg/dL (ref 0.61–1.24)
GFR, Estimated: >60 mL/min (ref >60)
Glucose, Bld: 192 mg/dL — ABNORMAL HIGH (ref 70–99)
Potassium: 3.7 mmol/L (ref 3.5–5.1)
Sodium: 131 mmol/L — ABNORMAL LOW (ref 135–145)
Total Bilirubin: 1.7 mg/dL — ABNORMAL HIGH (ref 0.0–1.2)
Total Protein: 7.1 g/dL (ref 6.5–8.1)

## 2024-08-07 LAB — CBC WITH DIFFERENTIAL (CANCER CENTER ONLY)
Abs Immature Granulocytes: 0.03 K/uL (ref 0.00–0.07)
Basophils Absolute: 0 K/uL (ref 0.0–0.1)
Basophils Relative: 1 %
Eosinophils Absolute: 0.1 K/uL (ref 0.0–0.5)
Eosinophils Relative: 1 %
HCT: 37.6 % — ABNORMAL LOW (ref 39.0–52.0)
Hemoglobin: 13 g/dL (ref 13.0–17.0)
Immature Granulocytes: 0 %
Lymphocytes Relative: 9 %
Lymphs Abs: 0.7 K/uL (ref 0.7–4.0)
MCH: 35.3 pg — ABNORMAL HIGH (ref 26.0–34.0)
MCHC: 34.6 g/dL (ref 30.0–36.0)
MCV: 102.2 fL — ABNORMAL HIGH (ref 80.0–100.0)
Monocytes Absolute: 0.8 K/uL (ref 0.1–1.0)
Monocytes Relative: 9 %
Neutro Abs: 6.9 K/uL (ref 1.7–7.7)
Neutrophils Relative %: 80 %
Platelet Count: 185 K/uL (ref 150–400)
RBC: 3.68 MIL/uL — ABNORMAL LOW (ref 4.22–5.81)
RDW: 14.3 % (ref 11.5–15.5)
WBC Count: 8.5 K/uL (ref 4.0–10.5)
nRBC: 0 % (ref 0.0–0.2)

## 2024-08-07 LAB — LACTATE DEHYDROGENASE: LDH: 135 U/L (ref 105–235)

## 2024-08-07 LAB — TSH: TSH: 2.65 u[IU]/mL (ref 0.350–4.500)

## 2024-08-07 LAB — T4, FREE: Free T4: 0.97 ng/dL (ref 0.61–1.12)

## 2024-08-07 MED ORDER — SODIUM CHLORIDE 0.9 % IV SOLN: INTRAVENOUS | Status: DC

## 2024-08-07 MED ORDER — SODIUM CHLORIDE 0.9 % IV SOLN
240.0000 mg | Freq: Once | INTRAVENOUS | Status: AC
  Administered 2024-08-07: 240 mg via INTRAVENOUS
  Filled 2024-08-07: qty 24

## 2024-08-07 NOTE — Patient Instructions (Signed)
 [facility] - A DEPT OF [facility]  Discharge Instructions: Thank you for choosing [facility] to provide your oncology and hematology care.   If you have a lab appointment with the Cancer Center, please go directly to the Cancer Center and check in at the registration area.   Wear comfortable clothing and clothing appropriate for easy access to any Portacath or PICC line.   We strive to give you quality time with your provider. You may need to reschedule your appointment if you arrive late (15 or more minutes).  Arriving late affects you and other patients whose appointments are after yours.  Also, if you miss three or more appointments without notifying the office, you may be dismissed from the clinic at the provider's discretion.      For prescription refill requests, have your pharmacy contact our office and allow 72 hours for refills to be completed.    Today you received the following chemotherapy and/or immunotherapy agents: nivolumab (OPDIVO)       To help prevent nausea and vomiting after your treatment, we encourage you to take your nausea medication as directed.  BELOW ARE SYMPTOMS THAT SHOULD BE REPORTED IMMEDIATELY: *FEVER GREATER THAN 100.4 F (38 C) OR HIGHER *CHILLS OR SWEATING *NAUSEA AND VOMITING THAT IS NOT CONTROLLED WITH YOUR NAUSEA MEDICATION *UNUSUAL SHORTNESS OF BREATH *UNUSUAL BRUISING OR BLEEDING *URINARY PROBLEMS (pain or burning when urinating, or frequent urination) *BOWEL PROBLEMS (unusual diarrhea, constipation, pain near the anus) TENDERNESS IN MOUTH AND THROAT WITH OR WITHOUT PRESENCE OF ULCERS (sore throat, sores in mouth, or a toothache) UNUSUAL RASH, SWELLING OR PAIN  UNUSUAL VAGINAL DISCHARGE OR ITCHING   Items with * indicate a potential emergency and should be followed up as soon as possible or go to the Emergency Department if any problems should occur.  Please show the CHEMOTHERAPY ALERT CARD or  IMMUNOTHERAPY ALERT CARD at check-in to the Emergency Department and triage nurse.  Should you have questions after your visit or need to cancel or reschedule your appointment, please contact [facility] - A DEPT OF [facility]  Dept: [phone]  and follow the prompts.  Office hours are 8:00 a.m. to 4:30 p.m. Monday - Friday. Please note that voicemails left after 4:00 p.m. may not be returned until the following business day.  We are closed weekends and major holidays. You have access to a nurse at all times for urgent questions. Please call the main number to the clinic Dept: [phone] and follow the prompts.   For any non-urgent questions, you may also contact your provider using MyChart. We now offer e-Visits for anyone 3 and older to request care online for non-urgent symptoms. For details visit [email or web address].   Also download the MyChart app! Go to the app store, search "MyChart", open the app, select [place], and log in with your MyChart username and password.

## 2024-08-07 NOTE — Assessment & Plan Note (Addendum)
 Continue immunotherapy with nivolumab  Monitor clinical signs of worsening immune related side effects Return in 2 weeks with labs visit and treatment PET scan done on 08/03/2024.  Results are pending at time of visit.  Dr. Tina to review results with patient when available.

## 2024-08-10 ENCOUNTER — Other Ambulatory Visit: Payer: Self-pay

## 2024-08-10 DIAGNOSIS — M899 Disorder of bone, unspecified: Secondary | ICD-10-CM

## 2024-08-10 NOTE — Progress Notes (Signed)
 Spoke to patient about PET/CT results.  Inquired IR about local therapy and biopsy.  Report this is feasible.    Patient has no neck pain, abdominal pain.  Will obtain MRI C-spine to evaluate for questionable C3 lesion.  Patient has a skin lesion over the right shin and will be seeing his dermatology for evaluation.  Will continue immunotherapy.  He understands.

## 2024-08-14 NOTE — Progress Notes (Signed)
 Nicholas Bird                                          MRN: [id number]   08/14/2024   The [facility] Quality Team Specialist reviewed this patient medical record for the purposes of chart review for care gap closure. The following were reviewed: abstraction for care gap closure-controlling blood pressure.    [facility] Quality Team

## 2024-08-17 ENCOUNTER — Ambulatory Visit (HOSPITAL_COMMUNITY): Admission: RE | Admit: 2024-08-17 | Discharge: 2024-08-17

## 2024-08-17 DIAGNOSIS — M899 Disorder of bone, unspecified: Secondary | ICD-10-CM | POA: Insufficient documentation

## 2024-08-17 DIAGNOSIS — M47812 Spondylosis without myelopathy or radiculopathy, cervical region: Secondary | ICD-10-CM | POA: Diagnosis not present

## 2024-08-17 MED ORDER — GADOBUTROL 1 MMOL/ML IV SOLN
8.0000 mL | Freq: Once | INTRAVENOUS | Status: AC | PRN
  Administered 2024-08-17: 13:00:00 8 mL via INTRAVENOUS

## 2024-08-19 DIAGNOSIS — D485 Neoplasm of uncertain behavior of skin: Secondary | ICD-10-CM | POA: Diagnosis not present

## 2024-08-19 DIAGNOSIS — Z85820 Personal history of malignant melanoma of skin: Secondary | ICD-10-CM | POA: Diagnosis not present

## 2024-08-19 DIAGNOSIS — C44722 Squamous cell carcinoma of skin of right lower limb, including hip: Secondary | ICD-10-CM | POA: Diagnosis not present

## 2024-08-19 DIAGNOSIS — D234 Other benign neoplasm of skin of scalp and neck: Secondary | ICD-10-CM | POA: Diagnosis not present

## 2024-08-20 NOTE — Progress Notes (Unsigned)
 [facility] OFFICE PROGRESS NOTE  Patient Care Team: [name], MD as PCP - General (Family Medicine) [name], MD (Inactive) as PCP - Electrophysiology (Cardiology) [name], MD (Inactive) (Cardiology) [name], MD as Attending Physician (Urology) [name], MD as Consulting Physician (Dermatology) [name], MD as Consulting Physician (Ophthalmology) [name], MD as Consulting Physician (Gastroenterology) [name], [facility] as Pharmacist (Pharmacist) [name], RN as [facility] Care Management  83 y.o.man undergoing nivolumab  for stage IV melanoma, HTN, Afib on apixaban , GERD, gout, BPH, being follow up for metastatic melanoma on treatment.    Previous immune related side effects of diarrhea resolved. Previous polyarthritis resolved.  No recurrence.    Current Diagnosis: Metastatic melanoma with brain and lung metastases.  Suspicious for bone metastases. BRAF negative. 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 (Started 09/06/23) 08/19/23 SRS to 4 brain mets  Growing liver metastases. Will obtain biopsy and local treatment. Appointment with IR next week.  C3/neck reported normal. No obvious skin lesion. Shin lesion removed. Assessment & Plan Lesion of bone of cervical spine Negative on MRI Melanoma of scalp (HCC) Continue immunotherapy with nivolumab  Liver directed therapy this month Monitor clinical signs of worsening immune related side effects Return in 2 weeks with labs visit and treatment Malignant melanoma metastatic to brain [facility]) MRI scheduled for 2/18 ordered by Rad onc Low serum vitamin B12 Mild anemia. Ok to continue B12 500 mcg daily. Previously low-normal folate as well. Both improved Polyarthritis Patient is on apixaban  for history of A-fib.   Will continue Prednisone  to 5 mg 3 times weekly.   [name], MD  INTERVAL HISTORY: Patient returns for  follow-up. Overall feeling well. Lesion on the shin removed. No lesion noted on the neck. No new joint pain. No coughing, short of breath, diarrhea, rash.  Oncology History  Melanoma of scalp (HCC)  01/01/2015 Initial Diagnosis   Malignant melanoma (HCC)   12/2014 Surgery   April 2016 WLE and SLN 01/31/2015: Reexcision    02/25/2018 Imaging   CT CAP Subcentimeter right upper lobe subpleural and left upper lobe perifissural nodules, new from 01/24/2016, indeterminate. Recommend short interval follow-up (3 months), given histor    04/07/2019 Imaging   CT Chest: No evidence of metastatic disease within the chest. Left upper lobe perifissural nodule no longer evident. Right upper lobe subpleural nodule no longer evident.   CT AP No evidence of metastatic disease.     04/2020 Pathology Results   Summer 2021: New scalp lesion, in transit  path: -Malignant melanoma, involving dermal tissue    04/12/2020 Imaging   CT chest: NED CT AP: NED    05/04/2020 Imaging   CT Neck: No mass or lymphadenopathy within the neck.    05/25/2020 - 07/25/2020 Chemotherapy   5 cycles of TVEC   01/24/2021 PET scan   PET whole body - No abnormal foci of increased radiotracer uptake to suggest metastatic disease.  -Small focus of increased uptake within the proximal sigmoid colon without adjacent stranding or definite wall thickening, indeterminant. Recommend further evaluation with colonoscopy if clinically    08/24/2021 PET scan   PET whole body - No abnormal foci of increased radiotracer uptake to suggest metastatic disease.   - Small focus of increased uptake within the proximal sigmoid colon, similar to 01/24/2021. Recommend clinical correlation and/or further evaluation with colonoscopy if clinically indicated    01/31/2022 Surgery   (Outside case, [phone], 1 slide,  01/23/2022) Skin, left inferior lateral anterior scalp, shave - Metastatic malignant melanoma Size: 2 mm Pattern: Pigmented epithelioid  cells forming small plaque in superficial dermis and showing epidermotropism Tumor infiltrating lymphocytes: Brisk Nuclear grade: 3/3 Margins: Focally present adjacent to edge of tissue sections    02/21/2022 - 05/09/2022 Chemotherapy   had an intransit recurrence of melanoma and was retreated with TVEC.  Biopsy in October 2023 showed NED.    05/09/2022 PET scan   - Mildly avid 3 mm right upper lobe subcentimeter pulmonary nodule, possibly increased from 2 mm on prior; recommend attention on follow-up.   -No definite evidence of metabolically active disease.    05/28/2022 Pathology Results   Diagnosis   Skin, Left temple, excision - Dermal fibrosis with lymphocytic and granulomatous inflammation consistent with site of recent therapy - No residual melanoma identified - Margins free of tumor      02/15/2023 PET scan   - Focal area of hypermetabolic activity is seen in the ascending colon, indeterminate. Recommend correlation with colonoscopy.   -Unchanged size of mildly hypermetabolic left paratracheal lymph node, indeterminate. Recommend attention on follow-up imaging.   -Pancreatic calcifications likely reflect sequela of chronic pancreatitis.   -Bladder diverticuli and enlarged prostate.    05/13/2023 PET scan   1.  Evidence of progressive disease as demonstrated by interval enlargement of right upper lobe pulmonary nodule most concerning for metastasis by melanoma. Additional single right pulmonary nodule and 2 sites of focal osseous avidity which are suspicious but indeterminate for additional sites of involvement by metastasis involving the T8 and L1 vertebral bodies.  2.  Focal uptake within the sigmoid colon which has increased in conspicuity over time. Recommend correlation with colonoscopy if not recently completed with differential diagnosis including adenoma, carcinoma, and inflammatory lesion (given associated coarse calcifications/high density material). No evidence of  complication (i.e.  fluid collection, or free air) by low-dose unenhanced CT.  3.  Persistent FDG abnormality of the left anterior shin associated with a dermal nodule. Recommend direct inspection as additional site of cutaneous neoplasm is not excluded.    06/14/2023 Pathology Results   A: Lung, right upper lobe, lobectomy  - Metastatic melanoma, multifocal (largest focus 2.4 cm, with two sub-centimeter nodules also identified microscopically, each 3 mm) - Surgical margins negative  - BRAF (VE1): Negative - TILs: brisk - Two lymph nodes, negative for malignancy (0/2)   07/17/2023 Genetic Testing   [place]: pertinent negatives: BRAF. KIT TERT C.-124C>T variant- promoter mutation NRAS p.G13D Missense variant (exon 2) - GOF TP53 p.R342P ARID1A p.G285fs NF1 p.R1362* PHLPP2 p.Q300* NF1 p.K191_R192delinsN*  Multiple VUS findings.   08/05/2023 Imaging   MRI brain  --Single enhancing right occipital cortical lesion is most concerning for intracranial metastatic disease.   --Indeterminate 7 mm enhancing lesion in the right posterior parietal bone. There is no definite FDG avidity in this region on the recent PET/CT, arguing against malignancy, however close attention to this area on follow-up imaging is recommended.    08/05/2023 Imaging   CT chest IMPRESSION:   -Right upper lobectomy with moderate sized loculated right pleural effusion. New 0.3 cm right lower lobe micronodule. Recommend attention on follow-up CT in 3 months.   Few subcentimeter right infra-axillary lymph nodes with adjacent fat stranding and benign morphology, new since prior. Recommendation follow-up CT.   Markedly enlarged left atrium and mild qualitative dilatation of right atrium. Extensive coronary artery calcifications.    09/06/2023 -  Chemotherapy   Patient is on Treatment Plan :  METASTATIC MELANOMA Nivolumab  (240) q14d     09/19/2023 Cancer Staging   Staging form: Melanoma of the Skin, AJCC 7th Edition -  Clinical: Stage IV (TX, N0, M1c) - Signed by Tina [name], MD on 09/19/2023 Biopsy of metastatic site performed: Yes Source of metastatic specimen: Lung   11/19/2023 Imaging   MRI brain: Positive treatment response, previously seen brain lesions are no longer detectable. No new lesion.   11/21/2023 PET scan   PET: after 6C of nivo every 2 weeks. Marked response to therapy, including resolution of hepatic and osseous hypermetabolism.      PHYSICAL EXAMINATION: ECOG PERFORMANCE STATUS: 1  Vitals:   08/21/24 1243  BP: 138/67  Pulse: 65  Resp: 20  Temp: 97.9 F (36.6 C)  SpO2: 99%   Filed Weights   08/21/24 1243  Weight: 182 lb 4.8 oz (82.7 kg)    GENERAL: alert, no distress and comfortable SKIN: skin color normal and no jaundice or petechiae on exposed skin EYES: normal, sclera clear OROPHARYNX: no exudate  NECK: No palpable mass LUNGS: clear to auscultation and no wheeze or rales with normal breathing effort HEART: regular rate & rhythm  Musculoskeletal: no edema   Relevant data reviewed during this visit included labs.

## 2024-08-20 NOTE — Assessment & Plan Note (Signed)
 Continue immunotherapy with nivolumab  Liver directed therapy this month Monitor clinical signs of worsening immune related side effects Return in 2 weeks with labs visit and treatment PET scan done on 08/03/2024.  Results are pending at time of visit.  Dr. Tina to review results with patient when available.

## 2024-08-21 ENCOUNTER — Inpatient Hospital Stay

## 2024-08-21 VITALS — BP 138/67 | HR 65 | Temp 97.9°F | Resp 20 | Wt 182.3 lb

## 2024-08-21 VITALS — BP 127/78 | HR 66 | Resp 18

## 2024-08-21 DIAGNOSIS — M130 Polyarthritis, unspecified: Secondary | ICD-10-CM | POA: Diagnosis not present

## 2024-08-21 DIAGNOSIS — C7931 Secondary malignant neoplasm of brain: Secondary | ICD-10-CM | POA: Diagnosis not present

## 2024-08-21 DIAGNOSIS — E538 Deficiency of other specified B group vitamins: Secondary | ICD-10-CM | POA: Diagnosis not present

## 2024-08-21 DIAGNOSIS — M899 Disorder of bone, unspecified: Secondary | ICD-10-CM

## 2024-08-21 DIAGNOSIS — C434 Malignant melanoma of scalp and neck: Secondary | ICD-10-CM | POA: Diagnosis not present

## 2024-08-21 DIAGNOSIS — Z5112 Encounter for antineoplastic immunotherapy: Secondary | ICD-10-CM | POA: Diagnosis not present

## 2024-08-21 LAB — CMP (CANCER CENTER ONLY)
ALT: 13 U/L (ref 0–44)
AST: 21 U/L (ref 15–41)
Albumin: 4.5 g/dL (ref 3.5–5.0)
Alkaline Phosphatase: 71 U/L (ref 38–126)
Anion gap: 12 (ref 5–15)
BUN: 18 mg/dL (ref 8–23)
CO2: 27 mmol/L (ref 22–32)
Calcium: 9.6 mg/dL (ref 8.9–10.3)
Chloride: 96 mmol/L — ABNORMAL LOW (ref 98–111)
Creatinine: 1.29 mg/dL — ABNORMAL HIGH (ref 0.61–1.24)
GFR, Estimated: 55 mL/min — ABNORMAL LOW
Glucose, Bld: 200 mg/dL — ABNORMAL HIGH (ref 70–99)
Potassium: 4.1 mmol/L (ref 3.5–5.1)
Sodium: 135 mmol/L (ref 135–145)
Total Bilirubin: 1 mg/dL (ref 0.0–1.2)
Total Protein: 7.2 g/dL (ref 6.5–8.1)

## 2024-08-21 LAB — CBC WITH DIFFERENTIAL (CANCER CENTER ONLY)
Abs Immature Granulocytes: 0.03 K/uL (ref 0.00–0.07)
Basophils Absolute: 0.1 K/uL (ref 0.0–0.1)
Basophils Relative: 1 %
Eosinophils Absolute: 0.2 K/uL (ref 0.0–0.5)
Eosinophils Relative: 3 %
HCT: 38.7 % — ABNORMAL LOW (ref 39.0–52.0)
Hemoglobin: 13.6 g/dL (ref 13.0–17.0)
Immature Granulocytes: 0 %
Lymphocytes Relative: 10 %
Lymphs Abs: 0.8 K/uL (ref 0.7–4.0)
MCH: 35.5 pg — ABNORMAL HIGH (ref 26.0–34.0)
MCHC: 35.1 g/dL (ref 30.0–36.0)
MCV: 101 fL — ABNORMAL HIGH (ref 80.0–100.0)
Monocytes Absolute: 0.6 K/uL (ref 0.1–1.0)
Monocytes Relative: 8 %
Neutro Abs: 5.7 K/uL (ref 1.7–7.7)
Neutrophils Relative %: 78 %
Platelet Count: 197 K/uL (ref 150–400)
RBC: 3.83 MIL/uL — ABNORMAL LOW (ref 4.22–5.81)
RDW: 14.2 % (ref 11.5–15.5)
WBC Count: 7.3 K/uL (ref 4.0–10.5)
nRBC: 0 % (ref 0.0–0.2)

## 2024-08-21 LAB — LACTATE DEHYDROGENASE: LDH: 152 U/L (ref 105–235)

## 2024-08-21 LAB — T4, FREE: Free T4: 1.4 ng/dL (ref 0.80–2.00)

## 2024-08-21 LAB — TSH: TSH: 2.27 u[IU]/mL (ref 0.350–4.500)

## 2024-08-21 MED ORDER — SODIUM CHLORIDE 0.9 % IV SOLN
240.0000 mg | Freq: Once | INTRAVENOUS | Status: AC
  Administered 2024-08-21: 240 mg via INTRAVENOUS
  Filled 2024-08-21: qty 24

## 2024-08-21 MED ORDER — SODIUM CHLORIDE 0.9 % IV SOLN: INTRAVENOUS | Status: DC

## 2024-08-21 NOTE — Assessment & Plan Note (Addendum)
 Patient is on apixaban  for history of A-fib.   Will continue Prednisone  to 5 mg 3 times weekly.

## 2024-08-21 NOTE — Patient Instructions (Signed)
 [facility] - A DEPT OF [facility]  Discharge Instructions: Thank you for choosing [facility] to provide your oncology and hematology care.   If you have a lab appointment with the Cancer Center, please go directly to the Cancer Center and check in at the registration area.   Wear comfortable clothing and clothing appropriate for easy access to any Portacath or PICC line.   We strive to give you quality time with your provider. You may need to reschedule your appointment if you arrive late (15 or more minutes).  Arriving late affects you and other patients whose appointments are after yours.  Also, if you miss three or more appointments without notifying the office, you may be dismissed from the clinic at the provider's discretion.      For prescription refill requests, have your pharmacy contact our office and allow 72 hours for refills to be completed.    Today you received the following chemotherapy and/or immunotherapy agents: Opdivo    To help prevent nausea and vomiting after your treatment, we encourage you to take your nausea medication as directed.  BELOW ARE SYMPTOMS THAT SHOULD BE REPORTED IMMEDIATELY: *FEVER GREATER THAN 100.4 F (38 C) OR HIGHER *CHILLS OR SWEATING *NAUSEA AND VOMITING THAT IS NOT CONTROLLED WITH YOUR NAUSEA MEDICATION *UNUSUAL SHORTNESS OF BREATH *UNUSUAL BRUISING OR BLEEDING *URINARY PROBLEMS (pain or burning when urinating, or frequent urination) *BOWEL PROBLEMS (unusual diarrhea, constipation, pain near the anus) TENDERNESS IN MOUTH AND THROAT WITH OR WITHOUT PRESENCE OF ULCERS (sore throat, sores in mouth, or a toothache) UNUSUAL RASH, SWELLING OR PAIN  UNUSUAL VAGINAL DISCHARGE OR ITCHING   Items with * indicate a potential emergency and should be followed up as soon as possible or go to the Emergency Department if any problems should occur.  Please show the CHEMOTHERAPY ALERT CARD or IMMUNOTHERAPY ALERT  CARD at check-in to the Emergency Department and triage nurse.  Should you have questions after your visit or need to cancel or reschedule your appointment, please contact [facility] - A DEPT OF [facility]  Dept: [phone]  and follow the prompts.  Office hours are 8:00 a.m. to 4:30 p.m. Monday - Friday. Please note that voicemails left after 4:00 p.m. may not be returned until the following business day.  We are closed weekends and major holidays. You have access to a nurse at all times for urgent questions. Please call the main number to the clinic Dept: [phone] and follow the prompts.   For any non-urgent questions, you may also contact your provider using MyChart. We now offer e-Visits for anyone 24 and older to request care online for non-urgent symptoms. For details visit [email or web address].   Also download the MyChart app! Go to the app store, search MyChart, open the app, select [facility], and log in with your MyChart username and password.

## 2024-08-21 NOTE — Assessment & Plan Note (Addendum)
 MRI scheduled for 2/18 ordered by Rad onc

## 2024-08-21 NOTE — Assessment & Plan Note (Addendum)
 Mild anemia. Ok to continue B12 500 mcg daily. Previously low-normal folate as well. Both improved

## 2024-08-23 ENCOUNTER — Other Ambulatory Visit: Payer: Self-pay | Admitting: Family Medicine

## 2024-08-24 ENCOUNTER — Other Ambulatory Visit

## 2024-08-24 DIAGNOSIS — K769 Liver disease, unspecified: Secondary | ICD-10-CM

## 2024-08-24 HISTORY — PX: IR RADIOLOGIST EVAL & MGMT: IMG5224

## 2024-08-24 NOTE — Progress Notes (Signed)
 "   Reason for visit: HM liver mass on PET, suspicious for liver metastasis   Care Team(s): Primary Care: [name], MD Medical Oncology; Tina [name], MD   Cadiology: [facility] Surgical Oncology; Alm Como, MD [facility]) Thoracic Surgery; Haithcock, Morene BRAVO, MD [facility])  History of Present Illness:  [name] comorbid w PMHx significant for Afib on [facility], metastatic L temple melanoma, initially s/p local immunoRX (T-VEC) then WLE (05/2022) and followed by VATS RUL for pulmonary metastasis (06/2023) - all perfomed at [facility]. Pt is followed closely locally by medical oncology and is on systemic immunoRx but was suspected to have hepatic metastasis, initially noted on surveillance imaging w PET (08/2023) and identified as a solitary lesion w waxing and waning appearance. It was recently observed on most recent PET CT (08/03/24) but as of yet characterized on definitive imaging w MR liver.   He is referred to VIR to confirm metastasis and discuss locoregional therapies. He is a reliable historian and has a good understanding of his disease. He is in good spirits with normal strength, mobility, and appetite. He denies fatigue.   Review of Systems: A 12-point ROS discussed, and pertinent positives are indicated in the HPI above.  All other systems are negative.   Past Medical History:  Diagnosis Date   Anticoagulant long-term use    Atrial fibrillation, permanent (HCC)    CARIOLOGIST-  DR FERNANDE   Bladder diverticulum    BPH (benign prostatic hypertrophy)    ED (erectile dysfunction) of organic origin    Elevated LFTs    CHRONIC   Gall stones    Gout    per pt 12-09-2013  gout is stable   Hematuria    History of pancreatitis    10/2012--  resolved    History of TIA (transient ischemic attack)    04/2009--  no residual   Hyperlipemia    Hypertension    Liver disease    Melanoma (HCC)    Personal history of colonic polyps 07/12/2008    TUBULAR ADENOMA   Rosacea     Past Surgical History:  Procedure Laterality Date   CARDIAC CATHETERIZATION  09-19-1998   false positive stress test---  normal cath and preserved lvf   CATARACT EXTRACTION W/ INTRAOCULAR LENS  IMPLANT, BILATERAL Bilateral 2008   CYSTOSCOPY WITH BIOPSY N/A 12/14/2013   Procedure: CYSTOSCOPY WITH BIOPSY;  Surgeon: Garnette Shack, MD;  Location: [facility];  Service: Urology;  Laterality: N/A;   INGUINAL HERNIA REPAIR Left 01-14-2001   IR RADIOLOGIST EVAL & MGMT  08/24/2024   LAPAROSCOPIC CHOLECYSTECTOMY  10-21-2008   AND LIVER BX'S   LAPAROSCOPIC INGUINAL HERNIA REPAIR Bilateral 05-22-2009    Allergies: Pertussis vaccines  Medications: Prior to Admission medications  Medication Sig Start Date End Date Taking? Authorizing Provider  allopurinol  (ZYLOPRIM ) 300 MG tablet TAKE 1 TABLET BY MOUTH EVERY DAY 03/23/24   [name], MD  amLODipine  (NORVASC ) 2.5 MG tablet Take 1 tablet (2.5 mg total) by mouth daily. 07/22/24   [name], MD  apixaban  (ELIQUIS ) 5 MG TABS tablet Take 1 tablet (5 mg total) by mouth 2 (two) times daily. 06/17/24   Almetta Donnice LABOR, MD  azelastine  (ASTELIN ) 0.1 % nasal spray Place 2 sprays into both nostrils 2 (two) times daily. 07/22/24   [name], MD  ferrous sulfate 325 (65 FE) MG tablet Take 325 mg by mouth daily.  [provider]  finasteride (PROSCAR) 5 MG tablet Take 5 mg by mouth. 10/21/14   [provider]  furosemide  (LASIX ) 40 MG tablet Take 1 tablet (40 mg total) by mouth every other day as needed. 07/22/24   [name], MD  metoprolol  succinate (TOPROL -XL) 50 MG 24 hr tablet TAKE 1 TABLET BY MOUTH EVERY DAY 06/15/24   Aniceto Daphne CROME, NP  predniSONE  (DELTASONE ) 5 MG tablet Take 1 tablet (5 mg total) by mouth 3 (three) times a week. 06/19/24   Tina [name], MD  tamsulosin  (FLOMAX ) 0.4 MG CAPS capsule Take 1 capsule (0.4 mg total) by mouth daily. 07/04/23    [name], MD  valsartan  (DIOVAN ) 80 MG tablet TAKE 1 TABLET BY MOUTH EVERY DAY 06/03/24   Lesia Ozell Barter, PA-C     Family History  Problem Relation Age of Onset   Lung cancer Mother    Skin cancer Sister    Colon cancer Neg Hx    Esophageal cancer Neg Hx    Pancreatic cancer Neg Hx    Stomach cancer Neg Hx    Rectal cancer Neg Hx     Social History   Socioeconomic History   Marital status: Married    Spouse name: Not on file   Number of children: 2   Years of education: Not on file   Highest education level: Bachelor's degree (e.g., BA, AB, BS)  Occupational History   Occupation: retired    Associate Professor: RETIRED  Tobacco Use   Smoking status: Never   Smokeless tobacco: Never  Vaping Use   Vaping status: Never Used  Substance and Sexual Activity   Alcohol use: Yes    Alcohol/week: 14.0 standard drinks of alcohol    Types: 14 Shots of liquor per week    Comment: 3 oz. per day   Drug use: No   Sexual activity: Not Currently    Partners: Female  Other Topics Concern   Not on file  Social History Narrative   Not on file   Social Drivers of Health   Tobacco Use: Low Risk (08/07/2024)   Patient History    Smoking Tobacco Use: Never    Smokeless Tobacco Use: Never    Passive Exposure: Not on file  Financial Resource Strain: Low Risk (07/21/2024)   Overall Financial Resource Strain (CARDIA)    Difficulty of Paying Living Expenses: Not hard at all  Food Insecurity: No Food Insecurity (07/21/2024)   Epic    Worried About Programme Researcher, Broadcasting/film/video in the Last Year: Never true    Ran Out of Food in the Last Year: Never true  Transportation Needs: No Transportation Needs (07/21/2024)   Epic    Lack of Transportation (Medical): No    Lack of Transportation (Non-Medical): No  Physical Activity: Sufficiently Active (07/21/2024)   Exercise Vital Sign    Days of Exercise per Week: 5 days    Minutes of Exercise per Session: 30 min  Stress: Stress Concern Present  (07/21/2024)   [employer] of Occupational Health - Occupational Stress Questionnaire    Feeling of Stress: To some extent  Social Connections: Unknown (07/21/2024)   Social Connection and Isolation Panel    Frequency of Communication with Friends and Family: More than three times a week    Frequency of Social Gatherings with Friends and Family: Three times a week    Attends Religious Services: More than 4 times per year    Active Member of Clubs or  Organizations: Yes    Attends Engineer, Structural: More than 4 times per year    Marital Status: Not on file  Depression (PHQ2-9): Low Risk (08/21/2024)   Depression (PHQ2-9)    PHQ-2 Score: 0  Alcohol Screen: Low Risk (07/21/2024)   Alcohol Screen    Last Alcohol Screening Score (AUDIT): 7  Housing: Low Risk (07/21/2024)   Epic    Unable to Pay for Housing in the Last Year: No    Number of Times Moved in the Last Year: 0    Homeless in the Last Year: No  Utilities: Not At Risk (05/27/2024)   Epic    Threatened with loss of utilities: No  Health Literacy: Adequate Health Literacy (12/09/2023)   B1300 Health Literacy    Frequency of need for help with medical instructions: Never    ECOG Status: 1 - Symptomatic but completely ambulatory  Review of Systems As above  Vital Signs: BP (!) 145/81 (BP Location: Left Arm, Patient Position: Sitting, Cuff Size: Normal)   Pulse 72 Comment: Pt states he has A-fib  Temp 97.9 F (36.6 C) (Oral)   Resp 18   SpO2 98%   Physical Exam  General: WN, NAD  CV: RRR on monitor Pulm: normal work of breathing on RA Abd: S, ND, NT MSK: Grossly normal Psych: Appropriate affect.  Imaging:    Independently reviewed demonstrating HM liver mass at segment VII, measures ~1.6 cm  PET CT, 08/03/24 IMPRESSION:  Interval recurrence of a hypermetabolic lesion in the posteromedial right liver.  This corresponds to the hypermetabolic lesion seen on the 08/16/2023 PET-CT that had resolved  on subsequent exams.   IR Radiologist Eval & Mgmt Result Date: 08/24/2024 EXAM: NEW PATIENT OFFICE VISIT CHIEF COMPLAINT: See below HISTORY OF PRESENT ILLNESS: See below REVIEW OF SYSTEMS: See below PHYSICAL EXAMINATION: See below ASSESSMENT AND PLAN: Please refer to completed note in the electronic medical record on [facility] Thom Hall, MD [facility] Radiology Electronically Signed   By: Thom Hall M.D.   On: 08/24/2024 12:45   MR CERVICAL SPINE W WO CONTRAST Result Date: 08/20/2024 CLINICAL DATA:  C3 lesion noted on CT, melanoma EXAM: MRI CERVICAL SPINE WITHOUT AND WITH CONTRAST TECHNIQUE: Multiplanar and multiecho pulse sequences of the cervical spine, to include the craniocervical junction and cervicothoracic junction, were obtained without and with intravenous contrast. CONTRAST:  8mL GADAVIST  GADOBUTROL  1 MMOL/ML IV SOLN COMPARISON:  None Available. FINDINGS: The craniocervical junction is normal. There is no significant bone marrow signal abnormality. The cervical spinal cord is normal. C2-C3: There is mild degenerative disc disease. There is ankylosis of the right facet joint. No spinal stenosis or foraminal stenosis C3-C4: There is severe degenerative disc disease. There are bilateral foraminal spurs and bilateral facet arthropathy. There is a mild disc bulge. Mild spinal stenosis. There is severe bilateral neural foraminal stenosis C4-C5: Severe degenerative disc disease with mild disc bulge. Bilateral foraminal spurs, larger on the right. Moderate left facet arthropathy. There is severe right and mild left neural foraminal stenosis C5-C6: There is severe degenerative disc disease. No significant facet disease. No spinal stenosis or foraminal stenosis C6-C7: There is severe degenerative disc disease. No significant facet disease. No spinal stenosis or foraminal stenosis C7-T1: Mild disc bulge. There is moderate right facet arthropathy.  No spinal stenosis or foraminal stenosis IMPRESSION: Cervical spondylosis. No bone lesion identified. The abnormality noted on the PET scan indicates a possible skin lesion. No MRI  correlate is identified. Correlate with physical exam. Electronically Signed   By: Nancyann Burns M.D.   On: 08/20/2024 08:37    Labs:  CBC: Recent Labs    07/02/24 1055 07/16/24 1054 08/07/24 0854 08/21/24 1228  WBC 6.7 7.7 8.5 7.3  HGB 14.0 13.4 13.0 13.6  HCT 39.5 38.6* 37.6* 38.7*  PLT 243 243 185 197    COAGS: No results for input(s): INR, APTT in the last 8760 hours.  BMP: Recent Labs    07/02/24 1055 07/16/24 1054 08/07/24 0854 08/21/24 1228  NA 133* 132* 131* 135  K 3.9 4.2 3.7 4.1  CL 97* 98 97* 96*  CO2 28 25 23 27   GLUCOSE 130* 121* 192* 200*  BUN 20 22 15 18   CALCIUM  9.9 9.7 9.5 9.6  CREATININE 1.11 1.24 1.15 1.29*  GFRNONAA >60 58* >60 55*    LIVER FUNCTION TESTS: Recent Labs    07/02/24 1055 07/16/24 1054 08/07/24 0854 08/21/24 1228  BILITOT 0.9 0.8 1.7* 1.0  AST 24 37 22 21  ALT 20 34 13 13  ALKPHOS 79 67 70 71  PROT 7.6 7.2 7.1 7.2  ALBUMIN 4.5 4.4 4.3 4.5    Computed MELD 3.0 unavailable. One or more values for this score either were not found within the given timeframe or did not fit some other criterion. Computed MELD-Na unavailable. One or more values for this score either were not found within the given timeframe or did not fit some other criterion.   TUMOR MARKERS: Recent Labs    12/12/23 1151  CEA 3.20    Cancer Staging  Melanoma of scalp (HCC) Staging form: Melanoma of the Skin, AJCC 7th Edition - Clinical: Stage IV (TX, N0, M1c) - Signed by Tina [name], MD on 09/19/2023 Biopsy of metastatic site performed: Yes Source of metastatic specimen: Lung   Assessment and Plan:  83 y/o M comorbid w PMHx of Afib on AC, metastatic L temple melanoma, initially s/p local immunoRX (T-VEC), WLE (05/2022) and VATS RUL for pulmonary metastasis  (06/2023)  ~2.0 cm liver lesion, HM on PET and suspicious for isolated hepatic metastasis. No definitive imaging. Additional brain metastases. ECOG 1. No underlying liver disease. Baseline sCr ~1.2, GFR <60 (~55)  Given his disease presentation, no definitive imaging or biopsy and underlying comorbidity, I recommended a staged approach of diagnosis and treatment beginning with advanced imaging, biopsy and fiducial marker placement to assess for feasibility of percutaneous ablation as an option. Should that be challenging in approach, or he deemed high risk on pre operative clearance, then a trans-arterial approach for treatment with radio-embolization would be recommended.   The procedure(s) have been fully reviewed with the patient/patients authorized representative. The risks, benefits and alternatives have been explained, and the patient/patients authorized representative has consented to the procedure(s).  *PET CT (08/03/24) reviewed. Request definitive imaging w MR Abdomen W/WO, liver mass protocol, ASAP.  *request cardiac  clearance, pre-anesthesia evaluation and authorization, for potential MICROWAVE ABLATION of the segment 7 liver mass  *should he obtain clearance and it be technically feasible, then ablation to be performed at [facility] with CT-guidance and under GA *AC hold prior to this procedure (Eliquis , at least 4 doses) *anticipate overnight admission post procedure   *in the meantime, to adequately localize the small tumor prior to ablation, will plan for LIVER MASS BIOPSY, and FIDUCIAL MARKER PLACEMENT *to be performed by me at [facility] in [place] with US -guidance, under moderate sedation *OK to proceed to schedule on mutual  availability, but not before MR Abdomen date. *AC hold prior to this procedure (Eliquis , at least 4 doses) *Will obtain MELD labs (CBC, CMP, INR) on procedure day arrival.   *should he be deemed high risk on pre op clearance or challenging percutaneous ablation approach,  then OK to begin authorization process for RIGHT Y90 mapping and treatment.  Thank you for this interesting consult.  I greatly enjoyed meeting Nicholas Bird and look forward to participating in their care.  A copy of this report was sent to the requesting provider on this date.  Electronically Signed:  Thom Hall, MD [facility] Interventional Radiology Specialists [facility] Radiology   Pager. [phone] Clinic. [phone]  I spent a total of 60 Minutes in face to face in clinical consultation, greater than 50% of which was counseling/coordinating care for Mr Nicholas Bird's metastatic cancer to liver.  "

## 2024-09-02 NOTE — Assessment & Plan Note (Addendum)
 Patient is on apixaban  for history of A-fib.   Will continue Prednisone  to 5 mg 3 times weekly. Vit D/Ca daily

## 2024-09-02 NOTE — Assessment & Plan Note (Addendum)
 Continue immunotherapy with nivolumab  MRI of Liver pending Monitor clinical signs of worsening immune related side effects Return in 2 weeks with labs visit and treatment

## 2024-09-02 NOTE — Progress Notes (Unsigned)
 [facility] OFFICE PROGRESS NOTE  Patient Care Team: [name], MD as PCP - General (Family Medicine) [name], MD (Inactive) as PCP - Electrophysiology (Cardiology) [name], MD (Inactive) (Cardiology) [name], MD as Attending Physician (Urology) [name], MD as Consulting Physician (Dermatology) [name], MD as Consulting Physician (Ophthalmology) [name], MD as Consulting Physician (Gastroenterology) [name], [facility] as Pharmacist (Pharmacist) [name], RN as [facility] Care Management  83 y.o.man undergoing nivolumab  for stage IV melanoma, HTN, Afib on apixaban , GERD, gout, BPH, being follow up for metastatic melanoma on treatment.    Previous immune related side effects of diarrhea resolved. Previous polyarthritis resolved.  No recurrence.    Current Diagnosis: Metastatic melanoma with brain and lung metastases.  Suspicious for bone metastases. BRAF negative. 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 (Started 09/06/23) 08/19/23 SRS to 4 brain mets   Seeing IR & plan for MRI.  Continue immunotherapy in the meantime.  Discussed restaging after 3 months of local treatment.  If recurrent, consider clinical trial. Assessment & Plan Melanoma of scalp (HCC) Continue immunotherapy with nivolumab  MRI of Liver pending Monitor clinical signs of worsening immune related side effects Return in 2 weeks with labs visit and treatment Polyarthritis Patient is on apixaban  for history of A-fib.   Will continue Prednisone  to 5 mg 3 times weekly. Vit D/Ca daily  Orders Placed This Encounter  Procedures   Lactate dehydrogenase    Standing Status:   Future    Expected Date:   10/16/2024    Expiration Date:   10/16/2025   CBC with Differential (Cancer Center Only)    Standing Status:   Future    Expected Date:   10/16/2024    Expiration Date:   10/16/2025   CMP (Cancer Center only)     Standing Status:   Future    Expected Date:   10/16/2024    Expiration Date:   10/16/2025   T4, free    Standing Status:   Future    Expected Date:   10/16/2024    Expiration Date:   10/16/2025   TSH    Standing Status:   Future    Expected Date:   10/16/2024    Expiration Date:   10/16/2025     Nicholas BROCKS [name], MD  INTERVAL HISTORY: Patient returns for follow-up.  Overall feeling well.  He is in with his wife.  No coughing, shortness of breath, diarrhea, flaring of joint pain.  No new headaches.  He is on prednisone  as prescribed 3 times a week.  No other new concerns.  Oncology History  Melanoma of scalp (HCC)  01/01/2015 Initial Diagnosis   Malignant melanoma (HCC)   12/2014 Surgery   April 2016 WLE and SLN 01/31/2015: Reexcision    02/25/2018 Imaging   CT CAP Subcentimeter right upper lobe subpleural and left upper lobe perifissural nodules, new from 01/24/2016, indeterminate. Recommend short interval follow-up (3 months), given histor    04/07/2019 Imaging   CT Chest: No evidence of metastatic disease within the chest. Left upper lobe perifissural nodule no longer evident. Right upper lobe subpleural nodule no longer evident.   CT AP No evidence of metastatic disease.     04/2020 Pathology Results   Summer 2021: New scalp lesion, in transit  path: -Malignant melanoma, involving dermal tissue    04/12/2020 Imaging   CT chest: NED CT AP: NED    05/04/2020 Imaging  CT Neck: No mass or lymphadenopathy within the neck.    05/25/2020 - 07/25/2020 Chemotherapy   5 cycles of TVEC   01/24/2021 PET scan   PET whole body - No abnormal foci of increased radiotracer uptake to suggest metastatic disease.  -Small focus of increased uptake within the proximal sigmoid colon without adjacent stranding or definite wall thickening, indeterminant. Recommend further evaluation with colonoscopy if clinically    08/24/2021 PET scan   PET whole body - No abnormal foci of increased  radiotracer uptake to suggest metastatic disease.   - Small focus of increased uptake within the proximal sigmoid colon, similar to 01/24/2021. Recommend clinical correlation and/or further evaluation with colonoscopy if clinically indicated    01/31/2022 Surgery   (Outside case, [phone], 1 slide, 01/23/2022) Skin, left inferior lateral anterior scalp, shave - Metastatic malignant melanoma Size: 2 mm Pattern: Pigmented epithelioid cells forming small plaque in superficial dermis and showing epidermotropism Tumor infiltrating lymphocytes: Brisk Nuclear grade: 3/3 Margins: Focally present adjacent to edge of tissue sections    02/21/2022 - 05/09/2022 Chemotherapy   had an intransit recurrence of melanoma and was retreated with TVEC.  Biopsy in October 2023 showed NED.    05/09/2022 PET scan   - Mildly avid 3 mm right upper lobe subcentimeter pulmonary nodule, possibly increased from 2 mm on prior; recommend attention on follow-up.   -No definite evidence of metabolically active disease.    05/28/2022 Pathology Results   Diagnosis   Skin, Left temple, excision - Dermal fibrosis with lymphocytic and granulomatous inflammation consistent with site of recent therapy - No residual melanoma identified - Margins free of tumor      02/15/2023 PET scan   - Focal area of hypermetabolic activity is seen in the ascending colon, indeterminate. Recommend correlation with colonoscopy.   -Unchanged size of mildly hypermetabolic left paratracheal lymph node, indeterminate. Recommend attention on follow-up imaging.   -Pancreatic calcifications likely reflect sequela of chronic pancreatitis.   -Bladder diverticuli and enlarged prostate.    05/13/2023 PET scan   1.  Evidence of progressive disease as demonstrated by interval enlargement of right upper lobe pulmonary nodule most concerning for metastasis by melanoma. Additional single right pulmonary nodule and 2 sites of focal osseous avidity which are  suspicious but indeterminate for additional sites of involvement by metastasis involving the T8 and L1 vertebral bodies.  2.  Focal uptake within the sigmoid colon which has increased in conspicuity over time. Recommend correlation with colonoscopy if not recently completed with differential diagnosis including adenoma, carcinoma, and inflammatory lesion (given associated coarse calcifications/high density material). No evidence of complication (i.e.  fluid collection, or free air) by low-dose unenhanced CT.  3.  Persistent FDG abnormality of the left anterior shin associated with a dermal nodule. Recommend direct inspection as additional site of cutaneous neoplasm is not excluded.    06/14/2023 Pathology Results   A: Lung, right upper lobe, lobectomy  - Metastatic melanoma, multifocal (largest focus 2.4 cm, with two sub-centimeter nodules also identified microscopically, each 3 mm) - Surgical margins negative  - BRAF (VE1): Negative - TILs: brisk - Two lymph nodes, negative for malignancy (0/2)   07/17/2023 Genetic Testing   [place]: pertinent negatives: BRAF. KIT TERT C.-124C>T variant- promoter mutation NRAS p.G13D Missense variant (exon 2) - GOF TP53 p.R342P ARID1A p.G285fs NF1 p.R1362* PHLPP2 p.Q300* NF1 p.K191_R192delinsN*  Multiple VUS findings.   08/05/2023 Imaging   MRI brain  --Single enhancing right occipital cortical lesion is most concerning for intracranial  metastatic disease.   --Indeterminate 7 mm enhancing lesion in the right posterior parietal bone. There is no definite FDG avidity in this region on the recent PET/CT, arguing against malignancy, however close attention to this area on follow-up imaging is recommended.    08/05/2023 Imaging   CT chest IMPRESSION:   -Right upper lobectomy with moderate sized loculated right pleural effusion. New 0.3 cm right lower lobe micronodule. Recommend attention on follow-up CT in 3 months.   Few subcentimeter right  infra-axillary lymph nodes with adjacent fat stranding and benign morphology, new since prior. Recommendation follow-up CT.   Markedly enlarged left atrium and mild qualitative dilatation of right atrium. Extensive coronary artery calcifications.    09/06/2023 -  Chemotherapy   Patient is on Treatment Plan : METASTATIC MELANOMA Nivolumab  (240) q14d     09/19/2023 Cancer Staging   Staging form: Melanoma of the Skin, AJCC 7th Edition - Clinical: Stage IV (TX, N0, M1c) - Signed by Tina Nicholas BROCKS, MD on 09/19/2023 Biopsy of metastatic site performed: Yes Source of metastatic specimen: Lung   11/19/2023 Imaging   MRI brain: Positive treatment response, previously seen brain lesions are no longer detectable. No new lesion.   11/21/2023 PET scan   PET: after 6C of nivo every 2 weeks. Marked response to therapy, including resolution of hepatic and osseous hypermetabolism.      PHYSICAL EXAMINATION: ECOG PERFORMANCE STATUS: 1  Vitals:   09/04/24 0916  BP: 128/77  Pulse: 62  Resp: 17  Temp: 97.8 F (36.6 C)  SpO2: 99%   Filed Weights   09/04/24 0916  Weight: 182 lb (82.6 kg)    GENERAL: alert, no distress and comfortable SKIN: skin color normal and no jaundice EYES: normal, sclera clear OROPHARYNX: no exudate  NECK: No palpable mass LYMPH:  no palpable cervical, axillary lymphadenopathy  LUNGS: clear to auscultation and no wheeze or rales with normal breathing effort HEART: regular rate & rhythm  ABDOMEN: abdomen soft, non-tender and nondistended. Musculoskeletal: no edema NEURO: no focal motor/sensory deficits.  Relevant data reviewed during this visit included labs.

## 2024-09-04 ENCOUNTER — Inpatient Hospital Stay

## 2024-09-04 ENCOUNTER — Encounter: Payer: Self-pay | Admitting: Interventional Radiology

## 2024-09-04 VITALS — BP 128/77 | HR 62 | Temp 97.8°F | Resp 17 | Ht 71.0 in | Wt 182.0 lb

## 2024-09-04 DIAGNOSIS — I4891 Unspecified atrial fibrillation: Secondary | ICD-10-CM | POA: Insufficient documentation

## 2024-09-04 DIAGNOSIS — M130 Polyarthritis, unspecified: Secondary | ICD-10-CM | POA: Insufficient documentation

## 2024-09-04 DIAGNOSIS — C7931 Secondary malignant neoplasm of brain: Secondary | ICD-10-CM | POA: Diagnosis not present

## 2024-09-04 DIAGNOSIS — Z5112 Encounter for antineoplastic immunotherapy: Secondary | ICD-10-CM | POA: Insufficient documentation

## 2024-09-04 DIAGNOSIS — C434 Malignant melanoma of scalp and neck: Secondary | ICD-10-CM

## 2024-09-04 DIAGNOSIS — C7800 Secondary malignant neoplasm of unspecified lung: Secondary | ICD-10-CM | POA: Diagnosis not present

## 2024-09-04 DIAGNOSIS — Z79899 Other long term (current) drug therapy: Secondary | ICD-10-CM | POA: Diagnosis not present

## 2024-09-04 DIAGNOSIS — Z7952 Long term (current) use of systemic steroids: Secondary | ICD-10-CM | POA: Insufficient documentation

## 2024-09-04 DIAGNOSIS — K769 Liver disease, unspecified: Secondary | ICD-10-CM | POA: Insufficient documentation

## 2024-09-04 LAB — CBC WITH DIFFERENTIAL (CANCER CENTER ONLY)
Abs Immature Granulocytes: 0.02 K/uL (ref 0.00–0.07)
Basophils Absolute: 0.1 K/uL (ref 0.0–0.1)
Basophils Relative: 1 %
Eosinophils Absolute: 0.2 K/uL (ref 0.0–0.5)
Eosinophils Relative: 2 %
HCT: 39 % (ref 39.0–52.0)
Hemoglobin: 13.6 g/dL (ref 13.0–17.0)
Immature Granulocytes: 0 %
Lymphocytes Relative: 11 %
Lymphs Abs: 0.8 K/uL (ref 0.7–4.0)
MCH: 35.6 pg — ABNORMAL HIGH (ref 26.0–34.0)
MCHC: 34.9 g/dL (ref 30.0–36.0)
MCV: 102.1 fL — ABNORMAL HIGH (ref 80.0–100.0)
Monocytes Absolute: 0.5 K/uL (ref 0.1–1.0)
Monocytes Relative: 7 %
Neutro Abs: 5.1 K/uL (ref 1.7–7.7)
Neutrophils Relative %: 79 %
Platelet Count: 178 K/uL (ref 150–400)
RBC: 3.82 MIL/uL — ABNORMAL LOW (ref 4.22–5.81)
RDW: 14.2 % (ref 11.5–15.5)
WBC Count: 6.6 K/uL (ref 4.0–10.5)
nRBC: 0 % (ref 0.0–0.2)

## 2024-09-04 LAB — CMP (CANCER CENTER ONLY)
ALT: 19 U/L (ref 0–44)
AST: 29 U/L (ref 15–41)
Albumin: 4.5 g/dL (ref 3.5–5.0)
Alkaline Phosphatase: 73 U/L (ref 38–126)
Anion gap: 12 (ref 5–15)
BUN: 15 mg/dL (ref 8–23)
CO2: 23 mmol/L (ref 22–32)
Calcium: 9.4 mg/dL (ref 8.9–10.3)
Chloride: 97 mmol/L — ABNORMAL LOW (ref 98–111)
Creatinine: 1.25 mg/dL — ABNORMAL HIGH (ref 0.61–1.24)
GFR, Estimated: 57 mL/min — ABNORMAL LOW
Glucose, Bld: 150 mg/dL — ABNORMAL HIGH (ref 70–99)
Potassium: 4.4 mmol/L (ref 3.5–5.1)
Sodium: 132 mmol/L — ABNORMAL LOW (ref 135–145)
Total Bilirubin: 0.9 mg/dL (ref 0.0–1.2)
Total Protein: 7.2 g/dL (ref 6.5–8.1)

## 2024-09-04 LAB — T4, FREE: Free T4: 1.54 ng/dL (ref 0.80–2.00)

## 2024-09-04 LAB — LACTATE DEHYDROGENASE: LDH: 172 U/L (ref 105–235)

## 2024-09-04 LAB — TSH: TSH: 2.11 u[IU]/mL (ref 0.350–4.500)

## 2024-09-04 MED ORDER — SODIUM CHLORIDE 0.9 % IV SOLN
240.0000 mg | Freq: Once | INTRAVENOUS | Status: AC
  Administered 2024-09-04: 240 mg via INTRAVENOUS
  Filled 2024-09-04: qty 24

## 2024-09-04 MED ORDER — SODIUM CHLORIDE 0.9 % IV SOLN: INTRAVENOUS | Status: DC

## 2024-09-04 NOTE — Patient Instructions (Signed)
 [facility] - A DEPT OF [facility]  Discharge Instructions: Thank you for choosing [facility] to provide your oncology and hematology care.   If you have a lab appointment with the Cancer Center, please go directly to the Cancer Center and check in at the registration area.   Wear comfortable clothing and clothing appropriate for easy access to any Portacath or PICC line.   We strive to give you quality time with your provider. You may need to reschedule your appointment if you arrive late (15 or more minutes).  Arriving late affects you and other patients whose appointments are after yours.  Also, if you miss three or more appointments without notifying the office, you may be dismissed from the clinic at the provider's discretion.      For prescription refill requests, have your pharmacy contact our office and allow 72 hours for refills to be completed.    Today you received the following chemotherapy and/or immunotherapy agents: Opdivo    To help prevent nausea and vomiting after your treatment, we encourage you to take your nausea medication as directed.  BELOW ARE SYMPTOMS THAT SHOULD BE REPORTED IMMEDIATELY: *FEVER GREATER THAN 100.4 F (38 C) OR HIGHER *CHILLS OR SWEATING *NAUSEA AND VOMITING THAT IS NOT CONTROLLED WITH YOUR NAUSEA MEDICATION *UNUSUAL SHORTNESS OF BREATH *UNUSUAL BRUISING OR BLEEDING *URINARY PROBLEMS (pain or burning when urinating, or frequent urination) *BOWEL PROBLEMS (unusual diarrhea, constipation, pain near the anus) TENDERNESS IN MOUTH AND THROAT WITH OR WITHOUT PRESENCE OF ULCERS (sore throat, sores in mouth, or a toothache) UNUSUAL RASH, SWELLING OR PAIN  UNUSUAL VAGINAL DISCHARGE OR ITCHING   Items with * indicate a potential emergency and should be followed up as soon as possible or go to the Emergency Department if any problems should occur.  Please show the CHEMOTHERAPY ALERT CARD or IMMUNOTHERAPY ALERT  CARD at check-in to the Emergency Department and triage nurse.  Should you have questions after your visit or need to cancel or reschedule your appointment, please contact [facility] - A DEPT OF [facility]  Dept: [phone]  and follow the prompts.  Office hours are 8:00 a.m. to 4:30 p.m. Monday - Friday. Please note that voicemails left after 4:00 p.m. may not be returned until the following business day.  We are closed weekends and major holidays. You have access to a nurse at all times for urgent questions. Please call the main number to the clinic Dept: [phone] and follow the prompts.   For any non-urgent questions, you may also contact your provider using MyChart. We now offer e-Visits for anyone 24 and older to request care online for non-urgent symptoms. For details visit [email or web address].   Also download the MyChart app! Go to the app store, search MyChart, open the app, select [facility], and log in with your MyChart username and password.

## 2024-09-08 ENCOUNTER — Other Ambulatory Visit: Payer: Self-pay | Admitting: Interventional Radiology

## 2024-09-08 DIAGNOSIS — R160 Hepatomegaly, not elsewhere classified: Secondary | ICD-10-CM

## 2024-09-09 ENCOUNTER — Other Ambulatory Visit: Payer: Self-pay | Admitting: Interventional Radiology

## 2024-09-09 ENCOUNTER — Other Ambulatory Visit: Payer: Self-pay

## 2024-09-09 ENCOUNTER — Telehealth: Payer: Self-pay | Admitting: Student in an Organized Health Care Education/Training Program

## 2024-09-09 DIAGNOSIS — R160 Hepatomegaly, not elsewhere classified: Secondary | ICD-10-CM

## 2024-09-09 DIAGNOSIS — M130 Polyarthritis, unspecified: Secondary | ICD-10-CM

## 2024-09-09 NOTE — Telephone Encounter (Signed)
"  ° °  Pre-operative Risk Assessment    Patient Name: Nicholas Bird  DOB: Oct 21, 1940 MRN: [id number]   Date of last office visit: 05/20/24 Date of next office visit: N/A   Request for Surgical Clearance    Procedure:  Microwave Ablation of the Liver and Liver By Opsy  Date of Surgery:  Clearance TBD                                Surgeon:  Dr. Thom Hall Surgeon's Group or Practice Name:  [facility] Phone number:  [phone] Fax number:  [phone]   Type of Clearance Requested:   - Pharmacy:  Hold Apixaban  (Eliquis )     Type of Anesthesia:  General    Additional requests/questions:  Please advise surgeon/provider what medications should be held.  Signed, Belisicia T Woods   09/09/2024, 1:27 PM   "

## 2024-09-10 ENCOUNTER — Ambulatory Visit (HOSPITAL_COMMUNITY)
Admission: RE | Admit: 2024-09-10 | Discharge: 2024-09-10 | Disposition: A | Discharge status: Home / Self Care | Source: Ambulatory Visit | Attending: Interventional Radiology | Admitting: Interventional Radiology

## 2024-09-10 DIAGNOSIS — R160 Hepatomegaly, not elsewhere classified: Secondary | ICD-10-CM | POA: Insufficient documentation

## 2024-09-10 MED ORDER — GADOBUTROL 1 MMOL/ML IV SOLN
8.0000 mL | Freq: Once | INTRAVENOUS | Status: AC | PRN
  Administered 2024-09-10: 8 mL via INTRAVENOUS

## 2024-09-16 NOTE — Telephone Encounter (Signed)
 I will send to preop APP and pharm d as looks like we are needing recommendations for blood thinner.

## 2024-09-16 NOTE — Telephone Encounter (Signed)
 Correction for procedure: microwave ablation of the liver and liver Bx.

## 2024-09-16 NOTE — Telephone Encounter (Signed)
 Nicholas Bird is calling to check status of medical clearance

## 2024-09-17 ENCOUNTER — Telehealth (HOSPITAL_BASED_OUTPATIENT_CLINIC_OR_DEPARTMENT_OTHER): Payer: Self-pay | Admitting: *Deleted

## 2024-09-17 DIAGNOSIS — K769 Liver disease, unspecified: Secondary | ICD-10-CM | POA: Insufficient documentation

## 2024-09-17 NOTE — Telephone Encounter (Signed)
 I was sent a secure chat from Nicholas Bird with Nicholas Bird office, checking on if pt had been cleared.  See secure chat notes:   Nicholas Bird: Good morning, I also put in a request for a cardiac clearance as well for this patient. Can you check on it for me please?   Me: looks like pt has been cleared. Telephone 09/09/2024 [facility] [employer] [facility] [employer]. [facility]  Almetta Donnice LABOR, MD Cardiology clearance TBD Reason for conversation  All Conversations: clearance TBD (Newest Message First)  Nicholas Nicholas CROME, NP  09/17/24 9:30 AM Note   Patient Name: Nicholas Bird  DOB: 1940-12-25 MRN: [id number]  Primary Cardiologist: None  Clinical pharmacists have reviewed the patient's past medical history, labs, and current medications as part of preoperative protocol coverage. The following recommendations have been made:    Patient has not had an Afib/aflutter ablation in the last 3 months, DCCV within the last 4 weeks or a watchman implanted in the last 45 days   Per office protocol, patient can hold Eliquis  for 2 days prior to procedure.     I will route this recommendation to the requesting party via Epic fax function and remove from pre-op pool.  Please call with questions.  Nicholas Bird Rana, NP 09/17/2024, 9:30 AM   Nicholas Bird: Ok, I wasn't sure if that was just the Eliquis  or both. Good. Thank you!   ME: are you needing medical/cardiac clearance as well? as this looks like just Eliquis  hold   Nicholas Bird: Yes, please. I had requested it when I called in the blood thinner clearance   ME: the operator you s/w noted this: Type of Clearance Requested:  - Pharmacy: Hold Apixaban  (Eliquis )   Type of Anesthesia: General   Additional requests/questions: Please advise surgeon/provider what medications should be held.  Signed, Nicholas Bird  09/09/2024, 1:27 PM  ME: best to just fax over the request to [phone] preop team so that it is all noted correctly    Preop APP M. Rana, NP: [name] if he needs medical clearance can you send it back to preop pool so I can look over it   ME: Nicholas Bird I will send back to preop for medical clearance   Nicholas Bird: Thank you!!   ME: you are welcome Nicholas

## 2024-09-17 NOTE — Telephone Encounter (Signed)
"  ° °  Patient Name: Nicholas Bird  DOB: 1940/12/01 MRN: [id number]  Primary Cardiologist: None  Clinical pharmacists have reviewed the patient's past medical history, labs, and current medications as part of preoperative protocol coverage. The following recommendations have been made:    Patient has not had an Afib/aflutter ablation in the last 3 months, DCCV within the last 4 weeks or a watchman implanted in the last 45 days    Per office protocol, patient can hold Eliquis  for 2 days prior to procedure.      I will route this recommendation to the requesting party via Epic fax function and remove from pre-op pool.  Please call with questions.  Lum LITTIE Louis, NP 09/17/2024, 9:30 AM  "

## 2024-09-17 NOTE — Progress Notes (Signed)
 Nicholas Bird OFFICE PROGRESS NOTE  Patient Care Team: Nicholas Bird, Nicholas Bird as PCP - General (Family Medicine) Nicholas Bird, Nicholas Bird (Inactive) as PCP - Electrophysiology (Cardiology) Nicholas Bird, Nicholas Bird (Inactive) (Cardiology) Nicholas Bird, Nicholas Bird as Attending Physician (Urology) Nicholas Bird, Nicholas Bird as Consulting Physician (Dermatology) Nicholas Bird, Nicholas Bird as Consulting Physician (Ophthalmology) Nicholas Bird, Nicholas Bird as Consulting Physician (Gastroenterology) Nicholas Bird, Nicholas Bird as Pharmacist (Pharmacist) Nicholas Olam BIRCH, RN as [facility] Care Management  84 y.o.man undergoing nivolumab  for stage IV melanoma, HTN, Afib on apixaban , GERD, gout, BPH, being follow up for metastatic melanoma on treatment.    Previous immune related side effects of diarrhea resolved. Previous polyarthritis resolved.  No recurrence.    Current Diagnosis: Metastatic melanoma with brain and lung metastases.  Suspicious for bone metastases. BRAF negative. 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 (Started 09/06/23) 08/19/23 SRS to 4 brain mets  Clinical stable.  No new arthritis, rash or side effects.  Continue prednisone  3 times weekly without any issues. Assessment & Plan Melanoma of scalp (HCC) Continue immunotherapy with nivolumab  Monitor clinical signs of worsening immune related side effects Return in 2 weeks with labs visit and treatment Liver lesion Planning on biopsy on 1/21 with IR Polyarthritis Patient is on apixaban  for history of A-fib.   Will continue Prednisone  to 5 mg 3 times weekly. Vit D/Ca daily    Nicholas [name] Bird, Nicholas Bird  INTERVAL HISTORY: Patient returns for follow-up. No joint pain, diarrhea, stomach pain, short of breath, chest pain, itching.  Oncology History  Melanoma of scalp (HCC)  01/01/2015 Initial Diagnosis   Malignant melanoma (HCC)   12/2014 Surgery   April 2016 WLE and SLN 01/31/2015: Reexcision    02/25/2018 Imaging   CT  CAP Subcentimeter right upper lobe subpleural and left upper lobe perifissural nodules, new from 01/24/2016, indeterminate. Recommend short interval follow-up (3 months), given histor    04/07/2019 Imaging   CT Chest: No evidence of metastatic disease within the chest. Left upper lobe perifissural nodule no longer evident. Right upper lobe subpleural nodule no longer evident.   CT AP No evidence of metastatic disease.     04/2020 Pathology Results   Nicholas Bird 2021: New scalp lesion, in transit  path: -Malignant melanoma, involving dermal tissue    04/12/2020 Imaging   CT chest: NED CT AP: NED    05/04/2020 Imaging   CT Neck: No mass or lymphadenopathy within the neck.    05/25/2020 - 07/25/2020 Chemotherapy   5 cycles of TVEC   01/24/2021 PET scan   PET whole body - No abnormal foci of increased radiotracer uptake to suggest metastatic disease.  -Small focus of increased uptake within the proximal sigmoid colon without adjacent stranding or definite wall thickening, indeterminant. Recommend further evaluation with colonoscopy if clinically    08/24/2021 PET scan   PET whole body - No abnormal foci of increased radiotracer uptake to suggest metastatic disease.   - Small focus of increased uptake within the proximal sigmoid colon, similar to 01/24/2021. Recommend clinical correlation and/or further evaluation with colonoscopy if clinically indicated    01/31/2022 Surgery   (Outside case, [phone], 1 slide, 01/23/2022) Skin, left inferior lateral anterior scalp, shave - Metastatic malignant melanoma Size: 2 mm Pattern: Pigmented epithelioid cells forming small plaque in superficial dermis and showing epidermotropism Tumor infiltrating lymphocytes: Brisk Nuclear grade: 3/3 Margins: Focally present adjacent to edge of tissue sections    02/21/2022 - 05/09/2022 Chemotherapy   had  an intransit recurrence of melanoma and was retreated with TVEC.  Biopsy in October 2023 showed NED.     05/09/2022 PET scan   - Mildly avid 3 mm right upper lobe subcentimeter pulmonary nodule, possibly increased from 2 mm on prior; recommend attention on follow-up.   -No definite evidence of metabolically active disease.    05/28/2022 Pathology Results   Diagnosis   Skin, Left temple, excision - Dermal fibrosis with lymphocytic and granulomatous inflammation consistent with site of recent therapy - No residual melanoma identified - Margins free of tumor      02/15/2023 PET scan   - Focal area of hypermetabolic activity is seen in the ascending colon, indeterminate. Recommend correlation with colonoscopy.   -Unchanged size of mildly hypermetabolic left paratracheal lymph node, indeterminate. Recommend attention on follow-up imaging.   -Pancreatic calcifications likely reflect sequela of chronic pancreatitis.   -Bladder diverticuli and enlarged prostate.    05/13/2023 PET scan   1.  Evidence of progressive disease as demonstrated by interval enlargement of right upper lobe pulmonary nodule most concerning for metastasis by melanoma. Additional single right pulmonary nodule and 2 sites of focal osseous avidity which are suspicious but indeterminate for additional sites of involvement by metastasis involving the T8 and L1 vertebral bodies.  2.  Focal uptake within the sigmoid colon which has increased in conspicuity over time. Recommend correlation with colonoscopy if not recently completed with differential diagnosis including adenoma, carcinoma, and inflammatory lesion (given associated coarse calcifications/high density material). No evidence of complication (i.e.  fluid collection, or free air) by low-dose unenhanced CT.  3.  Persistent FDG abnormality of the left anterior shin associated with a dermal nodule. Recommend direct inspection as additional site of cutaneous neoplasm is not excluded.    06/14/2023 Pathology Results   A: Lung, right upper lobe, lobectomy  - Metastatic melanoma,  multifocal (largest focus 2.4 cm, with two sub-centimeter nodules also identified microscopically, each 3 mm) - Surgical margins negative  - BRAF (VE1): Negative - TILs: brisk - Two lymph nodes, negative for malignancy (0/2)   07/17/2023 Genetic Testing   [facility]: pertinent negatives: BRAF. KIT TERT C.-124C>T variant- promoter mutation NRAS p.G13D Missense variant (exon 2) - GOF TP53 p.R342P ARID1A p.G285fs NF1 p.R1362* PHLPP2 p.Q300* NF1 p.K191_R192delinsN*  Multiple VUS findings.   08/05/2023 Imaging   MRI brain  --Single enhancing right occipital cortical lesion is most concerning for intracranial metastatic disease.   --Indeterminate 7 mm enhancing lesion in the right posterior parietal bone. There is no definite FDG avidity in this region on the recent PET/CT, arguing against malignancy, however close attention to this area on follow-up imaging is recommended.    08/05/2023 Imaging   CT chest IMPRESSION:   -Right upper lobectomy with moderate sized loculated right pleural effusion. New 0.3 cm right lower lobe micronodule. Recommend attention on follow-up CT in 3 months.   Few subcentimeter right infra-axillary lymph nodes with adjacent fat stranding and benign morphology, new since prior. Recommendation follow-up CT.   Markedly enlarged left atrium and mild qualitative dilatation of right atrium. Extensive coronary artery calcifications.    09/06/2023 -  Chemotherapy   Patient is on Treatment Plan : METASTATIC MELANOMA Nivolumab  (240) q14d     09/19/2023 Cancer Staging   Staging form: Melanoma of the Skin, AJCC 7th Edition - Clinical: Stage IV (TX, N0, M1c) - Signed by Tina Nicholas Bird, Nicholas Bird on 09/19/2023 Biopsy of metastatic site performed: Yes Source of metastatic specimen: Lung   11/19/2023 Imaging  MRI brain: Positive treatment response, previously seen brain lesions are no longer detectable. No new lesion.   11/21/2023 PET scan   PET: after 6C of nivo every 2 weeks.  Marked response to therapy, including resolution of hepatic and osseous hypermetabolism.      PHYSICAL EXAMINATION: ECOG PERFORMANCE STATUS: 1  Vitals:   09/18/24 1120  BP: 130/75  Pulse: 74  Resp: 17  Temp: 97.8 F (36.6 C)  SpO2: 100%   Filed Weights   09/18/24 1120  Weight: 189 lb 11.2 oz (86 kg)    GENERAL: alert, no distress and comfortable SKIN: skin color normal and no jaundice  LUNGS: clear to auscultation and no wheeze or rales with normal breathing effort HEART: regular rate & rhythm  ABDOMEN: abdomen soft, non-tender and nondistended. Musculoskeletal: no edema   Relevant data reviewed during this visit included labs.

## 2024-09-17 NOTE — Telephone Encounter (Signed)
 Patient with diagnosis of afib on Eliquis  for anticoagulation.    Procedure: Microwave Ablation of the Liver and Liver By Opsy  Date of procedure: TBD   CHA2DS2-VASc Score = 5   This indicates a 7.2% annual risk of stroke. The patient's score is based upon: CHF History: 0 HTN History: 1 Diabetes History: 0 Stroke History: 2 Vascular Disease History: 0 Age Score: 2 Gender Score: 0      CrCl 53 ml/min Platelet count 178  Patient has not had an Afib/aflutter ablation in the last 3 months, DCCV within the last 4 weeks or a watchman implanted in the last 45 days   Per office protocol, patient can hold Eliquis  for 2 days prior to procedure.    **This guidance is not considered finalized until pre-operative APP has relayed final recommendations.**

## 2024-09-17 NOTE — Assessment & Plan Note (Addendum)
 Continue immunotherapy with nivolumab  Monitor clinical signs of worsening immune related side effects Return in 2 weeks with labs visit and treatment

## 2024-09-17 NOTE — Telephone Encounter (Signed)
" ° °  Name: Nicholas Bird  DOB: 20-Aug-1941  MRN: [id number]  Primary Cardiologist: None   Preoperative team, please contact this patient and set up a phone call appointment for further preoperative risk assessment. Please obtain consent and complete medication review. Thank you for your help.  I confirm that guidance regarding antiplatelet and oral anticoagulation therapy has been completed and, if necessary, noted below.   Per office protocol, patient can hold Eliquis  for 2 days prior to procedure.    I also confirmed the patient resides in the state of [place] . As per [facility] Medical Board telemedicine laws, the patient must reside in the state in which the provider is licensed.   Nicholas LITTIE Louis, NP 09/17/2024, 11:06 AM [facility]    "

## 2024-09-17 NOTE — Telephone Encounter (Signed)
 Pt has been added on tomorrow as when I s/w the pt to schedule tele preop appt he tells ,he tells me the procedure is Wed 09/23/24. I stated that we were not aware of the date for the procedure as request was faxed to us  as TBD.   I d/w the preop APP to add on tomorrow, [name] gave the ok. Pt was offered 2:40 but said he is going to be getting an infusion tomorrow and not sure he will be done in time for 2:40. Pt can do call at 3:40.   Med rec and consent are done.     Patient Consent for Virtual Visit        Nicholas Bird has provided verbal consent on 09/17/2024 for a virtual visit (video or telephone).   CONSENT FOR VIRTUAL VISIT FOR:  Nicholas [name] Bird  By participating in this virtual visit I agree to the following:  I hereby voluntarily request, consent and authorize [facility] and its employed or contracted physicians, physician assistants, nurse practitioners or other licensed health care professionals (the Practitioner), to provide me with telemedicine health care services (the Services) as deemed necessary by the treating Practitioner. I acknowledge and consent to receive the Services by the Practitioner via telemedicine. I understand that the telemedicine visit will involve communicating with the Practitioner through live audiovisual communication technology and the disclosure of certain medical information by electronic transmission. I acknowledge that I have been given the opportunity to request an in-person assessment or other available alternative prior to the telemedicine visit and am voluntarily participating in the telemedicine visit.  I understand that I have the right to withhold or withdraw my consent to the use of telemedicine in the course of my care at any time, without affecting my right to future care or treatment, and that the Practitioner or I may terminate the telemedicine visit at any time. I understand that I have the right to inspect all information  obtained and/or recorded in the course of the telemedicine visit and may receive copies of available information for a reasonable fee.  I understand that some of the potential risks of receiving the Services via telemedicine include:  Delay or interruption in medical evaluation due to technological equipment failure or disruption; Information transmitted may not be sufficient (e.g. poor resolution of images) to allow for appropriate medical decision making by the Practitioner; and/or  In rare instances, security protocols could fail, causing a breach of personal health information.  Furthermore, I acknowledge that it is my responsibility to provide information about my medical history, conditions and care that is complete and accurate to the best of my ability. I acknowledge that Practitioner's advice, recommendations, and/or decision may be based on factors not within their control, such as incomplete or inaccurate data provided by me or distortions of diagnostic images or specimens that may result from electronic transmissions. I understand that the practice of medicine is not an exact science and that Practitioner makes no warranties or guarantees regarding treatment outcomes. I acknowledge that a copy of this consent can be made available to me via my patient portal [facility] MyChart), or I can request a printed copy by calling the office of [facility].    I understand that my insurance will be billed for this visit.   I have read or had this consent read to me. I understand the contents of this consent, which adequately explains the benefits and risks of the Services being  provided via telemedicine.  I have been provided ample opportunity to ask questions regarding this consent and the Services and have had my questions answered to my satisfaction. I give my informed consent for the services to be provided through the use of telemedicine in my medical care

## 2024-09-17 NOTE — Telephone Encounter (Signed)
 Pt has been added on tomorrow as when I s/w the pt to schedule tele preop appt he tells ,he tells me the procedure is Wed 09/23/24. I stated that we were not aware of the date for the procedure as request was faxed to us  as TBD.   I d/w the preop APP to add on tomorrow, [name] gave the ok. Pt was offered 2:40 but said he is going to be getting an infusion tomorrow and not sure he will be done in time for 2:40. Pt can do call at 3:40.   Med rec and consent are done.

## 2024-09-18 ENCOUNTER — Ambulatory Visit: Attending: Cardiology | Admitting: Emergency Medicine

## 2024-09-18 ENCOUNTER — Inpatient Hospital Stay

## 2024-09-18 VITALS — BP 130/75 | HR 74 | Temp 97.8°F | Resp 17 | Ht 71.0 in | Wt 189.7 lb

## 2024-09-18 VITALS — BP 141/81 | HR 74 | Temp 97.9°F | Resp 18

## 2024-09-18 DIAGNOSIS — C434 Malignant melanoma of scalp and neck: Secondary | ICD-10-CM | POA: Diagnosis not present

## 2024-09-18 DIAGNOSIS — Z01810 Encounter for preprocedural cardiovascular examination: Secondary | ICD-10-CM

## 2024-09-18 DIAGNOSIS — M130 Polyarthritis, unspecified: Secondary | ICD-10-CM

## 2024-09-18 DIAGNOSIS — Z5112 Encounter for antineoplastic immunotherapy: Secondary | ICD-10-CM | POA: Diagnosis not present

## 2024-09-18 DIAGNOSIS — K769 Liver disease, unspecified: Secondary | ICD-10-CM | POA: Diagnosis not present

## 2024-09-18 LAB — CMP (CANCER CENTER ONLY)
ALT: 17 U/L (ref 0–44)
AST: 25 U/L (ref 15–41)
Albumin: 4.6 g/dL (ref 3.5–5.0)
Alkaline Phosphatase: 73 U/L (ref 38–126)
Anion gap: 12 (ref 5–15)
BUN: 18 mg/dL (ref 8–23)
CO2: 25 mmol/L (ref 22–32)
Calcium: 9.7 mg/dL (ref 8.9–10.3)
Chloride: 97 mmol/L — ABNORMAL LOW (ref 98–111)
Creatinine: 1.13 mg/dL (ref 0.61–1.24)
GFR, Estimated: >60 mL/min
Glucose, Bld: 171 mg/dL — ABNORMAL HIGH (ref 70–99)
Potassium: 4 mmol/L (ref 3.5–5.1)
Sodium: 133 mmol/L — ABNORMAL LOW (ref 135–145)
Total Bilirubin: 0.8 mg/dL (ref 0.0–1.2)
Total Protein: 7.5 g/dL (ref 6.5–8.1)

## 2024-09-18 LAB — CBC WITH DIFFERENTIAL (CANCER CENTER ONLY)
Abs Immature Granulocytes: 0.03 K/uL (ref 0.00–0.07)
Basophils Absolute: 0 K/uL (ref 0.0–0.1)
Basophils Relative: 1 %
Eosinophils Absolute: 0.1 K/uL (ref 0.0–0.5)
Eosinophils Relative: 1 %
HCT: 39.9 % (ref 39.0–52.0)
Hemoglobin: 14 g/dL (ref 13.0–17.0)
Immature Granulocytes: 0 %
Lymphocytes Relative: 9 %
Lymphs Abs: 0.6 K/uL — ABNORMAL LOW (ref 0.7–4.0)
MCH: 35.4 pg — ABNORMAL HIGH (ref 26.0–34.0)
MCHC: 35.1 g/dL (ref 30.0–36.0)
MCV: 100.8 fL — ABNORMAL HIGH (ref 80.0–100.0)
Monocytes Absolute: 0.5 K/uL (ref 0.1–1.0)
Monocytes Relative: 7 %
Neutro Abs: 5.7 K/uL (ref 1.7–7.7)
Neutrophils Relative %: 82 %
Platelet Count: 208 K/uL (ref 150–400)
RBC: 3.96 MIL/uL — ABNORMAL LOW (ref 4.22–5.81)
RDW: 14.4 % (ref 11.5–15.5)
WBC Count: 6.9 K/uL (ref 4.0–10.5)
nRBC: 0 % (ref 0.0–0.2)

## 2024-09-18 LAB — TSH: TSH: 1.77 u[IU]/mL (ref 0.350–4.500)

## 2024-09-18 LAB — LACTATE DEHYDROGENASE: LDH: 157 U/L (ref 105–235)

## 2024-09-18 LAB — T4, FREE: Free T4: 1.3 ng/dL (ref 0.80–2.00)

## 2024-09-18 MED ORDER — SODIUM CHLORIDE 0.9 % IV SOLN: INTRAVENOUS | Status: DC

## 2024-09-18 MED ORDER — SODIUM CHLORIDE 0.9 % IV SOLN
240.0000 mg | Freq: Once | INTRAVENOUS | Status: AC
  Administered 2024-09-18: 240 mg via INTRAVENOUS
  Filled 2024-09-18: qty 24

## 2024-09-18 NOTE — Patient Instructions (Signed)
 [facility] - A DEPT OF [facility]  Discharge Instructions: Thank you for choosing [facility] to provide your oncology and hematology care.   If you have a lab appointment with the Cancer Center, please go directly to the Cancer Center and check in at the registration area.   Wear comfortable clothing and clothing appropriate for easy access to any Portacath or PICC line.   We strive to give you quality time with your provider. You may need to reschedule your appointment if you arrive late (15 or more minutes).  Arriving late affects you and other patients whose appointments are after yours.  Also, if you miss three or more appointments without notifying the office, you may be dismissed from the clinic at the provider's discretion.      For prescription refill requests, have your pharmacy contact our office and allow 72 hours for refills to be completed.    Today you received the following chemotherapy and/or immunotherapy agents: Opdivo    To help prevent nausea and vomiting after your treatment, we encourage you to take your nausea medication as directed.  BELOW ARE SYMPTOMS THAT SHOULD BE REPORTED IMMEDIATELY: *FEVER GREATER THAN 100.4 F (38 C) OR HIGHER *CHILLS OR SWEATING *NAUSEA AND VOMITING THAT IS NOT CONTROLLED WITH YOUR NAUSEA MEDICATION *UNUSUAL SHORTNESS OF BREATH *UNUSUAL BRUISING OR BLEEDING *URINARY PROBLEMS (pain or burning when urinating, or frequent urination) *BOWEL PROBLEMS (unusual diarrhea, constipation, pain near the anus) TENDERNESS IN MOUTH AND THROAT WITH OR WITHOUT PRESENCE OF ULCERS (sore throat, sores in mouth, or a toothache) UNUSUAL RASH, SWELLING OR PAIN  UNUSUAL VAGINAL DISCHARGE OR ITCHING   Items with * indicate a potential emergency and should be followed up as soon as possible or go to the Emergency Department if any problems should occur.  Please show the CHEMOTHERAPY ALERT CARD or IMMUNOTHERAPY ALERT  CARD at check-in to the Emergency Department and triage nurse.  Should you have questions after your visit or need to cancel or reschedule your appointment, please contact [facility] - A DEPT OF [facility]  Dept: [phone]  and follow the prompts.  Office hours are 8:00 a.m. to 4:30 p.m. Monday - Friday. Please note that voicemails left after 4:00 p.m. may not be returned until the following business day.  We are closed weekends and major holidays. You have access to a nurse at all times for urgent questions. Please call the main number to the clinic Dept: [phone] and follow the prompts.   For any non-urgent questions, you may also contact your provider using MyChart. We now offer e-Visits for anyone 24 and older to request care online for non-urgent symptoms. For details visit [email or web address].   Also download the MyChart app! Go to the app store, search MyChart, open the app, select [facility], and log in with your MyChart username and password.

## 2024-09-18 NOTE — Progress Notes (Signed)
 "   Virtual Visit via Telephone Note   Because of Nicholas Bird co-morbid illnesses, he is at least at moderate risk for complications without adequate follow up.  This format is felt to be most appropriate for this patient at this time.  Due to technical limitations with video connection (technology), today's appointment will be conducted as an audio only telehealth visit, and Nicholas Bird verbally agreed to proceed in this manner.   All issues noted in this document were discussed and addressed.  No physical exam could be performed with this format.  Evaluation Performed:  Preoperative cardiovascular risk assessment _____________   Date:  09/18/2024   Patient ID:  Nicholas Bird, DOB 1940/12/27, MRN [id number] Patient Location:  Home Provider location:   Office  Primary Care Provider:  [name], MD Primary Cardiologist:  None  Chief Complaint / Patient Profile   84 y.o. y/o male with a h/o atrial fibrillation, bradycardia, hyperlipidemia, hypertension, iron deficiency anemia, metastatic melanoma to brain who is pending microwave ablation of the liver and liver biopsy on date TBD with [facility] and presents today for telephonic preoperative cardiovascular risk assessment.  History of Present Illness    Nicholas Bird is a 84 y.o. male who presents via audio/video conferencing for a telehealth visit today.  Pt was last seen in cardiology clinic on 05/20/2024 by Daphne Barrack, NP.  At that time Nicholas Bird was doing well.  The patient is now pending procedure as outlined above. Since his last visit, he is doing well without acute cardiovascular concerns or complaints.  He remains cardiac unaware of his arrhythmia.  He denies any palpitations or episodes of fast heart rates.  Reports that his blood pressure is well-controlled at home.  He denies chest pains, dyspnea, orthopnea, syncope, or dizziness.  He remains active going for walks frequently and routinely plays golf  without exertional symptoms.  No symptoms to suggest angina.  He can complete greater than 4 METS.  Past Medical History    Past Medical History:  Diagnosis Date   Anticoagulant long-term use    Atrial fibrillation, permanent (HCC)    CARIOLOGIST-  DR FERNANDE   Bladder diverticulum    BPH (benign prostatic hypertrophy)    ED (erectile dysfunction) of organic origin    Elevated LFTs    CHRONIC   Gall stones    Gout    per pt 12-09-2013  gout is stable   Hematuria    History of pancreatitis    10/2012--  resolved    History of TIA (transient ischemic attack)    04/2009--  no residual   Hyperlipemia    Hypertension    Liver disease    Melanoma (HCC)    Personal history of colonic polyps 07/12/2008   TUBULAR ADENOMA   Rosacea    Past Surgical History:  Procedure Laterality Date   CARDIAC CATHETERIZATION  09-19-1998   false positive stress test---  normal cath and preserved lvf   CATARACT EXTRACTION W/ INTRAOCULAR LENS  IMPLANT, BILATERAL Bilateral 2008   CYSTOSCOPY WITH BIOPSY N/A 12/14/2013   Procedure: CYSTOSCOPY WITH BIOPSY;  Surgeon: Garnette Shack, MD;  Location: [facility];  Service: Urology;  Laterality: N/A;   INGUINAL HERNIA REPAIR Left 01-14-2001   IR RADIOLOGIST EVAL & MGMT  08/24/2024   LAPAROSCOPIC CHOLECYSTECTOMY  10-21-2008   AND LIVER BX'S   LAPAROSCOPIC INGUINAL HERNIA REPAIR Bilateral 05-22-2009    Allergies  Allergies[1]  Home Medications    Prior to Admission medications  Medication Sig Start Date End Date Taking? Authorizing Provider  allopurinol  (ZYLOPRIM ) 300 MG tablet TAKE 1 TABLET BY MOUTH EVERY DAY 08/24/24   [name], MD  amLODipine  (NORVASC ) 2.5 MG tablet Take 1 tablet (2.5 mg total) by mouth daily. 07/22/24   [name], MD  apixaban  (ELIQUIS ) 5 MG TABS tablet Take 1 tablet (5 mg total) by mouth 2 (two) times daily. 06/17/24   Almetta Donnice LABOR, MD  azelastine  (ASTELIN ) 0.1 % nasal spray Place 2 sprays into  both nostrils 2 (two) times daily. 07/22/24   [name], MD  ferrous sulfate 325 (65 FE) MG tablet Take 325 mg by mouth daily.    [provider]  finasteride (PROSCAR) 5 MG tablet Take 5 mg by mouth. 10/21/14   [provider]  furosemide  (LASIX ) 40 MG tablet Take 1 tablet (40 mg total) by mouth every other day as needed. 07/22/24   [name], MD  metoprolol  succinate (TOPROL -XL) 50 MG 24 hr tablet TAKE 1 TABLET BY MOUTH EVERY DAY 06/15/24   Ollis, Daphne L, NP  predniSONE  (DELTASONE ) 5 MG tablet TAKE 1 TABLET BY MOUTH 3 TIMES A WEEK. 09/09/24   Tina [name], MD  tamsulosin  (FLOMAX ) 0.4 MG CAPS capsule Take 1 capsule (0.4 mg total) by mouth daily. 07/04/23   [name], MD  valsartan  (DIOVAN ) 80 MG tablet TAKE 1 TABLET BY MOUTH EVERY DAY 06/03/24   Lesia Ozell Barter, PA-C    Physical Exam    Vital Signs:  WHITLEY STRYCHARZ Bird does not have vital signs available for review today.  Given telephonic nature of communication, physical exam is limited. AAOx3. NAD. Normal affect.  Speech and respirations are unlabored.  Accessory Clinical Findings    None  Assessment & Plan    1.  Preoperative Cardiovascular Risk Assessment: According to the Revised Cardiac Risk Index (RCRI), his Perioperative Risk of Major Cardiac Event is (%): 0.9. His Functional Capacity in METs is: 5.81 according to the [facility] Activity Status Index (DASI).  Therefore, based on ACC/AHA guidelines, patient would be at acceptable risk for the planned procedure without further cardiovascular testing. I will route this recommendation to the requesting party via Epic fax function.  The patient was advised that if he develops new symptoms prior to surgery to contact our office to arrange for a follow-up visit, and he verbalized understanding.  Per office protocol, patient can hold Eliquis  for 2 days prior to procedure.    A copy of this note will be routed to requesting surgeon.  Time:    Today, I have spent 6 minutes with the patient with telehealth technology discussing medical history, symptoms, and management plan.     Lum LITTIE Louis, NP  09/18/2024, 3:50 PM     [1]  Allergies Allergen Reactions   Pertussis Vaccines    "

## 2024-09-18 NOTE — Assessment & Plan Note (Addendum)
 Planning on biopsy on 1/21 with IR

## 2024-09-18 NOTE — Assessment & Plan Note (Addendum)
 Patient is on apixaban  for history of A-fib.   Will continue Prednisone  to 5 mg 3 times weekly. Vit D/Ca daily

## 2024-09-22 ENCOUNTER — Other Ambulatory Visit: Payer: Self-pay | Admitting: Interventional Radiology

## 2024-09-22 ENCOUNTER — Other Ambulatory Visit (HOSPITAL_COMMUNITY): Payer: Self-pay | Admitting: Radiology

## 2024-09-22 DIAGNOSIS — C7931 Secondary malignant neoplasm of brain: Secondary | ICD-10-CM

## 2024-09-22 DIAGNOSIS — R160 Hepatomegaly, not elsewhere classified: Secondary | ICD-10-CM

## 2024-09-23 ENCOUNTER — Other Ambulatory Visit: Payer: Self-pay | Admitting: Student

## 2024-09-23 ENCOUNTER — Ambulatory Visit (HOSPITAL_COMMUNITY)
Admission: RE | Admit: 2024-09-23 | Discharge: 2024-09-23 | Disposition: A | Source: Ambulatory Visit | Attending: Interventional Radiology

## 2024-09-23 ENCOUNTER — Encounter (HOSPITAL_COMMUNITY): Payer: Self-pay

## 2024-09-23 ENCOUNTER — Ambulatory Visit (HOSPITAL_COMMUNITY)
Admission: RE | Admit: 2024-09-23 | Discharge: 2024-09-23 | Disposition: A | Discharge status: Home / Self Care | Source: Ambulatory Visit | Attending: Interventional Radiology | Admitting: Interventional Radiology

## 2024-09-23 VITALS — BP 131/76 | HR 68 | Temp 98.2°F | Resp 18

## 2024-09-23 DIAGNOSIS — R160 Hepatomegaly, not elsewhere classified: Secondary | ICD-10-CM

## 2024-09-23 DIAGNOSIS — K769 Liver disease, unspecified: Secondary | ICD-10-CM | POA: Insufficient documentation

## 2024-09-23 DIAGNOSIS — Z85820 Personal history of malignant melanoma of skin: Secondary | ICD-10-CM | POA: Diagnosis not present

## 2024-09-23 DIAGNOSIS — C7931 Secondary malignant neoplasm of brain: Secondary | ICD-10-CM | POA: Diagnosis present

## 2024-09-23 DIAGNOSIS — R7989 Other specified abnormal findings of blood chemistry: Secondary | ICD-10-CM

## 2024-09-23 DIAGNOSIS — C787 Secondary malignant neoplasm of liver and intrahepatic bile duct: Secondary | ICD-10-CM | POA: Insufficient documentation

## 2024-09-23 LAB — CBC WITH DIFFERENTIAL/PLATELET
Abs Immature Granulocytes: 0.02 K/uL (ref 0.00–0.07)
Basophils Absolute: 0 K/uL (ref 0.0–0.1)
Basophils Relative: 1 %
Eosinophils Absolute: 0.1 K/uL (ref 0.0–0.5)
Eosinophils Relative: 2 %
HCT: 39.3 % (ref 39.0–52.0)
Hemoglobin: 13.3 g/dL (ref 13.0–17.0)
Immature Granulocytes: 0 %
Lymphocytes Relative: 14 %
Lymphs Abs: 0.8 K/uL (ref 0.7–4.0)
MCH: 35.5 pg — ABNORMAL HIGH (ref 26.0–34.0)
MCHC: 33.8 g/dL (ref 30.0–36.0)
MCV: 104.8 fL — ABNORMAL HIGH (ref 80.0–100.0)
Monocytes Absolute: 0.9 K/uL (ref 0.1–1.0)
Monocytes Relative: 16 %
Neutro Abs: 3.8 K/uL (ref 1.7–7.7)
Neutrophils Relative %: 67 %
Platelets: 202 K/uL (ref 150–400)
RBC: 3.75 MIL/uL — ABNORMAL LOW (ref 4.22–5.81)
RDW: 14.6 % (ref 11.5–15.5)
WBC: 5.6 K/uL (ref 4.0–10.5)
nRBC: 0 % (ref 0.0–0.2)

## 2024-09-23 LAB — PROTIME-INR
INR: 1 (ref 0.8–1.2)
Prothrombin Time: 13.5 s (ref 11.4–15.2)

## 2024-09-23 MED ORDER — MIDAZOLAM HCL 2 MG/2ML IJ SOLN
INTRAMUSCULAR | Status: AC
  Filled 2024-09-23: qty 2

## 2024-09-23 MED ORDER — FENTANYL CITRATE (PF) 100 MCG/2ML IJ SOLN
INTRAMUSCULAR | Status: AC
  Filled 2024-09-23: qty 2

## 2024-09-23 MED ORDER — OXYCODONE HCL 5 MG PO TABS: 10.0000 mg | ORAL_TABLET | ORAL | Status: DC | PRN

## 2024-09-23 MED ORDER — MIDAZOLAM HCL (PF) 2 MG/2ML IJ SOLN
INTRAMUSCULAR | Status: AC | PRN
  Administered 2024-09-23 (×4): 1 mg via INTRAVENOUS

## 2024-09-23 MED ORDER — SODIUM CHLORIDE 0.9 % IV SOLN: INTRAVENOUS | Status: DC

## 2024-09-23 MED ORDER — FENTANYL CITRATE (PF) 100 MCG/2ML IJ SOLN
INTRAMUSCULAR | Status: AC | PRN
  Administered 2024-09-23 (×2): 50 ug via INTRAVENOUS

## 2024-09-23 NOTE — Procedures (Signed)
 Vascular and Interventional Radiology Procedure Note  Patient: [name] DOB: Jan 21, 1941 Medical Record Number: [id number] Note Date/Time: 09/23/24 11:43 AM   Performing Physician: Thom Hall, MD Assistant(s): None  Diagnosis: small liver lesion. Hx met melanoma . Planned MWA  Procedure: LIVER MASS BIOPSY and FIDUCIAL MARKER PLACEMENT  Anesthesia: Conscious Sedation Complications: None Estimated Blood Loss: Minimal Specimens: Sent for Pathology  Findings:  Successful Ultrasound and CT-guided R posterior liver mass Bx and fiducial marker placement. A total of 4 markers were placed. Hemostasis of the tract was achieved using Manual Pressure.  Plan: Bed rest for 2 hours.  See detailed procedure note with images in PACS. The patient tolerated the procedure well without incident or complication and was returned to Recovery in stable condition.    Thom Hall, MD [facility] Radiology   Pager. [phone] Clinic. [phone]

## 2024-09-23 NOTE — Discharge Instructions (Signed)
Please call Interventional Radiology clinic [phone] with any questions or concerns.  You may remove your dressing and shower tomorrow.  After the procedure, it is common to have: Soreness Bruising Mild pain You may also feel tired for a few days  Follow these instructions at home:  Medication: Do not use Aspirin or ibuprofen products, such as Advil or Motrin, as it may increase bleeding.  You may resume your usual medications as ordered by your doctor If your doctor prescribed antibiotics, take them as directed. Do not stop taking them just because you feel better. You need to take the full course of antibiotics.  Eating and drinking: Drink plenty of liquids to keep your urine pale yellow You can resume your regular diet as directed by your doctor  Care of the procedure site Follow instructions from your doctor about how to take care of your cut from surgery (incisions). Make sure you: Wash your hands with soap and water for at least 20 seconds before and after you change your bandage. If you cannot use soap and water, use hand sanitizer Change your bandage Leave stitches or skin glue in place for at least two weeks Leave tape strips alone unless you are told to take them off. You may trim the edges of the tape strips if they curl up. Check your incision every day for signs of infection. Check for: Redness, swelling, or more pain Fluid or blood Warmth Pus or a bad smell  Activity Rest at home for 1-2 days, or as told by your doctor Get up to take short walks every 1 to 2 hours. Ask for help if you feel weak or unsteady Do not lift anything that is heavier than 10 lb (4.5 kg), or the limit that you are told Do not play contact sports for 2 weeks after the procedure Return to your normal activities as told by your doctor Do not take baths, swim, or use a hot tub until your health care provider approves. Take showers only. Keep all follow-up visits as told by your  doctor  Contact a doctor if: You have more bleeding in your incision Your incision swells, or is red and more painful You have fluid that comes from your incision You develop a rash You have fever or chills  Get help right away if: You have swelling, bloating, or pain in your belly (abdomen) You get dizzy or faint You vomit or you feel like vomiting You have trouble breathing or feel short of breath You have chest pain You have problems talking or seeing You have trouble with your balance or moving your arms or legs  Moderate Conscious Sedation-Care After  This sheet gives you information about how to care for yourself after your procedure. Your health care provider may also give you more specific instructions. If you have problems or questions, contact your health care provider.  After the procedure, it is common to have: Sleepiness for several hours. Impaired judgment for several hours. Difficulty with balance. Vomiting if you eat too soon.  Follow these instructions at home:  Rest. Do not participate in activities where you could fall or become injured. Do not drive or use machinery. Do not drink alcohol. Do not take sleeping pills or medicines that cause drowsiness. Do not make important decisions or sign legal documents. Do not take care of children on your own.  Eating and drinking Follow the diet recommended by your health care provider. Drink enough fluid to keep your urine pale yellow.  If you vomit: Drink water, juice, or soup when you can drink without vomiting. Make sure you have little or no nausea before eating solid foods.  General instructions Take over-the-counter and prescription medicines only as told by your health care provider. Have a responsible adult stay with you for the time you are told. It is important to have someone help care for you until you are awake and alert. Do not smoke. Keep all follow-up visits as told by your health care  provider. This is important.  Contact a health care provider if: You are still sleepy or having trouble with balance after 24 hours. You feel light-headed. You keep feeling nauseous or you keep vomiting. You develop a rash. You have a fever. You have redness or swelling around the IV site.  Get help right away if: You have trouble breathing. You have new-onset confusion at home.  This information is not intended to replace advice given to you by your health care provider. Make sure you discuss any questions you have with your healthcare provider.

## 2024-09-23 NOTE — Consult Note (Signed)
 "     Chief Complaint: Patient was seen in consultation today for metastatic melanoma, liver mass  Referring Physician(s): Dr. Pauletta [name]  Supervising Physician: Hughes Simmonds  Patient Status: [facility] - Out-pt  History of Present Illness: Nicholas Bird is a 84 y.o. male with past medical history of a fib on Eliquis  with known metastatic melanoma originally from the L temple to the lung, now with liver lesion by CT.  He was recently seen in consultation with Dr. Hughes 08/24/24 for evaluation and management of his solitary liver lesion.  Prior to proceeding with ablation therapies, a biopsy with fiducial marker placement has been recommended.    Patient presents to [facility] Radiology for procedure today in his usual state of health.   He has been NPO.  He has appropriately held his blood thinners.   He does have post-procedure transportation and care arranged with his wife and daughter.   He is FULL CODE for procedural purposes today.   Past Medical History:  Diagnosis Date   Anticoagulant long-term use    Atrial fibrillation, permanent (HCC)    CARIOLOGIST-  DR FERNANDE   Bladder diverticulum    BPH (benign prostatic hypertrophy)    ED (erectile dysfunction) of organic origin    Elevated LFTs    CHRONIC   Gall stones    Gout    per pt 12-09-2013  gout is stable   Hematuria    History of pancreatitis    10/2012--  resolved    History of TIA (transient ischemic attack)    04/2009--  no residual   Hyperlipemia    Hypertension    Liver disease    Melanoma (HCC)    Personal history of colonic polyps 07/12/2008   TUBULAR ADENOMA   Rosacea     Past Surgical History:  Procedure Laterality Date   CARDIAC CATHETERIZATION  09-19-1998   false positive stress test---  normal cath and preserved lvf   CATARACT EXTRACTION W/ INTRAOCULAR LENS  IMPLANT, BILATERAL Bilateral 2008   CYSTOSCOPY WITH BIOPSY N/A 12/14/2013   Procedure: CYSTOSCOPY WITH BIOPSY;  Surgeon: Garnette Shack, MD;   Location: [facility];  Service: Urology;  Laterality: N/A;   INGUINAL HERNIA REPAIR Left 01-14-2001   IR RADIOLOGIST EVAL & MGMT  08/24/2024   LAPAROSCOPIC CHOLECYSTECTOMY  10-21-2008   AND LIVER BX'S   LAPAROSCOPIC INGUINAL HERNIA REPAIR Bilateral 05-22-2009    Allergies: Pertussis vaccines  Medications: Prior to Admission medications  Medication Sig Start Date End Date Taking? Authorizing Provider  allopurinol  (ZYLOPRIM ) 300 MG tablet TAKE 1 TABLET BY MOUTH EVERY DAY 08/24/24   [name], MD  amLODipine  (NORVASC ) 2.5 MG tablet Take 1 tablet (2.5 mg total) by mouth daily. 07/22/24   [name], MD  apixaban  (ELIQUIS ) 5 MG TABS tablet Take 1 tablet (5 mg total) by mouth 2 (two) times daily. 06/17/24   Almetta Donnice LABOR, MD  azelastine  (ASTELIN ) 0.1 % nasal spray Place 2 sprays into both nostrils 2 (two) times daily. 07/22/24   [name], MD  ferrous sulfate 325 (65 FE) MG tablet Take 325 mg by mouth daily.    [provider]  finasteride (PROSCAR) 5 MG tablet Take 5 mg by mouth. 10/21/14   [provider]  furosemide  (LASIX ) 40 MG tablet Take 1 tablet (40 mg total) by mouth every other day as needed. 07/22/24   [name], MD  metoprolol  succinate (TOPROL -XL) 50 MG 24 hr tablet TAKE  1 TABLET BY MOUTH EVERY DAY 06/15/24   Aniceto Jarvis L, NP  predniSONE  (DELTASONE ) 5 MG tablet TAKE 1 TABLET BY MOUTH 3 TIMES A WEEK. 09/09/24   Tina [name], MD  tamsulosin  (FLOMAX ) 0.4 MG CAPS capsule Take 1 capsule (0.4 mg total) by mouth daily. 07/04/23   [name], MD  valsartan  (DIOVAN ) 80 MG tablet TAKE 1 TABLET BY MOUTH EVERY DAY 06/03/24   Lesia Ozell Barter, PA-C     Family History  Problem Relation Age of Onset   Lung cancer Mother    Skin cancer Sister    Colon cancer Neg Hx    Esophageal cancer Neg Hx    Pancreatic cancer Neg Hx    Stomach cancer Neg Hx    Rectal cancer Neg Hx     Social History   Socioeconomic History    Marital status: Married    Spouse name: Not on file   Number of children: 2   Years of education: Not on file   Highest education level: Bachelor's degree (e.g., BA, AB, BS)  Occupational History   Occupation: retired    Associate Professor: RETIRED  Tobacco Use   Smoking status: Never   Smokeless tobacco: Never  Vaping Use   Vaping status: Never Used  Substance and Sexual Activity   Alcohol use: Yes    Alcohol/week: 14.0 standard drinks of alcohol    Types: 14 Shots of liquor per week    Comment: 3 oz. per day   Drug use: No   Sexual activity: Not Currently    Partners: Female  Other Topics Concern   Not on file  Social History Narrative   Not on file   Social Drivers of Health   Tobacco Use: Low Risk (09/23/2024)   Patient History    Smoking Tobacco Use: Never    Smokeless Tobacco Use: Never    Passive Exposure: Not on file  Financial Resource Strain: Low Risk (07/21/2024)   Overall Financial Resource Strain (CARDIA)    Difficulty of Paying Living Expenses: Not hard at all  Food Insecurity: No Food Insecurity (07/21/2024)   Epic    Worried About Programme Researcher, Broadcasting/film/video in the Last Year: Never true    Ran Out of Food in the Last Year: Never true  Transportation Needs: No Transportation Needs (07/21/2024)   Epic    Lack of Transportation (Medical): No    Lack of Transportation (Non-Medical): No  Physical Activity: Sufficiently Active (07/21/2024)   Exercise Vital Sign    Days of Exercise per Week: 5 days    Minutes of Exercise per Session: 30 min  Stress: Stress Concern Present (07/21/2024)   [employer] of Occupational Health - Occupational Stress Questionnaire    Feeling of Stress: To some extent  Social Connections: Unknown (07/21/2024)   Social Connection and Isolation Panel    Frequency of Communication with Friends and Family: More than three times a week    Frequency of Social Gatherings with Friends and Family: Three times a week    Attends Religious Services:  More than 4 times per year    Active Member of Clubs or Organizations: Yes    Attends Banker Meetings: More than 4 times per year    Marital Status: Not on file  Depression (PHQ2-9): Low Risk (09/04/2024)   Depression (PHQ2-9)    PHQ-2 Score: 0  Alcohol Screen: Low Risk (07/21/2024)   Alcohol Screen    Last Alcohol Screening Score (AUDIT): 7  Housing: Low Risk (07/21/2024)   Epic    Unable to Pay for Housing in the Last Year: No    Number of Times Moved in the Last Year: 0    Homeless in the Last Year: No  Utilities: Not At Risk (05/27/2024)   Epic    Threatened with loss of utilities: No  Health Literacy: Adequate Health Literacy (12/09/2023)   B1300 Health Literacy    Frequency of need for help with medical instructions: Never     Review of Systems: A 12 point ROS discussed and pertinent positives are indicated in the HPI above.  All other systems are negative.  Review of Systems  Constitutional:  Negative for fatigue and fever.  Respiratory:  Negative for cough and shortness of breath.   Cardiovascular:  Negative for chest pain.  Gastrointestinal:  Negative for abdominal pain, nausea and vomiting.  Musculoskeletal:  Negative for back pain.  Psychiatric/Behavioral:  Negative for behavioral problems and confusion.     Vital Signs: There were no vitals taken for this visit.  Physical Exam Vitals and nursing note reviewed.  Constitutional:      Appearance: Normal appearance.  HENT:     Mouth/Throat:     Mouth: Mucous membranes are moist.     Pharynx: Oropharynx is clear.  Cardiovascular:     Rate and Rhythm: Normal rate. Rhythm irregular.     Comments: Chronic a fib Abdominal:     General: Abdomen is flat.     Palpations: Abdomen is soft.  Skin:    General: Skin is warm and dry.  Neurological:     General: No focal deficit present.     Mental Status: He is alert and oriented to person, place, and time. Mental status is at baseline.  Psychiatric:         Mood and Affect: Mood normal.        Behavior: Behavior normal.        Thought Content: Thought content normal.        Judgment: Judgment normal.      MD Evaluation Airway: WNL Heart: WNL Abdomen: WNL Chest/ Lungs: WNL ASA  Classification: 3 Mallampati/Airway Score: Two   Imaging: MR ABDOMEN WWO CONTRAST Result Date: 09/10/2024 CLINICAL DATA:  Liver mass, right lobe. EXAM: MRI ABDOMEN WITHOUT AND WITH CONTRAST TECHNIQUE: Multiplanar multisequence MR imaging of the abdomen was performed both before and after the administration of intravenous contrast. CONTRAST:  8mL GADAVIST  GADOBUTROL  1 MMOL/ML IV SOLN COMPARISON:  Nuclear medicine PET scan from 08/03/2024 and MRI abdomen from 11/12/2006. FINDINGS: Lower chest: Unremarkable MR appearance to the lung bases. No pleural effusion. No pericardial effusion. Moderately enlarged heart size. Hepatobiliary: The liver is normal in size. Noncirrhotic configuration. There is a well-circumscribed 1.0 x 1.3 cm mildly T2 hyperintense lesion in the right hepatic lobe, segment 7, subcapsular location which exhibits diffusion restriction on high B value images. The postcontrast images are markedly motion degraded however, there is heterogeneous enhancement in this region on arterial phase images (series 11, image 33). Other postcontrast sequences are nondiagnostic. Overall, the lesion is technically incompletely characterized however, considering increased radiotracer uptake on the prior PET from 08/03/2024 and limited morphology on today's exam overall favor metastatic disease. No other focal liver lesions seen. No intrahepatic or extrahepatic bile duct dilatation. No choledocholithiasis. Unremarkable gallbladder. Pancreas: Mild-to-moderate diffuse atrophy of pancreas. No focal lesion. No peripancreatic fat stranding. There is dilation of main pancreatic duct which measures up to 7-8 mm at the level of  neck. There is susceptibility artifact in the pancreatic head,  which corresponds to multiple calcifications within the main pancreatic duct in the head region. There are also multiple dystrophic calcifications seen on the prior PET-CT scan. No tracer avid lesion was seen on the prior PET-CT scan. Findings favor sequela of chronic pancreatitis. Spleen:  Within normal limits in size and appearance. No focal mass. Adrenals/Urinary Tract: Unremarkable adrenal glands. No hydroureteronephrosis. No suspicious renal mass. Stomach/Bowel: Visualized portions within the abdomen are unremarkable. No disproportionate dilation of bowel loops. Vascular/Lymphatic: No pathologically enlarged lymph nodes identified. No abdominal aortic aneurysm demonstrated. No ascites. Other:  None. Musculoskeletal: No suspicious bone lesions identified. IMPRESSION: 1. There is a 1.0 x 1.3 cm lesion in the right hepatic lobe, segment 7, which is incompletely characterized on today's exam however, considering increased radiotracer uptake on the prior PET-CT scan and limited morphology on today's exam, overall favor metastatic disease. 2. Findings favor sequela of chronic pancreatitis. 3. Multiple other nonacute observations, as described above. Electronically Signed   By: Ree Molt M.D.   On: 09/10/2024 18:28   IR Radiologist Eval & Mgmt Result Date: 08/24/2024 EXAM: NEW PATIENT OFFICE VISIT CHIEF COMPLAINT: See below HISTORY OF PRESENT ILLNESS: See below REVIEW OF SYSTEMS: See below PHYSICAL EXAMINATION: See below ASSESSMENT AND PLAN: Please refer to completed note in the electronic medical record on [place] Epic Thom Hall, MD [facility] Electronically Signed   By: Thom Hall M.D.   On: 08/24/2024 12:45    Labs:  CBC: Recent Labs    08/21/24 1228 09/04/24 0851 09/18/24 1054 09/23/24 0930  WBC 7.3 6.6 6.9 5.6  HGB 13.6 13.6 14.0 13.3  HCT 38.7* 39.0 39.9 39.3  PLT 197 178 208 202    COAGS: No results for input(s):  INR, APTT in the last 8760 hours.  BMP: Recent Labs    08/07/24 0854 08/21/24 1228 09/04/24 0851 09/18/24 1054  NA 131* 135 132* 133*  K 3.7 4.1 4.4 4.0  CL 97* 96* 97* 97*  CO2 23 27 23 25   GLUCOSE 192* 200* 150* 171*  BUN 15 18 15 18   CALCIUM  9.5 9.6 9.4 9.7  CREATININE 1.15 1.29* 1.25* 1.13  GFRNONAA >60 55* 57* >60    LIVER FUNCTION TESTS: Recent Labs    08/07/24 0854 08/21/24 1228 09/04/24 0851 09/18/24 1054  BILITOT 1.7* 1.0 0.9 0.8  AST 22 21 29 25   ALT 13 13 19 17   ALKPHOS 70 71 73 73  PROT 7.1 7.2 7.2 7.5  ALBUMIN 4.3 4.5 4.5 4.6    TUMOR MARKERS: Recent Labs    12/12/23 1151  CEA 3.20    Assessment and Plan: Patient with past medical history of metastatic melanoma presents with complaint recent liver mass by MR.  IR consulted for locoregional therapies at the request of Dr. Tina.  Patient was seen in consultation by Dr. Hall 08/24/24 and recommended for liver mass biopsy with fiducial marker placement.  Patient has presented for this today.  Patient presents today in their usual state of health.  He has been NPO and is not currently on blood thinners.   MELDNa 8 INR 1.0   Risks and benefits of biopsy, fiducial marker placement was discussed with the patient and/or patient's family including, but not limited to bleeding, infection, damage to adjacent structures or low yield requiring additional tests.  All of the questions were answered and there is agreement to proceed.  Consent signed and  in chart.   Thank you for this interesting consult.  I greatly enjoyed meeting Nicholas Bird and look forward to participating in their care.  A copy of this report was sent to the requesting provider on this date.  Electronically Signed: Octavia Velador Sue-Ellen Nishat Livingston, PA 09/23/2024, 11:00 AM   I spent a total of  30 Minutes  in face to face in clinical consultation, greater than 50% of which was counseling/coordinating care for liver mass.   "

## 2024-09-24 ENCOUNTER — Other Ambulatory Visit: Payer: Self-pay

## 2024-09-24 DIAGNOSIS — Z01818 Encounter for other preprocedural examination: Secondary | ICD-10-CM

## 2024-09-24 LAB — SURGICAL PATHOLOGY

## 2024-09-24 MED ORDER — PIPERACILLIN SOD-TAZOBACTAM SO 2.25 (2-0.25) G IV SOLR: 3.3750 g | INTRAVENOUS | Status: AC

## 2024-09-24 NOTE — Patient Instructions (Signed)
 SURGICAL WAITING ROOM VISITATION Patients having surgery or a procedure may have no more than 2 support people in the waiting area - these visitors may rotate in the visitor waiting room.   Due to an increase in RSV and influenza rates and associated hospitalizations, children ages 71 and under may not visit patients in [facility] hospitals. If the patient needs to stay at the hospital during part of their recovery, the visitor guidelines for inpatient rooms apply.  PRE-OP VISITATION  Pre-op nurse will coordinate an appropriate time for 1 support person to accompany the patient in pre-op.  This support person may not rotate.  This visitor will be contacted when the time is appropriate for the visitor to come back in the pre-op area.  Please refer to the [facility] website for the visitor guidelines for Inpatients (after your surgery is over and you are in a regular room).  You are not required to quarantine at this time prior to your surgery. However, you must do this: Hand Hygiene often Do NOT share personal items Notify your provider if you are in close contact with someone who has COVID or you develop fever 100.4 or greater, new onset of sneezing, cough, sore throat, shortness of breath or body aches.  If you test positive for Covid or have been in contact with anyone that has tested positive in the last 10 days please notify you surgeon.    Your procedure is scheduled on: 09/30/24   Report to [facility] Main Entrance: [place] entrance where the [employer] is available.   Report to admitting at: 6:15 AM  Call this number if you have any questions or problems the morning of surgery [phone]  FOLLOW ANY ADDITIONAL PRE OP INSTRUCTIONS YOU RECEIVED FROM YOUR SURGEON'S OFFICE!!!  Do not eat food or drink fluids after Midnight the night prior to your surgery/procedure.   Oral Hygiene is also important to reduce your risk of infection.        Remember - BRUSH YOUR TEETH  THE MORNING OF SURGERY WITH YOUR REGULAR TOOTHPASTE  Do NOT smoke after Midnight the night before surgery.  STOP TAKING all Vitamins, Herbs and supplements 1 week before your surgery.   Take ONLY these medicines the morning of surgery with A SIP OF WATER : metoprolol ,amlodipine ,allopurinol ,finasteride,tamsulosin .                 You may not have any metal on your body including hair pins, jewelry, and body piercing  Do not wear lotions, powders, perfumes / cologne, or deodorant  Men may shave face and neck.  Contacts, Hearing Aids, dentures or bridgework may not be worn into surgery. DENTURES WILL BE REMOVED PRIOR TO SURGERY PLEASE DO NOT APPLY Poly grip OR ADHESIVES!!!  You may bring a small overnight bag with you on the day of surgery, only pack items that are not valuable. [facility] IS NOT RESPONSIBLE   FOR VALUABLES THAT ARE LOST OR STOLEN.   Patients discharged on the day of surgery will not be allowed to drive home.  Someone NEEDS to stay with you for the first 24 hours after anesthesia.  Do not bring your home medications to the hospital. The Pharmacy will dispense medications listed on your medication list to you during your admission in the Hospital.  Special Instructions: Bring a copy of your healthcare power of attorney and living will documents the day of surgery, if you wish to have them scanned into your [facility] Medical Records- EPIC  Please read over the following fact sheets you were given: IF YOU HAVE QUESTIONS ABOUT YOUR PRE-OP INSTRUCTIONS, PLEASE CALL [phone]   [facility] Health - Preparing for Surgery      Before surgery, you can play an important role.  Because skin is not sterile, your skin needs to be as free of germs as possible.  You can reduce the number of germs on your skin by washing with CHG (chlorahexidine gluconate) soap before surgery.  CHG is an antiseptic cleaner which kills germs and bonds with the skin to continue killing germs even after  washing. Please DO NOT use if you have an allergy to CHG or antibacterial soaps.  If your skin becomes reddened/irritated stop using the CHG and inform your nurse when you arrive at Short Stay. Do not shave (including legs and underarms) for at least 48 hours prior to the first CHG shower.  You may shave your face/neck.  Please follow these instructions carefully:  1.  Shower with CHG Soap the night before surgery ONLY (DO NOT USE THE SOAP THE MORNING OF SURGERY).  2.  If you choose to wash your hair, wash your hair first as usual with your normal  shampoo.  3.  After you shampoo, rinse your hair and body thoroughly to remove the shampoo.                             4.  Use CHG as you would any other liquid soap.  You can apply chg directly to the skin and wash.  Gently with a scrungie or clean washcloth.  5.  Apply the CHG Soap to your body ONLY FROM THE NECK DOWN.   Do not use on face/ open                           Wound or open sores. Avoid contact with eyes, ears mouth and genitals (private parts).                       Wash face,  Genitals (private parts) with your normal soap.             6.  Wash thoroughly, paying special attention to the area where your  surgery  will be performed.  7.  Thoroughly rinse your body with warm water  from the neck down.  8.  DO NOT shower/wash with your normal soap after using and rinsing off the CHG Soap.                9.  Pat yourself dry with a clean towel.            10.  Wear clean pajamas.            11.  Place clean sheets on your bed the night of your first shower and do not  sleep with pets.  Day of Surgery : Do not apply any CHG, lotions/deodorants the morning of surgery.  Please wear clean clothes to the hospital/surgery center.   FAILURE TO FOLLOW THESE INSTRUCTIONS MAY RESULT IN THE CANCELLATION OF YOUR SURGERY  PATIENT SIGNATURE_________________________________  NURSE  SIGNATURE__________________________________  ________________________________________________________________________

## 2024-09-25 ENCOUNTER — Encounter (HOSPITAL_COMMUNITY)
Admission: RE | Admit: 2024-09-25 | Discharge: 2024-09-25 | Disposition: A | Source: Ambulatory Visit | Attending: Interventional Radiology

## 2024-09-25 ENCOUNTER — Other Ambulatory Visit: Payer: Self-pay

## 2024-09-25 ENCOUNTER — Encounter (HOSPITAL_COMMUNITY): Payer: Self-pay

## 2024-09-25 VITALS — BP 134/88 | HR 85 | Temp 98.4°F | Ht 71.0 in | Wt 181.0 lb

## 2024-09-25 DIAGNOSIS — I10 Essential (primary) hypertension: Secondary | ICD-10-CM | POA: Diagnosis not present

## 2024-09-25 DIAGNOSIS — R160 Hepatomegaly, not elsewhere classified: Secondary | ICD-10-CM | POA: Insufficient documentation

## 2024-09-25 DIAGNOSIS — R7989 Other specified abnormal findings of blood chemistry: Secondary | ICD-10-CM

## 2024-09-25 DIAGNOSIS — Z01812 Encounter for preprocedural laboratory examination: Secondary | ICD-10-CM | POA: Insufficient documentation

## 2024-09-25 DIAGNOSIS — Z01818 Encounter for other preprocedural examination: Secondary | ICD-10-CM

## 2024-09-25 HISTORY — DX: Fatty (change of) liver, not elsewhere classified: K76.0

## 2024-09-25 HISTORY — DX: Anemia, unspecified: D64.9

## 2024-09-25 HISTORY — DX: Unspecified osteoarthritis, unspecified site: M19.90

## 2024-09-25 HISTORY — DX: Cardiac arrhythmia, unspecified: I49.9

## 2024-09-25 LAB — CBC WITH DIFFERENTIAL/PLATELET
Abs Immature Granulocytes: 0.03 K/uL (ref 0.00–0.07)
Basophils Absolute: 0 K/uL (ref 0.0–0.1)
Basophils Relative: 1 %
Eosinophils Absolute: 0.1 K/uL (ref 0.0–0.5)
Eosinophils Relative: 1 %
HCT: 42.3 % (ref 39.0–52.0)
Hemoglobin: 14.3 g/dL (ref 13.0–17.0)
Immature Granulocytes: 0 %
Lymphocytes Relative: 9 %
Lymphs Abs: 0.7 K/uL (ref 0.7–4.0)
MCH: 36.2 pg — ABNORMAL HIGH (ref 26.0–34.0)
MCHC: 33.8 g/dL (ref 30.0–36.0)
MCV: 107.1 fL — ABNORMAL HIGH (ref 80.0–100.0)
Monocytes Absolute: 0.8 K/uL (ref 0.1–1.0)
Monocytes Relative: 10 %
Neutro Abs: 6.3 K/uL (ref 1.7–7.7)
Neutrophils Relative %: 79 %
Platelets: 229 K/uL (ref 150–400)
RBC: 3.95 MIL/uL — ABNORMAL LOW (ref 4.22–5.81)
RDW: 14.9 % (ref 11.5–15.5)
WBC: 7.9 K/uL (ref 4.0–10.5)
nRBC: 0 % (ref 0.0–0.2)

## 2024-09-25 LAB — COMPREHENSIVE METABOLIC PANEL WITH GFR
ALT: 23 U/L (ref 0–44)
AST: 38 U/L (ref 15–41)
Albumin: 4.4 g/dL (ref 3.5–5.0)
Alkaline Phosphatase: 83 U/L (ref 38–126)
Anion gap: 15 (ref 5–15)
BUN: 17 mg/dL (ref 8–23)
CO2: 24 mmol/L (ref 22–32)
Calcium: 10.1 mg/dL (ref 8.9–10.3)
Chloride: 95 mmol/L — ABNORMAL LOW (ref 98–111)
Creatinine, Ser: 1.14 mg/dL (ref 0.61–1.24)
GFR, Estimated: >60 mL/min
Glucose, Bld: 168 mg/dL — ABNORMAL HIGH (ref 70–99)
Potassium: 4.1 mmol/L (ref 3.5–5.1)
Sodium: 134 mmol/L — ABNORMAL LOW (ref 135–145)
Total Bilirubin: 0.8 mg/dL (ref 0.0–1.2)
Total Protein: 7.3 g/dL (ref 6.5–8.1)

## 2024-09-25 LAB — PROTIME-INR
INR: 1 (ref 0.8–1.2)
Prothrombin Time: 14.3 s (ref 11.4–15.2)

## 2024-09-25 NOTE — Progress Notes (Signed)
 For Anesthesia: PCP - [name], MD  Cardiologist - NONE. Clearance: Rana Lum CROME, NP : 09/18/24 Bowel Prep reminder: N/A  Chest x-ray -  EKG - 05/20/24 Stress Test -  ECHO - 03/16/21 Cardiac Cath - 2000 Pacemaker/ICD device last checked: Pacemaker orders received: Device Rep notified:  Spinal Cord Stimulator:N/A  Sleep Study - N/A CPAP -   Fasting Blood Sugar - N/A Checks Blood Sugar _____ times a day Date and result of last Hgb A1c-  Last dose of GLP1 agonist- N/A GLP1 instructions: Hold 7 days prior to schedule (Hold 24 hours-daily)   Last dose of SGLT-2 inhibitors- N/A SGLT-2 instructions: Hold 72 hours prior to surgery  Blood Thinner Instructions: Eliquis  will be hold after: 09/27/24 Last Dose: Time last taken:  Aspirin Instructions:N/A Last Dose: Time last taken:  Activity level: Can go up a flight of stairs and activities of daily living without stopping and without chest pain and/or shortness of breath   Able to exercise without chest pain and/or shortness of breath  Anesthesia review: Hx: HTN,Afib,TIA's.  Patient denies shortness of breath, fever, cough and chest pain at PAT appointment   Patient verbalized understanding of instructions that were reviewed over the telephone.

## 2024-09-28 ENCOUNTER — Encounter (HOSPITAL_COMMUNITY): Payer: Self-pay

## 2024-09-28 NOTE — Anesthesia Preprocedure Evaluation (Signed)
 "                                  Anesthesia Evaluation  Patient identified by MRN, date of birth, ID band Patient awake    Reviewed: Allergy & Precautions, NPO status , Patient's Chart, lab work & pertinent test results  History of Anesthesia Complications Negative for: history of anesthetic complications  Airway Mallampati: II  TM Distance: >3 FB Neck ROM: Full    Dental no notable dental hx. (+) Teeth Intact   Pulmonary neg pulmonary ROS, neg sleep apnea, neg COPD, Patient abstained from smoking.Not current smoker   Pulmonary exam normal breath sounds clear to auscultation       Cardiovascular Exercise Tolerance: Good METShypertension, Pt. on medications (-) CAD and (-) Past MI + dysrhythmias Atrial Fibrillation  Rhythm:Irregular Rate:Normal - Systolic murmurs    Neuro/Psych negative neurological ROS  negative psych ROS   GI/Hepatic ,GERD  ,,(+)     substance abuse  alcohol useTwo glasses wine daily   Endo/Other  neg diabetes    Renal/GU negative Renal ROS     Musculoskeletal  (+) Arthritis ,    Abdominal   Peds  Hematology   Anesthesia Other Findings  Rudolfo Brandow III is an 84 yo male with PMH of HTN, A.fib on Eliquis , hx of TIA, hx of stage 4 melanoma with mets to brain, lung s/p right upper lobectomy (06/2023), possible bone mets, arthritis, liver mass   Patient follows with Cardiology for permanent a.fib on Eliquis . Last seen in EP clinic on 05/20/24. Noted to be doing well. Tolerating OAC. On Toprol  for rate control. He cardiac clearance tele visit on 09/18/24. Cleared as low risk:   Preoperative Cardiovascular Risk Assessment: According to the Revised Cardiac Risk Index (RCRI), his Perioperative Risk of Major Cardiac Event is (%): 0.9. His Functional Capacity in METs is: 5.81 according to the Duke Activity Status Index (DASI). Therefore, based on ACC/AHA guidelines, patient would be at acceptable risk for the planned procedure without  further cardiovascular testing. I will route this recommendation to the requesting party via Epic fax function.   Follows with Oncology for metastatic melanoma first diagnosed in 2016. Last seen on 1/16 by Dr. Tina. Noted to be clinically stable. One Prednisone  three times a week. On immunotherapy.       LD Eliquis : 1/25   Reproductive/Obstetrics                              Anesthesia Physical Anesthesia Plan  ASA: 3  Anesthesia Plan: General   Post-op Pain Management: Minimal or no pain anticipated   Induction: Intravenous  PONV Risk Score and Plan: 2 and Ondansetron  and Dexamethasone   Airway Management Planned: Oral ETT and Video Laryngoscope Planned  Additional Equipment: None  Intra-op Plan:   Post-operative Plan: Extubation in OR  Informed Consent: I have reviewed the patients History and Physical, chart, labs and discussed the procedure including the risks, benefits and alternatives for the proposed anesthesia with the patient or authorized representative who has indicated his/her understanding and acceptance.     Dental advisory given  Plan Discussed with: CRNA and Surgeon  Anesthesia Plan Comments: (Discussed risks of anesthesia with patient, including PONV, sore throat, lip/dental/eye damage. Rare risks discussed as well, such as cardiorespiratory and neurological sequelae, and allergic reactions. Discussed the role of CRNA in patient's perioperative  Anesthesia Evaluation    Airway        Dental   Pulmonary           Cardiovascular hypertension,      Neuro/Psych    GI/Hepatic   Endo/Other    Renal/GU      Musculoskeletal   Abdominal   Peds  Hematology   Anesthesia Other Findings   Reproductive/Obstetrics                              Anesthesia Physical Anesthesia Plan  ASA:   Anesthesia Plan:    Post-op Pain Management:    Induction:   PONV Risk Score and Plan:   Airway Management Planned:   Additional Equipment:   Intra-op Plan:   Post-operative Plan:   Informed Consent:   Plan Discussed with:   Anesthesia Plan Comments: (See PAT note from 1/23  )         Anesthesia Quick Evaluation  "

## 2024-09-28 NOTE — Progress Notes (Addendum)
 " Case: [id number] Date/Time: 09/30/24 0815   Procedure: CT WITH ANESTHESIA - Liver Mass   Anesthesia type: General   Pre-op diagnosis: liver mass   [facility]: [facility]   Surgeons: Radiologist, Medication, MD       DISCUSSION: Nicholas Bird is an 84 yo male with PMH of HTN, A.fib on Eliquis , hx of TIA, hx of stage 4 melanoma with mets to brain, lung s/p right upper lobectomy (06/2023), possible bone mets, arthritis, liver mass  Patient follows with Cardiology for permanent a.fib on Eliquis . Last seen in EP clinic on 05/20/24. Noted to be doing well. Tolerating OAC. On Toprol  for rate control. He cardiac clearance tele visit on 09/18/24. Cleared as low risk:  Preoperative Cardiovascular Risk Assessment: According to the Revised Cardiac Risk Index (RCRI), his Perioperative Risk of Major Cardiac Event is (%): 0.9. His Functional Capacity in METs is: 5.81 according to the Duke Activity Status Index (DASI). Therefore, based on ACC/AHA guidelines, patient would be at acceptable risk for the planned procedure without further cardiovascular testing. I will route this recommendation to the requesting party via Epic fax function.  Follows with Oncology for metastatic melanoma first diagnosed in 2016. Last seen on 1/16 by Dr. Tina. Noted to be clinically stable. One Prednisone  three times a week. On immunotherapy.    LD Eliquis : 1/25  VS: BP 134/88   Pulse 85   Temp 36.9 C (Oral)   Ht 5' 11 (1.803 m)   Wt 82.1 kg   SpO2 98%   BMI 25.24 kg/m   PROVIDERS: [name], MD   LABS: Labs reviewed: Acceptable for surgery. (all labs ordered are listed, but only abnormal results are displayed)  Labs Reviewed  COMPREHENSIVE METABOLIC PANEL WITH GFR - Abnormal; Notable for the following components:      Result Value   Sodium 134 (*)    Chloride 95 (*)    Glucose, Bld 168 (*)    All other components within normal limits  CBC WITH DIFFERENTIAL/PLATELET - Abnormal; Notable for the  following components:   RBC 3.95 (*)    MCV 107.1 (*)    MCH 36.2 (*)    All other components within normal limits  PROTIME-INR  TYPE AND SCREEN     EKG 05/20/24:  Afib rate 62  Echo 03/16/2021:  IMPRESSIONS    1. Left ventricular ejection fraction, by estimation, is 60 to 65%. The left ventricle has normal function. The left ventricle has no regional wall motion abnormalities. Left ventricular diastolic parameters were normal.  2. Right ventricular systolic function is normal. The right ventricular size is mildly enlarged. There is normal pulmonary artery systolic pressure. The estimated right ventricular systolic pressure is 32.4 mmHg.  3. Left atrial size was severely dilated.  4. Right atrial size was mild to moderately dilated.  5. The mitral valve is normal in structure. Mild mitral valve regurgitation. No evidence of mitral stenosis.  6. The aortic valve is calcified. Aortic valve regurgitation is mild. Mild to moderate aortic valve sclerosis/calcification is present, without any evidence of aortic stenosis. Aortic regurgitation PHT measures 466 msec.  7. Aortic dilatation noted. There is borderline dilatation of the aortic root, measuring 37 mm.  8. The inferior vena cava is normal in size with greater than 50% respiratory variability, suggesting right atrial pressure of 3 mmHg. Past Medical History:  Diagnosis Date   Anemia    Anticoagulant long-term use    Arthritis    Atrial fibrillation,  permanent (HCC)    CARIOLOGIST-  DR FERNANDE   Bladder diverticulum    BPH (benign prostatic hypertrophy)    Dysrhythmia    ED (erectile dysfunction) of organic origin    Elevated LFTs    CHRONIC   Fatty liver    Gall stones    Gout    per pt 12-09-2013  gout is stable   Hematuria    History of pancreatitis    10/2012--  resolved    History of TIA (transient ischemic attack)    04/2009--  no residual   Hyperlipemia    Hypertension    Liver disease    Melanoma (HCC)     Personal history of colonic polyps 07/12/2008   TUBULAR ADENOMA   Rosacea     Past Surgical History:  Procedure Laterality Date   CARDIAC CATHETERIZATION  09/19/1998   false positive stress test---  normal cath and preserved lvf   CATARACT EXTRACTION W/ INTRAOCULAR LENS  IMPLANT, BILATERAL Bilateral 09/03/2006   CYSTOSCOPY WITH BIOPSY N/A 12/14/2013   Procedure: CYSTOSCOPY WITH BIOPSY;  Surgeon: Garnette Shack, MD;  Location: [facility];  Service: Urology;  Laterality: N/A;   INGUINAL HERNIA REPAIR Left 01/14/2001   IR RADIOLOGIST EVAL & MGMT  08/24/2024   LAPAROSCOPIC CHOLECYSTECTOMY  10/21/2008   AND LIVER BX'S   LAPAROSCOPIC INGUINAL HERNIA REPAIR Bilateral 05/22/2009   TONSILLECTOMY      MEDICATIONS:  allopurinol  (ZYLOPRIM ) 300 MG tablet   amLODipine  (NORVASC ) 2.5 MG tablet   apixaban  (ELIQUIS ) 5 MG TABS tablet   ferrous sulfate 325 (65 FE) MG tablet   finasteride (PROSCAR) 5 MG tablet   furosemide  (LASIX ) 40 MG tablet   metoprolol  succinate (TOPROL -XL) 50 MG 24 hr tablet   predniSONE  (DELTASONE ) 5 MG tablet   tamsulosin  (FLOMAX ) 0.4 MG CAPS capsule   valsartan  (DIOVAN ) 80 MG tablet   vitamin B-12 (CYANOCOBALAMIN ) 500 MCG tablet   No current facility-administered medications for this encounter.   [name] Odis DEVONNA [facility] Short Stay/Anesthesiology [facility] Phone [phone] 09/28/2024 4:03 PM        "

## 2024-09-29 ENCOUNTER — Other Ambulatory Visit: Payer: Self-pay | Admitting: Radiology

## 2024-09-29 ENCOUNTER — Other Ambulatory Visit: Payer: Self-pay

## 2024-09-29 DIAGNOSIS — C439 Malignant melanoma of skin, unspecified: Secondary | ICD-10-CM

## 2024-09-29 NOTE — Progress Notes (Signed)
 MANUAL NAVARRA III                                          MRN: [id number]   09/29/2024   The [facility] Quality Team Specialist reviewed this patient medical record for the purposes of chart review for care gap closure. The following were reviewed: abstraction for care gap closure-controlling blood pressure.    [facility] Quality Team

## 2024-09-29 NOTE — Progress Notes (Signed)
 Treatment today adjusted due to patient need for procedure.

## 2024-09-29 NOTE — H&P (Incomplete)
 "  Chief Complaint: Metastatic melanoma to lung/liver; referred for image guided microwave ablation segment 7 liver tumor (metastatic melanoma)  Referring Provider(s): Chang,R  Supervising Physician: Hughes Simmonds  Patient Status: [facility] - Out-pt  History of Present Illness: Nicholas Bird is an 84 y.o. male with PMH sig for afib on eliquis , BPH, gout, TIA, hep C, HTN,HLD, colon polyps, rosacea, and metastatic L temple melanoma, initially s/p local immunoRX (T-VEC) then WLE (05/2022) and followed by VATS RUL for pulmonary metastasis (06/2023) - all perfomed at [facility]. He has a hypermetabolic segment 7 liver lesion which was biopsied and underwent placement of fiducial marker on 09/23/24. Pathology revealed metastatic melanoma. He has undergone recent consultation with Dr. Hughes to discuss treatment options for his metastatic liver disease and was deemed an appropriate candidate for image guided MWA of the segment 7 liver met. He presents today for the procedure.   *** Patient is Full Code  Past Medical History:  Diagnosis Date   Anemia    Anticoagulant long-term use    Arthritis    Atrial fibrillation, permanent (HCC)    CARIOLOGIST-  DR FERNANDE   Bladder diverticulum    BPH (benign prostatic hypertrophy)    Dysrhythmia    ED (erectile dysfunction) of organic origin    Elevated LFTs    CHRONIC   Fatty liver    Gall stones    Gout    per pt 12-09-2013  gout is stable   Hematuria    History of pancreatitis    10/2012--  resolved    History of TIA (transient ischemic attack)    04/2009--  no residual   Hyperlipemia    Hypertension    Liver disease    Melanoma (HCC)    Personal history of colonic polyps 07/12/2008   TUBULAR ADENOMA   Rosacea     Past Surgical History:  Procedure Laterality Date   CARDIAC CATHETERIZATION  09/19/1998   false positive stress test---  normal cath and preserved lvf   CATARACT EXTRACTION W/ INTRAOCULAR LENS  IMPLANT, BILATERAL Bilateral 09/03/2006    CYSTOSCOPY WITH BIOPSY N/A 12/14/2013   Procedure: CYSTOSCOPY WITH BIOPSY;  Surgeon: Garnette Shack, MD;  Location: [facility];  Service: Urology;  Laterality: N/A;   INGUINAL HERNIA REPAIR Left 01/14/2001   IR RADIOLOGIST EVAL & MGMT  08/24/2024   LAPAROSCOPIC CHOLECYSTECTOMY  10/21/2008   AND LIVER BX'S   LAPAROSCOPIC INGUINAL HERNIA REPAIR Bilateral 05/22/2009   TONSILLECTOMY      Allergies: Pertussis vaccines  Medications: Prior to Admission medications  Medication Sig Start Date End Date Taking? Authorizing Provider  allopurinol  (ZYLOPRIM ) 300 MG tablet TAKE 1 TABLET BY MOUTH EVERY DAY 08/24/24  Yes [name], MD  amLODipine  (NORVASC ) 2.5 MG tablet Take 1 tablet (2.5 mg total) by mouth daily. 07/22/24  Yes [name], MD  apixaban  (ELIQUIS ) 5 MG TABS tablet Take 1 tablet (5 mg total) by mouth 2 (two) times daily. 06/17/24  Yes Almetta Donnice LABOR, MD  ferrous sulfate 325 (65 FE) MG tablet Take 325 mg by mouth in the morning and at bedtime.   Yes [provider]  finasteride (PROSCAR) 5 MG tablet Take 5 mg by mouth daily. 10/21/14  Yes [provider]  furosemide  (LASIX ) 40 MG tablet Take 1 tablet (40 mg total) by mouth every other day as needed. Patient taking differently: Take 40 mg by mouth every other day. 07/22/24  Yes [name], MD  metoprolol  succinate (  TOPROL -XL) 50 MG 24 hr tablet TAKE 1 TABLET BY MOUTH EVERY DAY 06/15/24  Yes Ollis, Brandi L, NP  predniSONE  (DELTASONE ) 5 MG tablet TAKE 1 TABLET BY MOUTH 3 TIMES A WEEK. Patient taking differently: Take 5 mg by mouth every Monday, Wednesday, and Friday. 09/09/24  Yes Tina [name], MD  tamsulosin  (FLOMAX ) 0.4 MG CAPS capsule Take 1 capsule (0.4 mg total) by mouth daily. 07/04/23  Yes [name], MD  valsartan  (DIOVAN ) 80 MG tablet TAKE 1 TABLET BY MOUTH EVERY DAY 06/03/24  Yes Lesia Ozell Barter, PA-C  vitamin B-12 (CYANOCOBALAMIN ) 500 MCG tablet Take 500 mcg by  mouth daily.   Yes [provider]     Family History  Problem Relation Age of Onset   Lung cancer Mother    Skin cancer Sister    Colon cancer Neg Hx    Esophageal cancer Neg Hx    Pancreatic cancer Neg Hx    Stomach cancer Neg Hx    Rectal cancer Neg Hx     Social History   Socioeconomic History   Marital status: Married    Spouse name: Not on file   Number of children: 2   Years of education: Not on file   Highest education level: Bachelor's degree (e.g., BA, AB, BS)  Occupational History   Occupation: retired    Associate Professor: RETIRED  Tobacco Use   Smoking status: Never   Smokeless tobacco: Never  Vaping Use   Vaping status: Never Used  Substance and Sexual Activity   Alcohol use: Not Currently    Alcohol/week: 2.0 standard drinks of alcohol    Types: 2 Cans of beer per week    Comment: 2 glasses. per day   Drug use: No   Sexual activity: Not Currently    Partners: Female  Other Topics Concern   Not on file  Social History Narrative   Not on file   Social Drivers of Health   Tobacco Use: Low Risk (09/28/2024)   Patient History    Smoking Tobacco Use: Never    Smokeless Tobacco Use: Never    Passive Exposure: Not on file  Financial Resource Strain: Low Risk (07/21/2024)   Overall Financial Resource Strain (CARDIA)    Difficulty of Paying Living Expenses: Not hard at all  Food Insecurity: No Food Insecurity (07/21/2024)   Epic    Worried About Programme Researcher, Broadcasting/film/video in the Last Year: Never true    Ran Out of Food in the Last Year: Never true  Transportation Needs: No Transportation Needs (07/21/2024)   Epic    Lack of Transportation (Medical): No    Lack of Transportation (Non-Medical): No  Physical Activity: Sufficiently Active (07/21/2024)   Exercise Vital Sign    Days of Exercise per Week: 5 days    Minutes of Exercise per Session: 30 min  Stress: Stress Concern Present (07/21/2024)   [employer] of Occupational Health - Occupational  Stress Questionnaire    Feeling of Stress: To some extent  Social Connections: Unknown (07/21/2024)   Social Connection and Isolation Panel    Frequency of Communication with Friends and Family: More than three times a week    Frequency of Social Gatherings with Friends and Family: Three times a week    Attends Religious Services: More than 4 times per year    Active Member of Clubs or Organizations: Yes    Attends Banker Meetings: More than 4 times per year    Marital  Status: Not on file  Depression (PHQ2-9): Low Risk (09/04/2024)   Depression (PHQ2-9)    PHQ-2 Score: 0  Alcohol Screen: Low Risk (07/21/2024)   Alcohol Screen    Last Alcohol Screening Score (AUDIT): 7  Housing: Low Risk (07/21/2024)   Epic    Unable to Pay for Housing in the Last Year: No    Number of Times Moved in the Last Year: 0    Homeless in the Last Year: No  Utilities: Not At Risk (05/27/2024)   Epic    Threatened with loss of utilities: No  Health Literacy: Adequate Health Literacy (12/09/2023)   B1300 Health Literacy    Frequency of need for help with medical instructions: Never       Review of Systems  Vital Signs:   Advance Care Plan: no documents on file  Physical Exam  Imaging: CT LIVER MASS BIOPSY Result Date: 09/24/2024 INDICATION: CT Liver mass BX AND fiducial marker placement (will need US  from IR) Briefly, 84 year old male with a history of metastatic melanoma with small, hypermetabolic RIGHT posterior hepatic lobe mass. EXAM: Procedures; ULTRASOUND and CT-GUIDED LIVER MASS BIOPSY and FIDUCIAL MARKER PLACEMENT COMPARISON:  PET-CT, 08/03/2024.  MR abdomen, 09/10/2024. MEDICATIONS: None. ANESTHESIA/SEDATION: Moderate (conscious) sedation was employed during this procedure. A total of Versed  4 mg and Fentanyl  100 mcg was administered intravenously. Moderate Sedation Time: 57 minutes. The patient's level of consciousness and vital signs were monitored continuously by radiology  nursing throughout the procedure under my direct supervision. CONTRAST:  None. FLUOROSCOPY: CT dose; 946 mGycm COMPLICATIONS: None immediate. PROCEDURE: RADIATION DOSE REDUCTION: This exam was performed according to the departmental dose-optimization program which includes automated exposure control, adjustment of the mA and/or kV according to patient size and/or use of iterative reconstruction technique. Informed consent was obtained from the patient following an explanation of the procedure, risks, benefits and alternatives. A time out was performed prior to the initiation of the procedure. The patient was positioned RIGHT-side-down decubitus on the CT table and a limited CT was performed for procedural planning demonstrating an indistinct, small posterosuperior RIGHT hepatic lobe mass. Sonographic evaluation of the mass were also performed, with the acquired images sent to PACS. The procedure was planned. The operative site was prepped and draped in the usual sterile fashion. Appropriate trajectory was confirmed with a 22 gauge spinal needle after the adjacent tissues were anesthetized with 1% lidocaine . LIVER MASS BIOPSY; Under intermittent CT guidance, a 17 gauge coaxial needle was advanced into the peripheral aspect of the mass. Appropriate positioning was confirmed and 3 samples were obtained with an 18 gauge core needle biopsy device. FIDUCIAL MARKER PLACEMENT; Under direct sonographic guidance, and using intermittent CT scanning, a 15-cm, 21-G access needle was inserted towards the mass lesion under CT guidance. The tip of the needle was initially positioned proximal and superior to the tumor. At this position, a [employer] 2 mm x 5 mm coil as a fiducial marker was successfully placed. The needle was repositioned and additional markers were placed anterior and inferior to the mass. A total of four (4) fiducial markers were identified and confirmed properly positioned on the completion CT images. The  needle was removed and superficial hemostasis was obtained by manual compression. A limited postprocedural CT was negative for hemorrhage or additional complication. A dressing was placed. The patient tolerated the procedure well without immediate postprocedural complication. FINDINGS: *Small, indistinct superior RIGHT posterior hepatic lobe (segment VII) mass measuring approximately 1.0 cm. *Biopsy and fiducial marker placement, with  a total of four (4) markers were deployed, located superomedial, anterior-superior, medial and posterolateral to the lesion. IMPRESSION: Successful CT-guided, ultrasound-assisted liver mass biopsy and fiducial marker placement, positioned as above. PLAN: The patient will return to Vascular Interventional Radiology (VIR) for planned percutaneous ablation of the small RIGHT liver mass. Thom Hall, MD [facility] Electronically Signed   By: Thom Hall M.D.   On: 09/24/2024 07:12   MR ABDOMEN WWO CONTRAST Result Date: 09/10/2024 CLINICAL DATA:  Liver mass, right lobe. EXAM: MRI ABDOMEN WITHOUT AND WITH CONTRAST TECHNIQUE: Multiplanar multisequence MR imaging of the abdomen was performed both before and after the administration of intravenous contrast. CONTRAST:  8mL GADAVIST  GADOBUTROL  1 MMOL/ML IV SOLN COMPARISON:  Nuclear medicine PET scan from 08/03/2024 and MRI abdomen from 11/12/2006. FINDINGS: Lower chest: Unremarkable MR appearance to the lung bases. No pleural effusion. No pericardial effusion. Moderately enlarged heart size. Hepatobiliary: The liver is normal in size. Noncirrhotic configuration. There is a well-circumscribed 1.0 x 1.3 cm mildly T2 hyperintense lesion in the right hepatic lobe, segment 7, subcapsular location which exhibits diffusion restriction on high B value images. The postcontrast images are markedly motion degraded however, there is heterogeneous enhancement in this region on arterial phase  images (series 11, image 33). Other postcontrast sequences are nondiagnostic. Overall, the lesion is technically incompletely characterized however, considering increased radiotracer uptake on the prior PET from 08/03/2024 and limited morphology on today's exam overall favor metastatic disease. No other focal liver lesions seen. No intrahepatic or extrahepatic bile duct dilatation. No choledocholithiasis. Unremarkable gallbladder. Pancreas: Mild-to-moderate diffuse atrophy of pancreas. No focal lesion. No peripancreatic fat stranding. There is dilation of main pancreatic duct which measures up to 7-8 mm at the level of neck. There is susceptibility artifact in the pancreatic head, which corresponds to multiple calcifications within the main pancreatic duct in the head region. There are also multiple dystrophic calcifications seen on the prior PET-CT scan. No tracer avid lesion was seen on the prior PET-CT scan. Findings favor sequela of chronic pancreatitis. Spleen:  Within normal limits in size and appearance. No focal mass. Adrenals/Urinary Tract: Unremarkable adrenal glands. No hydroureteronephrosis. No suspicious renal mass. Stomach/Bowel: Visualized portions within the abdomen are unremarkable. No disproportionate dilation of bowel loops. Vascular/Lymphatic: No pathologically enlarged lymph nodes identified. No abdominal aortic aneurysm demonstrated. No ascites. Other:  None. Musculoskeletal: No suspicious bone lesions identified. IMPRESSION: 1. There is a 1.0 x 1.3 cm lesion in the right hepatic lobe, segment 7, which is incompletely characterized on today's exam however, considering increased radiotracer uptake on the prior PET-CT scan and limited morphology on today's exam, overall favor metastatic disease. 2. Findings favor sequela of chronic pancreatitis. 3. Multiple other nonacute observations, as described above. Electronically Signed   By: Ree Molt M.D.   On: 09/10/2024 18:28     Labs:  CBC: Recent Labs    09/04/24 0851 09/18/24 1054 09/23/24 0930 09/25/24 0900  WBC 6.6 6.9 5.6 7.9  HGB 13.6 14.0 13.3 14.3  HCT 39.0 39.9 39.3 42.3  PLT 178 208 202 229    COAGS: Recent Labs    09/23/24 0930 09/25/24 0900  INR 1.0 1.0    BMP: Recent Labs    08/21/24 1228 09/04/24 0851 09/18/24 1054 09/25/24 0918  NA 135 132* 133* 134*  K 4.1 4.4 4.0 4.1  CL 96* 97* 97* 95*  CO2 27 23 25 24   GLUCOSE 200* 150* 171* 168*  BUN 18 15 18  17  CALCIUM  9.6 9.4 9.7 10.1  CREATININE 1.29* 1.25* 1.13 1.14  GFRNONAA 55* 57* >60 >60    LIVER FUNCTION TESTS: Recent Labs    08/21/24 1228 09/04/24 0851 09/18/24 1054 09/25/24 0918  BILITOT 1.0 0.9 0.8 0.8  AST 21 29 25  38  ALT 13 19 17 23   ALKPHOS 71 73 73 83  PROT 7.2 7.2 7.5 7.3  ALBUMIN 4.5 4.5 4.6 4.4    TUMOR MARKERS: Recent Labs    12/12/23 1151  CEA 3.20    Assessment and Plan: 84 y.o. male with PMH sig for afib on eliquis , BPH, hep C, gout, TIA, HTN,HLD, colon polyps, rosacea, and metastatic L temple melanoma, initially s/p local immunoRX (T-VEC) then WLE (05/2022) and followed by VATS RUL for pulmonary metastasis (06/2023) - all perfomed at [facility]. He has a hypermetabolic segment 7 liver lesion which was biopsied and underwent placement of fiducial marker on 09/23/24. Pathology revealed metastatic melanoma. He has undergone recent consultation with Dr. Hughes to discuss treatment options for his metastatic liver disease and was deemed an appropriate candidate for image guided MWA of the segment 7 liver met. He presents today for the procedure.Risks and benefits of procedure was discussed with the patient and/or patient's family including, but not limited to bleeding, infection, damage to adjacent structures, anesthesia related complications or low yield requiring additional tests.  All of the questions were answered and there is agreement to proceed.  Consent signed and in chart.  This procedure  involves the use of CT and because of the nature of the planned procedure, it is possible that we will have prolonged use of CT.   Potential radiation risks to you include (but are not limited to) the following: - A slightly elevated risk for cancer  several years later in life. This risk is typically less than 0.5% percent. This risk is low in comparison to the normal incidence of human cancer, which is 33% for women and 50% for men according to the American [employer] Society. - Radiation induced injury can include skin redness, resembling a rash, tissue breakdown / ulcers and hair loss (which can be temporary or permanent).   The likelihood of either of these occurring depends on the difficulty of the procedure and whether you are sensitive to radiation due to previous procedures, disease, or genetic conditions.   IF your procedure requires a prolonged use of radiation, you will be notified and given written instructions for further action.  It is your responsibility to monitor the irradiated area for the 2 weeks following the procedure and to notify your physician if you are concerned that you have suffered a radiation induced injury.     Thank you for allowing our service to participate in DURWOOD DITTUS Bird 's care.  Electronically Signed: D. Franky Rakers, PA-C   09/29/2024, 12:51 PM      I spent a total of  25 minutes   in face to face in clinical consultation, greater than 50% of which was counseling/coordinating care for image guided microwave ablation of liver tumor (metastatic melanoma)i   "

## 2024-09-30 ENCOUNTER — Other Ambulatory Visit (HOSPITAL_COMMUNITY): Payer: Self-pay

## 2024-09-30 ENCOUNTER — Ambulatory Visit (HOSPITAL_COMMUNITY)

## 2024-09-30 ENCOUNTER — Ambulatory Visit (HOSPITAL_COMMUNITY)
Admission: RE | Admit: 2024-09-30 | Discharge: 2024-09-30 | Disposition: A | Discharge status: Home / Self Care | Attending: Interventional Radiology | Admitting: Interventional Radiology

## 2024-09-30 ENCOUNTER — Encounter (HOSPITAL_COMMUNITY)
Admission: RE | Disposition: A | Discharge status: Home / Self Care | Payer: Self-pay | Source: Home / Self Care | Attending: Interventional Radiology

## 2024-09-30 ENCOUNTER — Encounter (HOSPITAL_COMMUNITY): Payer: Self-pay

## 2024-09-30 ENCOUNTER — Encounter (HOSPITAL_COMMUNITY): Payer: Self-pay | Admitting: Medical

## 2024-09-30 ENCOUNTER — Other Ambulatory Visit: Payer: Self-pay

## 2024-09-30 ENCOUNTER — Ambulatory Visit (HOSPITAL_BASED_OUTPATIENT_CLINIC_OR_DEPARTMENT_OTHER): Payer: Self-pay | Admitting: Certified Registered"

## 2024-09-30 DIAGNOSIS — I10 Essential (primary) hypertension: Secondary | ICD-10-CM

## 2024-09-30 DIAGNOSIS — K7689 Other specified diseases of liver: Secondary | ICD-10-CM

## 2024-09-30 DIAGNOSIS — I4821 Permanent atrial fibrillation: Secondary | ICD-10-CM | POA: Diagnosis not present

## 2024-09-30 DIAGNOSIS — C787 Secondary malignant neoplasm of liver and intrahepatic bile duct: Secondary | ICD-10-CM | POA: Insufficient documentation

## 2024-09-30 DIAGNOSIS — N400 Enlarged prostate without lower urinary tract symptoms: Secondary | ICD-10-CM | POA: Insufficient documentation

## 2024-09-30 DIAGNOSIS — I4891 Unspecified atrial fibrillation: Secondary | ICD-10-CM

## 2024-09-30 DIAGNOSIS — Z8673 Personal history of transient ischemic attack (TIA), and cerebral infarction without residual deficits: Secondary | ICD-10-CM | POA: Insufficient documentation

## 2024-09-30 DIAGNOSIS — E785 Hyperlipidemia, unspecified: Secondary | ICD-10-CM | POA: Insufficient documentation

## 2024-09-30 DIAGNOSIS — Z7901 Long term (current) use of anticoagulants: Secondary | ICD-10-CM | POA: Diagnosis not present

## 2024-09-30 DIAGNOSIS — R160 Hepatomegaly, not elsewhere classified: Secondary | ICD-10-CM

## 2024-09-30 DIAGNOSIS — C439 Malignant melanoma of skin, unspecified: Secondary | ICD-10-CM

## 2024-09-30 LAB — TYPE AND SCREEN
ABO/RH(D): O POS
Antibody Screen: NEGATIVE

## 2024-09-30 LAB — ABO/RH: ABO/RH(D): O POS

## 2024-09-30 MED ORDER — FENTANYL CITRATE (PF) 250 MCG/5ML IJ SOLN
INTRAMUSCULAR | Status: AC
  Filled 2024-09-30: qty 5

## 2024-09-30 MED ORDER — EPHEDRINE 5 MG/ML INJ
INTRAVENOUS | Status: AC
  Filled 2024-09-30: qty 5

## 2024-09-30 MED ORDER — PHENYLEPHRINE HCL (PRESSORS) 10 MG/ML IV SOLN
INTRAVENOUS | Status: AC
  Filled 2024-09-30: qty 1

## 2024-09-30 MED ORDER — PHENYLEPHRINE HCL-NACL 20-0.9 MG/250ML-% IV SOLN
INTRAVENOUS | Status: DC | PRN
  Administered 2024-09-30: 20 ug/min via INTRAVENOUS

## 2024-09-30 MED ORDER — BUPIVACAINE HCL (PF) 0.5 % IJ SOLN
INTRAMUSCULAR | Status: AC | PRN
  Administered 2024-09-30: 30 mL

## 2024-09-30 MED ORDER — EPHEDRINE SULFATE (PRESSORS) 25 MG/5ML IV SOSY
PREFILLED_SYRINGE | INTRAVENOUS | Status: DC | PRN
  Administered 2024-09-30 (×2): 10 mg via INTRAVENOUS
  Administered 2024-09-30: 5 mg via INTRAVENOUS
  Administered 2024-09-30: 10 mg via INTRAVENOUS
  Administered 2024-09-30 (×2): 5 mg via INTRAVENOUS

## 2024-09-30 MED ORDER — SODIUM CHLORIDE 0.9 % IV SOLN: INTRAVENOUS | Status: DC

## 2024-09-30 MED ORDER — ONDANSETRON HCL 4 MG/2ML IJ SOLN
INTRAMUSCULAR | Status: AC
  Filled 2024-09-30: qty 2

## 2024-09-30 MED ORDER — SOLIFENACIN SUCCINATE 5 MG PO TABS
5.0000 mg | ORAL_TABLET | Freq: Every day | ORAL | 0 refills | Status: DC
  Filled 2024-09-30: qty 7, 7d supply, fill #0

## 2024-09-30 MED ORDER — PHENYLEPHRINE 80 MCG/ML (10ML) SYRINGE FOR IV PUSH (FOR BLOOD PRESSURE SUPPORT)
PREFILLED_SYRINGE | INTRAVENOUS | Status: DC | PRN
  Administered 2024-09-30: 160 ug via INTRAVENOUS

## 2024-09-30 MED ORDER — ORAL CARE MOUTH RINSE: 15.0000 mL | Freq: Once | OROMUCOSAL | Status: AC

## 2024-09-30 MED ORDER — PROPOFOL 10 MG/ML IV BOLUS
INTRAVENOUS | Status: AC
  Filled 2024-09-30: qty 20

## 2024-09-30 MED ORDER — PHENAZOPYRIDINE HCL 100 MG PO TABS
100.0000 mg | ORAL_TABLET | Freq: Three times a day (TID) | ORAL | 0 refills | Status: DC | PRN
  Filled 2024-09-30: qty 21, 7d supply, fill #0

## 2024-09-30 MED ORDER — CIPROFLOXACIN HCL 500 MG PO TABS
500.0000 mg | ORAL_TABLET | Freq: Two times a day (BID) | ORAL | 0 refills | Status: DC
  Filled 2024-09-30 (×2): qty 14, 7d supply, fill #0

## 2024-09-30 MED ORDER — LIDOCAINE 1% INJECTION FOR CIRCUMCISION
INJECTION | INTRAVENOUS | Status: AC | PRN
  Administered 2024-09-30: 10 mL via SUBCUTANEOUS

## 2024-09-30 MED ORDER — ROCURONIUM BROMIDE 100 MG/10ML IV SOLN
INTRAVENOUS | Status: DC | PRN
  Administered 2024-09-30 (×2): 15 mg via INTRAVENOUS
  Administered 2024-09-30: 10 mg via INTRAVENOUS
  Administered 2024-09-30: 40 mg via INTRAVENOUS

## 2024-09-30 MED ORDER — PROPOFOL 10 MG/ML IV BOLUS
INTRAVENOUS | Status: DC | PRN
  Administered 2024-09-30: 120 mg via INTRAVENOUS

## 2024-09-30 MED ORDER — SUGAMMADEX SODIUM 200 MG/2ML IV SOLN
INTRAVENOUS | Status: AC
  Filled 2024-09-30: qty 2

## 2024-09-30 MED ORDER — OXYCODONE HCL 5 MG PO TABS: 10.0000 mg | ORAL_TABLET | ORAL | Status: DC | PRN

## 2024-09-30 MED ORDER — SUGAMMADEX SODIUM 200 MG/2ML IV SOLN
INTRAVENOUS | Status: DC | PRN
  Administered 2024-09-30: 300 mg via INTRAVENOUS

## 2024-09-30 MED ORDER — LIDOCAINE HCL (PF) 2 % IJ SOLN
INTRAMUSCULAR | Status: AC
  Filled 2024-09-30: qty 5

## 2024-09-30 MED ORDER — FENTANYL CITRATE (PF) 250 MCG/5ML IJ SOLN
INTRAMUSCULAR | Status: DC | PRN
  Administered 2024-09-30 (×2): 25 ug via INTRAVENOUS
  Administered 2024-09-30: 100 ug via INTRAVENOUS

## 2024-09-30 MED ORDER — ROCURONIUM BROMIDE 10 MG/ML (PF) SYRINGE
PREFILLED_SYRINGE | INTRAVENOUS | Status: AC
  Filled 2024-09-30: qty 10

## 2024-09-30 MED ORDER — SODIUM CHLORIDE 0.9 % IV SOLN
2.0000 g | Freq: Once | INTRAVENOUS | Status: AC
  Administered 2024-09-30: 2 g via INTRAVENOUS
  Filled 2024-09-30: qty 2

## 2024-09-30 MED ORDER — VASOPRESSIN 20 UNIT/ML IV SOLN
INTRAVENOUS | Status: AC
  Filled 2024-09-30: qty 1

## 2024-09-30 MED ORDER — LACTATED RINGERS IV SOLN: INTRAVENOUS | Status: DC

## 2024-09-30 MED ORDER — BUPIVACAINE HCL (PF) 0.5 % IJ SOLN
INTRAMUSCULAR | Status: AC
  Filled 2024-09-30: qty 30

## 2024-09-30 MED ORDER — CHLORHEXIDINE GLUCONATE 0.12 % MT SOLN
15.0000 mL | Freq: Once | OROMUCOSAL | Status: AC
  Administered 2024-09-30: 15 mL via OROMUCOSAL

## 2024-09-30 MED ORDER — DEXAMETHASONE SOD PHOSPHATE PF 10 MG/ML IJ SOLN
INTRAMUSCULAR | Status: AC
  Filled 2024-09-30: qty 1

## 2024-09-30 MED ORDER — IOHEXOL 300 MG/ML  SOLN
100.0000 mL | Freq: Once | INTRAMUSCULAR | Status: AC | PRN
  Administered 2024-09-30: 100 mL via INTRAVENOUS

## 2024-09-30 MED ORDER — DEXAMETHASONE SOD PHOSPHATE PF 10 MG/ML IJ SOLN
INTRAMUSCULAR | Status: DC | PRN
  Administered 2024-09-30: 5 mg via INTRAVENOUS

## 2024-09-30 MED ORDER — ONDANSETRON HCL 4 MG/2ML IJ SOLN
INTRAMUSCULAR | Status: DC | PRN
  Administered 2024-09-30: 4 mg via INTRAVENOUS

## 2024-09-30 NOTE — Discharge Summary (Signed)
 "  Patient ID: Nicholas Bird MRN: [id number] DOB/AGE: 1941-01-21 84 y.o.  Admit date: 09/30/2024 Discharge date: 09/30/2024  Supervising Physician: Hughes Simmonds  Patient Status: [facility] OP  Admission Diagnoses: Metastatic melanoma to lung/liver; referred for image guided microwave ablation segment 7 liver tumor (metastatic melanoma)   Discharge Diagnoses: Metastatic melanoma to liver, status post image guided microwave ablation of segment 7 liver tumor/metastatic melanoma via general anesthesia Past Medical History:  Diagnosis Date   Anemia    Anticoagulant long-term use    Arthritis    Atrial fibrillation, permanent (HCC)    CARIOLOGIST-  DR FERNANDE   Bladder diverticulum    BPH (benign prostatic hypertrophy)    Dysrhythmia    ED (erectile dysfunction) of organic origin    Elevated LFTs    CHRONIC   Fatty liver    Gall stones    Gout    per pt 12-09-2013  gout is stable   Hematuria    History of pancreatitis    10/2012--  resolved    History of TIA (transient ischemic attack)    04/2009--  no residual   Hyperlipemia    Hypertension    Liver disease    Melanoma (HCC)    Personal history of colonic polyps 07/12/2008   TUBULAR ADENOMA   Rosacea    Past Surgical History:  Procedure Laterality Date   CARDIAC CATHETERIZATION  09/19/1998   false positive stress test---  normal cath and preserved lvf   CATARACT EXTRACTION W/ INTRAOCULAR LENS  IMPLANT, BILATERAL Bilateral 09/03/2006   CYSTOSCOPY WITH BIOPSY N/A 12/14/2013   Procedure: CYSTOSCOPY WITH BIOPSY;  Surgeon: Garnette Shack, MD;  Location: [facility];  Service: Urology;  Laterality: N/A;   INGUINAL HERNIA REPAIR Left 01/14/2001   IR RADIOLOGIST EVAL & MGMT  08/24/2024   LAPAROSCOPIC CHOLECYSTECTOMY  10/21/2008   AND LIVER BX'S   LAPAROSCOPIC INGUINAL HERNIA REPAIR Bilateral 05/22/2009   TONSILLECTOMY       Discharged Condition: good  Hospital Course: Nicholas Bird is an 84 y.o. male with PMH sig  for afib on eliquis , BPH, gout, TIA, hep C, HTN,HLD, colon polyps, rosacea, and metastatic L temple melanoma, initially s/p local immunoRX (T-VEC) then WLE (05/2022) and followed by VATS RUL for pulmonary metastasis (06/2023) - all perfomed at [facility]. Nicholas Bird has a hypermetabolic segment 7 liver lesion which was biopsied and underwent placement of fiducial marker on 09/23/24. Pathology revealed metastatic melanoma. Nicholas Bird has undergone recent consultation with Dr. Hughes to discuss treatment options for his metastatic liver disease and was deemed an appropriate candidate for image guided MWA of the segment 7 liver met. Nicholas Bird underwent CT-guided microwave ablation of segment 7 liver tumor/metastatic melanoma on 09/30/2024 via general anesthesia.  The procedure was performed without immediate complications and the patient was subsequently extubated and observed in PACU for 3 hours postprocedure.  Patient was seen in PACU after observation period and was stable.  Nicholas Bird denied fever, headache, chest pain, dyspnea, cough, abdominal/back pain, nausea, vomiting or bleeding.  Nicholas Bird was able to tolerate his diet without difficulty.  Nicholas Bird currently in and out caths himself.  Above findings were discussed with Dr.  Hughes and patient was deemed stable for discharge home at this time.  Patient will follow-up with Dr. Hughes in IR clinic in 1 month.  Nicholas Bird may resume his home medications with exception of Eliquis  which Nicholas Bird can resume in 36 hours.  Patient will follow-up with Dr. Tina as scheduled.  Nicholas Bird will contact  our team with any additional questions or concerns.  Consults: anesthesia  Significant Diagnostic Studies:  Results for orders placed or performed during the hospital encounter of 09/30/24  ABO/Rh   Collection Time: 09/30/24  7:15 AM  Result Value Ref Range   ABO/RH(D)      O POS Performed at [facility], [address], [place], [place] [address]      Treatments: CT-guided microwave ablation of segment 7  liver lesion/metastatic melanoma via general anesthesia on 09/30/2024  Discharge Exam: Blood pressure (!) 146/97, pulse 70, temperature 97.6 F (36.4 C), resp. rate 20, height 5' 11 (1.803 m), weight 181 lb (82.1 kg), SpO2 100%. awake/alert; chest- CTA bilat; heart- nl rate, irreg rhythm; abd-soft,+BS,NT; no LE edema ; puncture site right flank  with intact, clean gauze bandage.  Site nontender to palpation.  Disposition: Discharge disposition: 01-Home or Self Care       Discharge Instructions     Call MD for:  difficulty breathing, headache or visual disturbances   Complete by: As directed    Call MD for:  extreme fatigue   Complete by: As directed    Call MD for:  hives   Complete by: As directed    Call MD for:  persistant dizziness or light-headedness   Complete by: As directed    Call MD for:  persistant nausea and vomiting   Complete by: As directed    Call MD for:  redness, tenderness, or signs of infection (pain, swelling, redness, odor or green/yellow discharge around incision site)   Complete by: As directed    Call MD for:  severe uncontrolled pain   Complete by: As directed    Call MD for:  temperature >100.4   Complete by: As directed    Change dressing (specify)   Complete by: As directed    May change bandage over right flank in 24 hours; may apply new bandage to site daily for the next 2 to 3 days.   Diet - low sodium heart healthy   Complete by: As directed    Discharge instructions   Complete by: As directed    May resume home medications, may resume Eliquis  in 36 hours; avoid strenuous activity for 1 week; stay well-hydrated   Driving Restrictions   Complete by: As directed    If you drive please do not do so for the next 24 hours   Increase activity slowly   Complete by: As directed    Lifting restrictions   Complete by: As directed    Avoid strenuous activity or heavy lifting for 1 week   May shower / Bathe   Complete by: As directed    May walk  up steps   Complete by: As directed       Allergies as of 09/30/2024       Reactions   Pertussis Vaccines Other (See Comments)   Arm swelling at age 70        Medication List     TAKE these medications    allopurinol  300 MG tablet Commonly known as: ZYLOPRIM  TAKE 1 TABLET BY MOUTH EVERY DAY   amLODipine  2.5 MG tablet Commonly known as: NORVASC  Take 1 tablet (2.5 mg total) by mouth daily.   apixaban  5 MG Tabs tablet Commonly known as: Eliquis  Take 1 tablet (5 mg total) by mouth 2 (two) times daily.   ciprofloxacin  500 MG tablet Commonly known as: Cipro  Take 1 tablet (500 mg total) by mouth 2 (  two) times daily.   ferrous sulfate 325 (65 FE) MG tablet Take 325 mg by mouth in the morning and at bedtime.   finasteride 5 MG tablet Commonly known as: PROSCAR Take 5 mg by mouth daily.   furosemide  40 MG tablet Commonly known as: LASIX  Take 1 tablet (40 mg total) by mouth every other day as needed. What changed: when to take this   metoprolol  succinate 50 MG 24 hr tablet Commonly known as: TOPROL -XL TAKE 1 TABLET BY MOUTH EVERY DAY   phenazopyridine  100 MG tablet Commonly known as: Pyridium  Take 1 tablet (100 mg total) by mouth 3 (three) times daily as needed.   predniSONE  5 MG tablet Commonly known as: DELTASONE  TAKE 1 TABLET BY MOUTH 3 TIMES A WEEK. What changed: See the new instructions.   solifenacin  5 MG tablet Commonly known as: VESICARE  Take 1 tablet (5 mg total) by mouth daily.   tamsulosin  0.4 MG Caps capsule Commonly known as: FLOMAX  Take 1 capsule (0.4 mg total) by mouth daily.   valsartan  80 MG tablet Commonly known as: DIOVAN  TAKE 1 TABLET BY MOUTH EVERY DAY   vitamin B-12 500 MCG tablet Commonly known as: CYANOCOBALAMIN  Take 500 mcg by mouth daily.               Discharge Care Instructions  (From admission, onward)           Start     Ordered   09/30/24 0000  Change dressing (specify)       Comments: May change bandage  over right flank in 24 hours; may apply new bandage to site daily for the next 2 to 3 days.   09/30/24 1521            Follow-up Information     Mugweru, Thom, MD Follow up.   Specialties: Interventional Radiology, Diagnostic Radiology, Radiology Why: Radiology team will contact you regarding follow-up with Dr. Hughes in 1 month; call [phone] or [phone] with any questions Contact information: [address] [place] [address] [phone]         Tina [name], MD Follow up.   Specialty: Oncology Why: Follow-up with Dr. Tina as scheduled Contact information: [address] [place] [address] [phone]                  Electronically Signed: D. Franky Rakers, PA-C 09/30/2024, 3:25 PM   I have spent Less Than 30 Minutes discharging Nicholas Bird.      "

## 2024-09-30 NOTE — Discharge Instructions (Addendum)
 May resume home medications, may resume Eliquis  in 36 hours, avoid strenuous activity /heavy lifting for 1 week, stay well-hydrated; call [phone] or [phone] with any questions

## 2024-09-30 NOTE — Anesthesia Procedure Notes (Signed)
 Procedure Name: Intubation Date/Time: 09/30/2024 9:07 AM  Performed by: Augusta Daved SAILOR, CRNAPre-anesthesia Checklist: Patient identified, Emergency Drugs available, Suction available and Patient being monitored Patient Re-evaluated:Patient Re-evaluated prior to induction Oxygen Delivery Method: Circle System Utilized Preoxygenation: Pre-oxygenation with 100% oxygen Induction Type: IV induction Ventilation: Oral airway inserted - appropriate to patient size and Two handed mask ventilation required Laryngoscope Size: Glidescope Grade View: Grade I Tube type: Oral Tube size: 7.5 mm Number of attempts: 1 Airway Equipment and Method: Stylet and Oral airway Placement Confirmation: ETT inserted through vocal cords under direct vision, positive ETCO2 and breath sounds checked- equal and bilateral Tube secured with: Tape Dental Injury: Teeth and Oropharynx as per pre-operative assessment

## 2024-09-30 NOTE — Addendum Note (Signed)
 Addendum  created 09/30/24 1406 by Augusta Daved SAILOR, CRNA   Clinical Note Signed

## 2024-09-30 NOTE — Transfer of Care (Addendum)
 Immediate Anesthesia Transfer of Care Note  Patient: Nicholas Bird  Procedure(s) Performed: CT WITH ANESTHESIA  Patient Location: PACU  Anesthesia Type:General  Level of Consciousness: awake, alert , oriented, and patient cooperative  Airway & Oxygen Therapy: Patient Spontanous Breathing and Patient connected to face mask oxygen  Post-op Assessment: Report given to RN and Post -op Vital signs reviewed and stable  Post vital signs: Reviewed and stable  Last Vitals:  Vitals Value Taken Time  BP 151/98 09/30/24 11:30  Temp 97.61F 09/30/24 11:35  Pulse 70 09/30/24 11:34  Resp 12 09/30/24 11:34  SpO2 100 % 09/30/24 11:34  Vitals shown include unfiled device data.  Last Pain:  Vitals:   09/30/24 0707  TempSrc:   PainSc: 0-No pain         Complications: No notable events documented.

## 2024-09-30 NOTE — Procedures (Signed)
 Vascular and Interventional Radiology Procedure Note  Patient: Nicholas Bird DOB: 14-Jul-1941 Medical Record Number: [id number] Note Date/Time: 09/30/24 9:39 AM   Performing Physician: Thom Hall, MD Assistant(s): None  Diagnosis: Hx melanoma w liver metastasis.    Procedure: PERCUTANEOUS MICROWAVE ABLATION of a RIGHT LIVER MASS   Anesthesia: General Anesthesia Complications: None Estimated Blood Loss: Minimal Specimens: Sent for None   Findings:  Successful CT-guided MWA of small, R hepatic lobe (seg VII) liver mass. Single probe ablation w 5 minutes prescribed burn time. Hemostasis of the tract was achieved using manual pressure.   Plan: Bed rest for 3 hours.   See detailed procedure note with images in PACS. The patient tolerated the procedure well without incident or complication and was returned to PACU in stable condition.    Thom Hall, MD [facility]   Pager. [phone] Clinic. [phone]

## 2024-09-30 NOTE — Progress Notes (Signed)
 Patient ID: Nicholas Bird, male   DOB: 01-24-1941, 84 y.o.   MRN: [id number] Electronic prescriptions for cipro , pyridium  and solifenacin  were sent to [facility] OP pharmacy for pt in error today(no refills for any). Orders since discontinued. Both pharmacy staff and pt were notified.

## 2024-09-30 NOTE — Anesthesia Postprocedure Evaluation (Signed)
"   Anesthesia Post Note  Patient: Nicholas Bird  Procedure(s) Performed: CT WITH ANESTHESIA     Patient location during evaluation: PACU Anesthesia Type: General Level of consciousness: awake and alert Pain management: pain level controlled Vital Signs Assessment: post-procedure vital signs reviewed and stable Respiratory status: spontaneous breathing, nonlabored ventilation, respiratory function stable and patient connected to nasal cannula oxygen Cardiovascular status: blood pressure returned to baseline and stable Postop Assessment: no apparent nausea or vomiting Anesthetic complications: no   No notable events documented.  Last Vitals:  Vitals:   09/30/24 1145 09/30/24 1200  BP: (!) 144/98 (!) 142/82  Pulse: 78 67  Resp: 17 13  Temp:    SpO2: 99% 99%    Last Pain:  Vitals:   09/30/24 1200  TempSrc:   PainSc: 0-No pain                 Rome Ade      "

## 2024-10-01 ENCOUNTER — Encounter (HOSPITAL_COMMUNITY): Payer: Self-pay | Admitting: Radiology

## 2024-10-02 ENCOUNTER — Inpatient Hospital Stay

## 2024-10-08 NOTE — Assessment & Plan Note (Signed)
 Continue immunotherapy with nivolumab  Monitor clinical signs of worsening immune related side effects Return in 2 weeks with labs visit and treatment

## 2024-10-08 NOTE — Assessment & Plan Note (Signed)
 Patient is on apixaban  for history of A-fib.   Will continue Prednisone  to 5 mg 3 times weekly. Vit D/Ca daily

## 2024-10-08 NOTE — Progress Notes (Unsigned)
 [facility] OFFICE PROGRESS NOTE  Patient Care Team: [name], MD as PCP - General (Family Medicine) [name], MD (Inactive) as PCP - Electrophysiology (Cardiology) [name], MD (Inactive) (Cardiology) [name], MD as Attending Physician (Urology) [name], MD as Consulting Physician (Dermatology) [name], MD as Consulting Physician (Ophthalmology) [name], MD as Consulting Physician (Gastroenterology) [name], [facility] as Pharmacist (Pharmacist) [name], RN as [facility] Care Management  84 y.o.man undergoing nivolumab  for stage IV melanoma, HTN, Afib on apixaban , GERD, gout, BPH, being follow up for metastatic melanoma on treatment.    Previous immune related side effects of diarrhea resolved. Previous polyarthritis resolved.  No recurrence.    Current Diagnosis: Metastatic melanoma with brain and lung metastases.  Suspicious for bone metastases. BRAF negative. 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 (Started 09/06/23) 08/19/23 SRS to 4 brain mets  He underwent CT-guided microwave ablation of segment 7 liver tumor/metastatic melanoma on 09/30/2024 via general anesthesia.  Assessment & Plan   No orders of the defined types were placed in this encounter.    [name], MD  INTERVAL HISTORY: Patient returns for follow-up.  Oncology History  Melanoma of scalp (HCC)  01/01/2015 Initial Diagnosis   Malignant melanoma (HCC)   12/2014 Surgery   April 2016 WLE and SLN 01/31/2015: Reexcision    02/25/2018 Imaging   CT CAP Subcentimeter right upper lobe subpleural and left upper lobe perifissural nodules, new from 01/24/2016, indeterminate. Recommend short interval follow-up (3 months), given histor    04/07/2019 Imaging   CT Chest: No evidence of metastatic disease within the chest. Left upper lobe perifissural nodule no longer evident. Right upper lobe subpleural  nodule no longer evident.   CT AP No evidence of metastatic disease.     04/2020 Pathology Results   Summer 2021: New scalp lesion, in transit  path: -Malignant melanoma, involving dermal tissue    04/12/2020 Imaging   CT chest: NED CT AP: NED    05/04/2020 Imaging   CT Neck: No mass or lymphadenopathy within the neck.    05/25/2020 - 07/25/2020 Chemotherapy   5 cycles of TVEC   01/24/2021 PET scan   PET whole body - No abnormal foci of increased radiotracer uptake to suggest metastatic disease.  -Small focus of increased uptake within the proximal sigmoid colon without adjacent stranding or definite wall thickening, indeterminant. Recommend further evaluation with colonoscopy if clinically    08/24/2021 PET scan   PET whole body - No abnormal foci of increased radiotracer uptake to suggest metastatic disease.   - Small focus of increased uptake within the proximal sigmoid colon, similar to 01/24/2021. Recommend clinical correlation and/or further evaluation with colonoscopy if clinically indicated    01/31/2022 Surgery   (Outside case, [phone], 1 slide, 01/23/2022) Skin, left inferior lateral anterior scalp, shave - Metastatic malignant melanoma Size: 2 mm Pattern: Pigmented epithelioid cells forming small plaque in superficial dermis and showing epidermotropism Tumor infiltrating lymphocytes: Brisk Nuclear grade: 3/3 Margins: Focally present adjacent to edge of tissue sections    02/21/2022 - 05/09/2022 Chemotherapy   had an intransit recurrence of melanoma and was retreated with TVEC.  Biopsy in October 2023 showed NED.    05/09/2022 PET scan   - Mildly avid 3 mm right upper lobe subcentimeter pulmonary nodule, possibly increased from 2 mm on prior; recommend attention on follow-up.   -No definite evidence of metabolically active disease.  05/28/2022 Pathology Results   Diagnosis   Skin, Left temple, excision - Dermal fibrosis with lymphocytic and granulomatous  inflammation consistent with site of recent therapy - No residual melanoma identified - Margins free of tumor      02/15/2023 PET scan   - Focal area of hypermetabolic activity is seen in the ascending colon, indeterminate. Recommend correlation with colonoscopy.   -Unchanged size of mildly hypermetabolic left paratracheal lymph node, indeterminate. Recommend attention on follow-up imaging.   -Pancreatic calcifications likely reflect sequela of chronic pancreatitis.   -Bladder diverticuli and enlarged prostate.    05/13/2023 PET scan   1.  Evidence of progressive disease as demonstrated by interval enlargement of right upper lobe pulmonary nodule most concerning for metastasis by melanoma. Additional single right pulmonary nodule and 2 sites of focal osseous avidity which are suspicious but indeterminate for additional sites of involvement by metastasis involving the T8 and L1 vertebral bodies.  2.  Focal uptake within the sigmoid colon which has increased in conspicuity over time. Recommend correlation with colonoscopy if not recently completed with differential diagnosis including adenoma, carcinoma, and inflammatory lesion (given associated coarse calcifications/high density material). No evidence of complication (i.e.  fluid collection, or free air) by low-dose unenhanced CT.  3.  Persistent FDG abnormality of the left anterior shin associated with a dermal nodule. Recommend direct inspection as additional site of cutaneous neoplasm is not excluded.    06/14/2023 Pathology Results   A: Lung, right upper lobe, lobectomy  - Metastatic melanoma, multifocal (largest focus 2.4 cm, with two sub-centimeter nodules also identified microscopically, each 3 mm) - Surgical margins negative  - BRAF (VE1): Negative - TILs: brisk - Two lymph nodes, negative for malignancy (0/2)   07/17/2023 Genetic Testing   [place]: pertinent negatives: BRAF. KIT TERT C.-124C>T variant- promoter mutation NRAS p.G13D  Missense variant (exon 2) - GOF TP53 p.R342P ARID1A p.G285fs NF1 p.R1362* PHLPP2 p.Q300* NF1 p.K191_R192delinsN*  Multiple VUS findings.   08/05/2023 Imaging   MRI brain  --Single enhancing right occipital cortical lesion is most concerning for intracranial metastatic disease.   --Indeterminate 7 mm enhancing lesion in the right posterior parietal bone. There is no definite FDG avidity in this region on the recent PET/CT, arguing against malignancy, however close attention to this area on follow-up imaging is recommended.    08/05/2023 Imaging   CT chest IMPRESSION:   -Right upper lobectomy with moderate sized loculated right pleural effusion. New 0.3 cm right lower lobe micronodule. Recommend attention on follow-up CT in 3 months.   Few subcentimeter right infra-axillary lymph nodes with adjacent fat stranding and benign morphology, new since prior. Recommendation follow-up CT.   Markedly enlarged left atrium and mild qualitative dilatation of right atrium. Extensive coronary artery calcifications.    09/06/2023 -  Chemotherapy   Patient is on Treatment Plan : METASTATIC MELANOMA Nivolumab  (240) q14d     09/19/2023 Cancer Staging   Staging form: Melanoma of the Skin, AJCC 7th Edition - Clinical: Stage IV (TX, N0, M1c) - Signed by Tina [name], MD on 09/19/2023 Biopsy of metastatic site performed: Yes Source of metastatic specimen: Lung   11/19/2023 Imaging   MRI brain: Positive treatment response, previously seen brain lesions are no longer detectable. No new lesion.   11/21/2023 PET scan   PET: after 6C of nivo every 2 weeks. Marked response to therapy, including resolution of hepatic and osseous hypermetabolism.      PHYSICAL EXAMINATION: ECOG PERFORMANCE STATUS: {CHL ONC ECOG ED:[id number]}  There were no vitals filed for this visit. There were no vitals filed for this visit.  GENERAL: alert, no distress and comfortable SKIN: skin color normal and no jaundice or  bruising or petechiae on exposed skin EYES: normal, sclera clear OROPHARYNX: no exudate  NECK: No palpable mass LYMPH:  no palpable cervical, axillary lymphadenopathy  LUNGS: clear to auscultation and no wheeze or rales with normal breathing effort HEART: regular rate & rhythm  ABDOMEN: abdomen soft, non-tender and nondistended. Musculoskeletal: no edema NEURO: no focal motor/sensory deficits  Relevant data reviewed during this visit included labs.  New labs ordered.

## 2024-10-09 ENCOUNTER — Inpatient Hospital Stay

## 2024-10-09 VITALS — BP 124/62 | HR 76 | Temp 97.5°F | Resp 16 | Ht 71.0 in | Wt 183.0 lb

## 2024-10-09 DIAGNOSIS — M130 Polyarthritis, unspecified: Secondary | ICD-10-CM

## 2024-10-09 DIAGNOSIS — C434 Malignant melanoma of scalp and neck: Secondary | ICD-10-CM

## 2024-10-09 DIAGNOSIS — Z7901 Long term (current) use of anticoagulants: Secondary | ICD-10-CM | POA: Insufficient documentation

## 2024-10-09 LAB — CBC WITH DIFFERENTIAL (CANCER CENTER ONLY)
Abs Immature Granulocytes: 0.03 10*3/uL (ref 0.00–0.07)
Basophils Absolute: 0.1 10*3/uL (ref 0.0–0.1)
Basophils Relative: 1 %
Eosinophils Absolute: 0.1 10*3/uL (ref 0.0–0.5)
Eosinophils Relative: 2 %
HCT: 39.2 % (ref 39.0–52.0)
Hemoglobin: 13.7 g/dL (ref 13.0–17.0)
Immature Granulocytes: 1 %
Lymphocytes Relative: 19 %
Lymphs Abs: 1.1 10*3/uL (ref 0.7–4.0)
MCH: 35.3 pg — ABNORMAL HIGH (ref 26.0–34.0)
MCHC: 34.9 g/dL (ref 30.0–36.0)
MCV: 101 fL — ABNORMAL HIGH (ref 80.0–100.0)
Monocytes Absolute: 0.6 10*3/uL (ref 0.1–1.0)
Monocytes Relative: 11 %
Neutro Abs: 3.7 10*3/uL (ref 1.7–7.7)
Neutrophils Relative %: 66 %
Platelet Count: 259 10*3/uL (ref 150–400)
RBC: 3.88 MIL/uL — ABNORMAL LOW (ref 4.22–5.81)
RDW: 14.1 % (ref 11.5–15.5)
WBC Count: 5.6 10*3/uL (ref 4.0–10.5)
nRBC: 0 % (ref 0.0–0.2)

## 2024-10-09 LAB — CMP (CANCER CENTER ONLY)
ALT: 24 U/L (ref 0–44)
AST: 25 U/L (ref 15–41)
Albumin: 4.6 g/dL (ref 3.5–5.0)
Alkaline Phosphatase: 87 U/L (ref 38–126)
Anion gap: 13 (ref 5–15)
BUN: 22 mg/dL (ref 8–23)
CO2: 25 mmol/L (ref 22–32)
Calcium: 9.6 mg/dL (ref 8.9–10.3)
Chloride: 95 mmol/L — ABNORMAL LOW (ref 98–111)
Creatinine: 1.28 mg/dL — ABNORMAL HIGH (ref 0.61–1.24)
GFR, Estimated: 56 mL/min — ABNORMAL LOW
Glucose, Bld: 183 mg/dL — ABNORMAL HIGH (ref 70–99)
Potassium: 3.9 mmol/L (ref 3.5–5.1)
Sodium: 133 mmol/L — ABNORMAL LOW (ref 135–145)
Total Bilirubin: 0.7 mg/dL (ref 0.0–1.2)
Total Protein: 7.3 g/dL (ref 6.5–8.1)

## 2024-10-09 LAB — T4, FREE: Free T4: 1.43 ng/dL (ref 0.80–2.00)

## 2024-10-09 LAB — TSH: TSH: 2.94 u[IU]/mL (ref 0.350–4.500)

## 2024-10-09 LAB — LACTATE DEHYDROGENASE: LDH: 163 U/L (ref 105–235)

## 2024-10-09 MED ORDER — SODIUM CHLORIDE 0.9 % IV SOLN: INTRAVENOUS | Status: DC

## 2024-10-09 MED ORDER — SODIUM CHLORIDE 0.9 % IV SOLN
240.0000 mg | Freq: Once | INTRAVENOUS | Status: AC
  Administered 2024-10-09: 240 mg via INTRAVENOUS
  Filled 2024-10-09: qty 24

## 2024-10-09 NOTE — Assessment & Plan Note (Addendum)
 On chronic anticoagulation for A-fib.  Continue to monitor for signs of GI bleeding or distress.

## 2024-10-09 NOTE — Patient Instructions (Addendum)
 [facility] - A DEPT OF [facility]  Discharge Instructions: Thank you for choosing [facility] to provide your oncology and hematology care.   If you have a lab appointment with the Cancer Center, please go directly to the Cancer Center and check in at the registration area.   Wear comfortable clothing and clothing appropriate for easy access to any Portacath or PICC line.   We strive to give you quality time with your provider. You may need to reschedule your appointment if you arrive late (15 or more minutes).  Arriving late affects you and other patients whose appointments are after yours.  Also, if you miss three or more appointments without notifying the office, you may be dismissed from the clinic at the provider's discretion.      For prescription refill requests, have your pharmacy contact our office and allow 72 hours for refills to be completed.    Today you received the following chemotherapy and/or immunotherapy agents: Nivolumab.       To help prevent nausea and vomiting after your treatment, we encourage you to take your nausea medication as directed.  BELOW ARE SYMPTOMS THAT SHOULD BE REPORTED IMMEDIATELY: *FEVER GREATER THAN 100.4 F (38 C) OR HIGHER *CHILLS OR SWEATING *NAUSEA AND VOMITING THAT IS NOT CONTROLLED WITH YOUR NAUSEA MEDICATION *UNUSUAL SHORTNESS OF BREATH *UNUSUAL BRUISING OR BLEEDING *URINARY PROBLEMS (pain or burning when urinating, or frequent urination) *BOWEL PROBLEMS (unusual diarrhea, constipation, pain near the anus) TENDERNESS IN MOUTH AND THROAT WITH OR WITHOUT PRESENCE OF ULCERS (sore throat, sores in mouth, or a toothache) UNUSUAL RASH, SWELLING OR PAIN  UNUSUAL VAGINAL DISCHARGE OR ITCHING   Items with * indicate a potential emergency and should be followed up as soon as possible or go to the Emergency Department if any problems should occur.  Please show the CHEMOTHERAPY ALERT CARD or  IMMUNOTHERAPY ALERT CARD at check-in to the Emergency Department and triage nurse.  Should you have questions after your visit or need to cancel or reschedule your appointment, please contact [facility] - A DE[facility]  Dept: [phone]  and follow the prompts.  Office hours are 8:00 a.m. to 4:30 p.m. Monday - Friday. Please note that voicemails left after 4:00 p.m. may not be returned until the following business day.  We are closed weekends and major holidays. You have access to a nurse at all times for urgent questions. Please call the main number to the clinic Dept: [phone] and follow the prompts.   For any non-urgent questions, you may also contact your provider using MyChart. We now offer e-Visits for anyone 21 and older to request care online for non-urgent symptoms. For details visit [email or web address].   Also download the MyChart app! Go to the app store, search "MyChart", open the app, select [place], and log in with your MyChart username and password.

## 2024-10-16 ENCOUNTER — Inpatient Hospital Stay

## 2024-10-21 ENCOUNTER — Other Ambulatory Visit

## 2024-10-23 ENCOUNTER — Inpatient Hospital Stay

## 2024-10-26 ENCOUNTER — Inpatient Hospital Stay

## 2024-10-28 ENCOUNTER — Ambulatory Visit: Admitting: Urology

## 2024-12-14 ENCOUNTER — Ambulatory Visit
